# Patient Record
Sex: Male | Born: 1935 | Race: White | Hispanic: No | Smoking: Former smoker
Health system: Southern US, Community
[De-identification: ages and names within clinical notes are randomized; demographics above are authoritative.]

## PROBLEM LIST (undated history)

## (undated) DIAGNOSIS — J42 Unspecified chronic bronchitis: Secondary | ICD-10-CM

## (undated) DIAGNOSIS — Z9989 Dependence on other enabling machines and devices: Secondary | ICD-10-CM

## (undated) DIAGNOSIS — Z9981 Dependence on supplemental oxygen: Secondary | ICD-10-CM

## (undated) DIAGNOSIS — G4733 Obstructive sleep apnea (adult) (pediatric): Secondary | ICD-10-CM

## (undated) DIAGNOSIS — I4819 Other persistent atrial fibrillation: Secondary | ICD-10-CM

## (undated) DIAGNOSIS — I10 Essential (primary) hypertension: Secondary | ICD-10-CM

## (undated) DIAGNOSIS — E162 Hypoglycemia, unspecified: Secondary | ICD-10-CM

## (undated) DIAGNOSIS — Z950 Presence of cardiac pacemaker: Secondary | ICD-10-CM

## (undated) DIAGNOSIS — J189 Pneumonia, unspecified organism: Secondary | ICD-10-CM

## (undated) DIAGNOSIS — I509 Heart failure, unspecified: Secondary | ICD-10-CM

## (undated) DIAGNOSIS — R278 Other lack of coordination: Secondary | ICD-10-CM

## (undated) DIAGNOSIS — C679 Malignant neoplasm of bladder, unspecified: Secondary | ICD-10-CM

## (undated) HISTORY — DX: Hypoglycemia, unspecified: E16.2

## (undated) HISTORY — DX: Essential (primary) hypertension: I10

## (undated) HISTORY — DX: Other lack of coordination: R27.8

## (undated) HISTORY — DX: Heart failure, unspecified: I50.9

## (undated) HISTORY — PX: BACK SURGERY: SHX140

---

## 1985-11-14 HISTORY — PX: LUMBAR DISC SURGERY: SHX700

## 1998-07-19 ENCOUNTER — Inpatient Hospital Stay (HOSPITAL_COMMUNITY): Admission: EM | Admit: 1998-07-19 | Discharge: 1998-07-20 | Payer: Self-pay | Admitting: *Deleted

## 2001-05-28 ENCOUNTER — Ambulatory Visit (HOSPITAL_COMMUNITY): Admission: RE | Admit: 2001-05-28 | Discharge: 2001-05-28 | Payer: Self-pay | Admitting: Internal Medicine

## 2001-05-28 ENCOUNTER — Encounter: Payer: Self-pay | Admitting: Internal Medicine

## 2003-01-30 ENCOUNTER — Emergency Department (HOSPITAL_COMMUNITY): Admission: EM | Admit: 2003-01-30 | Discharge: 2003-01-30 | Payer: Self-pay | Admitting: Emergency Medicine

## 2003-01-30 ENCOUNTER — Encounter: Payer: Self-pay | Admitting: Emergency Medicine

## 2003-03-06 ENCOUNTER — Encounter: Payer: Self-pay | Admitting: Cardiology

## 2003-03-06 ENCOUNTER — Ambulatory Visit (HOSPITAL_COMMUNITY): Admission: RE | Admit: 2003-03-06 | Discharge: 2003-03-06 | Payer: Self-pay | Admitting: Cardiology

## 2003-03-10 ENCOUNTER — Encounter: Payer: Self-pay | Admitting: Family Medicine

## 2003-03-10 ENCOUNTER — Ambulatory Visit (HOSPITAL_COMMUNITY): Admission: RE | Admit: 2003-03-10 | Discharge: 2003-03-10 | Payer: Self-pay | Admitting: Family Medicine

## 2003-06-18 ENCOUNTER — Encounter: Payer: Self-pay | Admitting: Family Medicine

## 2003-06-18 ENCOUNTER — Ambulatory Visit (HOSPITAL_COMMUNITY): Admission: RE | Admit: 2003-06-18 | Discharge: 2003-06-18 | Payer: Self-pay | Admitting: Family Medicine

## 2003-06-30 ENCOUNTER — Inpatient Hospital Stay (HOSPITAL_COMMUNITY): Admission: EM | Admit: 2003-06-30 | Discharge: 2003-07-01 | Payer: Self-pay | Admitting: Emergency Medicine

## 2003-06-30 ENCOUNTER — Encounter: Payer: Self-pay | Admitting: Emergency Medicine

## 2003-07-30 ENCOUNTER — Ambulatory Visit (HOSPITAL_COMMUNITY): Admission: RE | Admit: 2003-07-30 | Discharge: 2003-07-30 | Payer: Self-pay | Admitting: Family Medicine

## 2003-07-30 ENCOUNTER — Encounter: Payer: Self-pay | Admitting: Family Medicine

## 2004-04-06 ENCOUNTER — Ambulatory Visit (HOSPITAL_COMMUNITY): Admission: RE | Admit: 2004-04-06 | Discharge: 2004-04-06 | Payer: Self-pay | Admitting: Internal Medicine

## 2004-05-14 ENCOUNTER — Emergency Department (HOSPITAL_COMMUNITY): Admission: EM | Admit: 2004-05-14 | Discharge: 2004-05-14 | Payer: Self-pay | Admitting: Emergency Medicine

## 2004-06-06 ENCOUNTER — Emergency Department (HOSPITAL_COMMUNITY): Admission: EM | Admit: 2004-06-06 | Discharge: 2004-06-06 | Payer: Self-pay | Admitting: Emergency Medicine

## 2004-09-20 ENCOUNTER — Ambulatory Visit: Payer: Self-pay | Admitting: *Deleted

## 2004-10-20 ENCOUNTER — Ambulatory Visit: Payer: Self-pay | Admitting: *Deleted

## 2004-11-19 ENCOUNTER — Ambulatory Visit: Payer: Self-pay | Admitting: *Deleted

## 2004-12-17 ENCOUNTER — Ambulatory Visit: Payer: Self-pay | Admitting: Internal Medicine

## 2005-01-21 ENCOUNTER — Ambulatory Visit: Payer: Self-pay | Admitting: *Deleted

## 2005-02-21 ENCOUNTER — Ambulatory Visit: Payer: Self-pay | Admitting: *Deleted

## 2005-03-21 ENCOUNTER — Ambulatory Visit: Payer: Self-pay | Admitting: *Deleted

## 2005-04-19 ENCOUNTER — Ambulatory Visit: Payer: Self-pay | Admitting: Cardiology

## 2005-05-13 ENCOUNTER — Ambulatory Visit: Payer: Self-pay | Admitting: Cardiovascular Disease

## 2005-06-22 ENCOUNTER — Ambulatory Visit: Payer: Self-pay | Admitting: *Deleted

## 2005-07-21 ENCOUNTER — Ambulatory Visit: Payer: Self-pay | Admitting: *Deleted

## 2005-07-29 ENCOUNTER — Ambulatory Visit (HOSPITAL_COMMUNITY): Admission: RE | Admit: 2005-07-29 | Discharge: 2005-07-29 | Payer: Self-pay | Admitting: Family Medicine

## 2005-08-18 ENCOUNTER — Ambulatory Visit: Payer: Self-pay | Admitting: *Deleted

## 2005-08-22 ENCOUNTER — Ambulatory Visit (HOSPITAL_COMMUNITY): Admission: RE | Admit: 2005-08-22 | Discharge: 2005-08-22 | Payer: Self-pay | Admitting: Family Medicine

## 2005-09-15 ENCOUNTER — Ambulatory Visit: Payer: Self-pay | Admitting: *Deleted

## 2005-09-19 ENCOUNTER — Ambulatory Visit: Payer: Self-pay | Admitting: Orthopedic Surgery

## 2005-10-17 ENCOUNTER — Ambulatory Visit: Payer: Self-pay | Admitting: Cardiology

## 2005-11-02 ENCOUNTER — Ambulatory Visit (HOSPITAL_COMMUNITY): Admission: RE | Admit: 2005-11-02 | Discharge: 2005-11-02 | Payer: Self-pay | Admitting: Family Medicine

## 2005-12-05 ENCOUNTER — Ambulatory Visit: Payer: Self-pay | Admitting: *Deleted

## 2005-12-13 ENCOUNTER — Emergency Department (HOSPITAL_COMMUNITY): Admission: EM | Admit: 2005-12-13 | Discharge: 2005-12-13 | Payer: Self-pay | Admitting: Emergency Medicine

## 2006-01-09 ENCOUNTER — Ambulatory Visit: Payer: Self-pay | Admitting: *Deleted

## 2006-02-06 ENCOUNTER — Ambulatory Visit: Payer: Self-pay | Admitting: *Deleted

## 2006-03-13 ENCOUNTER — Ambulatory Visit: Payer: Self-pay | Admitting: Cardiology

## 2006-04-14 ENCOUNTER — Ambulatory Visit: Payer: Self-pay | Admitting: *Deleted

## 2006-04-18 ENCOUNTER — Ambulatory Visit (HOSPITAL_COMMUNITY): Admission: RE | Admit: 2006-04-18 | Discharge: 2006-04-18 | Payer: Self-pay | Admitting: Cardiology

## 2006-04-19 ENCOUNTER — Ambulatory Visit: Payer: Self-pay | Admitting: Cardiology

## 2006-05-19 ENCOUNTER — Ambulatory Visit: Payer: Self-pay | Admitting: Cardiology

## 2006-06-21 ENCOUNTER — Ambulatory Visit: Payer: Self-pay | Admitting: *Deleted

## 2006-07-26 ENCOUNTER — Ambulatory Visit: Payer: Self-pay | Admitting: Cardiology

## 2006-08-10 ENCOUNTER — Ambulatory Visit: Payer: Self-pay | Admitting: Cardiology

## 2006-09-01 ENCOUNTER — Ambulatory Visit: Payer: Self-pay | Admitting: Cardiology

## 2006-10-03 ENCOUNTER — Ambulatory Visit: Payer: Self-pay | Admitting: Cardiology

## 2006-10-16 ENCOUNTER — Ambulatory Visit: Payer: Self-pay | Admitting: Cardiology

## 2006-11-03 ENCOUNTER — Ambulatory Visit: Payer: Self-pay | Admitting: Cardiology

## 2006-11-16 ENCOUNTER — Ambulatory Visit: Payer: Self-pay | Admitting: Cardiology

## 2006-12-18 ENCOUNTER — Ambulatory Visit: Payer: Self-pay | Admitting: Cardiology

## 2007-01-15 ENCOUNTER — Ambulatory Visit: Payer: Self-pay | Admitting: Cardiology

## 2007-01-23 ENCOUNTER — Ambulatory Visit: Payer: Self-pay | Admitting: Cardiology

## 2007-01-31 ENCOUNTER — Ambulatory Visit: Payer: Self-pay | Admitting: Cardiology

## 2007-02-07 ENCOUNTER — Ambulatory Visit: Payer: Self-pay | Admitting: Cardiology

## 2007-02-07 LAB — CONVERTED CEMR LAB
ALT: 18 units/L (ref 0–40)
AST: 23 units/L (ref 0–37)
Albumin: 3.6 g/dL (ref 3.5–5.2)
Calcium: 8.9 mg/dL (ref 8.4–10.5)
Chloride: 103 meq/L (ref 96–112)
Free T4: 0.9 ng/dL (ref 0.6–1.6)
GFR calc Af Amer: 76 mL/min
GFR calc non Af Amer: 63 mL/min
TSH: 2.56 microintl units/mL (ref 0.35–5.50)

## 2007-02-14 ENCOUNTER — Ambulatory Visit: Payer: Self-pay | Admitting: *Deleted

## 2007-02-23 ENCOUNTER — Ambulatory Visit: Payer: Self-pay | Admitting: Cardiology

## 2007-02-23 LAB — CONVERTED CEMR LAB
BUN: 16 mg/dL (ref 6–23)
CO2: 31 meq/L (ref 19–32)
Calcium: 8.8 mg/dL (ref 8.4–10.5)
Creatinine, Ser: 1.3 mg/dL (ref 0.4–1.5)
GFR calc Af Amer: 69 mL/min

## 2007-03-22 ENCOUNTER — Ambulatory Visit: Payer: Self-pay | Admitting: Cardiology

## 2007-04-11 ENCOUNTER — Ambulatory Visit: Payer: Self-pay | Admitting: Cardiology

## 2007-04-25 ENCOUNTER — Ambulatory Visit: Payer: Self-pay | Admitting: Cardiology

## 2007-05-30 ENCOUNTER — Ambulatory Visit: Payer: Self-pay | Admitting: Cardiology

## 2007-06-06 ENCOUNTER — Ambulatory Visit: Payer: Self-pay | Admitting: Cardiovascular Disease

## 2007-07-04 ENCOUNTER — Ambulatory Visit: Payer: Self-pay | Admitting: Cardiology

## 2007-07-17 ENCOUNTER — Ambulatory Visit: Payer: Self-pay | Admitting: Cardiovascular Disease

## 2007-08-07 ENCOUNTER — Ambulatory Visit: Payer: Self-pay | Admitting: Cardiology

## 2007-09-05 ENCOUNTER — Ambulatory Visit: Payer: Self-pay | Admitting: Cardiology

## 2007-09-07 ENCOUNTER — Ambulatory Visit: Payer: Self-pay | Admitting: Cardiology

## 2007-09-07 LAB — CONVERTED CEMR LAB
BUN: 16 mg/dL (ref 6–23)
Calcium: 8.9 mg/dL (ref 8.4–10.5)
Chloride: 105 meq/L (ref 96–112)
Free T4: 0.9 ng/dL (ref 0.6–1.6)
GFR calc Af Amer: 76 mL/min
GFR calc non Af Amer: 63 mL/min
Glucose, Bld: 81 mg/dL (ref 70–99)
TSH: 2.9 microintl units/mL (ref 0.35–5.50)

## 2007-09-19 ENCOUNTER — Ambulatory Visit: Payer: Self-pay | Admitting: Cardiology

## 2007-10-05 ENCOUNTER — Ambulatory Visit: Payer: Self-pay | Admitting: Cardiology

## 2007-11-02 ENCOUNTER — Ambulatory Visit: Payer: Self-pay | Admitting: Cardiology

## 2007-11-15 HISTORY — PX: EYE SURGERY: SHX253

## 2007-11-30 ENCOUNTER — Ambulatory Visit: Payer: Self-pay | Admitting: Internal Medicine

## 2007-12-21 ENCOUNTER — Ambulatory Visit: Payer: Self-pay | Admitting: Cardiology

## 2008-01-18 ENCOUNTER — Ambulatory Visit: Payer: Self-pay | Admitting: Cardiology

## 2008-02-19 ENCOUNTER — Ambulatory Visit: Payer: Self-pay | Admitting: Cardiology

## 2008-03-19 ENCOUNTER — Ambulatory Visit: Payer: Self-pay | Admitting: Cardiology

## 2008-04-16 ENCOUNTER — Ambulatory Visit: Payer: Self-pay | Admitting: Cardiology

## 2008-05-14 ENCOUNTER — Ambulatory Visit: Payer: Self-pay | Admitting: Cardiovascular Disease

## 2008-06-16 ENCOUNTER — Ambulatory Visit: Payer: Self-pay | Admitting: Cardiology

## 2008-07-11 ENCOUNTER — Ambulatory Visit: Payer: Self-pay | Admitting: Cardiology

## 2008-07-11 LAB — CONVERTED CEMR LAB: Prothrombin Time: 23 s — ABNORMAL HIGH (ref 10.9–13.3)

## 2008-08-18 ENCOUNTER — Ambulatory Visit: Payer: Self-pay | Admitting: Cardiology

## 2008-09-01 ENCOUNTER — Ambulatory Visit: Payer: Self-pay | Admitting: Cardiology

## 2008-09-01 ENCOUNTER — Ambulatory Visit (HOSPITAL_COMMUNITY): Admission: RE | Admit: 2008-09-01 | Discharge: 2008-09-01 | Payer: Self-pay | Admitting: Family Medicine

## 2008-10-02 ENCOUNTER — Ambulatory Visit: Payer: Self-pay | Admitting: Cardiology

## 2008-10-23 ENCOUNTER — Ambulatory Visit: Payer: Self-pay | Admitting: Cardiology

## 2008-11-14 DIAGNOSIS — C679 Malignant neoplasm of bladder, unspecified: Secondary | ICD-10-CM

## 2008-11-14 HISTORY — DX: Malignant neoplasm of bladder, unspecified: C67.9

## 2008-11-20 ENCOUNTER — Ambulatory Visit: Payer: Self-pay | Admitting: Cardiology

## 2009-01-02 ENCOUNTER — Ambulatory Visit: Payer: Self-pay | Admitting: Cardiology

## 2009-01-29 ENCOUNTER — Ambulatory Visit: Payer: Self-pay | Admitting: Cardiology

## 2009-02-26 ENCOUNTER — Ambulatory Visit: Payer: Self-pay | Admitting: Cardiology

## 2009-03-19 ENCOUNTER — Ambulatory Visit: Payer: Self-pay | Admitting: Cardiology

## 2009-04-15 ENCOUNTER — Ambulatory Visit: Payer: Self-pay | Admitting: Cardiology

## 2009-04-24 ENCOUNTER — Ambulatory Visit (HOSPITAL_COMMUNITY): Admission: RE | Admit: 2009-04-24 | Discharge: 2009-04-24 | Payer: Self-pay | Admitting: Family Medicine

## 2009-05-21 ENCOUNTER — Ambulatory Visit: Payer: Self-pay | Admitting: Cardiology

## 2009-06-24 ENCOUNTER — Ambulatory Visit: Payer: Self-pay | Admitting: Cardiology

## 2009-06-29 ENCOUNTER — Encounter: Payer: Self-pay | Admitting: *Deleted

## 2009-07-01 ENCOUNTER — Encounter: Payer: Self-pay | Admitting: Cardiology

## 2009-07-02 ENCOUNTER — Telehealth: Payer: Self-pay | Admitting: Cardiology

## 2009-07-02 ENCOUNTER — Ambulatory Visit: Payer: Self-pay | Admitting: Cardiology

## 2009-07-02 DIAGNOSIS — I4819 Other persistent atrial fibrillation: Secondary | ICD-10-CM | POA: Insufficient documentation

## 2009-07-02 DIAGNOSIS — E783 Hyperchylomicronemia: Secondary | ICD-10-CM | POA: Insufficient documentation

## 2009-07-02 DIAGNOSIS — E162 Hypoglycemia, unspecified: Secondary | ICD-10-CM | POA: Insufficient documentation

## 2009-07-02 DIAGNOSIS — I10 Essential (primary) hypertension: Secondary | ICD-10-CM | POA: Insufficient documentation

## 2009-07-06 LAB — CONVERTED CEMR LAB
AST: 19 units/L (ref 0–37)
Albumin: 3.8 g/dL (ref 3.5–5.2)
Alkaline Phosphatase: 54 units/L (ref 39–117)
BUN: 16 mg/dL (ref 6–23)
Basophils Absolute: 0 10*3/uL (ref 0.0–0.1)
CO2: 31 meq/L (ref 19–32)
Calcium: 9.1 mg/dL (ref 8.4–10.5)
Creatinine, Ser: 1.3 mg/dL (ref 0.4–1.5)
Eosinophils Absolute: 0.3 10*3/uL (ref 0.0–0.7)
GFR calc non Af Amer: 56.67 mL/min (ref >60)
Glucose, Bld: 84 mg/dL (ref 70–99)
Lymphocytes Relative: 25.7 % (ref 12.0–46.0)
MCHC: 33.5 g/dL (ref 30.0–36.0)
Monocytes Relative: 13.2 % — ABNORMAL HIGH (ref 3.0–12.0)
Neutrophils Relative %: 56.7 % (ref 43.0–77.0)
Platelets: 194 10*3/uL (ref 150.0–400.0)
RDW: 14 % (ref 11.5–14.6)
Sodium: 138 meq/L (ref 135–145)
Total Bilirubin: 1 mg/dL (ref 0.3–1.2)

## 2009-07-10 ENCOUNTER — Ambulatory Visit (HOSPITAL_BASED_OUTPATIENT_CLINIC_OR_DEPARTMENT_OTHER): Admission: RE | Admit: 2009-07-10 | Discharge: 2009-07-10 | Payer: Self-pay | Admitting: Urology

## 2009-07-10 ENCOUNTER — Encounter (INDEPENDENT_AMBULATORY_CARE_PROVIDER_SITE_OTHER): Payer: Self-pay | Admitting: Urology

## 2009-07-10 HISTORY — PX: TRANSURETHRAL RESECTION OF BLADDER TUMOR WITH GYRUS (TURBT-GYRUS): SHX6458

## 2009-07-17 ENCOUNTER — Inpatient Hospital Stay (HOSPITAL_COMMUNITY): Admission: EM | Admit: 2009-07-17 | Discharge: 2009-07-24 | Payer: Self-pay | Admitting: Emergency Medicine

## 2009-07-23 ENCOUNTER — Telehealth: Payer: Self-pay | Admitting: Cardiology

## 2009-07-27 ENCOUNTER — Ambulatory Visit: Payer: Self-pay

## 2009-07-27 LAB — CONVERTED CEMR LAB: POC INR: 2.7

## 2009-07-30 ENCOUNTER — Ambulatory Visit: Payer: Self-pay | Admitting: Cardiology

## 2009-07-30 LAB — CONVERTED CEMR LAB: POC INR: 2.2

## 2009-08-03 ENCOUNTER — Ambulatory Visit: Payer: Self-pay | Admitting: Cardiology

## 2009-08-03 LAB — CONVERTED CEMR LAB: POC INR: 1.6

## 2009-08-10 ENCOUNTER — Ambulatory Visit: Payer: Self-pay | Admitting: Cardiology

## 2009-08-17 ENCOUNTER — Ambulatory Visit: Payer: Self-pay | Admitting: Cardiology

## 2009-08-17 LAB — CONVERTED CEMR LAB: POC INR: 2.3

## 2009-09-07 ENCOUNTER — Ambulatory Visit: Payer: Self-pay | Admitting: Cardiology

## 2009-09-07 LAB — CONVERTED CEMR LAB: POC INR: 2.4

## 2009-09-21 ENCOUNTER — Ambulatory Visit: Payer: Self-pay | Admitting: Cardiology

## 2009-10-05 ENCOUNTER — Ambulatory Visit: Payer: Self-pay | Admitting: Cardiology

## 2009-10-16 ENCOUNTER — Encounter (INDEPENDENT_AMBULATORY_CARE_PROVIDER_SITE_OTHER): Payer: Self-pay | Admitting: *Deleted

## 2009-10-16 ENCOUNTER — Encounter: Payer: Self-pay | Admitting: Cardiology

## 2009-10-21 ENCOUNTER — Ambulatory Visit: Payer: Self-pay | Admitting: Cardiovascular Disease

## 2009-10-21 LAB — CONVERTED CEMR LAB: POC INR: 2.2

## 2009-11-06 ENCOUNTER — Emergency Department (HOSPITAL_COMMUNITY): Admission: EM | Admit: 2009-11-06 | Discharge: 2009-11-06 | Payer: Self-pay | Admitting: Emergency Medicine

## 2009-11-19 ENCOUNTER — Ambulatory Visit: Payer: Self-pay | Admitting: Cardiology

## 2009-11-19 LAB — CONVERTED CEMR LAB: POC INR: 2.3

## 2009-11-27 ENCOUNTER — Ambulatory Visit (HOSPITAL_BASED_OUTPATIENT_CLINIC_OR_DEPARTMENT_OTHER): Admission: RE | Admit: 2009-11-27 | Discharge: 2009-11-27 | Payer: Self-pay | Admitting: Urology

## 2009-12-07 ENCOUNTER — Ambulatory Visit: Payer: Self-pay | Admitting: Cardiology

## 2009-12-28 ENCOUNTER — Ambulatory Visit: Payer: Self-pay | Admitting: Cardiology

## 2010-01-25 ENCOUNTER — Ambulatory Visit: Payer: Self-pay | Admitting: Cardiology

## 2010-01-27 ENCOUNTER — Ambulatory Visit: Payer: Self-pay | Admitting: Cardiology

## 2010-01-28 ENCOUNTER — Encounter: Payer: Self-pay | Admitting: Cardiology

## 2010-02-01 ENCOUNTER — Encounter (INDEPENDENT_AMBULATORY_CARE_PROVIDER_SITE_OTHER): Payer: Self-pay | Admitting: *Deleted

## 2010-02-09 LAB — CONVERTED CEMR LAB
Albumin: 4.2 g/dL (ref 3.5–5.2)
Alkaline Phosphatase: 67 units/L (ref 39–117)
BUN: 17 mg/dL (ref 6–23)
Basophils Absolute: 0 10*3/uL (ref 0.0–0.1)
CO2: 25 meq/L (ref 19–32)
Calcium: 9.2 mg/dL (ref 8.4–10.5)
Eosinophils Relative: 3 % (ref 0–5)
Free T4: 1.11 ng/dL (ref 0.80–1.80)
Glucose, Bld: 105 mg/dL — ABNORMAL HIGH (ref 70–99)
HCT: 46.3 % (ref 39.0–52.0)
Hemoglobin: 15.4 g/dL (ref 13.0–17.0)
Lymphocytes Relative: 24 % (ref 12–46)
MCHC: 33.3 g/dL (ref 30.0–36.0)
Monocytes Absolute: 0.7 10*3/uL (ref 0.1–1.0)
Monocytes Relative: 10 % (ref 3–12)
Potassium: 4.3 meq/L (ref 3.5–5.3)
RBC: 5.35 M/uL (ref 4.22–5.81)
RDW: 16.4 % — ABNORMAL HIGH (ref 11.5–15.5)
Sodium: 137 meq/L (ref 135–145)
TSH: 2.385 microintl units/mL (ref 0.350–4.500)
Total Protein: 6.7 g/dL (ref 6.0–8.3)

## 2010-02-15 ENCOUNTER — Ambulatory Visit: Payer: Self-pay | Admitting: Cardiology

## 2010-02-15 LAB — CONVERTED CEMR LAB: POC INR: 2

## 2010-03-15 ENCOUNTER — Ambulatory Visit: Payer: Self-pay | Admitting: Cardiology

## 2010-04-14 ENCOUNTER — Ambulatory Visit: Payer: Self-pay | Admitting: Cardiology

## 2010-05-13 ENCOUNTER — Encounter (INDEPENDENT_AMBULATORY_CARE_PROVIDER_SITE_OTHER): Payer: Self-pay | Admitting: *Deleted

## 2010-05-20 ENCOUNTER — Ambulatory Visit: Payer: Self-pay | Admitting: Cardiology

## 2010-05-20 LAB — CONVERTED CEMR LAB: POC INR: 2.5

## 2010-06-10 ENCOUNTER — Encounter (INDEPENDENT_AMBULATORY_CARE_PROVIDER_SITE_OTHER): Payer: Self-pay

## 2010-06-10 LAB — CONVERTED CEMR LAB
ALT: 16 units/L
BUN: 16 mg/dL
Calcium: 9.1 mg/dL
Cholesterol: 179 mg/dL
Creatinine, Ser: 1.27 mg/dL
GFR calc non Af Amer: 55 mL/min
Glomerular Filtration Rate, Af Am: >60 mL/min/{1.73_m2}
Glucose, Bld: 119 mg/dL
HCT: 45.7 %
HDL: 46 mg/dL
MCV: 88.2 fL
Platelets: 272 10*3/uL
Potassium: 4.4 meq/L
Triglycerides: 75 mg/dL

## 2010-06-17 ENCOUNTER — Ambulatory Visit: Payer: Self-pay | Admitting: Cardiology

## 2010-06-17 LAB — CONVERTED CEMR LAB: POC INR: 2.6

## 2010-07-08 ENCOUNTER — Ambulatory Visit: Payer: Self-pay | Admitting: Cardiology

## 2010-07-15 ENCOUNTER — Ambulatory Visit: Payer: Self-pay | Admitting: Cardiology

## 2010-07-15 LAB — CONVERTED CEMR LAB: POC INR: 2.8

## 2010-08-12 ENCOUNTER — Ambulatory Visit: Payer: Self-pay | Admitting: Cardiology

## 2010-09-16 ENCOUNTER — Ambulatory Visit: Payer: Self-pay | Admitting: Cardiology

## 2010-09-16 LAB — CONVERTED CEMR LAB: POC INR: 2.6

## 2010-09-29 ENCOUNTER — Ambulatory Visit (HOSPITAL_COMMUNITY): Admission: RE | Admit: 2010-09-29 | Discharge: 2010-09-29 | Payer: Self-pay | Admitting: Internal Medicine

## 2010-10-14 ENCOUNTER — Ambulatory Visit: Payer: Self-pay | Admitting: Cardiology

## 2010-10-14 LAB — CONVERTED CEMR LAB: POC INR: 3.7

## 2010-10-22 ENCOUNTER — Emergency Department (HOSPITAL_COMMUNITY)
Admission: EM | Admit: 2010-10-22 | Discharge: 2010-10-22 | Disposition: A | Discharge status: Other Acute Inpatient Hospital | Payer: Self-pay | Source: Home / Self Care | Admitting: Emergency Medicine

## 2010-11-11 ENCOUNTER — Ambulatory Visit: Payer: Self-pay | Admitting: Cardiology

## 2010-11-11 LAB — CONVERTED CEMR LAB: POC INR: 2.6

## 2010-12-09 ENCOUNTER — Ambulatory Visit: Admission: RE | Admit: 2010-12-09 | Discharge: 2010-12-09 | Payer: Self-pay | Source: Home / Self Care

## 2010-12-09 LAB — CONVERTED CEMR LAB: POC INR: 2.4

## 2010-12-16 NOTE — Medication Information (Signed)
Summary: ccr-lr  Anticoagulant Therapy  Managed by: Vashti Hey, RN PCP: Estanislado Spire Supervising MD: Diona Browner MD, Remi Deter Indication 1: Atrial Fibrillation (ICD-427.31) Lab Used: [facility] [place] Site: [place] INR POC 2.6  Dietary changes: no    Health status changes: no    Bleeding/hemorrhagic complications: no    Recent/future hospitalizations: no    Any changes in medication regimen? no    Recent/future dental: no  Any missed doses?: no       Is patient compliant with meds? yes       Allergies: 1)  ! Lidocaine 2)  ! Penicillin 3)  ! * Tetanus 4)  ! * Decadron 5)  ! * Meprobamate 6)  ! * Decongestants  Anticoagulation Management History:      Positive risk factors for bleeding include an age of 40 years or older.  The bleeding index is 'intermediate risk'.  Positive CHADS2 values include History of HTN and Age > 22 years old.  The start date was 05/14/2004.  His last INR was 2.1 RATIO.  Anticoagulation responsible provider: Diona Browner MD, Remi Deter.  INR POC: 2.6.  Exp: 10/11.    Anticoagulation Management Assessment/Plan:      The patient's current anticoagulation dose is Coumadin 5 mg tabs: as directed by CVRR.  The target INR is 2 - 3.  The next INR is due 07/15/2010.  Anticoagulation instructions were given to patient.  Results were reviewed/authorized by Vashti Hey, RN.  He was notified by Vashti Hey RN.         Prior Anticoagulation Instructions: INR 2.5 Continue coumadin 2.5mg  once daily   Current Anticoagulation Instructions: INR 2.6 Continue coumadin 2.5mg  once daily

## 2010-12-16 NOTE — Letter (Signed)
Summary: [place] [facility], agricultural at [facility]  [address] [place] [address]   Phone: [phone]  Fax: [phone]      February 01, 2010 MRN: [id number]   SAHAS SLUKA [address] [place], [place]  [address]   Dear Mr. Remus,  Your test ordered by Selena Batten has been reviewed by your physician (or physician assistant) and was found to be normal or stable. Your physician (or physician assistant) felt no changes were needed at this time.  ____ Echocardiogram  ____ Cardiac Stress Test  __X__ Lab Work  ____ Peripheral vascular study of arms, legs or neck  ____ CT scan or X-ray  ____ Lung or Breathing test  ____ Other:  No change in medical treatment at this time, per Dr. wall.  Enclosed is a copy of your labwork for your records.   Thank you, Clearence Vitug Allyne Gee RN    Campbell Bing, MD, Lenise Arena.C.Gaylord Shih, MD, F.A.C.C Lewayne Bunting, MD, F.A.C.C Nona Dell, MD, F.A.C.C Charlton Haws, MD, Lenise Arena.C.C

## 2010-12-16 NOTE — Assessment & Plan Note (Signed)
Summary: 6 mth f/u per checkout on 01/27/10/tg      Allergies Added:   Visit Type:  Follow-up Primary Provider:  [name]  CC:  no cardiology complaints.  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 He denies any symptoms of clinically twisted. He continues on low dose amiodarone which he tolerated well.  Recent blood work has been reviewed and was within normal limits. I do not see where her thyroid function was checked though he said he had blood work done last week by Dr. Phillips Odor. He was hospitalized in May of this year with diverticulitis. Chest x-ray at that time demonstrated some chronic bronchitic changes but no infiltrates or effusion.  He denies any dyspnea cough or symptoms of thyroid disease. His weight has been slowly going up. He enjoys eating. His blood pressures and running around 130/80. His hemoglobin A1c has been relatively good at 6.1-6.3%.  He did not take his blood pressure medicine this morning. He checks it and says it runs about 130/60.  Current Medications (verified): 1)  Amiodarone Hcl 200 Mg Tabs (Amiodarone Hcl) .Marland Kitchen.. 1 Tab Once Daily 2)  Diovan 160 Mg Tabs (Valsartan) .Marland Kitchen.. 1 Tab Two Times A Day 3)  Aspirin Ec 325 Mg Tbec (Aspirin) .... Take One Tablet By Mouth Daily 4)  Coumadin 5 Mg Tabs (Warfarin Sodium) .... As Directed By Cvrr 5)  Lexapro 10 Mg Tabs (Escitalopram Oxalate) .Marland Kitchen.. 1 Tab Once Daily 6)  Centrum Silver  Tabs (Multiple Vitamins-Minerals) .Marland Kitchen.. 1 Tab Once Daily 7)  Vitamin D 1000 Unit Tabs (Cholecalciferol) .Marland Kitchen.. 1 Tab Two Times A Day 8)  Calcium-Magnesium 500-250 Mg Tabs (Calcium-Magnesium) .Marland Kitchen.. 1 Tab Once Daily 9)  Vitamin B-12 100 Mcg Tabs (Cyanocobalamin) .Marland Kitchen.. 1 Tab Once Daily 10)  Coq10 100 Mg Caps (Coenzyme Q10) .... Take 1 Tab Daily  Allergies (verified): 1)  ! Lidocaine 2)  ! Penicillin 3)  ! * Tetanus 4)  ! * Decadron 5)  ! * Meprobamate 6)  ! 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  He has one brother and six  sisters, none of whom have coronary artery disease.  Social History: Last updated: 07/28/09   He lives in [place] with his wife, he is retired, he is  married, has been for quite some time.  He used to smoke but quit 40 years  ago, has about a 15 pack-year smoking history, does not drink, does not use  illicit drugs.  Review of Systems       negative other than history of present illness  Vital Signs:  Patient profile:   77 year old male Weight:      256 pounds BMI:     36.86 Pulse rate:   74 / minute BP sitting:   160 / 82  (right arm)  Vitals Entered By: Dreama Saa, CNA (July 08, 2010 9:41 AM)  Physical Exam  General:  obese.  no acute distress Head:  normocephalic and atraumatic Eyes:  PERRLA/EOM intact; conjunctiva and lids normal. Neck:  Neck supple, no JVD. No masses, thyromegaly or abnormal cervical nodes. Chest Midas Daughety:  no deformities or breast masses noted  Lungs:  Clear bilaterally to auscultation and percussion. Heart:  PMI poorly appreciated, regular rate and rhythm, no gallop Pulses:  pulses normal in all 4 extremities Extremities:  trace left pedal edema and trace right pedal edema.   Neurologic:  Alert and oriented x 3. Skin:  Intact without lesions or rashes. Psych:  Normal affect.   Impression & Recommendations:  Problem # 1:  PAROXYSMAL ATRIAL FIBRILLATION (ICD-427.31) Assessment Improved he That is doing well on low dose amiodarone. I will see him back in 6 months. His updated medication  list for this problem includes:    Amiodarone Hcl 200 Mg Tabs (Amiodarone hcl) .Marland Kitchen... 1 tab once daily    Aspirin Ec 325 Mg Tbec (Aspirin) .Marland Kitchen... Take one tablet by mouth daily    Coumadin 5 Mg Tabs (Warfarin sodium) .Marland Kitchen... As directed by cvrr  Problem # 2:  HYPERTENSION (ICD-401.9) Assessment: Unchanged  His updated medication list for this problem includes:    Diovan 160 Mg Tabs (Valsartan) .Marland Kitchen... 1 tab two times a day    Aspirin Ec 325 Mg Tbec (Aspirin) .Marland Kitchen... Take one tablet by mouth daily  Orders: EKG w/ Interpretation (93000)  Problem # 3:  ENCOUNTER FOR LONG-TERM USE OF OTHER MEDICATIONS (ICD-V58.69)  Patient Instructions: 1)  Your physician recommends that you schedule a follow-up appointment in: 6 months 2)  Your physician recommends that you continue on your current medications as directed. Please refer to the Current Medication list given to you today.  Appended Document: [facility]      Allergies: 1)  ! Lidocaine 2)  ! Penicillin 3)  ! * Tetanus 4)  ! * Decadron 5)  ! * Meprobamate 6)  ! * Decongestants   EKG  Procedure date:  07/08/2010  Findings:      normal sinus rhythm, no acute changes, QTC 403 ms.

## 2010-12-16 NOTE — Medication Information (Signed)
Summary: ccr-lr  Anticoagulant Therapy  Managed by: Vashti Hey, RN PCP: [name] Supervising MD: Dietrich Pates MD, Molly Maduro Indication 1: Atrial Fibrillation (ICD-427.31) Lab Used: [facility] [place] Site: [place] INR POC 2.6  Dietary changes: no    Health status changes: no    Bleeding/hemorrhagic complications: no    Recent/future hospitalizations: no    Any changes in medication regimen? no    Recent/future dental: no  Any missed doses?: no       Is patient compliant with meds? yes       Allergies: 1)  ! Lidocaine 2)  ! Penicillin 3)  ! * Tetanus 4)  ! * Decadron 5)  ! * Meprobamate 6)  ! * Decongestants  Anticoagulation Management History:      The patient is taking warfarin and comes in today for a routine follow up visit.  Positive risk factors for bleeding include an age of 75 years or older.  The bleeding index is 'intermediate risk'.  Positive CHADS2 values include History of HTN and Age > 30 years old.  The start date was 05/14/2004.  His last INR was 2.1 RATIO.  Anticoagulation responsible provider: Dietrich Pates MD, Molly Maduro.  INR POC: 2.6.  Cuvette Lot#: [id number].  Exp: 10/11.    Anticoagulation Management Assessment/Plan:      The patient's current anticoagulation dose is Coumadin 5 mg tabs: as directed by CVRR.  The target INR is 2 - 3.  The next INR is due 10/14/2010.  Anticoagulation instructions were given to patient.  Results were reviewed/authorized by Vashti Hey, RN.  He was notified by Vashti Hey RN.         Prior Anticoagulation Instructions: INR 2.1 Continue coumadin 2.5mg  once daily   Current Anticoagulation Instructions: INR 2.6 Continue coumadin 2.5mg  once daily

## 2010-12-16 NOTE — Medication Information (Signed)
Summary: ccr-lr  Anticoagulant Therapy  Managed by: Vashti Hey, RN PCP: Estanislado Spire Supervising MD: Dietrich Pates MD, Molly Maduro Indication 1: Atrial Fibrillation (ICD-427.31) Lab Used: [facility] [place] Site: [place] INR POC 2.2  Dietary changes: no    Health status changes: no    Bleeding/hemorrhagic complications: no    Recent/future hospitalizations: no    Any changes in medication regimen? yes       Details: On antibiotic for sinus infection  Recent/future dental: no  Any missed doses?: no       Is patient compliant with meds? yes       Allergies: 1)  ! Lidocaine 2)  ! Penicillin 3)  ! * Tetanus 4)  ! * Decadron 5)  ! * Meprobamate 6)  ! * Decongestants  Anticoagulation Management History:      The patient is taking warfarin and comes in today for a routine follow up visit.  Positive risk factors for bleeding include an age of 29 years or older.  The bleeding index is 'intermediate risk'.  Positive CHADS2 values include History of HTN and Age > 61 years old.  The start date was 05/14/2004.  His last INR was 2.1 RATIO.  Anticoagulation responsible provider: Dietrich Pates MD, Molly Maduro.  INR POC: 2.2.  Cuvette Lot#: [id number].  Exp: 10/11.    Anticoagulation Management Assessment/Plan:      The patient's current anticoagulation dose is Coumadin 5 mg tabs: as directed by CVRR.  The target INR is 2 - 3.  The next INR is due 02/01/2010.  Anticoagulation instructions were given to patient.  Results were reviewed/authorized by Vashti Hey, RN.  He was notified by Vashti Hey RN.         Prior Anticoagulation Instructions: INR 2.5 Continue coumadn 2.5mg  once daily   Current Anticoagulation Instructions: INR 2.2 Continue coumadin 2.5mg  once daily

## 2010-12-16 NOTE — Medication Information (Signed)
Summary: ccr-lr  Anticoagulant Therapy  Managed by: Chad [name], Chad [name] PCP: [name] Supervising MD: Dietrich Pates MD, Molly Maduro Indication 1: Atrial Fibrillation (ICD-427.31) Lab Used: [facility] [place] Site: [place] INR POC 2.4  Dietary changes: no    Health status changes: no    Bleeding/hemorrhagic complications: no    Recent/future hospitalizations: no    Any changes in medication regimen? no    Recent/future dental: no  Any missed doses?: no       Is patient compliant with meds? yes       Allergies: 1)  ! Lidocaine 2)  ! Penicillin 3)  ! * Tetanus 4)  ! * Decadron 5)  ! * Meprobamate 6)  ! * Decongestants  Anticoagulation Management History:      The patient is taking warfarin and comes in today for a routine follow up visit.  Positive risk factors for bleeding include an age of 15 years or older.  The bleeding index is 'intermediate risk'.  Positive CHADS2 values include History of HTN and Age > 72 years old.  The start date was 05/14/2004.  His last INR was 2.1 RATIO.  Anticoagulation responsible provider: Dietrich Pates MD, Molly Maduro.  INR POC: 2.4.  Cuvette Lot#: [id number].  Exp: 10/11.    Anticoagulation Management Assessment/Plan:      The patient's current anticoagulation dose is Coumadin 5 mg tabs: as directed by CVRR.  The target INR is 2 - 3.  The next INR is due 01/06/2011.  Anticoagulation instructions were given to patient.  Results were reviewed/authorized by Chad [name], Chad [name].  He was notified by Chad [name] Chad [name].         Prior Anticoagulation Instructions: INR 2.6 Continue coumadin 2.5mg  once daily   Current Anticoagulation Instructions: INR 2.4 Continue coumadin 2.5mg  once daily

## 2010-12-16 NOTE — Medication Information (Signed)
Summary: ccr-lr  Anticoagulant Therapy  Managed by: Vashti Hey, RN PCP: Estanislado Spire Supervising MD: Dietrich Pates MD, Molly Maduro Indication 1: Atrial Fibrillation (ICD-427.31) Lab Used: [facility] [place] Site: [place] INR POC 2.5  Dietary changes: no    Health status changes: no    Bleeding/hemorrhagic complications: no    Recent/future hospitalizations: no    Any changes in medication regimen? no    Recent/future dental: no  Any missed doses?: no       Is patient compliant with meds? yes       Allergies: 1)  ! Lidocaine 2)  ! Penicillin 3)  ! * Tetanus 4)  ! * Decadron 5)  ! * Meprobamate 6)  ! * Decongestants  Anticoagulation Management History:      The patient is taking warfarin and comes in today for a routine follow up visit.  Positive risk factors for bleeding include an age of 75 years or older.  The bleeding index is 'intermediate risk'.  Positive CHADS2 values include History of HTN and Age > 24 years old.  The start date was 05/14/2004.  His last INR was 2.1 RATIO.  Anticoagulation responsible provider: Dietrich Pates MD, Molly Maduro.  INR POC: 2.5.  Cuvette Lot#: [id number].  Exp: 10/11.    Anticoagulation Management Assessment/Plan:      The patient's current anticoagulation dose is Coumadin 5 mg tabs: as directed by CVRR.  The target INR is 2 - 3.  The next INR is due 04/14/2010.  Anticoagulation instructions were given to patient.  Results were reviewed/authorized by Vashti Hey, RN.  He was notified by Vashti Hey RN.         Prior Anticoagulation Instructions: INR 2.0 Take coumadin 1 tablet tonight then resume 1/2 tablet once daily   Current Anticoagulation Instructions: INR 2.5 Continue coumadin 2.5mg  once daily

## 2010-12-16 NOTE — Medication Information (Signed)
Summary: ccr-lr  Anticoagulant Therapy  Managed by: Vashti Hey, RN PCP: Estanislado Spire Supervising MD: Dietrich Pates MD, Molly Maduro Indication 1: Atrial Fibrillation (ICD-427.31) Lab Used: [facility]  Site: [place] INR POC 1.6  Dietary changes: no    Health status changes: no    Bleeding/hemorrhagic complications: no    Recent/future hospitalizations: no    Any changes in medication regimen? no    Recent/future dental: no  Any missed doses?: yes     Details: was off coumadin 10 days for bladder Bx  Is patient compliant with meds? yes       Allergies: 1)  ! Lidocaine 2)  ! Penicillin 3)  ! * Tetanus 4)  ! * Decadron 5)  ! * Meprobamate 6)  ! * Decongestants  Anticoagulation Management History:      The patient is taking warfarin and comes in today for a routine follow up visit.  Positive risk factors for bleeding include an age of 75 years or older.  The bleeding index is 'intermediate risk'.  Positive CHADS2 values include History of HTN and Age > 37 years old.  The start date was 05/14/2004.  His last INR was 2.1 RATIO.  Anticoagulation responsible provider: Dietrich Pates MD, Molly Maduro.  INR POC: 1.6.  Cuvette Lot#: [id number].  Exp: 10/11.    Anticoagulation Management Assessment/Plan:      The patient's current anticoagulation dose is Coumadin 5 mg tabs: as directed by CVRR.  The target INR is 2 - 3.  The next INR is due 12/23/2009.  Anticoagulation instructions were given to patient.  Results were reviewed/authorized by Vashti Hey, RN.  He was notified by Vashti Hey RN.         Prior Anticoagulation Instructions: INR 2.3  Continue coumadin 2.5mg  once daily Pt will be stopping coumadin 5 days before bladder Bx on 11/27/09 Resume coumadin after procedure at above dose.  Current Anticoagulation Instructions: INR 1.6 Take coumadin 1 tablet tonight then resume 1/2 tablet once daily

## 2010-12-16 NOTE — Assessment & Plan Note (Signed)
Summary: Chad [name]  Medications Added COQ10 100 MG CAPS (COENZYME Q10) take 1 tab daily      Allergies Added:   Visit Type:  Follow-up  CC:  no cardiology complaints .  Current Medications (verified): 1)  Amiodarone Hcl 200 Mg Tabs (Amiodarone Hcl) .Marland Kitchen.. 1 Tab Once Daily 2)  Diovan 160 Mg Tabs (Valsartan) .Marland Kitchen.. 1 Tab Two Times A Day 3)  Aspirin Ec 325 Mg Tbec (Aspirin) .... Take One Tablet By Mouth Daily 4)  Coumadin 5 Mg Tabs (Warfarin Sodium) .... As Directed By Cvrr 5)  Lexapro 10 Mg Tabs (Escitalopram Oxalate) .Marland Kitchen.. 1 Tab Once Daily 6)  Centrum Silver  Tabs (Multiple Vitamins-Minerals) .Marland Kitchen.. 1 Tab Once Daily 7)  Vitamin D 1000 Unit Tabs (Cholecalciferol) .Marland Kitchen.. 1 Tab Two Times A Day 8)  Calcium-Magnesium 500-250 Mg Tabs (Calcium-Magnesium) .Marland Kitchen.. 1 Tab Once Daily 9)  Vitamin B-12 100 Mcg Tabs (Cyanocobalamin) .Marland Kitchen.. 1 Tab Once Daily 10)  Coq10 100 Mg Caps (Coenzyme Q10) .... Take 1 Tab Daily  Allergies (verified): 1)  ! Lidocaine 2)  ! Penicillin 3)  ! * Tetanus 4)  ! * Decadron 5)  ! * Meprobamate 6)  ! * Decongestants  Vital Signs:  Patient profile:   77 year old male Weight:      258 pounds Pulse rate:   63 / minute BP sitting:   152 / 79  (right arm)  Vitals Entered By: Dreama Saa, CNA (January 27, 2010 1:59 PM)   Other Orders: T-Comprehensive Metabolic Panel [phone]) T-TSH [phone]) T-T4, Free [phone]) T-CBC w/Diff [phone]) T- * Misc. Laboratory test [phone])  Patient Instructions: 1)  Your physician recommends that you schedule a follow-up appointment in: 6 months 2)  Your physician recommends that you return for lab work in: Today 3)  Your physician recommends that you continue on your current medications as directed. Please refer to the Current Medication list given to you today.  Appended Document: [facility]      Primary Provider:  Estanislado Spire   History of Present Illness: Chad [name] returns for E and M of his Paroxysmal Atrial  Fibrillation.  Other than fatigue he has no cardiac complaints. Specifically no palpitations, SOB,DOE out of the ordinary, chest pain, or peripheral edema. He needs bloowwork for his Amiodarone today.   Allergies: 1)  ! Lidocaine 2)  ! Penicillin 3)  ! * Tetanus 4)  ! * Decadron 5)  ! * Meprobamate 6)  ! * Decongestants  Past History:  Past Surgical History: Last updated: 07/02/2009 Discectomy 20 yrs ago  Review of Systems       Negative other than HPI.   Impression & Recommendations:  Problem # 1:  PAROXYSMAL ATRIAL FIBRILLATION (ICD-427.31) Assessment Unchanged  His updated medication list for this problem includes:    Amiodarone Hcl 200 Mg Tabs (Amiodarone hcl) .Marland Kitchen... 1 tab once daily    Aspirin Ec 325 Mg Tbec (Aspirin) .Marland Kitchen... Take one tablet by mouth daily    Coumadin 5 Mg Tabs (Warfarin sodium) .Marland Kitchen... As directed by cvrr  Problem # 2:  ENCOUNTER FOR LONG-TERM USE OF OTHER MEDICATIONS (ICD-V58.69) Assessment: Unchanged Will obtain CBC, CMET, and TSH.  Problem # 3:  HYPERTENSION (ICD-401.9) Assessment: Improved  His updated medication list for this problem includes:    Diovan 160 Mg Tabs (Valsartan) .Marland Kitchen... 1 tab two times a day    Aspirin Ec 325 Mg Tbec (Aspirin) .Marland Kitchen... Take one tablet by mouth daily

## 2010-12-16 NOTE — Letter (Signed)
Summary: Appointment - Missed  [facility] [place]  [address] [place] [address]   Phone: [phone]  Fax: [phone]     May 13, 2010 MRN: [id number]   SHERYL TOWELL [address] [place], [place]  [address]   Dear Mr. LABELL,  Our records indicate you missed your appointment on       05/13/10 [facility]        It is very important that we reach you to reschedule this appointment. We look forward to participating in your health care needs. Please contact us at the number listed above at your earliest convenience to reschedule this appointment.     Sincerely,    Glass blower/designer

## 2010-12-16 NOTE — Medication Information (Signed)
Summary: ccr-lr  Anticoagulant Therapy  Managed by: Vashti Hey, RN PCP: Estanislado Spire Supervising MD: Dietrich Pates MD, Molly Maduro Indication 1: Atrial Fibrillation (ICD-427.31) Lab Used: [facility] [place] Site: [place] INR POC 2.5  Dietary changes: no    Health status changes: no    Bleeding/hemorrhagic complications: no    Recent/future hospitalizations: no    Any changes in medication regimen? no    Recent/future dental: no  Any missed doses?: no       Is patient compliant with meds? yes       Allergies: 1)  ! Lidocaine 2)  ! Penicillin 3)  ! * Tetanus 4)  ! * Decadron 5)  ! * Meprobamate 6)  ! * Decongestants  Anticoagulation Management History:      The patient is taking warfarin and comes in today for a routine follow up visit.  Positive risk factors for bleeding include an age of 75 years or older.  The bleeding index is 'intermediate risk'.  Positive CHADS2 values include History of HTN and Age > 41 years old.  The start date was 05/14/2004.  His last INR was 2.1 RATIO.  Anticoagulation responsible provider: Dietrich Pates MD, Molly Maduro.  INR POC: 2.5.  Cuvette Lot#: [id number].  Exp: 10/11.    Anticoagulation Management Assessment/Plan:      The patient's current anticoagulation dose is Coumadin 5 mg tabs: as directed by CVRR.  The target INR is 2 - 3.  The next INR is due 01/25/2010.  Anticoagulation instructions were given to patient.  Results were reviewed/authorized by Vashti Hey, RN.  He was notified by Vashti Hey RN.         Prior Anticoagulation Instructions: INR 1.6 Take coumadin 1 tablet tonight then resume 1/2 tablet once daily   Current Anticoagulation Instructions: INR 2.5 Continue coumadn 2.5mg  once daily

## 2010-12-16 NOTE — Medication Information (Signed)
Summary: CCR  Anticoagulant Therapy  Managed by: Vashti Hey, RN PCP: Estanislado Spire Supervising MD: Dietrich Pates MD, Molly Maduro Indication 1: Atrial Fibrillation (ICD-427.31) Lab Used: [name] Site: [place] INR POC 2.3  Dietary changes: no    Health status changes: no    Bleeding/hemorrhagic complications: no    Recent/future hospitalizations: yes       Details: scheduled for bladder Bx on 11/27/09  Will be stopping coumadin 5 days before  Any changes in medication regimen? no    Recent/future dental: no  Any missed doses?: no       Is patient compliant with meds? yes       Allergies: 1)  ! Lidocaine 2)  ! Penicillin 3)  ! * Tetanus 4)  ! * Decadron 5)  ! * Meprobamate 6)  ! * Decongestants  Anticoagulation Management History:      The patient is taking warfarin and comes in today for a routine follow up visit.  Positive risk factors for bleeding include an age of 75 years or older.  The bleeding index is 'intermediate risk'.  Positive CHADS2 values include History of HTN and Age > 36 years old.  The start date was 05/14/2004.  His last INR was 2.1 RATIO.  Anticoagulation responsible provider: Dietrich Pates MD, Molly Maduro.  INR POC: 2.3.  Cuvette Lot#: [id number].  Exp: 10/11.    Anticoagulation Management Assessment/Plan:      The patient's current anticoagulation dose is Coumadin 5 mg tabs: as directed by CVRR.  The target INR is 2 - 3.  The next INR is due 12/07/2009.  Anticoagulation instructions were given to patient.  Results were reviewed/authorized by Vashti Hey, RN.  He was notified by Vashti Hey RN.         Prior Anticoagulation Instructions: INR 2.2 Continue coumadin 2.5mg  once daily   Current Anticoagulation Instructions: INR 2.3  Continue coumadin 2.5mg  once daily Pt will be stopping coumadin 5 days before bladder Bx on 11/27/09 Resume coumadin after procedure at above dose.

## 2010-12-16 NOTE — Medication Information (Signed)
Summary: ccr-lr  Anticoagulant Therapy  Managed by: Vashti Hey, RN PCP: [name] Supervising MD: Dietrich Pates MD, Molly Maduro Indication 1: Atrial Fibrillation (ICD-427.31) Lab Used: [facility] [place] Site: [place] INR POC 2.8  Dietary changes: no    Health status changes: no    Bleeding/hemorrhagic complications: no    Recent/future hospitalizations: no    Any changes in medication regimen? no    Recent/future dental: no  Any missed doses?: no       Is patient compliant with meds? yes       Allergies: 1)  ! Lidocaine 2)  ! Penicillin 3)  ! * Tetanus 4)  ! * Decadron 5)  ! * Meprobamate 6)  ! * Decongestants  Anticoagulation Management History:      The patient is taking warfarin and comes in today for a routine follow up visit.  Positive risk factors for bleeding include an age of 75 years or older.  The bleeding index is 'intermediate risk'.  Positive CHADS2 values include History of HTN and Age > 20 years old.  The start date was 05/14/2004.  His last INR was 2.1 RATIO.  Anticoagulation responsible Zoi Devine: Dietrich Pates MD, Molly Maduro.  INR POC: 2.8.  Cuvette Lot#: [id number].  Exp: 10/11.    Anticoagulation Management Assessment/Plan:      The patient's current anticoagulation dose is Coumadin 5 mg tabs: as directed by CVRR.  The target INR is 2 - 3.  The next INR is due 08/12/2010.  Anticoagulation instructions were given to patient.  Results were reviewed/authorized by Vashti Hey, RN.  He was notified by Vashti Hey RN.         Prior Anticoagulation Instructions: INR 2.6 Continue coumadin 2.5mg  once daily   Current Anticoagulation Instructions: INR 2.8 Continue coumadin 2.5mg  once daily

## 2010-12-16 NOTE — Letter (Signed)
Summary: [place] [employer] [facility], agricultural at [facility]  [address] [place] [address]   Phone: [phone]  Fax: [phone]      February 01, 2010 MRN: [id number]   [name] [address] [place], [place]  [address]   Dear Mr. Froman,  Your test ordered by Selena Batten has been reviewed by your physician (or physician assistant) and was found to be normal or stable. Your physician (or physician assistant) felt no changes were needed at this time.  ____ Echocardiogram  ____ Cardiac Stress Test  _x___ Lab Work  ____ Peripheral vascular study of arms, legs or neck  ____ CT scan or X-ray  ____ Lung or Breathing test  ____ Other:  No change in medical treatment at this time, per Dr. Daleen Squibb.  Enclosed is a copy of your labwork for your records.  Thank you, [name] RN    [place] Bing, MD, Lenise Arena.C.Gaylord Shih, MD, F.A.C.C Lewayne Bunting, MD, F.A.C.C Nona Dell, MD, F.A.C.C Charlton Haws, MD, Lenise Arena.C.C

## 2010-12-16 NOTE — Medication Information (Signed)
Summary: protime/tg  Anticoagulant Therapy  Managed by: Vashti Hey, RN PCP: Estanislado Spire Supervising MD: Diona Browner MD, Remi Deter Indication 1: Atrial Fibrillation (ICD-427.31) Lab Used: [facility] [place] Site: [place] INR POC 2.5  Dietary changes: no    Health status changes: no    Bleeding/hemorrhagic complications: no    Recent/future hospitalizations: no    Any changes in medication regimen? no    Recent/future dental: no  Any missed doses?: no       Is patient compliant with meds? yes       Allergies: 1)  ! Lidocaine 2)  ! Penicillin 3)  ! * Tetanus 4)  ! * Decadron 5)  ! * Meprobamate 6)  ! * Decongestants  Anticoagulation Management History:      The patient is taking warfarin and comes in today for a routine follow up visit.  Positive risk factors for bleeding include an age of 47 years or older.  The bleeding index is 'intermediate risk'.  Positive CHADS2 values include History of HTN and Age > 69 years old.  The start date was 05/14/2004.  His last INR was 2.1 RATIO.  Anticoagulation responsible provider: Diona Browner MD, Remi Deter.  INR POC: 2.5.  Cuvette Lot#: [id number].  Exp: 10/11.    Anticoagulation Management Assessment/Plan:      The patient's current anticoagulation dose is Coumadin 5 mg tabs: as directed by CVRR.  The target INR is 2 - 3.  The next INR is due 06/17/2010.  Anticoagulation instructions were given to patient.  Results were reviewed/authorized by Vashti Hey, RN.  He was notified by Vashti Hey RN.         Prior Anticoagulation Instructions: INR 2.2 Continue coumadin 2.5mg  once daily   Current Anticoagulation Instructions: INR 2.5 Continue coumadin 2.5mg  once daily

## 2010-12-16 NOTE — Medication Information (Signed)
Summary: ccr-lr  Anticoagulant Therapy  Managed by: Vashti Hey, RN PCP: [name] Supervising MD: Dietrich Pates MD, Molly Maduro Indication 1: Atrial Fibrillation (ICD-427.31) Lab Used: [facility] [place] Site: [place] INR POC 2.6  Dietary changes: no    Health status changes: no    Bleeding/hemorrhagic complications: no    Recent/future hospitalizations: no    Any changes in medication regimen? no    Recent/future dental: no  Any missed doses?: no       Is patient compliant with meds? yes       Allergies: 1)  ! Lidocaine 2)  ! Penicillin 3)  ! * Tetanus 4)  ! * Decadron 5)  ! * Meprobamate 6)  ! * Decongestants  Anticoagulation Management History:      The patient is taking warfarin and comes in today for a routine follow up visit.  Positive risk factors for bleeding include an age of 75 years or older.  The bleeding index is 'intermediate risk'.  Positive CHADS2 values include History of HTN and Age > 76 years old.  The start date was 05/14/2004.  His last INR was 2.1 RATIO.  Anticoagulation responsible provider: Dietrich Pates MD, Molly Maduro.  INR POC: 2.6.  Cuvette Lot#: [id number].  Exp: 10/11.    Anticoagulation Management Assessment/Plan:      The patient's current anticoagulation dose is Coumadin 5 mg tabs: as directed by CVRR.  The target INR is 2 - 3.  The next INR is due 12/09/2010.  Anticoagulation instructions were given to patient.  Results were reviewed/authorized by Vashti Hey, RN.  He was notified by Vashti Hey RN.         Prior Anticoagulation Instructions: INR 3.7 Hold coumadin tonight then resume 2.5mg  once daily   Current Anticoagulation Instructions: INR 2.6 Continue coumadin 2.5mg  once daily

## 2010-12-16 NOTE — Miscellaneous (Signed)
Summary: CMP, CBC and Lipid's  Clinical Lists Changes  Observations: Added new observation of CALCIUM: 9.1 mg/dL (16/08/9603 54:09) Added new observation of ALBUMIN: 4.0 g/dL (81/19/1478 29:56) Added new observation of PROTEIN, TOT: 6.5 g/dL (21/30/8657 84:69) Added new observation of SGPT (ALT): 16 units/L (06/10/2010 16:17) Added new observation of SGOT (AST): 18 units/L (06/10/2010 16:17) Added new observation of ALK PHOS: 70 units/L (06/10/2010 16:17) Added new observation of BILI DIRECT: BILI Total: 0.5 mg/dL (62/95/2841 32:44) Added new observation of GFR AA: >60 mL/min/1.65m2 (06/10/2010 16:17) Added new observation of GFR: 55 mL/min (06/10/2010 16:17) Added new observation of CREATININE: 1.27 mg/dL (11/16/7251 66:44) Added new observation of BUN: 16 mg/dL (03/47/4259 56:38) Added new observation of BG RANDOM: 119 mg/dL (75/64/3329 51:88) Added new observation of CO2 PLSM/SER: 26 meq/L (06/10/2010 16:17) Added new observation of CL SERUM: 104 meq/L (06/10/2010 16:17) Added new observation of K SERUM: 4.4 meq/L (06/10/2010 16:17) Added new observation of NA: 140 meq/L (06/10/2010 16:17) Added new observation of LDL: 118 mg/dL (41/66/0630 16:01) Added new observation of HDL: 46 mg/dL (09/32/3557 32:20) Added new observation of TRIGLYC TOT: 75 mg/dL (25/42/7062 37:62) Added new observation of CHOLESTEROL: 179 mg/dL (83/15/1761 60:73) Added new observation of PLATELETK/UL: 272 K/uL (06/10/2010 16:17) Added new observation of MCV: 88.2 fL (06/10/2010 16:17) Added new observation of HCT: 45.7 % (06/10/2010 16:17) Added new observation of HGB: 15.0 g/dL (71/04/2693 85:46) Added new observation of WBC COUNT: 7.6 10*3/microliter (06/10/2010 16:17)

## 2010-12-16 NOTE — Medication Information (Signed)
Summary: ccr-lr  Anticoagulant Therapy  Managed by: Vashti Hey, RN PCP: [name] Supervising MD: Dietrich Pates MD, Molly Maduro Indication 1: Atrial Fibrillation (ICD-427.31) Lab Used: [facility] [place] Site: [place] INR POC 2.1  Dietary changes: no    Health status changes: no    Bleeding/hemorrhagic complications: no    Recent/future hospitalizations: no    Any changes in medication regimen? no    Recent/future dental: no  Any missed doses?: no       Is patient compliant with meds? yes       Allergies: 1)  ! Lidocaine 2)  ! Penicillin 3)  ! * Tetanus 4)  ! * Decadron 5)  ! * Meprobamate 6)  ! * Decongestants  Anticoagulation Management History:      The patient is taking warfarin and comes in today for a routine follow up visit.  Positive risk factors for bleeding include an age of 75 years or older.  The bleeding index is 'intermediate risk'.  Positive CHADS2 values include History of HTN and Age > 75 years old.  The start date was 05/14/2004.  His last INR was 2.1 RATIO.  Anticoagulation responsible Garnett Rekowski: Dietrich Pates MD, Molly Maduro.  INR POC: 2.1.  Cuvette Lot#: [id number].  Exp: 10/11.    Anticoagulation Management Assessment/Plan:      The patient's current anticoagulation dose is Coumadin 5 mg tabs: as directed by CVRR.  The target INR is 2 - 3.  The next INR is due 09/09/2010.  Anticoagulation instructions were given to patient.  Results were reviewed/authorized by Vashti Hey, RN.  He was notified by Vashti Hey RN.         Prior Anticoagulation Instructions: INR 2.8 Continue coumadin 2.5mg  once daily   Current Anticoagulation Instructions: INR 2.1 Continue coumadin 2.5mg  once daily

## 2010-12-16 NOTE — Medication Information (Signed)
Summary: ccr-lr  Anticoagulant Therapy  Managed by: Vashti Hey, RN PCP: Estanislado Spire Supervising MD: Dietrich Pates MD, Molly Maduro Indication 1: Atrial Fibrillation (ICD-427.31) Lab Used: [facility] [place] Site: [place] INR POC 2.0  Dietary changes: no    Health status changes: no    Bleeding/hemorrhagic complications: no    Recent/future hospitalizations: no    Any changes in medication regimen? no    Recent/future dental: no  Any missed doses?: no       Is patient compliant with meds? yes       Allergies: 1)  ! Lidocaine 2)  ! Penicillin 3)  ! * Tetanus 4)  ! * Decadron 5)  ! * Meprobamate 6)  ! * Decongestants  Anticoagulation Management History:      The patient is taking warfarin and comes in today for a routine follow up visit.  Positive risk factors for bleeding include an age of 51 years or older.  The bleeding index is 'intermediate risk'.  Positive CHADS2 values include History of HTN and Age > 9 years old.  The start date was 05/14/2004.  His last INR was 2.1 RATIO.  Anticoagulation responsible provider: Dietrich Pates MD, Molly Maduro.  INR POC: 2.0.  Cuvette Lot#: [id number].  Exp: 10/11.    Anticoagulation Management Assessment/Plan:      The patient's current anticoagulation dose is Coumadin 5 mg tabs: as directed by CVRR.  The target INR is 2 - 3.  The next INR is due 03/15/2010.  Anticoagulation instructions were given to patient.  Results were reviewed/authorized by Vashti Hey, RN.  He was notified by Vashti Hey RN.         Prior Anticoagulation Instructions: INR 2.2 Continue coumadin 2.5mg  once daily   Current Anticoagulation Instructions: INR 2.0 Take coumadin 1 tablet tonight then resume 1/2 tablet once daily

## 2010-12-16 NOTE — Medication Information (Signed)
Summary: ccr-lr  Anticoagulant Therapy  Managed by: Vashti Hey, RN PCP: [name] Supervising MD: Daleen Squibb MD, Maisie Fus Indication 1: Atrial Fibrillation (ICD-427.31) Lab Used: [facility] [place] Site: [place] INR POC 3.7  Dietary changes: no    Health status changes: yes       Details: had diverticulitis  Bleeding/hemorrhagic complications: no    Recent/future hospitalizations: no    Any changes in medication regimen? yes       Details: has been of sulfa drug and abx   finished now  Recent/future dental: no  Any missed doses?: no       Is patient compliant with meds? yes       Allergies: 1)  ! Lidocaine 2)  ! Penicillin 3)  ! * Tetanus 4)  ! * Decadron 5)  ! * Meprobamate 6)  ! * Decongestants  Anticoagulation Management History:      The patient is taking warfarin and comes in today for a routine follow up visit.  Positive risk factors for bleeding include an age of 75 years or older.  The bleeding index is 'intermediate risk'.  Positive CHADS2 values include History of HTN and Age > 15 years old.  The start date was 05/14/2004.  His last INR was 2.1 RATIO.  Anticoagulation responsible provider: Daleen Squibb MD, Maisie Fus.  INR POC: 3.7.  Cuvette Lot#: [id number].  Exp: 10/11.    Anticoagulation Management Assessment/Plan:      The patient's current anticoagulation dose is Coumadin 5 mg tabs: as directed by CVRR.  The target INR is 2 - 3.  The next INR is due 11/11/2010.  Anticoagulation instructions were given to patient.  Results were reviewed/authorized by Vashti Hey, RN.  He was notified by Vashti Hey RN.         Prior Anticoagulation Instructions: INR 2.6 Continue coumadin 2.5mg  once daily   Current Anticoagulation Instructions: INR 3.7 Hold coumadin tonight then resume 2.5mg  once daily

## 2010-12-16 NOTE — Medication Information (Signed)
Summary: ccr-lr  Anticoagulant Therapy  Managed by: Vashti Hey, RN PCP: Estanislado Spire Supervising MD: Dietrich Pates MD, Molly Maduro Indication 1: Atrial Fibrillation (ICD-427.31) Lab Used: [facility] [place] Site: [place] INR POC 2.2  Dietary changes: no    Health status changes: no    Bleeding/hemorrhagic complications: no    Recent/future hospitalizations: no    Any changes in medication regimen? no    Recent/future dental: no  Any missed doses?: no       Is patient compliant with meds? yes       Allergies: 1)  ! Lidocaine 2)  ! Penicillin 3)  ! * Tetanus 4)  ! * Decadron 5)  ! * Meprobamate 6)  ! * Decongestants  Anticoagulation Management History:      The patient is taking warfarin and comes in today for a routine follow up visit.  Positive risk factors for bleeding include an age of 75 years or older.  The bleeding index is 'intermediate risk'.  Positive CHADS2 values include History of HTN and Age > 4 years old.  The start date was 05/14/2004.  His last INR was 2.1 RATIO.  Anticoagulation responsible provider: Dietrich Pates MD, Molly Maduro.  INR POC: 2.2.  Cuvette Lot#: [id number].  Exp: 10/11.    Anticoagulation Management Assessment/Plan:      The patient's current anticoagulation dose is Coumadin 5 mg tabs: as directed by CVRR.  The target INR is 2 - 3.  The next INR is due 05/13/2010.  Anticoagulation instructions were given to patient.  Results were reviewed/authorized by Vashti Hey, RN.  He was notified by Vashti Hey RN.         Prior Anticoagulation Instructions: INR 2.5 Continue coumadin 2.5mg  once daily   Current Anticoagulation Instructions: INR 2.2 Continue coumadin 2.5mg  once daily

## 2010-12-23 ENCOUNTER — Telehealth (INDEPENDENT_AMBULATORY_CARE_PROVIDER_SITE_OTHER): Payer: Self-pay

## 2011-01-05 NOTE — Progress Notes (Signed)
**Note De-Identified [name] Obfuscation** Summary: refills  Medications Added DIOVAN 160 MG TABS (VALSARTAN) 1 tab two times a day       Phone Note Outgoing Call   Call placed by: Larita Fife Zaccary Creech LPN,  December 23, 2010 2:15 PM Details for Reason: refills Summary of Call: Pt. is due for f/u with Dr. Daleen Squibb and needs refills.  LMOM. Initial call taken by: Larita Fife Wynston Romey LPN,  December 23, 2010 2:16 PM  Follow-up for Phone Call        appt.scheduled for 01-12-11 with Dr. Daleen Squibb. Follow-up by: Larita Fife Keila Turan LPN,  December 27, 2010 10:45 AM    New/Updated Medications: DIOVAN 160 MG TABS (VALSARTAN) 1 tab two times a day Prescriptions: DIOVAN 160 MG TABS (VALSARTAN) 1 tab two times a day  #60 x 0   Entered by:   Larita Fife Jospeh Mangel LPN   Authorized by:   Gaylord Shih, MD, [facility]   Signed by:   Larita Fife Sarina Robleto LPN on 16/08/9603   Method used:   Electronically to        [facility] [phone]* (retail)       [address]       [place], [place]  [address]       Ph: [phone]       Fax: [phone]   RxID:   [id number]

## 2011-01-06 ENCOUNTER — Encounter (INDEPENDENT_AMBULATORY_CARE_PROVIDER_SITE_OTHER): Payer: BC Managed Care – PPO

## 2011-01-06 ENCOUNTER — Encounter: Payer: Self-pay | Admitting: Cardiology

## 2011-01-06 DIAGNOSIS — I4891 Unspecified atrial fibrillation: Secondary | ICD-10-CM

## 2011-01-06 DIAGNOSIS — Z7901 Long term (current) use of anticoagulants: Secondary | ICD-10-CM

## 2011-01-11 NOTE — Medication Information (Signed)
Summary: ccr-lr  Anticoagulant Therapy  Managed by: Vashti Hey, RN PCP: [name] Supervising MD: Dietrich Pates MD, Molly Maduro Indication 1: Atrial Fibrillation (ICD-427.31) Lab Used: [facility] [place] Site: [place] INR POC 2.3  Dietary changes: no    Health status changes: no    Bleeding/hemorrhagic complications: no    Recent/future hospitalizations: no    Any changes in medication regimen? no    Recent/future dental: no  Any missed doses?: no       Is patient compliant with meds? yes       Allergies: 1)  ! Lidocaine 2)  ! Penicillin 3)  ! * Tetanus 4)  ! * Decadron 5)  ! * Meprobamate 6)  ! * Decongestants  Anticoagulation Management History:      The patient is taking warfarin and comes in today for a routine follow up visit.  Positive risk factors for bleeding include an age of 75 years or older.  The bleeding index is 'intermediate risk'.  Positive CHADS2 values include History of HTN and Age > 67 years old.  The start date was 05/14/2004.  His last INR was 2.1 RATIO.  Anticoagulation responsible provider: Dietrich Pates MD, Molly Maduro.  INR POC: 2.3.  Cuvette Lot#: [id number].  Exp: 10/11.    Anticoagulation Management Assessment/Plan:      The patient's current anticoagulation dose is Coumadin 5 mg tabs: as directed by CVRR.  The target INR is 2 - 3.  The next INR is due 02/03/2011.  Anticoagulation instructions were given to patient.  Results were reviewed/authorized by Vashti Hey, RN.  He was notified by Vashti Hey RN.         Prior Anticoagulation Instructions: INR 2.4 Continue coumadin 2.5mg  once daily   Current Anticoagulation Instructions: INR 2.3 Continue coumadin 2.5mg  once daily

## 2011-01-12 ENCOUNTER — Encounter: Payer: Self-pay | Admitting: Cardiology

## 2011-01-12 ENCOUNTER — Other Ambulatory Visit: Payer: Self-pay | Admitting: Cardiology

## 2011-01-12 ENCOUNTER — Ambulatory Visit (INDEPENDENT_AMBULATORY_CARE_PROVIDER_SITE_OTHER): Payer: Medicare Other | Admitting: Cardiology

## 2011-01-12 DIAGNOSIS — I4891 Unspecified atrial fibrillation: Secondary | ICD-10-CM

## 2011-01-12 DIAGNOSIS — Z79899 Other long term (current) drug therapy: Secondary | ICD-10-CM

## 2011-01-12 LAB — BASIC METABOLIC PANEL
BUN: 15 mg/dL (ref 6–23)
Calcium: 9.2 mg/dL (ref 8.4–10.5)
GFR: 54.5 mL/min — ABNORMAL LOW (ref >60.00)
Glucose, Bld: 74 mg/dL (ref 70–99)
Sodium: 139 mEq/L (ref 135–145)

## 2011-01-12 LAB — TSH: TSH: 2.42 u[IU]/mL (ref 0.35–5.50)

## 2011-01-12 LAB — HEPATIC FUNCTION PANEL
Albumin: 4 g/dL (ref 3.5–5.2)
Alkaline Phosphatase: 56 U/L (ref 39–117)
Total Bilirubin: 0.8 mg/dL (ref 0.3–1.2)

## 2011-01-12 LAB — T4, FREE: Free T4: 0.83 ng/dL (ref 0.60–1.60)

## 2011-01-20 NOTE — Assessment & Plan Note (Signed)
Summary: f14m  Medications Added VITAMIN B-12 100 MCG TABS (CYANOCOBALAMIN) 1 tab twice daily        Visit Type:  follow-up 6 months Primary Provider:  [name]  CC:  sob last week and with congestion. Burning in both ankles to feet.Marland Kitchen  History of Present Illness: Chad [name] comes in for E and M of his PAF. He has minimal nonsustained sxs. Toleratng coumadin well. Walking on a regular basis. Has numbness from ankles down in the early am that resolves when up and around. Due amiodarone labs.  Current Medications (verified): 1)  Amiodarone Hcl 200 Mg Tabs (Amiodarone Hcl) .Marland Kitchen.. 1 Tab Once Daily 2)  Diovan 160 Mg Tabs (Valsartan) .Marland Kitchen.. 1 Tab Two Times A Day 3)  Aspirin Ec 325 Mg Tbec (Aspirin) .... Take One Tablet By Mouth Daily 4)  Coumadin 5 Mg Tabs (Warfarin Sodium) .... As Directed By Cvrr 5)  Lexapro 10 Mg Tabs (Escitalopram Oxalate) .Marland Kitchen.. 1 Tab Once Daily 6)  Centrum Silver  Tabs (Multiple Vitamins-Minerals) .Marland Kitchen.. 1 Tab Once Daily 7)  Vitamin D 1000 Unit Tabs (Cholecalciferol) .Marland Kitchen.. 1 Tab Two Times A Day 8)  Calcium-Magnesium 500-250 Mg Tabs (Calcium-Magnesium) .Marland Kitchen.. 1 Tab Once Daily 9)  Vitamin B-12 100 Mcg Tabs (Cyanocobalamin) .Marland Kitchen.. 1 Tab Twice Daily 10)  Coq10 100 Mg Caps (Coenzyme Q10) .... Take 1 Tab Daily  Allergies: 1)  ! Lidocaine 2)  ! Penicillin 3)  ! * Tetanus 4)  ! * Decadron 5)  ! * Meprobamate 6)  ! * Decongestants 7)  ! * Teline 8)  ! 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  He has one brother and six  sisters, none of whom have coronary artery disease.  Social History: Last updated:  07-08-2009   He lives in [place] with his wife, he is retired, he is  married, has been for quite some time.  He used to smoke but quit 40 years  ago, has about a 15 pack-year smoking history, does not drink, does not use  illicit drugs.  Review of Systems       negative other than HPI  Vital Signs:  Patient profile:   77 year old male Height:      70 inches Weight:      261.50 pounds BMI:     37.66 Pulse rate:   64 / minute Resp:     18 per minute BP sitting:   166 / 88  (left arm) Cuff size:   large  Vitals Entered By: Celestia Khat, CMA (January 12, 2011 11:29 AM)  Physical Exam  General:  obese.   Head:  normocephalic and atraumatic Eyes:  PERRLA/EOM intact; conjunctiva and lids normal. Neck:  Neck supple, no JVD. No masses, thyromegaly or abnormal cervical nodes. Chest Jerald Hennington:  no deformities or breast masses noted Lungs:  Clear bilaterally to auscultation and percussion. Heart:  RRR, NLS1 AND S2, no bruits Msk:  decreased ROM.   Pulses:  pulses normal in all 4 extremities Extremities:  No clubbing or cyanosis. Neurologic:  Alert and oriented x 3. Skin:  Intact without lesions or rashes. Psych:  Normal affect.   Impression & Recommendations:  Problem # 1:  PAROXYSMAL  ATRIAL FIBRILLATION (ICD-427.31)  His updated medication list for this problem includes:    Amiodarone Hcl 200 Mg Tabs (Amiodarone hcl) .Marland Kitchen... 1 tab once daily    Aspirin Ec 325 Mg Tbec (Aspirin) .Marland Kitchen... Take one tablet by mouth daily    Coumadin 5 Mg Tabs (Warfarin sodium) .Marland Kitchen... As directed by cvrr  Orders: TLB-BMP (Basic Metabolic Panel-BMET) (80048-METABOL) TLB-Hepatic/Liver Function Pnl (80076-HEPATIC) TLB-T4 (Thyrox), Free [phone] TLB-TSH (Thyroid Stimulating Hormone) (84443-TSH)  Orders: TLB-BMP (Basic Metabolic Panel-BMET) (80048-METABOL) TLB-Hepatic/Liver Function Pnl (80076-HEPATIC) TLB-T4 (Thyrox), Free [phone]) TLB-TSH (Thyroid Stimulating Hormone) (84443-TSH)  Problem  # 2:  ENCOUNTER FOR LONG-TERM USE OF OTHER MEDICATIONS (ICD-V58.69) Assessment: Unchanged  Orders: TLB-BMP (Basic Metabolic Panel-BMET) (80048-METABOL) TLB-Hepatic/Liver Function Pnl (80076-HEPATIC) TLB-T4 (Thyrox), Free [phone]) TLB-TSH (Thyroid Stimulating Hormone) (84443-TSH)  Problem # 3:  HYPERTENSION (ICD-401.9) Didnt take meds this morning. Does not eat salt. Pt will mo nitor. His updated medication list for this problem includes:    Diovan 160 Mg Tabs (Valsartan) .Marland Kitchen... 1 tab two times a day    Aspirin Ec 325 Mg Tbec (Aspirin) .Marland Kitchen... Take one tablet by mouth daily  Patient Instructions: 1)  Your physician recommends that you schedule a follow-up appointment in: 6 months with Dr. Daleen Squibb 2)  Your physician recommends that you return for lab work in: 3)  Your physician recommends that you continue on your current medications as directed. Please refer to the Current Medication list given to you today.

## 2011-01-24 LAB — BASIC METABOLIC PANEL
Chloride: 103 mEq/L (ref 96–112)
GFR calc non Af Amer: 53 mL/min — ABNORMAL LOW (ref >60)
Glucose, Bld: 80 mg/dL (ref 70–99)
Potassium: 4.1 mEq/L (ref 3.5–5.1)
Sodium: 138 mEq/L (ref 135–145)

## 2011-01-24 LAB — POCT CARDIAC MARKERS
CKMB, poc: <1 ng/mL — ABNORMAL LOW (ref 1.0–8.0)
Myoglobin, poc: 51.3 ng/mL (ref 12–200)
Troponin i, poc: <0.05 ng/mL (ref 0.00–0.09)

## 2011-01-24 LAB — DIFFERENTIAL
Eosinophils Relative: 2 % (ref 0–5)
Lymphocytes Relative: 29 % (ref 12–46)
Monocytes Absolute: 1.1 10*3/uL — ABNORMAL HIGH (ref 0.1–1.0)
Monocytes Relative: 17 % — ABNORMAL HIGH (ref 3–12)
Neutro Abs: 3.2 10*3/uL (ref 1.7–7.7)

## 2011-01-24 LAB — URINALYSIS, ROUTINE W REFLEX MICROSCOPIC
Bilirubin Urine: NEGATIVE
Glucose, UA: NEGATIVE mg/dL
Hgb urine dipstick: NEGATIVE
Specific Gravity, Urine: 1.01 (ref 1.005–1.030)
pH: 7 (ref 5.0–8.0)

## 2011-01-24 LAB — PROTIME-INR: Prothrombin Time: 20.6 seconds — ABNORMAL HIGH (ref 11.6–15.2)

## 2011-01-24 LAB — CBC
HCT: 44.2 % (ref 39.0–52.0)
Hemoglobin: 15.5 g/dL (ref 13.0–17.0)
MCV: 83.4 fL (ref 78.0–100.0)
WBC: 6.3 10*3/uL (ref 4.0–10.5)

## 2011-01-26 ENCOUNTER — Telehealth: Payer: Self-pay | Admitting: Cardiology

## 2011-01-30 LAB — CBC
Hemoglobin: 14.9 g/dL (ref 13.0–17.0)
MCHC: 32.6 g/dL (ref 30.0–36.0)
MCV: 89 fL (ref 78.0–100.0)
RDW: 15.1 % (ref 11.5–15.5)

## 2011-01-30 LAB — PROTIME-INR: INR: 1.15 (ref 0.00–1.49)

## 2011-01-30 LAB — BASIC METABOLIC PANEL
CO2: 28 mEq/L (ref 19–32)
Calcium: 9 mg/dL (ref 8.4–10.5)
Chloride: 103 mEq/L (ref 96–112)
Glucose, Bld: 117 mg/dL — ABNORMAL HIGH (ref 70–99)
Sodium: 138 mEq/L (ref 135–145)

## 2011-01-30 LAB — URINALYSIS, ROUTINE W REFLEX MICROSCOPIC
Bilirubin Urine: NEGATIVE
Hgb urine dipstick: NEGATIVE
Protein, ur: NEGATIVE mg/dL
Urobilinogen, UA: 0.2 mg/dL (ref 0.0–1.0)

## 2011-02-01 ENCOUNTER — Encounter: Payer: Self-pay | Admitting: Cardiology

## 2011-02-01 DIAGNOSIS — Z7901 Long term (current) use of anticoagulants: Secondary | ICD-10-CM | POA: Insufficient documentation

## 2011-02-01 DIAGNOSIS — I4891 Unspecified atrial fibrillation: Secondary | ICD-10-CM

## 2011-02-01 NOTE — Progress Notes (Signed)
Summary: pt rtn call   Phone Note Call from Patient   Caller: Patient [phone] Reason for Call: Talk to Nurse Summary of Call: pt rtn call to debbie re lab results Initial call taken by: Glynda Jaeger,  January 26, 2011 3:28 PM  Follow-up for Phone Call        Pt aware of lab results. refilled diovan Follow-up by: Lisabeth Devoid RN,  January 26, 2011 3:40 PM    Prescriptions: DIOVAN 160 MG TABS (VALSARTAN) 1 tab two times a day  #60 x 6   Entered by:   Lisabeth Devoid RN   Authorized by:   Gaylord Shih, MD, [facility]   Signed by:   Lisabeth Devoid RN on 01/26/2011   Method used:   Electronically to        [facility]. [phone]* (retail)       [address]       [place], [place]  [address]       Ph: [phone]       Fax: [phone]   RxID:   [id number]

## 2011-02-03 ENCOUNTER — Ambulatory Visit (INDEPENDENT_AMBULATORY_CARE_PROVIDER_SITE_OTHER): Payer: Medicare Other | Admitting: *Deleted

## 2011-02-03 DIAGNOSIS — I4891 Unspecified atrial fibrillation: Secondary | ICD-10-CM

## 2011-02-03 DIAGNOSIS — Z7901 Long term (current) use of anticoagulants: Secondary | ICD-10-CM

## 2011-02-08 ENCOUNTER — Other Ambulatory Visit: Payer: Self-pay | Admitting: Physician Assistant

## 2011-02-14 LAB — BASIC METABOLIC PANEL
CO2: 29 mEq/L (ref 19–32)
Calcium: 9.1 mg/dL (ref 8.4–10.5)
Chloride: 101 mEq/L (ref 96–112)
Glucose, Bld: 195 mg/dL — ABNORMAL HIGH (ref 70–99)
Potassium: 4.5 mEq/L (ref 3.5–5.1)
Sodium: 137 mEq/L (ref 135–145)

## 2011-02-14 LAB — DIFFERENTIAL
Basophils Absolute: 0 10*3/uL (ref 0.0–0.1)
Basophils Relative: 0 % (ref 0–1)
Eosinophils Absolute: 0.2 10*3/uL (ref 0.0–0.7)
Eosinophils Relative: 3 % (ref 0–5)
Monocytes Absolute: 0.5 10*3/uL (ref 0.1–1.0)
Monocytes Relative: 9 % (ref 3–12)

## 2011-02-14 LAB — CBC
HCT: 41.4 % (ref 39.0–52.0)
Hemoglobin: 13.9 g/dL (ref 13.0–17.0)
MCHC: 33.5 g/dL (ref 30.0–36.0)
MCV: 88.1 fL (ref 78.0–100.0)
RBC: 4.71 MIL/uL (ref 4.22–5.81)
RDW: 14.6 % (ref 11.5–15.5)

## 2011-02-18 LAB — COMPREHENSIVE METABOLIC PANEL
ALT: 14 U/L (ref 0–53)
AST: 19 U/L (ref 0–37)
Alkaline Phosphatase: 50 U/L (ref 39–117)
CO2: 29 mEq/L (ref 19–32)
Calcium: 8.5 mg/dL (ref 8.4–10.5)
Chloride: 106 mEq/L (ref 96–112)
GFR calc Af Amer: >60 mL/min (ref >60)
GFR calc non Af Amer: 50 mL/min — ABNORMAL LOW (ref >60)
Glucose, Bld: 123 mg/dL — ABNORMAL HIGH (ref 70–99)
Potassium: 4.6 mEq/L (ref 3.5–5.1)
Sodium: 138 mEq/L (ref 135–145)

## 2011-02-18 LAB — DIFFERENTIAL
Basophils Absolute: 0 10*3/uL (ref 0.0–0.1)
Basophils Absolute: 0 10*3/uL (ref 0.0–0.1)
Basophils Absolute: 0 10*3/uL (ref 0.0–0.1)
Basophils Absolute: 0 10*3/uL (ref 0.0–0.1)
Basophils Absolute: 0 10*3/uL (ref 0.0–0.1)
Basophils Relative: 0 % (ref 0–1)
Basophils Relative: 0 % (ref 0–1)
Basophils Relative: 0 % (ref 0–1)
Basophils Relative: 0 % (ref 0–1)
Eosinophils Absolute: 0 10*3/uL (ref 0.0–0.7)
Eosinophils Absolute: 0.1 10*3/uL (ref 0.0–0.7)
Eosinophils Absolute: 0.1 10*3/uL (ref 0.0–0.7)
Eosinophils Relative: 0 % (ref 0–5)
Eosinophils Relative: 0 % (ref 0–5)
Eosinophils Relative: 1 % (ref 0–5)
Eosinophils Relative: 1 % (ref 0–5)
Eosinophils Relative: 2 % (ref 0–5)
Eosinophils Relative: 3 % (ref 0–5)
Lymphocytes Relative: 4 % — ABNORMAL LOW (ref 12–46)
Lymphocytes Relative: 9 % — ABNORMAL LOW (ref 12–46)
Lymphs Abs: 1.2 10*3/uL (ref 0.7–4.0)
Monocytes Absolute: 0 10*3/uL — ABNORMAL LOW (ref 0.1–1.0)
Monocytes Absolute: 0.8 10*3/uL (ref 0.1–1.0)
Monocytes Absolute: 0.8 10*3/uL (ref 0.1–1.0)
Monocytes Relative: 0 % — ABNORMAL LOW (ref 3–12)
Monocytes Relative: 11 % (ref 3–12)
Monocytes Relative: 11 % (ref 3–12)
Neutro Abs: 5 10*3/uL (ref 1.7–7.7)
Neutro Abs: 9.6 10*3/uL — ABNORMAL HIGH (ref 1.7–7.7)

## 2011-02-18 LAB — BASIC METABOLIC PANEL
BUN: 8 mg/dL (ref 6–23)
BUN: 9 mg/dL (ref 6–23)
CO2: 24 mEq/L (ref 19–32)
CO2: 27 mEq/L (ref 19–32)
CO2: 27 mEq/L (ref 19–32)
Calcium: 8.3 mg/dL — ABNORMAL LOW (ref 8.4–10.5)
Calcium: 8.8 mg/dL (ref 8.4–10.5)
Calcium: 8.9 mg/dL (ref 8.4–10.5)
Chloride: 102 mEq/L (ref 96–112)
Chloride: 102 mEq/L (ref 96–112)
Chloride: 105 mEq/L (ref 96–112)
GFR calc Af Amer: 59 mL/min — ABNORMAL LOW (ref >60)
GFR calc Af Amer: >60 mL/min (ref >60)
GFR calc Af Amer: >60 mL/min (ref >60)
GFR calc non Af Amer: 49 mL/min — ABNORMAL LOW (ref >60)
GFR calc non Af Amer: >60 mL/min (ref >60)
Glucose, Bld: 105 mg/dL — ABNORMAL HIGH (ref 70–99)
Glucose, Bld: 114 mg/dL — ABNORMAL HIGH (ref 70–99)
Glucose, Bld: 121 mg/dL — ABNORMAL HIGH (ref 70–99)
Glucose, Bld: 137 mg/dL — ABNORMAL HIGH (ref 70–99)
Glucose, Bld: 155 mg/dL — ABNORMAL HIGH (ref 70–99)
Potassium: 3.2 mEq/L — ABNORMAL LOW (ref 3.5–5.1)
Potassium: 4 mEq/L (ref 3.5–5.1)
Potassium: 4.1 mEq/L (ref 3.5–5.1)
Potassium: 4.1 mEq/L (ref 3.5–5.1)
Potassium: 4.1 mEq/L (ref 3.5–5.1)
Sodium: 136 mEq/L (ref 135–145)
Sodium: 138 mEq/L (ref 135–145)
Sodium: 140 mEq/L (ref 135–145)

## 2011-02-18 LAB — CBC
HCT: 37 % — ABNORMAL LOW (ref 39.0–52.0)
HCT: 37.7 % — ABNORMAL LOW (ref 39.0–52.0)
HCT: 38.2 % — ABNORMAL LOW (ref 39.0–52.0)
HCT: 40.6 % (ref 39.0–52.0)
HCT: 41 % (ref 39.0–52.0)
Hemoglobin: 12.9 g/dL — ABNORMAL LOW (ref 13.0–17.0)
Hemoglobin: 13.3 g/dL (ref 13.0–17.0)
Hemoglobin: 14 g/dL (ref 13.0–17.0)
Hemoglobin: 14.1 g/dL (ref 13.0–17.0)
MCHC: 33.8 g/dL (ref 30.0–36.0)
MCHC: 34 g/dL (ref 30.0–36.0)
MCHC: 34.4 g/dL (ref 30.0–36.0)
MCHC: 34.4 g/dL (ref 30.0–36.0)
MCHC: 34.6 g/dL (ref 30.0–36.0)
MCV: 90.3 fL (ref 78.0–100.0)
MCV: 90.4 fL (ref 78.0–100.0)
Platelets: 150 10*3/uL (ref 150–400)
Platelets: 162 10*3/uL (ref 150–400)
RBC: 4.21 MIL/uL — ABNORMAL LOW (ref 4.22–5.81)
RBC: 4.26 MIL/uL (ref 4.22–5.81)
RBC: 4.5 MIL/uL (ref 4.22–5.81)
RBC: 4.55 MIL/uL (ref 4.22–5.81)
RDW: 14.1 % (ref 11.5–15.5)
RDW: 14.6 % (ref 11.5–15.5)
RDW: 14.6 % (ref 11.5–15.5)
RDW: 14.7 % (ref 11.5–15.5)
RDW: 14.8 % (ref 11.5–15.5)
WBC: 17.6 10*3/uL — ABNORMAL HIGH (ref 4.0–10.5)

## 2011-02-18 LAB — URINALYSIS, ROUTINE W REFLEX MICROSCOPIC
Nitrite: POSITIVE — AB
Urobilinogen, UA: 4 mg/dL — ABNORMAL HIGH (ref 0.0–1.0)

## 2011-02-18 LAB — GLUCOSE, CAPILLARY
Glucose-Capillary: 100 mg/dL — ABNORMAL HIGH (ref 70–99)
Glucose-Capillary: 100 mg/dL — ABNORMAL HIGH (ref 70–99)
Glucose-Capillary: 102 mg/dL — ABNORMAL HIGH (ref 70–99)
Glucose-Capillary: 108 mg/dL — ABNORMAL HIGH (ref 70–99)
Glucose-Capillary: 110 mg/dL — ABNORMAL HIGH (ref 70–99)
Glucose-Capillary: 115 mg/dL — ABNORMAL HIGH (ref 70–99)
Glucose-Capillary: 123 mg/dL — ABNORMAL HIGH (ref 70–99)
Glucose-Capillary: 131 mg/dL — ABNORMAL HIGH (ref 70–99)
Glucose-Capillary: 149 mg/dL — ABNORMAL HIGH (ref 70–99)
Glucose-Capillary: 151 mg/dL — ABNORMAL HIGH (ref 70–99)
Glucose-Capillary: 96 mg/dL (ref 70–99)

## 2011-02-18 LAB — CULTURE, BLOOD (ROUTINE X 2)
Culture: NO GROWTH
Culture: NO GROWTH
Report Status: 9092010

## 2011-02-18 LAB — MAGNESIUM: Magnesium: 2.2 mg/dL (ref 1.5–2.5)

## 2011-02-18 LAB — PROTIME-INR
INR: 2.7 — ABNORMAL HIGH (ref 0.00–1.49)
INR: 3 — ABNORMAL HIGH (ref 0.00–1.49)
Prothrombin Time: 22.8 seconds — ABNORMAL HIGH (ref 11.6–15.2)
Prothrombin Time: 27.8 seconds — ABNORMAL HIGH (ref 11.6–15.2)
Prothrombin Time: 28.6 seconds — ABNORMAL HIGH (ref 11.6–15.2)

## 2011-02-18 LAB — URINE CULTURE: Colony Count: >=100000

## 2011-02-19 LAB — GLUCOSE, CAPILLARY: Glucose-Capillary: 117 mg/dL — ABNORMAL HIGH (ref 70–99)

## 2011-02-19 LAB — URINALYSIS, ROUTINE W REFLEX MICROSCOPIC
Bilirubin Urine: NEGATIVE
Ketones, ur: NEGATIVE mg/dL
Nitrite: NEGATIVE
Specific Gravity, Urine: 1.019 (ref 1.005–1.030)
Urobilinogen, UA: 1 mg/dL (ref 0.0–1.0)

## 2011-02-19 LAB — PROTIME-INR: INR: 1.1 (ref 0.00–1.49)

## 2011-03-03 ENCOUNTER — Ambulatory Visit (INDEPENDENT_AMBULATORY_CARE_PROVIDER_SITE_OTHER): Payer: Medicare Other | Admitting: *Deleted

## 2011-03-03 DIAGNOSIS — Z7901 Long term (current) use of anticoagulants: Secondary | ICD-10-CM

## 2011-03-03 DIAGNOSIS — I4891 Unspecified atrial fibrillation: Secondary | ICD-10-CM

## 2011-03-09 ENCOUNTER — Ambulatory Visit (INDEPENDENT_AMBULATORY_CARE_PROVIDER_SITE_OTHER): Payer: Medicare Other | Admitting: *Deleted

## 2011-03-09 DIAGNOSIS — Z7901 Long term (current) use of anticoagulants: Secondary | ICD-10-CM

## 2011-03-09 DIAGNOSIS — I4891 Unspecified atrial fibrillation: Secondary | ICD-10-CM

## 2011-03-09 LAB — POCT INR: INR: 3.1

## 2011-03-29 NOTE — Op Note (Signed)
NAME:  Chad [name], Chad [name] NO.:  [id number]   MEDICAL RECORD NO.:  [id number]          PATIENT TYPE:  AMB   LOCATION:  [facility]                         FACILITY:  [facility]   PHYSICIAN:  Ronald L. Earlene Plater, M.D.  DATE OF BIRTH:  1929/04/23   DATE OF PROCEDURE:  07/10/2009  DATE OF DISCHARGE:                               OPERATIVE REPORT   PREOPERATIVE DIAGNOSIS:  Bladder tumor.   OPERATIVE PROCEDURE:  Cystourethroscopy, gyrus TURBT (transurethral  resection of bladder tumor).   SURGEON:  Laurin Coder, MD   ANESTHESIA:  LMA.   ESTIMATED BLOOD LOSS:  Negligible.   TUBES:  22 French 10 mL balloon Foley catheter.   COMPLICATIONS:  None.   INDICATIONS FOR PROCEDURE:  Chad [name] is a very nice 77 year old white  male who presented with gross painless hematuria.  CT scan revealed that  upper tracts were okay.  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  After understanding risks, benefits and  alternatives and after stopping his Coumadin and having normalized PT he  has elected to proceed with the above procedure.   PROCEDURE IN DETAIL:  The patient was placed in supine position and  after proper LMA anesthesia was placed in the dorsal lithotomy position  and prepped and draped with Betadine in sterile fashion.  Utilizing a 28  French continuous flow gyrus resectoscope sheath placed over a  Timberlake obturator the bladder was carefully inspected.  He was noted  to have moderate trilobar hypertrophy.  Grade 1 trabeculation was noted  in the bladder.  Both ureteral orifices were normal location and efflux  of clear urine was noted from each of them.  A 1.5 cm pedunculated  bladder tumor was noted to be adherent to the right lateral bladder  wall.  The patient was paralyzed while being under anesthesia for a  short period of time to prevent obturator spasm and the specimens were  resected with the gyrus resectoscope sheath and specimen was submitted  to pathology.  The base was cauterized with the cautery.  No other  lesions were noted to be present.  Good hemostasis being present.  The  bladder had been carefully inspected with the 12 and 70 degree lenses.  Bladder was drained and panendoscope was removed.  A 22 French 10 mL  balloon Foley catheter was passed and the bladder was noted to irrigate  clear.  The patient tolerated the procedure well.  No complications.  He  was taken to the recovery room stable.     Ronald L. Earlene Plater, M.D.  Electronically Signed    RLD/MEDQ  D:  07/10/2009  T:  07/10/2009  Job:  [id number]

## 2011-03-29 NOTE — Assessment & Plan Note (Signed)
[facility]ALTHCARE                            CARDIOLOGY OFFICE NOTE   Chad [name], Chad [name]                      MRN:          [id number]  DATE:09/07/2007                            DOB:          17-Sep-1929    Mr. Chad [name] comes in today.   PROBLEM LIST:  1. Paroxysmal atrial fibrillation.  He is under good control with low-      dose amiodarone.  Again, he has multiple questions, mostly      generated by the Internet, about the side effects of amiodarone.      He has some funny feelings that he cannot really describe in his      feet and lower legs that come and go.  They are not there      constantly.  He has had no weakness, no problems with gait or      balance.  It should be noted he has diabetes as well.  2. Hypertension.  3. Hyperlipidemia.  4. Anticoagulation, on Coumadin.   He continues to keep his weight down at 244.   His medications are unchanged since last visit.  Please refer to that  list.  He is taking chelated calcium and magnesium, 125 mg of magnesium,  250 mg of calcium, which he says helps keep him in normal rhythm.   His blood pressure today is 120/68, his pulse is 53 and he is in sinus  brady.  He has some nonspecific ST segment changes, QTc is stable.  Weight is 244.  HEENT:  Slightly ruddy complexion.  PERRLA.  Extraocular movements  intact.  Sclerae clear, facial symmetry is normal.  Carotid upstrokes were equal bilaterally without bruits, there is no  JVD.  Thyroid is not enlarged.  Trachea is midline.  LUNGS:  Clear.  HEART:  Reveals a regular rate and rhythm without murmurs, rubs, or  gallops.  PMI could not be appreciated.  ABDOMINAL EXAM:  Protuberant, good bowel sounds, organomegaly was  difficult to assess.  EXTREMITIES:  Reveal no edema.  Pulses were present.  He has good  capillary refill.  He had no weakness demonstrated.  There is no  significant swelling, there is no DVT.  NEURO EXAM:  Intact.   Mr. Chad [name] is  doing well.  Will check a sed rate along with other  amiodarone blood work to make sure he is doing well.  I have tried to  reinforce the fact I do not think his amiodarone is causing his  symptoms, and is doing a good job of keeping him in normal rhythm.  He  also had multiple  questions about ablation, and I told him he was not a candidate for that  yet.  Thank goodness!  Will see him back in 6 months.     Thomas C. Daleen Squibb, MD, [facility]  Electronically Signed    TCW/MedQ  DD: 09/07/2007  DT: 09/08/2007  Job #: [id number]   cc:   Corrie Mckusick, M.D.

## 2011-03-29 NOTE — Assessment & Plan Note (Signed)
[facility]ALTHCARE                       [place] CARDIOLOGY OFFICE NOTE   Chad [name]                      MRN:          [id number]  DATE:01/02/2009                            DOB:          [birth year]    Mr. Chad [name] comes in today for further management of paroxysmal atrial  fibrillation.  I saw him last July 11, 2008.  Blood work at that time  was stable, on amiodarone.  He has had recent blood work drawn by Dr.  Phillips Odor at [facility].   He is currently without any complaints of tachy palpitations.  He says  he thinks the secret is his chelated calcium and magnesium that he takes  each day.  He says ever since he took that it has smoothed itself out.   His medications otherwise are unchanged.   PHYSICAL EXAMINATION:  GENERAL:  He is in no acute distress.  His blood  pressure is 150/70.  He has not taken his medicines this morning!  VITAL SIGNS:  Pulse 78 and regular.  Weight is 258 identical to last  time.  HEENT:  Other than ruddy complexion is normal.  Carotids upstrokes were  equal bilaterally without bruits.  No JVD.  Thyroid is not enlarged.  Trachea is midline.  LUNGS:  Clear to auscultation percussion.  HEART:  Reveals a poorly appreciated PMI, soft S1 and S2.  Regular rate  and rhythm.  ABDOMEN:  Slightly protuberant, good bowel sounds.  Organomegaly cannot  be adequately assessed.  EXTREMITIES:  Only trace edema.  Pulses are present.  There is no sign  of DVT.  NEURO:  Intact.   His INR today is 3.0.  This is after having a big salad at [employer]  last night!   ASSESSMENT AND PLAN:  Mr. Chad [name] is doing well.  He just had repeat blood  work, so I will not repeat.  We will see him back in 6 months.     Chad C. Daleen Squibb, MD, [facility]  Electronically Signed    TCW/MedQ  DD: 01/02/2009  DT: 01/03/2009  Job #: [id number]   cc:   Chad [name], M.D.

## 2011-03-29 NOTE — Assessment & Plan Note (Signed)
[facility]                            CARDIOLOGY OFFICE NOTE   Chad [name], Chad [name]                      MRN:          [id number]  DATE:07/11/2008                            DOB:          01/22/1929    Chad [name] returns today for evaluation of his paroxysmal atrial  fibrillation.  Since being seen last fall, he has done remarkably well.  He has had one episode of brief palpitations.  He states it is usually  better when he is up and about doing things.   He has been traveling all over the [place] and joining his  retirement.  He denies any orthopnea, PND, or peripheral edema.   He does have some numbness in his lower part of his legs in the morning  when he wakes up.  It goes away during the day.  He was concerned about  it being amiodarone neuropathy, but I suspect that it is not the case,  since it is only there in the mornings.   He has hypertension, hyperlipidemia, and anticoagulation on Coumadin.   He recently had blood work by Dr. Assunta Found.  His LFTs were normal.  His CBC was normal.  I do not see a TSH.  Triglycerides were normal.   MEDICATIONS:  1. Diovan 160 mg p.o. daily.  2. Amlodipine 5 mg a day.  3. Furosemide 40 mg a day.  4. Coumadin as directed.  5. Amiodarone 200 mg a day.  6. Metformin 250 b.i.d.  7. Lexapro 10 mg a day.  8. Multivitamin.  9. Enteric-coated aspirin 325 a day.  10.Simvastatin, which he was asked to start, he has not started, 20 mg      a day.   PHYSICAL EXAMINATION:  VITALS:  His blood pressure today is 146/78,  pulse 68 and regular, and his weight is 258.  HEENT:  Unchanged.  NECK:  Carotids upstrokes are equal bilaterally without bruits.  No JVD.  Thyroid is not enlarged.  Trachea is midline.  LUNGS:  Clear.  HEART:  Reveals a soft S1 and S2.  Poorly appreciated PMI.  ABDOMEN:  Protuberant.  Good bowel sounds.  No midline bruit.  EXTREMITIES:  No cyanosis, clubbing, or edema.  Pulses are  intact.  NEURO:  Intact.   EKG shows sinus rhythm with an incomplete right bundle, which is stable.   I had a long talk with Chad [name].  We will check a TSH and a protime  today.  If he does start simvastatin, he will need close monitoring of  his LFTs being on amiodarone.  However, since they are both at low dose,  it probably would not be a significant drug-drug interaction.  I will  leave this to Dr. Lamar Blinks expertise.     Thomas C. Daleen Squibb, MD, [facility]  Electronically Signed    TCW/MedQ  DD: 07/11/2008  DT: 07/12/2008  Job #: [id number]   cc:   Corrie Mckusick, M.D.

## 2011-04-01 NOTE — Procedures (Signed)
   NAME:  Chad [name], Chad [name] NO.:  [id number]   MEDICAL RECORD NO.:  [id number]                   PATIENT TYPE:  INP   LOCATION:  A217                                 FACILITY:  [facility]   PHYSICIAN:  Vida Roller, M.D.                DATE OF BIRTH:  Mar 28, 1929   DATE OF PROCEDURE:  07/01/2003  DATE OF DISCHARGE:                                    STRESS TEST   ADENOSINE CARDIOLITE   INDICATION:  Mr. Kosik is a 77 year old male with no known coronary artery  disease who presented with complaints of left shoulder pain and palpitation.  The patient does have a history of paroxysmal atrial fibrillation.  He was  admitted and has had two sets of cardiac enzymes which both were negative  for acute myocardial infarction.   BASELINE DATA:  EKG shows sinus bradycardia at 48 beats per minute with  inverted T waves in leads I, II, aVF, V5, and V6.  Blood pressure is 148/82.   Adenosine 60 mg was infused over four minute protocol with Cardiolite  injected at three minutes.  The patient complained of shortness of breath  which resolved in recovery.  EKG showed few PACs and no ischemic changes.   Final images and results are pending M.D. review.     Amy Mercy Riding, P.A. [facility]                     Vida Roller, M.D.    AB/MEDQ  D:  07/01/2003  T:  07/01/2003  Job:  [id number]

## 2011-04-01 NOTE — Discharge Summary (Signed)
   NAME:  Chad [name], Chad [name]                         ACCOUNT NO.:  [id number]   MEDICAL RECORD NO.:  [id number]                   PATIENT TYPE:  INP   LOCATION:  A217                                 FACILITY:  [facility]   PHYSICIAN:  Kirk Ruths, M.D.            DATE OF BIRTH:  1929/04/28   DATE OF ADMISSION:  06/30/2003  DATE OF DISCHARGE:  07/01/2003                                 DISCHARGE SUMMARY   DISCHARGE DIAGNOSES:  1. Chest pain.  2. Palpitations.  3. History of hypertension.  4. History of atrial fibrillation.  5. Chronic back problems.  6. Abnormal stress Cardiolite.   HOSPITAL COURSE:  This 77 year old white male was eating cold watermelon  when he began having palpitations and severe pain in his left chest  radiating to the left shoulder, which lasted for approximately 20-30  minutes.  The pain resolved by the time he presented to the emergency room.  The patient also had noticed he had had similar problems in the past related  to eating ice cream.  An EKG showed a normal sinus rhythm with some  occasional PACs.  The patient was admitted to the floor. Serial enzymes were  obtained which were negative.  The patient's blood pressure was slightly  elevated on admission.  It improved during his stay.  The patient had no  more arrhythmias or chest pain during his stay.  He was seen in consultation  by Dr. 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  Dr.  Dorethea Clan stated he would be following up this test as an outpatient.  The  patient was stable at the time of discharge.   DISCHARGE MEDICATIONS:  He was continued on his previous medications which  included:  1. Aspirin.  2. Ziac 2.5.  3. Diovan 40.  4. Lasix 20.                                               Kirk Ruths, M.D.    WMM/MEDQ  D:   07/08/2003  T:  07/08/2003  Job:  [id number]

## 2011-04-01 NOTE — H&P (Signed)
   NAME:  Chad [name], Chad [name] NO.:  [id number]   MEDICAL RECORD NO.:  [id number]                   PATIENT TYPE:  EMS   LOCATION:  ED                                   FACILITY:  [facility]   PHYSICIAN:  Kirk Ruths, M.D.            DATE OF BIRTH:  Apr 24, 1929   DATE OF ADMISSION:  06/30/2003  DATE OF DISCHARGE:                                HISTORY & PHYSICAL   CHIEF COMPLAINT:  Chest pain.   PRESENTING ILLNESS:  This 77 year old white male has a history of atrial  fibrillation.  The patient stated that he was eating some cold watermelon  and began having palpitations and severe pain in his left chest radiating to  his left shoulder.  This last for 20-30 minutes and resolved at the time  that he presented to the emergency room.  The patient was admitted for rule  out  MI.   The patient, as mentioned, has a prior history of atrial fibrillation. He  was in regular sinus rhythm with PACs at the time.  He has a history of  hypertension for which he takes Ziac 2.5 as well as Diovan, unknown dosage;  Lasix; and aspirin.  The patient is status post discectomy approximately 20  years ago.  Currently on a Prednisone Dosepak, at this time for his back  pain.   REVIEW OF SYSTEMS:  Denies nausea, vomiting, diaphoresis, or shortness of  breath.   PHYSICAL EXAMINATION:  GENERAL: A well-developed, well-nourished, white male  in no severe distress.  VITAL SIGNS:  He is afebrile.  Initial blood pressure on presentation is  195/96.  Pulse is 84.  Respirations are 20.  HEENT:  TMs are normal.  Pupils are equal and reactive to light and  accommodation.  Oropharynx is benign.  NECK:  Supple without JVD, bruits, or thyromegaly.  LUNGS:  Lungs are clear in all areas.  HEART:  Heart with a regular sinus rhythm without murmur, gallop, or rub.  ABDOMEN:  Soft and nontender.  EXTREMITIES:  Without clubbing, cyanosis, or edema.  NEUROLOGIC: Examination is grossly intact.   ASSESSMENT:  1. Chest pain.  2. History of atrial fibrillation.  3. Hypertension.  4. Chronic back problems.   LABS:  Labs revealed EKG with nonspecific changes, but regular sinus rhythm  with occasional PACs.  Initial cardiac enzymes were negative.                                              Kirk Ruths, M.D.   WMM/MEDQ  D:  06/30/2003  T:  06/30/2003  Job:  [id number]

## 2011-04-01 NOTE — Assessment & Plan Note (Signed)
[facility]                            CARDIOLOGY OFFICE NOTE   Chad [name], Chad [name]                      MRN:          [id number]  DATE:02/07/2007                            DOB:          10-13-1929    Chad [name] returns today for management of the following issues.  1. Paroxysmal atrial fibrillation.  He said he had a bad day      yesterday, but otherwise has done well.  He has been good today.      He is on low-dose amiodarone.  He has multiple questions about side      effects after talking to Dr. Sudie Bailey in the theater.  I tried to      reassure him that he is on low-dose and the side effects are      minimal, and we are following him closely.  2. Hypertension, which is under good control.  3. Hyperlipidemia.  Labs are being followed by Dr. Phillips Odor.  However,      we follow amiodarone labs.  4. Anticoagulation on Coumadin.  He is being followed in the      [facility].   He took our message to heart and lost 19 pounds to 241.   MEDICATIONS:  1. His blood sugars have been running low and he is backing off on his      metformin.  He is only on 250 mg b.i.d.  2. He is on Norvasc 5 mg a day.  3. Diovan 160 b.i.d.  4. Lexapro 10 mg a day.  5. Coumadin as directed.  6. Amiodarone 200 mg a day.  7. Furosemide 20 mg a day.  8. Multivitamin daily.  9. Aspirin 325 a day.   His blood pressure is 121/62, his pulse 68 and regular.  His  electrocardiogram shows sinus rhythm with incomplete right bundle.  QTc  is 462 and nonspecific ST segment changes.  There has been no change  from before.  His weight is down to 241.  HEENT:  Normocephalic, atraumatic.  PERRLA.  Extraocular movements  intact. Sclerae clear.  Facial symmetry is normal.  NECK:  Supple.  Carotids are full.  No bruits.  Thyroid is not enlarged.  Trachea is midline.  LUNGS:  Clear.  HEART:  Nondisplaced PMI.  He has normal S1, S2.  ABDOMEN:  Protuberant with good bowel sounds.   Organomegaly could not be  assessed.  EXTREMITIES:  Reveal trace edema.  Pulses are present.  NEURO:  Intact.   ASSESSMENT AND PLAN:  Chad [name] is maintaining sinus rhythm most of the  time clinically with low-dose amiodarone.  We will check amiodarone labs  today.  I have tried to reassure him  that this has been a very effective treatment for him, and he has had no  significant side effects.  We will see him back again in 6 months.     Thomas C. Daleen Squibb, MD, [facility]  Electronically Signed    TCW/MedQ  DD: 02/07/2007  DT: 02/07/2007  Job #: [id number]   cc:   Corrie Mckusick, M.D.

## 2011-04-01 NOTE — Op Note (Signed)
NAME:  Chad [name]                         ACCOUNT NO.:  [id number]   MEDICAL RECORD NO.:  [id number]                   PATIENT TYPE:  AMB   LOCATION:  DAY                                  FACILITY:  [facility]   PHYSICIAN:  Lionel December, M.D.                 DATE OF BIRTH:  06-Mar-1929   DATE OF PROCEDURE:  04/06/2004  DATE OF DISCHARGE:                                 OPERATIVE REPORT   PROCEDURE:  Total colonoscopy.   INDICATIONS FOR PROCEDURE:  Chad [name] is a 77 year old Caucasian male who is here  for screening colonoscopy.  Family history is positive for colon carcinoma  in his sister who also has had two other malignancies and is doing well.  His last exam was over five years ago.  The procedure and risks were  reviewed with the patient, and informed consent was obtained.   PREOPERATIVE MEDICATIONS:  Demerol 25 mg IV, Versed 5 mg IV in divided dose.   FINDINGS:  The procedure was performed in the endoscopy suite.  The  patient's vital signs and O2 saturations were monitored during the procedure  and remained [name].  The patient was placed in the left lateral recumbent  position and rectal examination performed.  No abnormality noted on external  or digital exam.  The Olympus videoscope was placed into the rectum and  advanced into the region of the sigmoid colon and beyond.  The preparation  was satisfactory.  He had scattered diverticula throughout the colon.  The  scope was advanced to the cecum which was identified by a lipomatous  ileocecal valve and appendiceal orifice.  Pictures were taken for the  record.  This area had to be washed to examine the underlying mucosa which  was normal.  As the scope was withdrawn, the colonic mucosa was carefully  examined and was normal throughout.  There were no polyps and/or tumor  masses.  The rectal mucosa was normal.  The scope was retroflexed to examine  the anorectal junction, and moderate-size hemorrhoids were noted below the  dentate  line.  The endoscope was straightened and withdrawn.  The patient  tolerated the procedure well.   FINAL DIAGNOSES:  1. Pancolonic diverticulosis.  2. Moderate-size external hemorrhoids.   RECOMMENDATIONS:  1. He should continue yearly Hemoccults.  2. He may consider next screening in five years given the positive family     history of CRC.      ___________________________________________                                            Lionel December, M.D.   NR/MEDQ  D:  04/06/2004  T:  04/07/2004  Job:  [id number]   cc:   Corrie Mckusick, M.D.  [address] [place]. [place]  [place]  [place]  [address]  Fax: [phone]

## 2011-04-01 NOTE — Consult Note (Signed)
NAME:  RISHITH, SIDDOWAY NO.:  [id number]   MEDICAL RECORD NO.:  [id number]                   PATIENT TYPE:  INP   LOCATION:  A217                                 FACILITY:  [facility]   PHYSICIAN:  Vida Roller, M.D.                DATE OF BIRTH:  1929-02-13   DATE OF CONSULTATION:  07/01/2003  DATE OF DISCHARGE:                                   CONSULTATION   CARDIOLOGY CONSULTATION:   CONSULTING PHYSICIAN:  Vida Roller, M.D.   PRIMARY CARE [place]ane [place]t:  Dr. Geanie Cooley.   CARDIOLOGIST:  Dr. Juanito Doom.   HISTORY OF PRESENT ILLNESS:  Mr. Wehner presented to the emergency department  yesterday after eating a piece of watermelon which caused him to have  tachycardia which was associated with some left shoulder pain and  palpitations.  He reports that whenever he eats anything cold he gets atrial  fibrillation which has now been a pretty consistent pattern.  It used to  occur with cold drinks and also with ice cream.  He has been avoiding cold  substances but had a piece of cold watermelon yesterday which caused the  symptoms.  He has never had chest discomfort before with the tachycardia and  this is the first time he has ever had arm pain.  He has no known coronary  artery disease.  He is pain free now and has been since the cessation of the  tachycardia which only lasted about 20 minutes.   PAST MEDICAL HISTORY:  His past medical history is significant for  hypertension, atrial fibrillation which is paroxysmal, occasional  hypoglycemia and he is status post a discectomy about 20 years ago in his  back and is currently getting injections in his back for chronic low back  pain.   ALLERGIES:  He is allergic to LIDOCAINE, PENICILLIN, TETANUS, DECADRON,  MEPROBAMATE, TETANUS TOXOID and there is a DECONGESTANT he is not sure of  the name of that he is also allergic to.   MEDICATIONS:  His medications currently are Ziac 2.5 mg a day, Diovan 40 mg  a day,  Lasix 20 mg a day, aspirin 325 mg a day, he takes prednisone for his  back, he takes vitamin E, vitamin C.  Currently in the hospital he is on all  his medications except for the prednisone.   SOCIAL HISTORY:  He lives in [place] with his wife, he is retired, he is  married, has been for quite some time.  He used to smoke but quit 40 years  ago, has about a 15 pack-year smoking history, does not drink, does not use  illicit drugs.   FAMILY HISTORY:  His mother died at age 69 of Alzheimer's disease, father  died at age 30 of congestive heart failure.  He has one brother and six  sisters, none of whom have coronary artery disease.   REVIEW OF SYSTEMS:  Otherwise noncontributory.   PHYSICAL EXAMINATION:  GENERAL:  He is a well-developed mildly obese white  male in no apparent distress who is alert and oriented x4.  VITAL SIGNS:  His heart rate is 46 and sinus, respiratory rate is 18, and  his blood pressure is 144/78.  HEENT:  Exam is unremarkable.  NECK:  His neck is supple.  He has no jugular venous distention or carotid  bruits.  CHEST:  His chest is clear to auscultation.  CARDIAC:  His cardiac exam reveals a regular rate and rhythm with no  murmurs, rubs, or gallops.  ABDOMEN:  Soft, nontender, normoactive bowel sounds.  EXTREMITIES:  Lower extremities without significant clubbing, cyanosis, or  edema.  GU AND RECTAL:  Exams are deferred.  MUSCULOSKELETAL:  His musculoskeletal exam is unremarkable.  NEUROLOGICAL:  His neurologic exam is nonfocal.   CHEST RADIOGRAPH:  Normal.   ELECTROCARDIOGRAM:  Normal sinus rhythm at a rate of 83 with occasional  PACs, normal axes, normal intervals, no Q waves concerning for old  myocardial infarction and he has nonspecific ST-T wave changes.   LABORATORIES:  His white blood cell count is 12.3 with an H&H of 16 and 48,  platelet count of 187,000.  Sodium of 139, potassium of 3.9, chloride of  103, bicarb of 28, BUN and creatinine are 15  and 1.2 with a blood sugar of  156 nonfasting.  Two sets of cardiac enzymes are inconsistent with acute  myocardial infarction.  His PTT is 31, his PT is 13.5, his INR is 1.   ASSESSMENT/PLAN:  This is a gentleman with known atrial fibrillation who for  the first time has had some discomfort in his arm associated with rapid  ventricular rate of his atrial fibrillation.  We are going to assume this is  an ischemic equivalent and pursue it further.  It appears that he has not  had a myocardial infarction.  I have scheduled him today to have an  Adenosine-Cardiolite to assess his left ventricular function as well as his  perfusion through his myocardium.  If that is normal I would recommend no  further evaluation.                                               Vida Roller, M.D.    JH/MEDQ  D:  07/01/2003  T:  07/01/2003  Job:  [id number]

## 2011-04-06 ENCOUNTER — Ambulatory Visit (INDEPENDENT_AMBULATORY_CARE_PROVIDER_SITE_OTHER): Payer: Medicare Other | Admitting: *Deleted

## 2011-04-06 DIAGNOSIS — I4891 Unspecified atrial fibrillation: Secondary | ICD-10-CM

## 2011-04-06 DIAGNOSIS — Z7901 Long term (current) use of anticoagulants: Secondary | ICD-10-CM

## 2011-04-06 LAB — POCT INR: INR: 2.4

## 2011-05-04 ENCOUNTER — Ambulatory Visit (INDEPENDENT_AMBULATORY_CARE_PROVIDER_SITE_OTHER): Payer: Medicare Other | Admitting: *Deleted

## 2011-05-04 DIAGNOSIS — Z7901 Long term (current) use of anticoagulants: Secondary | ICD-10-CM

## 2011-05-04 DIAGNOSIS — I4891 Unspecified atrial fibrillation: Secondary | ICD-10-CM

## 2011-05-04 LAB — POCT INR: INR: 2.5

## 2011-05-24 ENCOUNTER — Other Ambulatory Visit: Payer: Self-pay | Admitting: Cardiology

## 2011-05-25 NOTE — Telephone Encounter (Signed)
Sent to wrong pool. \

## 2011-06-01 ENCOUNTER — Ambulatory Visit (INDEPENDENT_AMBULATORY_CARE_PROVIDER_SITE_OTHER): Payer: Medicare Other | Admitting: *Deleted

## 2011-06-01 DIAGNOSIS — Z7901 Long term (current) use of anticoagulants: Secondary | ICD-10-CM

## 2011-06-01 DIAGNOSIS — I4891 Unspecified atrial fibrillation: Secondary | ICD-10-CM

## 2011-06-29 ENCOUNTER — Ambulatory Visit (INDEPENDENT_AMBULATORY_CARE_PROVIDER_SITE_OTHER): Payer: Medicare Other | Admitting: *Deleted

## 2011-06-29 DIAGNOSIS — Z7901 Long term (current) use of anticoagulants: Secondary | ICD-10-CM

## 2011-06-29 DIAGNOSIS — I4891 Unspecified atrial fibrillation: Secondary | ICD-10-CM

## 2011-07-07 ENCOUNTER — Telehealth: Payer: Self-pay | Admitting: Cardiology

## 2011-07-07 NOTE — Telephone Encounter (Signed)
Per pt wife calling to schedule pt appt w/ Dr. Daleen Squibb as requested per pt reminder letter sent out. The soonest appt available was September 14th however pt has been c/o sob today. Today has been the first time pt has been experiencing sob. Please return pt call to advise/discuss.

## 2011-07-07 NOTE — Telephone Encounter (Signed)
Pt calls today b/c he was feeling short of breath and anxious this morning. He is not short of breath this afternoon. Medications reviewed with pt.  Pt says he has not been able to exercise lately and has "gained a lot of weight" b/c of his leg.  He denies edema and denies chest pain. His blood pressure at home was 150/80.  Appt made with Dr. Daleen Squibb on 07/29/11 at 10:45 am.  He will call [place] office and possibly follow-up with NP in [place] sooner-- as he lives in [place].  Mylo Red RN

## 2011-07-07 NOTE — Telephone Encounter (Signed)
No answer at home. Will call back. Mylo Red RN

## 2011-07-20 ENCOUNTER — Other Ambulatory Visit (HOSPITAL_COMMUNITY): Payer: Self-pay | Admitting: Family Medicine

## 2011-07-20 ENCOUNTER — Ambulatory Visit (HOSPITAL_COMMUNITY)
Admission: RE | Admit: 2011-07-20 | Discharge: 2011-07-20 | Disposition: A | Discharge status: Home / Self Care | Payer: Medicare Other | Source: Ambulatory Visit | Attending: Family Medicine | Admitting: Family Medicine

## 2011-07-20 DIAGNOSIS — M5137 Other intervertebral disc degeneration, lumbosacral region: Secondary | ICD-10-CM | POA: Insufficient documentation

## 2011-07-20 DIAGNOSIS — M25559 Pain in unspecified hip: Secondary | ICD-10-CM | POA: Insufficient documentation

## 2011-07-20 DIAGNOSIS — J069 Acute upper respiratory infection, unspecified: Secondary | ICD-10-CM

## 2011-07-20 DIAGNOSIS — M51379 Other intervertebral disc degeneration, lumbosacral region without mention of lumbar back pain or lower extremity pain: Secondary | ICD-10-CM | POA: Insufficient documentation

## 2011-07-20 DIAGNOSIS — R109 Unspecified abdominal pain: Secondary | ICD-10-CM

## 2011-07-20 DIAGNOSIS — M76899 Other specified enthesopathies of unspecified lower limb, excluding foot: Secondary | ICD-10-CM

## 2011-07-27 ENCOUNTER — Ambulatory Visit (INDEPENDENT_AMBULATORY_CARE_PROVIDER_SITE_OTHER): Payer: Medicare Other | Admitting: *Deleted

## 2011-07-27 DIAGNOSIS — I4891 Unspecified atrial fibrillation: Secondary | ICD-10-CM

## 2011-07-27 DIAGNOSIS — Z7901 Long term (current) use of anticoagulants: Secondary | ICD-10-CM

## 2011-07-29 ENCOUNTER — Encounter: Payer: Self-pay | Admitting: Cardiology

## 2011-07-29 ENCOUNTER — Ambulatory Visit (INDEPENDENT_AMBULATORY_CARE_PROVIDER_SITE_OTHER): Payer: Medicare Other | Admitting: Cardiology

## 2011-07-29 ENCOUNTER — Ambulatory Visit: Payer: Medicare Other | Admitting: Cardiology

## 2011-07-29 VITALS — BP 154/78 | HR 62 | Ht 70.0 in | Wt 260.0 lb

## 2011-07-29 DIAGNOSIS — I4891 Unspecified atrial fibrillation: Secondary | ICD-10-CM

## 2011-07-29 DIAGNOSIS — I10 Essential (primary) hypertension: Secondary | ICD-10-CM

## 2011-07-29 DIAGNOSIS — Z7901 Long term (current) use of anticoagulants: Secondary | ICD-10-CM

## 2011-07-29 LAB — HEPATIC FUNCTION PANEL
ALT: 15 U/L (ref 0–53)
Alkaline Phosphatase: 75 U/L (ref 39–117)
Bilirubin, Direct: 0.1 mg/dL (ref 0.0–0.3)
Total Bilirubin: 0.7 mg/dL (ref 0.3–1.2)
Total Protein: 7.1 g/dL (ref 6.0–8.3)

## 2011-07-29 LAB — BASIC METABOLIC PANEL
BUN: 19 mg/dL (ref 6–23)
Chloride: 100 mEq/L (ref 96–112)
Glucose, Bld: 107 mg/dL — ABNORMAL HIGH (ref 70–99)
Potassium: 4.2 mEq/L (ref 3.5–5.1)
Sodium: 137 mEq/L (ref 135–145)

## 2011-07-29 NOTE — Progress Notes (Signed)
HPI Mr. Chad [name] returns today for evaluation and management of his paroxysmal A. Fib. He continues to be on low dose amiodarone. He has occasional episodes of palpitations but they're short lived.  Present no nausea vomiting abdominal pain. He was recently diagnosed with diverticular disease. He is on full dose aspirin as well as Coumadin. There is no history of coronary disease. Reports no bleeding.  EKG shows normal sinus rhythm with an incomplete right bundle nonspecific ST segment changes laterally, no significant change. Past Medical History  Diagnosis Date  . Arrhythmia     atrial fib  . Hypertension   . Hypoglycemia     Past Surgical History  Procedure Date  . Lumbar disc surgery 1991    Family History  Problem Relation Age of Onset  . Heart failure Father     History   Social History  . Marital Status: Married    Spouse Name: N/A    Number of Children: N/A  . Years of Education: N/A   Occupational History  . Not on file.   Social History Main Topics  . Smoking status: Former Smoker    Types: Cigarettes    Quit date: 12/13/1970  . Smokeless tobacco: Not on file  . Alcohol Use: No  . Drug Use: No  . Sexually Active:    Other Topics Concern  . Not on file   Social History Narrative  . No narrative on file    Allergies  Allergen Reactions  . Flagyl (Metronidazole) Nausea Only  . Lidocaine   . Penicillins   . Sulfonamide Derivatives   . Tetanus Toxoid     Current Outpatient Prescriptions  Medication Sig Dispense Refill  . amiodarone (PACERONE) 200 MG tablet TAKE 1 TABLET BY MOUTH EVERY DAY  30 tablet  3  . aspirin 325 MG tablet Take 325 mg by mouth daily.        . Calcium-Magnesium 500-250 MG TABS Take 1 tablet by mouth daily.        . Cholecalciferol 1000 UNIT capsule Take 1,000 Units by mouth 2 (two) times daily.        . Coenzyme Q10 (EQL COQ10) 100 MG capsule Take 100 mg by mouth daily.        . Cyanocobalamin (VITAMIN B 12 PO) Take 100 mcg by  mouth daily.        Marland Kitchen escitalopram (LEXAPRO) 10 MG tablet Take 10 mg by mouth daily.        . Multiple Vitamins-Minerals (CENTRUM SILVER PO) Take 1 tablet by mouth daily.        . valsartan (DIOVAN) 160 MG tablet Take 160 mg by mouth 2 (two) times daily.        Marland Kitchen warfarin (COUMADIN) 5 MG tablet Take 5 mg by mouth daily. As directed by Anticoagulation clinic.         ROS Negative other than HPI.   PE General Appearance: well developed, well nourished in no acute distress HEENT: symmetrical face, PERRLA, good dentition  Neck: no JVD, thyromegaly, or adenopathy, trachea midline Chest: symmetric without deformity Cardiac: PMI non-displaced, RRR, normal S1, S2, no gallop or murmur Lung: clear to ausculation and percussion Vascular: all pulses full without bruits  Abdominal: nondistended, nontender, good bowel sounds, no HSM, no bruits Extremities: no cyanosis, clubbing or edema, no sign of DVT, no varicosities  Skin: normal color, no rashes Neuro: alert and oriented x 3, non-focal Pysch: normal affect Filed Vitals:   07/29/11 1047  BP: 154/78  Pulse: 62  Height: 5\' 10"  (1.778 m)  Weight: 260 lb (117.935 kg)    EKG  Labs and Studies Reviewed.   Lab Results  Component Value Date   WBC 6.3 10/22/2010   HGB 15.5 10/22/2010   HCT 44.2 10/22/2010   MCV 83.4 10/22/2010   PLT 168 10/22/2010      Chemistry      Component Value Date/Time   NA 139 01/12/2011 1157   K 4.4 01/12/2011 1157   CL 103 01/12/2011 1157   CO2 31 01/12/2011 1157   BUN 15 01/12/2011 1157   CREATININE 1.3 01/12/2011 1157      Component Value Date/Time   CALCIUM 9.2 01/12/2011 1157   ALKPHOS 56 01/12/2011 1157   AST 22 01/12/2011 1157   ALT 17 01/12/2011 1157   BILITOT 0.8 01/12/2011 1157       Lab Results  Component Value Date   CHOL 179 06/10/2010   Lab Results  Component Value Date   HDL 46 06/10/2010   Lab Results  Component Value Date   LDLCALC 118 06/10/2010   Lab Results  Component Value Date    TRIG 75 06/10/2010   No results found for this basename: CHOLHDL   No results found for this basename: HGBA1C   Lab Results  Component Value Date   ALT 17 01/12/2011   AST 22 01/12/2011   ALKPHOS 56 01/12/2011   BILITOT 0.8 01/12/2011   Lab Results  Component Value Date   TSH 2.42 01/12/2011

## 2011-07-29 NOTE — Patient Instructions (Addendum)
Your physician recommends that you return for lab work in: tsh,t4,bmp liver.  We will call you with the results of your test.  Your physician recommends that you schedule a follow-up appointment in: 6 months with Dr. Daleen Squibb  Your physician has recommended you make the following change in your medication:  stop taking aspirin

## 2011-07-29 NOTE — Assessment & Plan Note (Signed)
No current indication for both aspirin and Coumadin.  Increased bleeding risk with diverticular disease. We'll discontinue aspirin.

## 2011-07-29 NOTE — Assessment & Plan Note (Signed)
Stable. Check amiodarone laboratory data today. No change in therapy. Followup with [place] in 6 months.

## 2011-08-24 ENCOUNTER — Other Ambulatory Visit: Payer: Self-pay | Admitting: *Deleted

## 2011-08-24 ENCOUNTER — Ambulatory Visit (INDEPENDENT_AMBULATORY_CARE_PROVIDER_SITE_OTHER): Payer: Medicare Other | Admitting: *Deleted

## 2011-08-24 DIAGNOSIS — I4891 Unspecified atrial fibrillation: Secondary | ICD-10-CM

## 2011-08-24 DIAGNOSIS — Z7901 Long term (current) use of anticoagulants: Secondary | ICD-10-CM

## 2011-08-24 MED ORDER — VALSARTAN 160 MG PO TABS: 160.0000 mg | ORAL_TABLET | Freq: Two times a day (BID) | ORAL | Status: DC

## 2011-09-21 ENCOUNTER — Ambulatory Visit (INDEPENDENT_AMBULATORY_CARE_PROVIDER_SITE_OTHER): Payer: Medicare Other | Admitting: *Deleted

## 2011-09-21 DIAGNOSIS — I4891 Unspecified atrial fibrillation: Secondary | ICD-10-CM

## 2011-09-21 DIAGNOSIS — Z7901 Long term (current) use of anticoagulants: Secondary | ICD-10-CM

## 2011-09-21 LAB — POCT INR: INR: 2.7

## 2011-09-23 ENCOUNTER — Other Ambulatory Visit (HOSPITAL_COMMUNITY): Payer: Self-pay | Admitting: Internal Medicine

## 2011-09-23 DIAGNOSIS — R109 Unspecified abdominal pain: Secondary | ICD-10-CM

## 2011-09-23 DIAGNOSIS — IMO0002 Reserved for concepts with insufficient information to code with codable children: Secondary | ICD-10-CM

## 2011-09-26 ENCOUNTER — Other Ambulatory Visit: Payer: Self-pay | Admitting: Cardiology

## 2011-09-27 ENCOUNTER — Ambulatory Visit (HOSPITAL_COMMUNITY)
Admission: RE | Admit: 2011-09-27 | Discharge: 2011-09-27 | Disposition: A | Discharge status: Home / Self Care | Payer: Medicare Other | Source: Ambulatory Visit | Attending: Internal Medicine | Admitting: Internal Medicine

## 2011-09-27 DIAGNOSIS — R933 Abnormal findings on diagnostic imaging of other parts of digestive tract: Secondary | ICD-10-CM | POA: Insufficient documentation

## 2011-09-27 DIAGNOSIS — K5730 Diverticulosis of large intestine without perforation or abscess without bleeding: Secondary | ICD-10-CM | POA: Insufficient documentation

## 2011-09-27 DIAGNOSIS — I10 Essential (primary) hypertension: Secondary | ICD-10-CM | POA: Insufficient documentation

## 2011-09-27 DIAGNOSIS — IMO0002 Reserved for concepts with insufficient information to code with codable children: Secondary | ICD-10-CM

## 2011-09-27 DIAGNOSIS — R109 Unspecified abdominal pain: Secondary | ICD-10-CM | POA: Insufficient documentation

## 2011-09-27 DIAGNOSIS — N281 Cyst of kidney, acquired: Secondary | ICD-10-CM | POA: Insufficient documentation

## 2011-10-03 ENCOUNTER — Ambulatory Visit (INDEPENDENT_AMBULATORY_CARE_PROVIDER_SITE_OTHER): Payer: Medicare Other | Admitting: *Deleted

## 2011-10-03 DIAGNOSIS — I4891 Unspecified atrial fibrillation: Secondary | ICD-10-CM

## 2011-10-03 DIAGNOSIS — Z7901 Long term (current) use of anticoagulants: Secondary | ICD-10-CM

## 2011-10-03 LAB — POCT INR: INR: 2.8

## 2011-11-14 ENCOUNTER — Ambulatory Visit (INDEPENDENT_AMBULATORY_CARE_PROVIDER_SITE_OTHER): Payer: Medicare Other | Admitting: *Deleted

## 2011-11-14 DIAGNOSIS — I4891 Unspecified atrial fibrillation: Secondary | ICD-10-CM

## 2011-11-14 DIAGNOSIS — Z7901 Long term (current) use of anticoagulants: Secondary | ICD-10-CM

## 2011-12-26 ENCOUNTER — Ambulatory Visit (INDEPENDENT_AMBULATORY_CARE_PROVIDER_SITE_OTHER): Payer: Medicare Other | Admitting: *Deleted

## 2011-12-26 DIAGNOSIS — Z7901 Long term (current) use of anticoagulants: Secondary | ICD-10-CM

## 2011-12-26 DIAGNOSIS — I4891 Unspecified atrial fibrillation: Secondary | ICD-10-CM

## 2011-12-26 LAB — POCT INR: INR: 2.1

## 2012-02-03 ENCOUNTER — Encounter: Payer: Self-pay | Admitting: Cardiology

## 2012-02-03 ENCOUNTER — Ambulatory Visit (INDEPENDENT_AMBULATORY_CARE_PROVIDER_SITE_OTHER): Payer: Medicare Other | Admitting: Adult Health

## 2012-02-03 VITALS — BP 165/82 | HR 68 | Ht 70.0 in | Wt 260.0 lb

## 2012-02-03 DIAGNOSIS — I4891 Unspecified atrial fibrillation: Secondary | ICD-10-CM

## 2012-02-03 DIAGNOSIS — I10 Essential (primary) hypertension: Secondary | ICD-10-CM

## 2012-02-03 NOTE — Assessment & Plan Note (Signed)
Mildly elevated at this time.  Will continue to monitor this.No changes at this time.

## 2012-02-03 NOTE — Progress Notes (Signed)
   HPI: Mr. Chad [name] is a friendly 78 y/o patient of Dr. Daleen Squibb we are seeing for ongoing assessment and treatment atrial fibrillation and hypertension.  He is followed in our coumadin clinic. He has been without cardiac complaint of palpitations, chest pain, shortness of breath or dizziness. He has been having arthritis issues in his back affecting his walking and causing pain in his left hip. He has gained some weight from inactivity, but has become more active.  He is seeing a pain management physician, Dr. Laurian Brim who is considering doing a spinal block for pain control should this be necessary. He does not wish to pursue this now. He is medically compliant and sees Dr. Phillips Odor for labs every 6 months.   Allergies  Allergen Reactions  . Flagyl (Metronidazole) Nausea Only  . Lidocaine   . Penicillins   . Sulfonamide Derivatives   . Tetanus Toxoid     Current Outpatient Prescriptions  Medication Sig Dispense Refill  . ALPRAZolam (XANAX) 0.5 MG tablet       . amiodarone (PACERONE) 200 MG tablet TAKE 1 TABLET BY MOUTH EVERY DAY  30 tablet  5  . Calcium-Magnesium 500-250 MG TABS Take 1 tablet by mouth daily.        . Cholecalciferol 1000 UNIT capsule Take 1,000 Units by mouth 2 (two) times daily.        . Coenzyme Q10 (EQL COQ10) 100 MG capsule Take 100 mg by mouth daily.        . Cyanocobalamin (VITAMIN B 12 PO) Take 100 mcg by mouth daily.        Marland Kitchen escitalopram (LEXAPRO) 10 MG tablet Take 10 mg by mouth daily.        . Multiple Vitamins-Minerals (CENTRUM SILVER PO) Take 1 tablet by mouth daily.        . valsartan (DIOVAN) 160 MG tablet Take 1 tablet (160 mg total) by mouth 2 (two) times daily.  60 tablet  6  . warfarin (COUMADIN) 5 MG tablet Take 5 mg by mouth daily. As directed by Anticoagulation clinic.         Past Medical History  Diagnosis Date  . Arrhythmia     atrial fib  . Hypertension   . Hypoglycemia     Past Surgical History  Procedure Date  . Lumbar disc surgery 1991     ZOX:[id number] of systems complete and found to be negative unless listed above PHYSICAL EXAM BP 165/82  Pulse 68  Ht 5\' 10"  (1.778 m)  Wt 260 lb (117.935 kg)  BMI 37.31 kg/m2  General: Well developed, well nourished, in no acute distress Head: Eyes PERRLA, No xanthomas.   Normal cephalic and atramatic  Lungs: Clear bilaterally to auscultation and percussion. Heart: HRRR S1 S2, without MRG.  Pulses are 2+ & equal.            No carotid bruit. No JVD.  No abdominal bruits. No femoral bruits. Abdomen: Bowel sounds are positive, abdomen soft and non-tender without masses or                  Hernia's noted. Msk:  Back normal, normal gait. Normal strength and tone for age. Extremities: No clubbing, cyanosis or edema.  DP +1 Neuro: Alert and oriented X 3. Psych:  Good affect, responds appropriately EKG: NSR rate of 62 bpm. ICRBB  ASSESSMENT AND PLAN

## 2012-02-03 NOTE — Assessment & Plan Note (Signed)
He remains in NSR and is tolerating amiodarone without shortness of breath. He continues in the coumadin clinic and has no complaints of bleeding or excessive bruising. He will continue current medications as directed. Will need TSH and liver enzymes with next blood draw per Dr. Phillips Odor. He has also been seen by Dr. Daleen Squibb today who agrees with this assessment and plan.

## 2012-02-06 ENCOUNTER — Ambulatory Visit (INDEPENDENT_AMBULATORY_CARE_PROVIDER_SITE_OTHER): Payer: Medicare Other | Admitting: *Deleted

## 2012-02-06 DIAGNOSIS — I4891 Unspecified atrial fibrillation: Secondary | ICD-10-CM

## 2012-02-06 DIAGNOSIS — Z7901 Long term (current) use of anticoagulants: Secondary | ICD-10-CM

## 2012-02-06 LAB — POCT INR: INR: 2.3

## 2012-02-21 ENCOUNTER — Other Ambulatory Visit: Payer: Self-pay | Admitting: Physician Assistant

## 2012-03-19 ENCOUNTER — Ambulatory Visit (INDEPENDENT_AMBULATORY_CARE_PROVIDER_SITE_OTHER): Payer: Medicare Other | Admitting: *Deleted

## 2012-03-19 DIAGNOSIS — I4891 Unspecified atrial fibrillation: Secondary | ICD-10-CM

## 2012-03-19 DIAGNOSIS — Z7901 Long term (current) use of anticoagulants: Secondary | ICD-10-CM

## 2012-03-19 LAB — POCT INR: INR: 2.6

## 2012-03-30 ENCOUNTER — Other Ambulatory Visit: Payer: Self-pay | Admitting: Cardiology

## 2012-04-30 ENCOUNTER — Ambulatory Visit (INDEPENDENT_AMBULATORY_CARE_PROVIDER_SITE_OTHER): Payer: Medicare Other | Admitting: *Deleted

## 2012-04-30 DIAGNOSIS — Z7901 Long term (current) use of anticoagulants: Secondary | ICD-10-CM

## 2012-04-30 DIAGNOSIS — I4891 Unspecified atrial fibrillation: Secondary | ICD-10-CM

## 2012-06-11 ENCOUNTER — Ambulatory Visit (INDEPENDENT_AMBULATORY_CARE_PROVIDER_SITE_OTHER): Payer: Medicare Other | Admitting: *Deleted

## 2012-06-11 DIAGNOSIS — I4891 Unspecified atrial fibrillation: Secondary | ICD-10-CM

## 2012-06-11 DIAGNOSIS — Z7901 Long term (current) use of anticoagulants: Secondary | ICD-10-CM

## 2012-06-26 ENCOUNTER — Encounter: Payer: Self-pay | Admitting: Cardiology

## 2012-06-26 ENCOUNTER — Ambulatory Visit (INDEPENDENT_AMBULATORY_CARE_PROVIDER_SITE_OTHER): Payer: Medicare Other | Admitting: Cardiology

## 2012-06-26 VITALS — BP 158/88 | HR 71 | Wt 262.0 lb

## 2012-06-26 DIAGNOSIS — I4891 Unspecified atrial fibrillation: Secondary | ICD-10-CM

## 2012-06-26 DIAGNOSIS — I10 Essential (primary) hypertension: Secondary | ICD-10-CM

## 2012-06-26 DIAGNOSIS — Z7901 Long term (current) use of anticoagulants: Secondary | ICD-10-CM

## 2012-06-26 DIAGNOSIS — E783 Hyperchylomicronemia: Secondary | ICD-10-CM

## 2012-06-26 NOTE — Progress Notes (Signed)
HPI Chad [name] returns today for evaluation and management of his paroxysmal A. fib well controlled with amiodarone. He's also on anticoagulation which is followed here in the office.  He has an occasional palpitation but overall has been doing well. He denies any orthopnea, PND, edema, syncope or presyncope. He denies any chest pain. He's had no bleeding.  His blood work is checked by Dr. Phillips Odor a primary care. We've advised him to have his LFTs and his thyroid checked every 6 months on amiodarone.  Past Medical History  Diagnosis Date  . Arrhythmia     atrial fib  . Hypertension   . Hypoglycemia     Current Outpatient Prescriptions  Medication Sig Dispense Refill  . ALPRAZolam (XANAX) 0.5 MG tablet       . amiodarone (PACERONE) 200 MG tablet TAKE 1 TABLET BY MOUTH EVERY DAY  30 tablet  5  . Calcium-Magnesium 500-250 MG TABS Take 1 tablet by mouth daily.        . Cholecalciferol 1000 UNIT capsule Take 1,000 Units by mouth 2 (two) times daily.        . Coenzyme Q10 (EQL COQ10) 100 MG capsule Take 100 mg by mouth daily.        . Cyanocobalamin (VITAMIN B 12 PO) Take 100 mcg by mouth daily.        Marland Kitchen DIOVAN 160 MG tablet TAKE 1 TABLET (160 MG TOTAL) BY MOUTH 2 (TWO) TIMES DAILY.  60 tablet  6  . escitalopram (LEXAPRO) 10 MG tablet Take 10 mg by mouth daily.        . Multiple Vitamins-Minerals (CENTRUM SILVER PO) Take 1 tablet by mouth daily.        Marland Kitchen warfarin (COUMADIN) 5 MG tablet Take 5 mg by mouth daily. As directed by Anticoagulation clinic.         Allergies  Allergen Reactions  . Flagyl (Metronidazole) Nausea Only  . Lidocaine   . Penicillins   . Sulfonamide Derivatives   . Tetanus Toxoid     Family History  Problem Relation Age of Onset  . Heart failure Father     History   Social History  . Marital Status: Married    Spouse Name: N/A    Number of Children: N/A  . Years of Education: N/A   Occupational History  . Not on file.   Social History Main Topics  .  Smoking status: Former Smoker    Types: Cigarettes    Quit date: 12/13/1970  . Smokeless tobacco: Not on file  . Alcohol Use: No  . Drug Use: No  . Sexually Active:    Other Topics Concern  . Not on file   Social History Narrative  . No narrative on file    ROS ALL NEGATIVE EXCEPT THOSE NOTED IN HPI  PE  General Appearance: well developed, well nourished in no acute distress, overweight HEENT: symmetrical face, PERRLA, good dentition  Neck: no JVD, thyromegaly, or adenopathy, trachea midline Chest: symmetric without deformity Cardiac: PMI non-displaced, RRR, normal S1, S2, no gallop or murmur Lung: clear to ausculation and percussion Vascular: all pulses full without bruits  Abdominal: nondistended, nontender, good bowel sounds, no HSM, no bruits Extremities: no cyanosis, clubbing or edema, no sign of DVT, no varicosities  Skin: normal color, no rashes Neuro: alert and oriented x 3, non-focal Pysch: normal affect  EKG Normal sinus rhythm, nonspecific ST segment changes, QTC 490. BMET    Component Value Date/Time   NA 137  07/29/2011 1126   K 4.2 07/29/2011 1126   CL 100 07/29/2011 1126   CO2 29 07/29/2011 1126   GLUCOSE 107* 07/29/2011 1126   BUN 19 07/29/2011 1126   CREATININE 1.2 07/29/2011 1126   CALCIUM 9.2 07/29/2011 1126   GFRNONAA 53* 10/22/2010 0057   GFRAA  Value: >60        The eGFR has been calculated using the MDRD equation. This calculation has not been validated in all clinical situations. eGFR's persistently <60 mL/min signify possible Chronic Kidney Disease. 10/22/2010 0057    Lipid Panel     Component Value Date/Time   CHOL 179 06/10/2010 1617   TRIG 75 06/10/2010 1617   HDL 46 06/10/2010 1617   LDLCALC 118 06/10/2010 1617    CBC    Component Value Date/Time   WBC 6.3 10/22/2010 0057   RBC 5.30 10/22/2010 0057   HGB 15.5 10/22/2010 0057   HCT 44.2 10/22/2010 0057   PLT 168 10/22/2010 0057   MCV 83.4 10/22/2010 0057   MCH 29.2 10/22/2010 0057   MCHC 35.1  10/22/2010 0057   RDW 16.2* 10/22/2010 0057   LYMPHSABS 1.8 10/22/2010 0057   MONOABS 1.1* 10/22/2010 0057   EOSABS 0.1 10/22/2010 0057   BASOSABS 0.0 10/22/2010 0057

## 2012-06-26 NOTE — Assessment & Plan Note (Signed)
Maintaining sinus rhythm with very few palpitations. Continue current medications. Patient advised to have his LFTs and thyroid checked every 6 months Dr. Lamar Blinks office. I'll see him back again in 6 months.

## 2012-06-26 NOTE — Patient Instructions (Addendum)
**Note De-Identified  Obfuscation** Your physician recommends that you continue on your current medications as directed. Please refer to the Current Medication list given to you today.  Your physician recommends that you schedule a follow-up appointment in: 6 months  Reminder: Please ask Dr. Sherwood Gambler to check LFT's and TSH every 6 months

## 2012-07-03 ENCOUNTER — Ambulatory Visit: Payer: Medicare Other | Admitting: Cardiology

## 2012-07-23 ENCOUNTER — Ambulatory Visit (INDEPENDENT_AMBULATORY_CARE_PROVIDER_SITE_OTHER): Payer: Medicare Other | Admitting: *Deleted

## 2012-07-23 DIAGNOSIS — Z7901 Long term (current) use of anticoagulants: Secondary | ICD-10-CM

## 2012-07-23 DIAGNOSIS — I4891 Unspecified atrial fibrillation: Secondary | ICD-10-CM

## 2012-07-23 LAB — POCT INR: INR: 3.3

## 2012-07-24 ENCOUNTER — Other Ambulatory Visit: Payer: Self-pay | Admitting: Physician Assistant

## 2012-08-22 ENCOUNTER — Ambulatory Visit (INDEPENDENT_AMBULATORY_CARE_PROVIDER_SITE_OTHER): Payer: Medicare Other | Admitting: *Deleted

## 2012-08-22 DIAGNOSIS — Z7901 Long term (current) use of anticoagulants: Secondary | ICD-10-CM

## 2012-08-22 DIAGNOSIS — I4891 Unspecified atrial fibrillation: Secondary | ICD-10-CM

## 2012-09-19 ENCOUNTER — Ambulatory Visit (INDEPENDENT_AMBULATORY_CARE_PROVIDER_SITE_OTHER): Payer: Medicare Other | Admitting: *Deleted

## 2012-09-19 DIAGNOSIS — I4891 Unspecified atrial fibrillation: Secondary | ICD-10-CM

## 2012-09-19 DIAGNOSIS — Z7901 Long term (current) use of anticoagulants: Secondary | ICD-10-CM

## 2012-10-17 ENCOUNTER — Ambulatory Visit (INDEPENDENT_AMBULATORY_CARE_PROVIDER_SITE_OTHER): Payer: Medicare Other | Admitting: *Deleted

## 2012-10-17 DIAGNOSIS — Z7901 Long term (current) use of anticoagulants: Secondary | ICD-10-CM

## 2012-10-17 DIAGNOSIS — I4891 Unspecified atrial fibrillation: Secondary | ICD-10-CM

## 2012-10-17 LAB — POCT INR: INR: 2.3

## 2012-10-31 ENCOUNTER — Other Ambulatory Visit: Payer: Self-pay | Admitting: *Deleted

## 2012-10-31 ENCOUNTER — Other Ambulatory Visit: Payer: Self-pay | Admitting: Physician Assistant

## 2012-10-31 MED ORDER — VALSARTAN 160 MG PO TABS: 160.0000 mg | ORAL_TABLET | Freq: Two times a day (BID) | ORAL | Status: DC

## 2012-10-31 MED ORDER — AMIODARONE HCL 200 MG PO TABS: 200.0000 mg | ORAL_TABLET | Freq: Every day | ORAL | Status: DC

## 2012-11-15 ENCOUNTER — Ambulatory Visit (INDEPENDENT_AMBULATORY_CARE_PROVIDER_SITE_OTHER): Payer: Medicare Other | Admitting: *Deleted

## 2012-11-15 DIAGNOSIS — I4891 Unspecified atrial fibrillation: Secondary | ICD-10-CM

## 2012-11-15 DIAGNOSIS — Z7901 Long term (current) use of anticoagulants: Secondary | ICD-10-CM

## 2012-11-15 LAB — POCT INR: INR: 2.1

## 2012-12-14 ENCOUNTER — Telehealth: Payer: Self-pay | Admitting: *Deleted

## 2012-12-14 ENCOUNTER — Telehealth: Payer: Self-pay | Admitting: Cardiology

## 2012-12-14 NOTE — Telephone Encounter (Signed)
Walk In Pt Form " Pt has Questions about BP medications"  Sent to Message Nurse 12/14/12/KM

## 2012-12-14 NOTE — Telephone Encounter (Signed)
Pt dropped off paperwork from his insurance stating that DIOVAN will not be covered by his insurance and will cost him 356.99 each month, please review and advise, will forward to Dr/nurse

## 2012-12-18 NOTE — Telephone Encounter (Signed)
No pharmacist this am, will have her address this afternoon.

## 2012-12-18 NOTE — Telephone Encounter (Signed)
Make appropriate generic switch and dosage with pharmacist input.

## 2012-12-18 NOTE — Telephone Encounter (Signed)
Per Pharmacist and Dr. Daleen Squibb, switch patient to Avapro 150mg  BID OR Avapro 300mg  daily.  Attempted to call pt but no answer or voicemail.

## 2012-12-19 MED ORDER — IRBESARTAN 150 MG PO TABS: 150.0000 mg | ORAL_TABLET | Freq: Two times a day (BID) | ORAL | Status: DC

## 2012-12-20 ENCOUNTER — Ambulatory Visit (INDEPENDENT_AMBULATORY_CARE_PROVIDER_SITE_OTHER): Payer: Medicare Other | Admitting: *Deleted

## 2012-12-20 DIAGNOSIS — Z7901 Long term (current) use of anticoagulants: Secondary | ICD-10-CM

## 2012-12-20 DIAGNOSIS — I4891 Unspecified atrial fibrillation: Secondary | ICD-10-CM

## 2013-01-02 ENCOUNTER — Ambulatory Visit (INDEPENDENT_AMBULATORY_CARE_PROVIDER_SITE_OTHER): Payer: Medicare Other | Admitting: *Deleted

## 2013-01-02 ENCOUNTER — Ambulatory Visit (INDEPENDENT_AMBULATORY_CARE_PROVIDER_SITE_OTHER): Payer: Medicare Other | Admitting: Cardiology

## 2013-01-02 ENCOUNTER — Encounter: Payer: Self-pay | Admitting: Cardiology

## 2013-01-02 VITALS — BP 138/72 | HR 68 | Ht 70.5 in | Wt 266.0 lb

## 2013-01-02 DIAGNOSIS — Z7901 Long term (current) use of anticoagulants: Secondary | ICD-10-CM

## 2013-01-02 DIAGNOSIS — I4891 Unspecified atrial fibrillation: Secondary | ICD-10-CM

## 2013-01-02 DIAGNOSIS — I480 Paroxysmal atrial fibrillation: Secondary | ICD-10-CM

## 2013-01-02 DIAGNOSIS — I10 Essential (primary) hypertension: Secondary | ICD-10-CM

## 2013-01-02 DIAGNOSIS — R5381 Other malaise: Secondary | ICD-10-CM

## 2013-01-02 LAB — POCT INR: INR: 1.9

## 2013-01-02 NOTE — Progress Notes (Signed)
HPI Chad [name] returns today for evaluation and management of his paroxysmal atrial fibrillation. He's doing remarkably well and denies any symptoms of atrial fib other than intermittent palpitations which he has had for years. They are short lived. He has done well on amiodarone. Blood work by primary care in August 2013 showed normal LFTs are normal TSH. Lipids were unremarkable with a normal triglyceride level.  He denies any problems with bleeding or melena. He still remains fairly active.  His blood pressures been under good control. He is very compliant with his medical program.  Past Medical History  Diagnosis Date  . Arrhythmia     atrial fib  . Hypertension   . Hypoglycemia     Current Outpatient Prescriptions  Medication Sig Dispense Refill  . ALPRAZolam (XANAX) 0.5 MG tablet Take 0.5 mg by mouth as needed.       Marland Kitchen amiodarone (PACERONE) 200 MG tablet Take 1 tablet (200 mg total) by mouth daily.  30 tablet  5  . Calcium-Magnesium 500-250 MG TABS Take 1 tablet by mouth daily.        . Cholecalciferol 1000 UNIT capsule Take 1,000 Units by mouth 2 (two) times daily.        . Coenzyme Q10 (EQL COQ10) 100 MG capsule Take 100 mg by mouth daily.        . Cyanocobalamin (VITAMIN B 12 PO) Take 100 mcg by mouth daily.        Marland Kitchen escitalopram (LEXAPRO) 10 MG tablet Take 10 mg by mouth daily.        . irbesartan (AVAPRO) 150 MG tablet Take 1 tablet (150 mg total) by mouth 2 (two) times daily.  180 tablet  3  . Multiple Vitamins-Minerals (CENTRUM SILVER PO) Take 1 tablet by mouth daily.        Marland Kitchen warfarin (COUMADIN) 5 MG tablet Take 5 mg by mouth daily. As directed by Anticoagulation clinic.        No current facility-administered medications for this visit.    Allergies  Allergen Reactions  . Flagyl (Metronidazole) Nausea Only  . Lidocaine   . Penicillins   . Sulfonamide Derivatives   . Tetanus Toxoid     Family History  Problem Relation Age of Onset  . Heart failure Father      History   Social History  . Marital Status: Married    Spouse Name: N/A    Number of Children: N/A  . Years of Education: N/A   Occupational History  . Not on file.   Social History Main Topics  . Smoking status: Former Smoker    Types: Cigarettes    Quit date: 12/13/1970  . Smokeless tobacco: Not on file  . Alcohol Use: No  . Drug Use: No  . Sexually Active:    Other Topics Concern  . Not on file   Social History Narrative  . No narrative on file    ROS ALL NEGATIVE EXCEPT THOSE NOTED IN HPI  PE  General Appearance: well developed, well nourished in no acute distress, obese HEENT: symmetrical face, PERRLA, good dentition  Neck: no JVD, thyromegaly, or adenopathy, trachea midline Chest: symmetric without deformity Cardiac: PMI non-displaced, RRR, normal S1, S2, no gallop or murmur Lung: clear to ausculation and percussion Vascular: all pulses full without bruits  Abdominal: nondistended, nontender, good bowel sounds, no HSM, no bruits Extremities: no cyanosis, clubbing or edema, no sign of DVT, no varicosities  Skin: normal color, no rashes Neuro: alert and  oriented x 3, non-focal Pysch: normal affect  EKG  Normal sinus rhythm, RSR prime V1, QTC 444  BMET    Component Value Date/Time   NA 137 07/29/2011 1126   K 4.2 07/29/2011 1126   CL 100 07/29/2011 1126   CO2 29 07/29/2011 1126   GLUCOSE 107* 07/29/2011 1126   BUN 19 07/29/2011 1126   CREATININE 1.2 07/29/2011 1126   CALCIUM 9.2 07/29/2011 1126   GFRNONAA 53* 10/22/2010 0057   GFRAA  Value: >60        The eGFR has been calculated using the MDRD equation. This calculation has not been validated in all clinical situations. eGFR's persistently <60 mL/min signify possible Chronic Kidney Disease. 10/22/2010 0057    Lipid Panel     Component Value Date/Time   CHOL 179 06/10/2010 1617   TRIG 75 06/10/2010 1617   HDL 46 06/10/2010 1617   LDLCALC 118 06/10/2010 1617    CBC    Component Value Date/Time    WBC 6.3 10/22/2010 0057   RBC 5.30 10/22/2010 0057   HGB 15.5 10/22/2010 0057   HCT 44.2 10/22/2010 0057   PLT 168 10/22/2010 0057   MCV 83.4 10/22/2010 0057   MCH 29.2 10/22/2010 0057   MCHC 35.1 10/22/2010 0057   RDW 16.2* 10/22/2010 0057   LYMPHSABS 1.8 10/22/2010 0057   MONOABS 1.1* 10/22/2010 0057   EOSABS 0.1 10/22/2010 0057   BASOSABS 0.0 10/22/2010 0057

## 2013-01-02 NOTE — Patient Instructions (Addendum)
Your physician recommends that you schedule a follow-up appointment in: 6 MONTHS  Your physician recommends that you return for lab work in: TODAY (THIS WEEK) SLIPS GIVEN

## 2013-01-02 NOTE — Assessment & Plan Note (Signed)
Maintain normal sinus rhythm on low-dose amiodarone. Continue same medications including anticoagulation. We'll recheck LFTs and a TSH and a free T4. Return the office in 6 months.

## 2013-01-03 ENCOUNTER — Encounter: Payer: Self-pay | Admitting: *Deleted

## 2013-01-03 LAB — T4, FREE: Free T4: 1.04 ng/dL (ref 0.80–1.80)

## 2013-01-03 LAB — HEPATIC FUNCTION PANEL
AST: 18 U/L (ref 0–37)
Albumin: 4.3 g/dL (ref 3.5–5.2)
Bilirubin, Direct: 0.1 mg/dL (ref 0.0–0.3)
Total Bilirubin: 0.7 mg/dL (ref 0.3–1.2)

## 2013-01-03 LAB — TSH: TSH: 2.158 u[IU]/mL (ref 0.350–4.500)

## 2013-01-23 ENCOUNTER — Ambulatory Visit (INDEPENDENT_AMBULATORY_CARE_PROVIDER_SITE_OTHER): Payer: Medicare Other | Admitting: *Deleted

## 2013-01-23 DIAGNOSIS — Z7901 Long term (current) use of anticoagulants: Secondary | ICD-10-CM

## 2013-01-23 DIAGNOSIS — I4891 Unspecified atrial fibrillation: Secondary | ICD-10-CM

## 2013-01-23 LAB — POCT INR: INR: 2.3

## 2013-02-20 ENCOUNTER — Ambulatory Visit (INDEPENDENT_AMBULATORY_CARE_PROVIDER_SITE_OTHER): Payer: Medicare Other | Admitting: *Deleted

## 2013-02-20 DIAGNOSIS — I4891 Unspecified atrial fibrillation: Secondary | ICD-10-CM

## 2013-02-20 DIAGNOSIS — Z7901 Long term (current) use of anticoagulants: Secondary | ICD-10-CM

## 2013-02-20 LAB — POCT INR: INR: 2.2

## 2013-03-20 ENCOUNTER — Ambulatory Visit (INDEPENDENT_AMBULATORY_CARE_PROVIDER_SITE_OTHER): Payer: Medicare Other | Admitting: *Deleted

## 2013-03-20 DIAGNOSIS — Z7901 Long term (current) use of anticoagulants: Secondary | ICD-10-CM

## 2013-03-20 DIAGNOSIS — I4891 Unspecified atrial fibrillation: Secondary | ICD-10-CM

## 2013-03-25 ENCOUNTER — Other Ambulatory Visit (HOSPITAL_COMMUNITY): Payer: Self-pay | Admitting: Internal Medicine

## 2013-03-25 ENCOUNTER — Ambulatory Visit (HOSPITAL_COMMUNITY)
Admission: RE | Admit: 2013-03-25 | Discharge: 2013-03-25 | Disposition: A | Discharge status: Home / Self Care | Payer: Medicare Other | Source: Ambulatory Visit | Attending: Internal Medicine | Admitting: Internal Medicine

## 2013-03-25 DIAGNOSIS — I10 Essential (primary) hypertension: Secondary | ICD-10-CM | POA: Insufficient documentation

## 2013-03-25 DIAGNOSIS — R05 Cough: Secondary | ICD-10-CM

## 2013-03-25 DIAGNOSIS — Z87891 Personal history of nicotine dependence: Secondary | ICD-10-CM | POA: Insufficient documentation

## 2013-03-25 DIAGNOSIS — R059 Cough, unspecified: Secondary | ICD-10-CM

## 2013-04-16 ENCOUNTER — Other Ambulatory Visit: Payer: Self-pay | Admitting: Cardiology

## 2013-05-01 ENCOUNTER — Ambulatory Visit (INDEPENDENT_AMBULATORY_CARE_PROVIDER_SITE_OTHER): Payer: Medicare Other | Admitting: *Deleted

## 2013-05-01 DIAGNOSIS — Z7901 Long term (current) use of anticoagulants: Secondary | ICD-10-CM

## 2013-05-01 DIAGNOSIS — I4891 Unspecified atrial fibrillation: Secondary | ICD-10-CM

## 2013-06-13 ENCOUNTER — Ambulatory Visit (INDEPENDENT_AMBULATORY_CARE_PROVIDER_SITE_OTHER): Payer: Medicare Other | Admitting: *Deleted

## 2013-06-13 DIAGNOSIS — I4891 Unspecified atrial fibrillation: Secondary | ICD-10-CM

## 2013-06-13 DIAGNOSIS — Z7901 Long term (current) use of anticoagulants: Secondary | ICD-10-CM

## 2013-07-25 ENCOUNTER — Ambulatory Visit (INDEPENDENT_AMBULATORY_CARE_PROVIDER_SITE_OTHER): Payer: Medicare Other | Admitting: *Deleted

## 2013-07-25 DIAGNOSIS — I4891 Unspecified atrial fibrillation: Secondary | ICD-10-CM

## 2013-07-25 DIAGNOSIS — Z7901 Long term (current) use of anticoagulants: Secondary | ICD-10-CM

## 2013-07-30 ENCOUNTER — Telehealth: Payer: Self-pay | Admitting: Cardiology

## 2013-07-30 NOTE — Telephone Encounter (Signed)
New Problem   Pt's wife called and states his coumadin has messed up// elbow protruding really bad// primary care states that he should have surgery// request to see a cardiologist before they agree to surgery for second opinion. Will try a NP or PA for ov.

## 2013-07-30 NOTE — Telephone Encounter (Signed)
Tried to find a NP or PA appt for pt/// no appt's available// please assist

## 2013-07-30 NOTE — Telephone Encounter (Signed)
Left message for pt on voicemail  Dr Antoine Poche doesn't have an appts until 09/2013  Dr Wyline Mood has appts in the [facility] office this week if he wants to be seen.

## 2013-08-01 ENCOUNTER — Ambulatory Visit (INDEPENDENT_AMBULATORY_CARE_PROVIDER_SITE_OTHER): Payer: Medicare Other | Admitting: *Deleted

## 2013-08-01 DIAGNOSIS — Z7901 Long term (current) use of anticoagulants: Secondary | ICD-10-CM

## 2013-08-01 DIAGNOSIS — I4891 Unspecified atrial fibrillation: Secondary | ICD-10-CM

## 2013-08-01 NOTE — Telephone Encounter (Signed)
NA

## 2013-08-01 NOTE — Telephone Encounter (Signed)
Spoke with Chad Hey, RN in the [name] office who just saw the pt.  The reports the hematoma on the patients elbow does appear to be getting smaller.  INR today was 2.2 and has been stable.  She advised pt it may take up to 3 to 6 months for this to completely resolve.  Will attempt to contact pt again this afternoon (once he has a chance to get home) to discuss scheduling with MD.  Chad [name] with pt's wife - who states pt wants to establish care with Dr Chad [name] since Dr Chad [name] retired.  She is aware Dr Chad [name] does not have an appt available until November.

## 2013-08-02 NOTE — Telephone Encounter (Signed)
Spoke with pt who states he feels as though the hematoma on his elbow is getting better.  He is agreeable to wait for his appt as scheduled.  He is aware that he can see an MD in the [facility] office if necessary before seeing Dr Antoine Poche.  He will call back if issues or concerns.

## 2013-08-12 ENCOUNTER — Ambulatory Visit: Payer: Medicare Other | Admitting: Cardiology

## 2013-09-12 ENCOUNTER — Ambulatory Visit (INDEPENDENT_AMBULATORY_CARE_PROVIDER_SITE_OTHER): Payer: Medicare Other | Admitting: *Deleted

## 2013-09-12 DIAGNOSIS — Z7901 Long term (current) use of anticoagulants: Secondary | ICD-10-CM

## 2013-09-12 DIAGNOSIS — I4891 Unspecified atrial fibrillation: Secondary | ICD-10-CM

## 2013-09-12 LAB — POCT INR: INR: 1.8

## 2013-09-24 ENCOUNTER — Ambulatory Visit (INDEPENDENT_AMBULATORY_CARE_PROVIDER_SITE_OTHER): Payer: Medicare Other | Admitting: Cardiology

## 2013-09-24 ENCOUNTER — Encounter: Payer: Self-pay | Admitting: Cardiology

## 2013-09-24 ENCOUNTER — Other Ambulatory Visit: Payer: Self-pay | Admitting: Cardiology

## 2013-09-24 VITALS — BP 178/80 | HR 73 | Ht 70.5 in | Wt 267.0 lb

## 2013-09-24 DIAGNOSIS — R29818 Other symptoms and signs involving the nervous system: Secondary | ICD-10-CM

## 2013-09-24 DIAGNOSIS — I4891 Unspecified atrial fibrillation: Secondary | ICD-10-CM

## 2013-09-24 DIAGNOSIS — R2689 Other abnormalities of gait and mobility: Secondary | ICD-10-CM

## 2013-09-24 DIAGNOSIS — I10 Essential (primary) hypertension: Secondary | ICD-10-CM

## 2013-09-24 NOTE — Patient Instructions (Addendum)
Your physician wants you to follow-up in: 1 YEAR WITH DR. HOCHREIN You will receive a reminder letter in the mail two months in advance. If you don't receive a letter, please call our office to schedule the follow-up appointment.  PHYSICIAN WOULD LIKE FOR YOU TO KEEP AN EYE ON YOUR BLOOD PRESSURE DUE TO IT BEING A LITTLE HIGH TODAY  You have been referred to NEUROLOGIST

## 2013-09-24 NOTE — Progress Notes (Signed)
HPI The patient presents for followup of atrial fibrillation. He does not think he's been in this rhythm in quite some time. He knows that he's had triggers in the past with cold foods or cold water. Caffeine will increase his heart rate. He has not felt any rapid heart rate. He's had no presyncope or syncope. He denies any chest pressure, neck or arm discomfort. He denies any shortness of breath, PND or orthopnea. He does have problems with his balance and progressive neuropathy. He has had no weight gain or edema. Unfortunately he does know he waits too much and has not been able to change this.  Allergies  Allergen Reactions  . Flagyl [Metronidazole] Nausea Only  . Lidocaine   . Penicillins   . Sulfonamide Derivatives   . Tetanus Toxoid     Current Outpatient Prescriptions  Medication Sig Dispense Refill  . amiodarone (PACERONE) 200 MG tablet TAKE 1 TABLET (200 MG TOTAL) BY MOUTH DAILY.  30 tablet  5  . Calcium-Magnesium 500-250 MG TABS Take 1 tablet by mouth daily.        . Cholecalciferol 1000 UNIT capsule Take 1,000 Units by mouth 2 (two) times daily.        . Coenzyme Q10 (EQL COQ10) 100 MG capsule Take 100 mg by mouth daily.        . Cyanocobalamin (VITAMIN B 12 PO) Take 100 mcg by mouth 2 (two) times daily.       Marland Kitchen escitalopram (LEXAPRO) 10 MG tablet Take 10 mg by mouth daily.        . irbesartan (AVAPRO) 150 MG tablet Take 1 tablet (150 mg total) by mouth 2 (two) times daily.  180 tablet  3  . Multiple Vitamins-Minerals (CENTRUM SILVER PO) Take 1 tablet by mouth daily.        Marland Kitchen warfarin (COUMADIN) 5 MG tablet As directed by Anticoagulation clinic.       No current facility-administered medications for this visit.    Past Medical History  Diagnosis Date  . Arrhythmia     atrial fib  . Hypertension   . Hypoglycemia     Past Surgical History  Procedure Laterality Date  . Lumbar disc surgery  1991    ROS:   As stated in the HPI and negative for all other  systems.  PHYSICAL EXAM BP 178/80  Pulse 73  Ht 5' 10.5" (1.791 m)  Wt 267 lb (121.11 kg)  BMI 37.76 kg/m2 GENERAL:  Well appearing HEENT:  Pupils equal round and reactive, fundi not visualized, oral mucosa unremarkable NECK:  No jugular venous distention, waveform within normal limits, carotid upstroke brisk and symmetric, no bruits, no thyromegaly LYMPHATICS:  No cervical, inguinal adenopathy LUNGS:  Clear to auscultation bilaterally BACK:  No CVA tenderness CHEST:  Unremarkable HEART:  PMI not displaced or sustained,S1 and S2 within normal limits, no S3, no S4, no clicks, no rubs, no murmurs ABD:  Flat, positive bowel sounds normal in frequency in pitch, no bruits, no rebound, no guarding, no midline pulsatile mass, no hepatomegaly, no splenomegaly EXT:  2 plus pulses throughout, no edema, no cyanosis no clubbing SKIN:  No rashes no nodules NEURO:  Cranial nerves II through XII grossly intact, motor grossly intact throughout PSYCH:  Cognitively intact, oriented to person place and time   EKG:  Sinus rhythm, rate 73, axis within normal limits, old inferior infarct, nonspecific diffuse T wave flattening.  09/24/2013   ASSESSMENT AND PLAN  ATRIAL FIBRILLATION:  He seems to be maintaining sinus rhythm. It is possible that a meal around could be contributing to his neuropathy. I would like for him to see a neurologist prior to considering stopping this medication. He's been quite symptomatic with fibrillation in the past and has done well on the amiodarone. He has no interest in switching off warfarin and he will stay on this.   Of note he had blood work done recently and I asked him to call his primary care office to make sure this was a thyroid study and liver enzymes.  HTN:  His blood pressure is not controlled today. We will keep a blood pressure diary. As previously okay when he was last here. Further adjustments will be based on his readings.  OVERWEIGHT:  We had a discussion  about weight loss.  NEUROPATHY:  He is having problems with balance and neuropathy as above and I will set him up to see a neurologist.

## 2013-09-25 ENCOUNTER — Ambulatory Visit (INDEPENDENT_AMBULATORY_CARE_PROVIDER_SITE_OTHER): Payer: Medicare Other | Admitting: Neurology

## 2013-09-25 ENCOUNTER — Encounter: Payer: Self-pay | Admitting: Neurology

## 2013-09-25 VITALS — BP 149/82 | HR 59 | Resp 18 | Ht 70.0 in | Wt 268.0 lb

## 2013-09-25 DIAGNOSIS — H9319 Tinnitus, unspecified ear: Secondary | ICD-10-CM

## 2013-09-25 DIAGNOSIS — R279 Unspecified lack of coordination: Secondary | ICD-10-CM

## 2013-09-25 DIAGNOSIS — H9313 Tinnitus, bilateral: Secondary | ICD-10-CM

## 2013-09-25 DIAGNOSIS — R278 Other lack of coordination: Secondary | ICD-10-CM

## 2013-09-25 DIAGNOSIS — H5509 Other forms of nystagmus: Secondary | ICD-10-CM

## 2013-09-25 DIAGNOSIS — G589 Mononeuropathy, unspecified: Secondary | ICD-10-CM

## 2013-09-25 DIAGNOSIS — G629 Polyneuropathy, unspecified: Secondary | ICD-10-CM | POA: Insufficient documentation

## 2013-09-25 DIAGNOSIS — G4710 Hypersomnia, unspecified: Secondary | ICD-10-CM | POA: Insufficient documentation

## 2013-09-25 NOTE — Progress Notes (Signed)
[facility]  Provider:  Melvyn Novas, M D  Referring Provider: Corrie Mckusick, MD Primary Care Physician:  Colette Ribas, MD  Chief Complaint  Patient presents with  . Hypertension    NP  . Atrial Fibrillation  . BALANCE ISSUES    HPI:  Chad [name] is a 79 y.o. male  Is seen here as a referral from Dr. Antoine Poche and  Phillips Odor for Balance problems,   [name] reports that he has developed gait instability and balance problems. He used the term ' I can't walk a straight line anymore" . Reports having fallen 3 times in the last 6 months, he needs to with his gaze to his feet to walk steady. In addition he reports that remotely perhaps 20 years ago he suffered for 4 continuous years from vertical and felt constantly in motion but actually not moving. This episode however concluded that the initiation of niacin. He had  worked  with radioisotopes, and  The exposure was considered another possible cause of his Vertigo. He does not consider his current episodes alike, and  neither does he have syncope or presyncope.  He is ataxic and neuropathic. He takes B 12 supplement.   The patient suffers for long time from intrafibrillation, and ablation was never attempted he reports and according to Dr. Jenene Slicker note ,he has been able to maintain sinus rhythm. Amiodarone causes neuropathies and he has been on this medication now for several years. Actually, decades.  Thyroid hormones and liver enzymes are followed by his primary care physician, Dr. Phillips Odor.      Review of Systems: Out of a complete 14 system review, the patient complains of only the following symptoms, and all other reviewed systems are negative.  Soreness over quadriceps and  Truncal core strength reduced. He has ataxia, and neuropathy.  High  fall risk while on coumadin!     History   Social History  . Marital Status: Married    Spouse Name: N/A    Number of Children: N/A  . Years of  Education: N/A   Occupational History  . Not on file.   Social History Main Topics  . Smoking status: Former Smoker    Types: Cigarettes    Quit date: 12/13/1970  . Smokeless tobacco: Not on file  . Alcohol Use: No  . Drug Use: No  . Sexual Activity:    Other Topics Concern  . Not on file   Social History Narrative  . No narrative on file    Family History  Problem Relation Age of Onset  . Heart failure Father     Past Medical History  Diagnosis Date  . Arrhythmia     atrial fib  . Hypertension   . Hypoglycemia     Past Surgical History  Procedure Laterality Date  . Lumbar disc surgery  1991    Current Outpatient Prescriptions  Medication Sig Dispense Refill  . amiodarone (PACERONE) 200 MG tablet TAKE 1 TABLET (200 MG TOTAL) BY MOUTH DAILY.  30 tablet  5  . Calcium-Magnesium 500-250 MG TABS Take 1 tablet by mouth daily.        . Cholecalciferol 1000 UNIT capsule Take 1,000 Units by mouth 2 (two) times daily.        . Coenzyme Q10 (EQL COQ10) 100 MG capsule Take 100 mg by mouth daily.        . Cyanocobalamin (VITAMIN B 12 PO) Take 100 mcg by mouth 2 (two) times daily.       Marland Kitchen  escitalopram (LEXAPRO) 10 MG tablet Take 10 mg by mouth daily.        . irbesartan (AVAPRO) 150 MG tablet Take 1 tablet (150 mg total) by mouth 2 (two) times daily.  180 tablet  3  . Multiple Vitamins-Minerals (CENTRUM SILVER PO) Take 1 tablet by mouth daily.        Marland Kitchen warfarin (COUMADIN) 5 MG tablet As directed by Anticoagulation clinic. Pt is taking 2.5 QD except on Thurs. Pt taker 5 mg on this day only       No current facility-administered medications for this visit.    Allergies as of 09/25/2013 - Review Complete 09/25/2013  Allergen Reaction Noted  . Flagyl [metronidazole] Nausea Only 07/29/2011  . Lidocaine    . Penicillins  07/02/2009  . Sulfonamide derivatives  01/12/2011  . Tetanus toxoid      Vitals: BP 149/82  Pulse 59  Resp 18  Ht 5\' 10"  (1.778 m)  Wt 268 lb (121.564  kg)  BMI 38.45 kg/m2 Last Weight:  Wt Readings from Last 1 Encounters:  09/25/13 268 lb (121.564 kg)   Last Height:   Ht Readings from Last 1 Encounters:  09/25/13 5\' 10"  (1.778 m)    Physical exam:  General: The patient is awake, alert and appears not in acute distress. The patient is well groomed. Head: Normocephalic, atraumatic. Neck is supple. Mallampati1, neck circumference:14.5 Cardiovascular:  Regular rate and rhythm , without  murmurs or carotid bruit, and without distended neck veins. Respiratory: Lungs are clear to auscultation. Skin:  Without evidence of edema, or rash Trunk: BMI is elevated .  Neurologic exam : The patient is awake and alert, oriented to place and time.  Memory subjective  described as intact. There is a normal attention span & concentration ability.  Speech is fluent without  dysarthria, dysphonia or aphasia. Mood and affect are appropriate.  Cranial nerves: Pupils are equal and briskly reactive to light.  Funduscopic exam , darkened areas , the  blood vessels appear dark- his iris has a cross like darker pigmentation, no arcus -neither  pallor or edema. Extraocular movements  in vertical planes intact,  and a strong horizontal nystagmus.   Visual fields by finger perimetry are intact. Hearing to finger rub, bone and air conduction by tuning fork symmetric and  intact.   Facial sensation intact to fine touch. Facial motor strength is symmetric and tongue and uvula move midline.  Motor exam:   Normal tone and normal muscle bulk and symmetric normal strength in all extremities.  Sensory:  Fine touch, pinprick and vibration were tested in all extremities. He has lost vibration and filament touch completely over both feet, and above the ankle line. Mid knee high.   Proprioception is tested in the upper extremities only. This was  normal.  Coordination: Rapid alternating movements in the fingers/hands is tested and normal.  Finger-to-nose maneuver tested  and normal without evidence of ataxia, dysmetria or tremor. Tandem gait is severely impaired and he has trouble standing  If not wide based. Romberg positive,   Gait and station: Patient walks without assistive device and was able to rise form a seated position without bracing himself.  Deep tendon reflexes:  Absent achilles tendon reflex ,  Babinski maneuver response is absent .patella is 1 plus.    Assessment:  After physical and neurologic examination, review of laboratory studies, imaging, neurophysiology testing and pre-existing records, assessment is  Peripheral sensory neuropathy ,  Gait ataxia and nystagmus.  He  has tinnitus, bilaterally, neck pain with radiation to the  occiput .    Plan:  Treatment plan and additional workup : 1)amiodarone induce neuropathy. The best approach would be to replace amiodarone, if possible form a cardiological standpoint. PT for gait stabilization . Fall prevention.  I would like for him to take a  Vitamin B complex and to undergo a sleep study for his reported sleep snoring and witnessed apnea.

## 2013-09-25 NOTE — Patient Instructions (Signed)
Ataxia You have an unsteady walk called ataxia. Your condition may require further tests. Ataxia can be caused by:  Neurological conditions.  Infections.  Physical exhaustion.  Internal bleeding.  Alcohol intoxication, or medication side effects.  Problems with circulation, blood pressure, and heart disease can also make you unsteady. Laboratory and X-ray tests may be needed.  Treatment for now:  Get plenty of rest and eat a nutritious diet over the next weeks.  Avoid alcohol.  If you become very unsteady, dizzy, nauseated, or feel like you are going to faint, lie down flat right away.  Wait until all your symptoms pass before you get up again. SEEK IMMEDIATE MEDICAL CARE IF:  You develop severe unsteadiness, headache, chest pain, or abdominal pain.  You have weakness or numbness on one side of your body.  You have problems with your vision.  You develop confusion or difficulty speaking.  You have a fever, chills, or an irregular heartbeat or a very fast pulse. MAKE SURE YOU:   Understand these instructions.  Will watch your condition.  Will get help right away if you are not doing well or get worse. Document Released: 10/31/2005 Document Revised: 01/23/2012 Document Reviewed: 04/19/2007 [facility] Patient Information 2014 [place], [place]. Atrial Fibrillation Atrial fibrillation is a type of irregular heart rhythm (arrhythmia). During atrial fibrillation, the upper chambers of the heart (atria) quiver continuously in a chaotic pattern. This causes an irregular and often rapid heart rate.  Atrial fibrillation is the result of the heart becoming overloaded with disorganized signals that tell it to beat. These signals are normally released one at a time by a part of the right atrium called the sinoatrial node. They then travel from the atria to the lower chambers of the heart (ventricles), causing the atria and ventricles to contract and pump blood as they pass. In atrial  fibrillation, parts of the atria outside of the sinoatrial node also release these signals. This results in two problems. First, the atria receive so many signals that they do not have time to fully contract. Second, the ventricles, which can only receive one signal at a time, beat irregularly and out of rhythm with the atria.  There are three types of atrial fibrillation:   Paroxysmal Paroxysmal atrial fibrillation starts suddenly and stops on its own within a week.   Persistent Persistent atrial fibrillation lasts for more than a week. It may stop on its own or with treatment.   Permanent Permanent atrial fibrillation does not go away. Episodes of atrial fibrillation may lead to permanent atrial fibrillation.  Atrial fibrillation can prevent your heart from pumping blood normally. It increases your risk of stroke and can lead to heart failure.  CAUSES   Heart conditions, including a heart attack, heart failure, coronary artery disease, and heart valve conditions.   Inflammation of the sac that surrounds the heart (pericarditis).   Blockage of an artery in the lungs (pulmonary embolism).   Pneumonia or other infections.   Chronic lung disease.   Thyroid problems, especially if the thyroid is overactive (hyperthyroidism).   Caffeine, excessive alcohol use, and use of some illegal drugs.   Use of some medications, including certain decongestants and diet pills.   Heart surgery.   Birth defects.  Sometimes, no cause can be found. When this happens, the atrial fibrillation is called lone atrial fibrillation. The risk of complications from atrial fibrillation increases if you have lone atrial fibrillation and you are age 48 years or older. RISK FACTORS  Heart failure.  Coronary artery disease  Diabetes mellitus.   High blood pressure (hypertension).   Obesity.   Other arrhythmias.   Increased age. SYMPTOMS   A feeling that your heart is beating rapidly or  irregularly.   A feeling of discomfort or pain in your chest.   Shortness of breath.   Sudden lightheadedness or weakness.   Getting tired easily when exercising.   Urinating more often than normal (mainly when atrial fibrillation first begins).  In paroxysmal atrial fibrillation, symptoms may start and suddenly stop. DIAGNOSIS  Your caregiver may be able to detect atrial fibrillation when taking your pulse. Usually, testing is needed to diagnosis atrial fibrillation. Tests may include:   Electrocardiography. During this test, the electrical impulses of your heart are recorded while you are lying down.   Echocardiography. During echocardiography, sound waves are used to evaluate how blood flows through your heart.   Stress test. There is more than one type of stress test. If a stress test is needed, ask your caregiver about which type is best for you.   Chest X-ray exam.   Blood tests.   Computed tomography (CT).  TREATMENT   Treating any underlying conditions. For example, if you have an overactive thyroid, treating the condition may correct atrial fibrillation.   Medication. Medications may be given to control a rapid heart rate or to prevent blood clots, heart failure, or a stroke.   Procedure to correct the rhythm of the heart:  Electrical cardioversion. During electrical cardioversion, a controlled, low-energy shock is delivered to the heart through your skin. If you have chest pain, very low pressure blood pressure, or sudden heart failure, this procedure may need to be done as an emergency.  Catheter ablation. During this procedure, heart tissues that send the signals that cause atrial fibrillation are destroyed.  Maze or minimaze procedure. During this surgery, thin lines of heart tissue that carry the abnormal signals are destroyed. The maze procedure is an open-heart surgery. The minimaze procedure is a minimally invasive surgery. This means that small  cuts are made to access the heart instead of a large opening.  Pulmonary venous isolation. During this surgery, tissue around the veins that carry blood from the lungs (pulmonary veins) is destroyed. This tissue is thought to carry the abnormal signals. HOME CARE INSTRUCTIONS   Take medications as directed by your caregiver.  Only take medications that your caregiver approves. Some medications can make atrial fibrillation worse or recur.  If blood thinners were prescribed by your caregiver, take them exactly as directed. Too much can cause bleeding. Too little and you will not have the needed protection against stroke and other problems.  Perform blood tests at home if directed by your caregiver.  Perform blood tests exactly as directed.   Quit smoking if you smoke.   Do not drink alcohol.   Do not drink caffeinated beverages such as coffee, soda, and some teas. You may drink decaffeinated coffee, soda, or tea.   Maintain a healthy weight. Do not use diet pills unless your caregiver approves. They may make heart problems worse.   Follow diet instructions as directed by your caregiver.   Exercise regularly as directed by your caregiver.   Keep all follow-up appointments. PREVENTION  The following substances can cause atrial fibrillation to recur:   Caffeinated beverages.   Alcohol.   Certain medications, especially those used for breathing problems.   Certain herbs and herbal medications, such as those containing ephedra or ginseng.  Illegal drugs such as cocaine and amphetamines. Sometimes medications are given to prevent atrial fibrillation from recurring. Proper treatment of any underlying condition is also important in helping prevent recurrence.  SEEK MEDICAL CARE IF:  You notice a change in the rate, rhythm, or strength of your heartbeat.   You suddenly begin urinating more frequently.   You tire more easily when exerting yourself or exercising.   SEEK IMMEDIATE MEDICAL CARE IF:   You develop chest pain, abdominal pain, sweating, or weakness.  You feel sick to your stomach (nauseous).  You develop shortness of breath.  You suddenly develop swollen feet and ankles.  You feel dizzy.  You face or limbs feel numb or weak.  There is a change in your vision or speech. MAKE SURE YOU:   Understand these instructions.  Will watch your condition.  Will get help right away if you are not doing well or get worse. Document Released: 10/31/2005 Document Revised: 02/25/2013 Document Reviewed: 12/11/2012 [facility] Patient Information 2014 [place], [place]. Amiodarone tablets What is this medicine? AMIODARONE (a MEE oh da rone) is an antiarrhythmic drug. It helps make your heart beat regularly. Because of the side effects caused by this medicine, it is only used when other medicines have not worked. It is usually used for heartbeat problems that may be life threatening. This medicine may be used for other purposes; ask your health care provider or pharmacist if you have questions. COMMON BRAND NAME(S): Cordarone, Pacerone What should I tell my health care provider before I take this medicine? They need to know if you have any of these conditions: -liver disease -lung disease -other heart problems -thyroid disease -an unusual or allergic reaction to amiodarone, iodine, other medicines, foods, dyes, or preservatives -pregnant or trying to get pregnant -breast-feeding How should I use this medicine? Take this medicine by mouth with a glass of water. Follow the directions on the prescription label. You can take this medicine with or without food. However, you should always take it the same way each time. Take your doses at regular intervals. Do not take your medicine more often than directed. Do not stop taking except on the advice of your doctor or health care professional. A special MedGuide will be given to you by the pharmacist with  each prescription and refill. Be sure to read this information carefully each time. Talk to your pediatrician regarding the use of this medicine in children. Special care may be needed. Overdosage: If you think you have taken too much of this medicine contact a poison control center or emergency room at once. NOTE: This medicine is only for you. Do not share this medicine with others. What if I miss a dose? If you miss a dose, take it as soon as you can. If it is almost time for your next dose, take only that dose. Do not take double or extra doses. What may interact with this medicine? Do not take this medicine with any of the following medications: -abarelix -amoxapine -apomorphine -arsenic trioxide -certain macrolide antibiotics -certain quinolone antibiotics -cisapride -droperidol -haloperidol -hawthorn -levomethadyl -maprotiline -medicines for malaria like chloroquine and halofantrine -medicines for mental depression such as tricyclic antidepressants -medicines to control heart rhythm like disopyramide, dofetilide, ibutilide, propafenone, and sotalol -methadone -mibefradil -pentamidine -phenothiazines like chlorpromazine, mesoridazine, and thioridazine -pimozide -probucol -ranolazine -sertindole -vardenafil -red yeast rice -ziprasidone This medicine may also interact with the following medications: -beta blockers -calcium channel blockers -cholestyramine -cimetidine -clopidogrel -cyclosporine -dextromethorphan -digoxin -diuretics -fentanyl -flecainide -fluindione -general  anesthetics -grapefruit juice -lidocaine -loratadine -medicines for fungal infections like ketoconazole, fluconazole, and itraconazole -medicines for HIV, AIDS -medicines for seizures such as phenytoin -medicines for thyroid problems -medicines to lower cholesterol such as atorvastatin, cerivastatin, lovastatin, or simvastatin -methotrexate -procainamide -quinidine -rifampin,  rifabutin, or rifapentine -St. John's Wort -trazodone -warfarin This list may not describe all possible interactions. Give your health care provider a list of all the medicines, herbs, non-prescription drugs, or dietary supplements you use. Also tell them if you smoke, drink alcohol, or use illegal drugs. Some items may interact with your medicine. What should I watch for while using this medicine? Your condition will be monitored closely when you first begin therapy. Often, this drug is first started in a hospital or other monitored health care setting. Once you are on maintenance therapy, visit your doctor or health care professional for regular checks on your progress. Because your condition and use of this medicine carry some risk, it is a good idea to carry an identification card, necklace or bracelet with details of your condition, medications, and doctor or health care professional. Bonita Quin may get drowsy or dizzy. Do not drive, use machinery, or do anything that needs mental alertness until you know how this medicine affects you. Do not stand or sit up quickly, especially if you are an older patient. This reduces the risk of dizzy or fainting spells. This medicine can make you more sensitive to the sun. Keep out of the sun. If you cannot avoid being in the sun, wear protective clothing and use sunscreen. Do not use sun lamps or tanning beds/booths. You should have regular eye exams before and during treatment. Call your doctor if you have blurred vision, see halos, or your eyes become sensitive to light. Your eyes may get dry. It may be helpful to use a lubricating eye solution or artificial tears solution. If you are going to have surgery or a procedure that requires contrast dyes, tell your doctor or health care professional that you are taking this medicine. What side effects may I notice from receiving this medicine? Side effects that you should report to your doctor or health care professional  as soon as possible: -allergic reactions like skin rash, itching or hives, swelling of the face, lips, or tongue -blue-gray coloring of the skin -blurred vision, seeing blue green halos, increased sensitivity of the eyes to light -breathing problems -chest pain -dark urine -fast, irregular heartbeat -feeling faint or light-headed -intolerance to heat or cold -nausea or vomiting -pain and swelling of the scrotum -pain, tingling, numbness in feet, hands -redness, blistering, peeling or loosening of the skin, including inside the mouth -spitting up blood -stomach pain -sweating -unusual or uncontrolled movements of body -unusually weak or tired -weight gain or loss -yellowing of the eyes or skin Side effects that usually do not require medical attention (report to your doctor or health care professional if they continue or are bothersome): -change in sex drive or performance -constipation -dizziness -headache -loss of appetite -trouble sleeping This list may not describe all possible side effects. Call your doctor for medical advice about side effects. You may report side effects to FDA at 1-800-FDA-1088. Where should I keep my medicine? Keep out of the reach of children. Store at room temperature between 20 and 25 degrees C (68 and 77 degrees F). Protect from light. Keep container tightly closed. Throw away any unused medicine after the expiration date. NOTE: This sheet is a summary. It may not cover all possible information.  If you have questions about this medicine, talk to your doctor, pharmacist, or health care provider.  2014, [employer]. (2009-05-29 14:08:28)

## 2013-09-27 ENCOUNTER — Ambulatory Visit: Payer: Medicare Other | Attending: Neurology | Admitting: Physical Therapy

## 2013-09-27 DIAGNOSIS — R262 Difficulty in walking, not elsewhere classified: Secondary | ICD-10-CM | POA: Diagnosis not present

## 2013-09-27 DIAGNOSIS — R269 Unspecified abnormalities of gait and mobility: Secondary | ICD-10-CM | POA: Diagnosis not present

## 2013-09-27 DIAGNOSIS — R279 Unspecified lack of coordination: Secondary | ICD-10-CM | POA: Diagnosis not present

## 2013-09-27 DIAGNOSIS — IMO0001 Reserved for inherently not codable concepts without codable children: Secondary | ICD-10-CM | POA: Insufficient documentation

## 2013-10-01 ENCOUNTER — Telehealth: Payer: Self-pay | Admitting: *Deleted

## 2013-10-01 ENCOUNTER — Ambulatory Visit: Payer: Medicare Other | Admitting: Physical Therapy

## 2013-10-01 DIAGNOSIS — IMO0001 Reserved for inherently not codable concepts without codable children: Secondary | ICD-10-CM | POA: Diagnosis not present

## 2013-10-01 NOTE — Telephone Encounter (Signed)
Pt is aware to stop amiodarone.  He is scheduled for f/u with Dr Antoine Poche 12/18 at 9 am.   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 I would like to see him back in about one month. Per Dr Antoine Poche   ----- Message ----- From: Melvyn Novas, MD Sent: 09/25/2013 10:09 AM To: Rollene Rotunda, MD

## 2013-10-03 ENCOUNTER — Ambulatory Visit (INDEPENDENT_AMBULATORY_CARE_PROVIDER_SITE_OTHER): Payer: Medicare Other | Admitting: *Deleted

## 2013-10-03 DIAGNOSIS — I4891 Unspecified atrial fibrillation: Secondary | ICD-10-CM

## 2013-10-03 DIAGNOSIS — Z7901 Long term (current) use of anticoagulants: Secondary | ICD-10-CM

## 2013-10-04 ENCOUNTER — Ambulatory Visit: Payer: Medicare Other | Admitting: Physical Therapy

## 2013-10-04 DIAGNOSIS — IMO0001 Reserved for inherently not codable concepts without codable children: Secondary | ICD-10-CM | POA: Diagnosis not present

## 2013-10-07 ENCOUNTER — Ambulatory Visit: Payer: Medicare Other | Admitting: Physical Therapy

## 2013-10-07 DIAGNOSIS — IMO0001 Reserved for inherently not codable concepts without codable children: Secondary | ICD-10-CM | POA: Diagnosis not present

## 2013-10-08 ENCOUNTER — Ambulatory Visit: Payer: Medicare Other | Admitting: Physical Therapy

## 2013-10-08 DIAGNOSIS — IMO0001 Reserved for inherently not codable concepts without codable children: Secondary | ICD-10-CM | POA: Diagnosis not present

## 2013-10-16 ENCOUNTER — Ambulatory Visit: Payer: Medicare Other | Attending: Neurology | Admitting: Physical Therapy

## 2013-10-16 DIAGNOSIS — R279 Unspecified lack of coordination: Secondary | ICD-10-CM | POA: Insufficient documentation

## 2013-10-16 DIAGNOSIS — R269 Unspecified abnormalities of gait and mobility: Secondary | ICD-10-CM | POA: Insufficient documentation

## 2013-10-16 DIAGNOSIS — R262 Difficulty in walking, not elsewhere classified: Secondary | ICD-10-CM | POA: Insufficient documentation

## 2013-10-16 DIAGNOSIS — IMO0001 Reserved for inherently not codable concepts without codable children: Secondary | ICD-10-CM | POA: Insufficient documentation

## 2013-10-18 ENCOUNTER — Ambulatory Visit: Payer: Medicare Other | Admitting: Physical Therapy

## 2013-10-21 ENCOUNTER — Other Ambulatory Visit: Payer: Self-pay | Admitting: Cardiology

## 2013-10-22 ENCOUNTER — Ambulatory Visit (INDEPENDENT_AMBULATORY_CARE_PROVIDER_SITE_OTHER): Payer: Medicare Other

## 2013-10-22 ENCOUNTER — Other Ambulatory Visit: Payer: Self-pay | Admitting: Cardiology

## 2013-10-22 DIAGNOSIS — H9313 Tinnitus, bilateral: Secondary | ICD-10-CM

## 2013-10-22 DIAGNOSIS — G629 Polyneuropathy, unspecified: Secondary | ICD-10-CM

## 2013-10-22 DIAGNOSIS — G4733 Obstructive sleep apnea (adult) (pediatric): Secondary | ICD-10-CM

## 2013-10-22 DIAGNOSIS — G4710 Hypersomnia, unspecified: Secondary | ICD-10-CM

## 2013-10-22 DIAGNOSIS — R278 Other lack of coordination: Secondary | ICD-10-CM

## 2013-10-23 ENCOUNTER — Ambulatory Visit: Payer: Medicare Other | Admitting: Physical Therapy

## 2013-10-25 ENCOUNTER — Ambulatory Visit: Payer: Medicare Other | Admitting: Physical Therapy

## 2013-10-31 ENCOUNTER — Ambulatory Visit: Payer: Medicare Other | Admitting: Cardiology

## 2013-11-05 ENCOUNTER — Telehealth: Payer: Self-pay | Admitting: Neurology

## 2013-11-05 ENCOUNTER — Encounter: Payer: Self-pay | Admitting: *Deleted

## 2013-11-05 DIAGNOSIS — G4733 Obstructive sleep apnea (adult) (pediatric): Secondary | ICD-10-CM

## 2013-11-05 NOTE — Telephone Encounter (Signed)
I called and left a message for the patient that his recent sleep study revealed severe obstructice sleep apnea with moderate oxygen desaturation. I also informed the patient that Dr. Vickey Huger recommends CPAP therapy at home and that I will send his order to a [employer] company and they'll contact him once they contact [employer] to get the benefits for the CPAP machine. I will also fax a copy to Dr. Lamar Blinks office and mail a copy with a follow up letter to his address.

## 2013-11-13 ENCOUNTER — Ambulatory Visit (INDEPENDENT_AMBULATORY_CARE_PROVIDER_SITE_OTHER): Payer: Medicare Other | Admitting: *Deleted

## 2013-11-13 DIAGNOSIS — I4891 Unspecified atrial fibrillation: Secondary | ICD-10-CM

## 2013-11-13 DIAGNOSIS — Z7901 Long term (current) use of anticoagulants: Secondary | ICD-10-CM

## 2013-11-13 LAB — POCT INR: INR: 4.1

## 2013-11-15 ENCOUNTER — Encounter: Payer: Self-pay | Admitting: Cardiology

## 2013-11-15 ENCOUNTER — Ambulatory Visit (INDEPENDENT_AMBULATORY_CARE_PROVIDER_SITE_OTHER): Payer: Medicare Other | Admitting: Cardiology

## 2013-11-15 VITALS — BP 140/68 | HR 72 | Ht 70.5 in | Wt 265.8 lb

## 2013-11-15 DIAGNOSIS — I4891 Unspecified atrial fibrillation: Secondary | ICD-10-CM

## 2013-11-15 DIAGNOSIS — I10 Essential (primary) hypertension: Secondary | ICD-10-CM

## 2013-11-15 MED ORDER — DILTIAZEM HCL ER 120 MG PO CP12: 120.0000 mg | ORAL_CAPSULE | Freq: Two times a day (BID) | ORAL | Status: DC

## 2013-11-15 NOTE — Progress Notes (Signed)
HPI The patient presents for followup of atrial fibrillation. 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 This was in November and this was felt to be the cause. We called this patient and instructed him to stop the amiodarone. However, although this was documented the patient says he did not get his message. He has continued the amiodarone. He says he feels some occasional paroxysms of his heart beating out of rhythm. However, he is not having any chest pressure, neck or arm discomfort. He's not having any presyncope or syncope. He has no new shortness of breath, PND or orthopnea. He has no weight gain or edema. He has injured his hip a little bit and is walking with a cane today.  Allergies  Allergen Reactions  . Carbapenems   . Cephalosporins   . Ciprofloxacin   . Decongestant [Oxymetazoline]   . Flagyl [Metronidazole] Nausea Only  . Hctz [Hydrochlorothiazide]   . Levaquin [Levofloxacin In D5w]   . Lidocaine   . Penicillins   . Sulfonamide Derivatives   . Teline [Tetracycline]   . Tetanus Toxoid   . Z-Pak [Azithromycin]     Current Outpatient Prescriptions  Medication Sig Dispense Refill  . amiodarone (PACERONE) 200 MG tablet TAKE 1 TABLET (200 MG TOTAL) BY MOUTH DAILY.  30 tablet  5  . Calcium-Magnesium 500-250 MG TABS Take 1 tablet by mouth daily.        . Cholecalciferol 1000 UNIT capsule Take 1,000 Units by mouth 2 (two) times daily.        . Coenzyme Q10 (EQL COQ10) 100 MG capsule Take 100 mg by mouth daily.        . Cyanocobalamin (VITAMIN B 12 PO) Take 100 mcg by mouth 2 (two) times daily.       Marland Kitchen escitalopram (LEXAPRO) 10 MG tablet Take 10 mg by mouth daily.        . irbesartan (AVAPRO) 150 MG tablet Take 1 tablet (150 mg total) by mouth 2 (two) times daily.  180 tablet  3  . Multiple Vitamins-Minerals (CENTRUM SILVER PO) Take 1 tablet by mouth daily.        Marland Kitchen warfarin  (COUMADIN) 5 MG tablet As directed by Anticoagulation clinic. Pt is taking 2.5 QD except on Thurs. Pt taker 5 mg on this day only       No current facility-administered medications for this visit.    Past Medical History  Diagnosis Date  . Arrhythmia     atrial fib  . Hypertension   . Hypoglycemia   . 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 Tinnitus of both ears 09/25/2013  . Nystagmus, end-position 09/25/2013    Past Surgical History  Procedure Laterality Date  . Lumbar disc surgery  1991    ROS:   As stated in the HPI and negative for all other systems.  PHYSICAL EXAM BP 140/68  Pulse 72  Ht 5' 10.5" (1.791 m)  Wt 265 lb 12.8 oz (120.566 kg)  BMI 37.59 kg/m2 GENERAL:  Well appearing HEENT:  Pupils equal round and reactive, fundi not visualized, oral mucosa unremarkable NECK:  No jugular venous distention, waveform within normal limits, carotid upstroke brisk and symmetric, no bruits, no thyromegaly LYMPHATICS:  No cervical, inguinal adenopathy LUNGS:  Clear to auscultation bilaterally BACK:  No CVA tenderness CHEST:  Unremarkable HEART:  PMI not displaced or sustained,S1 and S2 within normal limits, no  S3, no S4, no clicks, no rubs, no murmurs ABD:  Flat, positive bowel sounds normal in frequency in pitch, no bruits, no rebound, no guarding, no midline pulsatile mass, no hepatomegaly, no splenomegaly EXT:  2 plus pulses throughout, no edema, no cyanosis no clubbing SKIN:  No rashes no nodules NEURO:  Cranial nerves II through XII grossly intact, motor grossly intact throughout PSYCH:  Cognitively intact, oriented to person place and time   EKG:  Sinus rhythm, rate 61, axis within normal limits, old inferior infarct, nonspecific diffuse T wave flattening.  11/15/2013   ASSESSMENT AND PLAN  ATRIAL FIBRILLATION:  He has again been instructed to stop the amiodarone. When he goes back into fibrillation he understands he needs to present to the hospital for management as we would be  unlikely to be able to manage this as an outpatient. I would most likely admit him and treat him with Tikosyn at that point.  I will order an echo.  I will start low dose Cardizem for rate control as he comes off of the amiodarone.    HTN:  The blood pressure is at target. No change in medications is indicated. We will continue with therapeutic lifestyle changes (TLC).  OVERWEIGHT:  The patient understands the need to lose weight with diet and exercise. We have discussed specific strategies for this.

## 2013-11-15 NOTE — Patient Instructions (Signed)
Your physician recommends that you schedule a follow-up appointment in: [place] has recommended you make the following change in your medication: IN  1 MONTH  START    CARDIZEM  120 MG  EVERY DAY STOP AMIODARONE  Your physician has requested that you have an echocardiogram. Echocardiography is a painless test that uses sound waves to create images of your heart. It provides your doctor with information about the size and shape of your heart and how well your heart's chambers and valves are working. This procedure takes approximately one hour. There are no restrictions for this procedure.

## 2013-11-20 ENCOUNTER — Other Ambulatory Visit: Payer: Self-pay | Admitting: Dermatology

## 2013-11-29 ENCOUNTER — Encounter: Payer: Self-pay | Admitting: Cardiovascular Disease

## 2013-11-29 ENCOUNTER — Ambulatory Visit (HOSPITAL_COMMUNITY): Payer: Medicare Other | Attending: Cardiology | Admitting: Cardiology

## 2013-11-29 DIAGNOSIS — I059 Rheumatic mitral valve disease, unspecified: Secondary | ICD-10-CM | POA: Insufficient documentation

## 2013-11-29 DIAGNOSIS — I4891 Unspecified atrial fibrillation: Secondary | ICD-10-CM

## 2013-11-29 DIAGNOSIS — I10 Essential (primary) hypertension: Secondary | ICD-10-CM | POA: Insufficient documentation

## 2013-11-29 NOTE — Progress Notes (Signed)
Echo performed. 

## 2013-12-04 ENCOUNTER — Ambulatory Visit (INDEPENDENT_AMBULATORY_CARE_PROVIDER_SITE_OTHER): Payer: Medicare Other | Admitting: *Deleted

## 2013-12-04 DIAGNOSIS — Z7901 Long term (current) use of anticoagulants: Secondary | ICD-10-CM

## 2013-12-04 DIAGNOSIS — I4891 Unspecified atrial fibrillation: Secondary | ICD-10-CM

## 2013-12-04 LAB — POCT INR: INR: 3.7

## 2013-12-10 ENCOUNTER — Telehealth: Payer: Self-pay | Admitting: Cardiology

## 2013-12-10 MED ORDER — IRBESARTAN 300 MG PO TABS: 300.0000 mg | ORAL_TABLET | Freq: Every day | ORAL | Status: DC

## 2013-12-10 NOTE — Telephone Encounter (Signed)
New message  Pt returned call//SR

## 2013-12-10 NOTE — Telephone Encounter (Signed)
OK to change to Irbesartan 300 mg po daily.

## 2013-12-10 NOTE — Telephone Encounter (Signed)
Pt aware RX will be changed to 300 mg once a day.  Rx sent to [facility] at pt's request.

## 2013-12-10 NOTE — Telephone Encounter (Signed)
Per pt - insurance will not cover Irbesartan 150 mg BID (#60)- will only cover #30.  Can he take 300 mg QD.

## 2013-12-17 ENCOUNTER — Other Ambulatory Visit: Payer: Self-pay | Admitting: Cardiology

## 2013-12-25 ENCOUNTER — Ambulatory Visit (INDEPENDENT_AMBULATORY_CARE_PROVIDER_SITE_OTHER): Payer: Medicare Other | Admitting: *Deleted

## 2013-12-25 DIAGNOSIS — Z5181 Encounter for therapeutic drug level monitoring: Secondary | ICD-10-CM

## 2013-12-25 DIAGNOSIS — Z7901 Long term (current) use of anticoagulants: Secondary | ICD-10-CM

## 2013-12-25 DIAGNOSIS — I4891 Unspecified atrial fibrillation: Secondary | ICD-10-CM

## 2013-12-25 LAB — POCT INR: INR: 2.5

## 2014-01-02 ENCOUNTER — Other Ambulatory Visit: Payer: Self-pay | Admitting: Dermatology

## 2014-01-13 ENCOUNTER — Encounter: Payer: Self-pay | Admitting: Cardiology

## 2014-01-13 ENCOUNTER — Ambulatory Visit (INDEPENDENT_AMBULATORY_CARE_PROVIDER_SITE_OTHER): Payer: Medicare Other | Admitting: Cardiology

## 2014-01-13 VITALS — BP 134/68 | HR 66 | Ht 70.5 in | Wt 265.0 lb

## 2014-01-13 DIAGNOSIS — I4891 Unspecified atrial fibrillation: Secondary | ICD-10-CM

## 2014-01-13 NOTE — Progress Notes (Signed)
HPI The patient presents for followup of atrial fibrillation. He has been taken off of his amiodarone recently because of a neuropathy possibly related.  However, his neuropathy is not better.  He feels the tingling in his feet more frequently.  He has had some palpitations.  However, these are short lived and not particularly symptomatic.  He had an echo which showed LAE but was otherwise unremarkable.  He has been diagnosed with sleep apnea and is being treated.    Allergies  Allergen Reactions  . Carbapenems   . Cephalosporins   . Ciprofloxacin   . Decongestant [Oxymetazoline]   . Flagyl [Metronidazole] Nausea Only  . Hctz [Hydrochlorothiazide]   . Levaquin [Levofloxacin In D5w]   . Lidocaine   . Penicillins   . Sulfonamide Derivatives   . Teline [Tetracycline]   . Tetanus Toxoid   . Z-Pak [Azithromycin]     Current Outpatient Prescriptions  Medication Sig Dispense Refill  . Calcium-Magnesium 500-250 MG TABS Take 1 tablet by mouth daily.        . Cholecalciferol 1000 UNIT capsule Take 1,000 Units by mouth 2 (two) times daily.        . Coenzyme Q10 (EQL COQ10) 100 MG capsule Take 100 mg by mouth daily.        . Cyanocobalamin (VITAMIN B 12 PO) Take 100 mcg by mouth 2 (two) times daily.       Marland Kitchen diltiazem (CARDIZEM SR) 120 MG 12 hr capsule Take 1 capsule (120 mg total) by mouth 2 (two) times daily.  30 capsule  6  . escitalopram (LEXAPRO) 10 MG tablet Take 10 mg by mouth daily.        . irbesartan (AVAPRO) 300 MG tablet Take 1 tablet (300 mg total) by mouth daily.  90 tablet  3  . Multiple Vitamins-Minerals (CENTRUM SILVER PO) Take 1 tablet by mouth daily.        Marland Kitchen warfarin (COUMADIN) 5 MG tablet As directed by Anticoagulation clinic. Pt is taking 2.5 QD except on Thurs. Pt taker 5 mg on this day only       No current facility-administered medications for this visit.    Past Medical History  Diagnosis Date  . Arrhythmia     atrial fib  . Hypertension   . Hypoglycemia     . 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 Tinnitus of both ears 09/25/2013  . Nystagmus, end-position 09/25/2013    Past Surgical History  Procedure Laterality Date  . Lumbar disc surgery  1991    ROS:   As stated in the HPI and negative for all other systems.  PHYSICAL EXAM BP 134/68  Pulse 66  Ht 5' 10.5" (1.791 m)  Wt 265 lb (120.203 kg)  BMI 37.47 kg/m2 GENERAL:  Well appearing NECK:  No jugular venous distention, waveform within normal limits, carotid upstroke brisk and symmetric, no bruits, no thyromegaly LUNGS:  Clear to auscultation bilaterally HEART:  PMI not displaced or sustained,S1 and S2 within normal limits, no S3, no S4, no clicks, no rubs, no murmurs ABD:  Flat, positive bowel sounds normal in frequency in pitch, no bruits, no rebound, no guarding, no midline pulsatile mass, no hepatomegaly, no splenomegaly EXT:  2 plus pulses throughout, no edema, no cyanosis no clubbing  ASSESSMENT AND PLAN  ATRIAL FIBRILLATION:  He will remain off of amiodarone.  If he has more symptoms we will consider further therapies.  He will remain on warfarin.   HTN:  The blood  pressure is at target. No change in medications is indicated. We will continue with therapeutic lifestyle changes (TLC).  OVERWEIGHT:  The patient understands the need to lose weight with diet and exercise. We have again discussed specific strategies for this.

## 2014-01-13 NOTE — Patient Instructions (Signed)
The current medical regimen is effective;  continue present plan and medications.  Follow up in 6 months with Dr Hochrein.  You will receive a letter in the mail 2 months before you are due.  Please call us when you receive this letter to schedule your follow up appointment.  

## 2014-01-16 ENCOUNTER — Encounter: Payer: Self-pay | Admitting: Neurology

## 2014-01-23 ENCOUNTER — Ambulatory Visit (INDEPENDENT_AMBULATORY_CARE_PROVIDER_SITE_OTHER): Payer: Medicare Other | Admitting: *Deleted

## 2014-01-23 DIAGNOSIS — I4891 Unspecified atrial fibrillation: Secondary | ICD-10-CM

## 2014-01-23 DIAGNOSIS — Z7901 Long term (current) use of anticoagulants: Secondary | ICD-10-CM

## 2014-01-23 DIAGNOSIS — Z5181 Encounter for therapeutic drug level monitoring: Secondary | ICD-10-CM

## 2014-01-23 LAB — POCT INR: INR: 2.1

## 2014-01-24 ENCOUNTER — Encounter: Payer: Self-pay | Admitting: Neurology

## 2014-01-27 ENCOUNTER — Encounter: Payer: Self-pay | Admitting: Neurology

## 2014-01-28 ENCOUNTER — Other Ambulatory Visit: Payer: Self-pay

## 2014-01-28 MED ORDER — DILTIAZEM HCL ER 120 MG PO CP12: 120.0000 mg | ORAL_CAPSULE | Freq: Two times a day (BID) | ORAL | Status: DC

## 2014-02-06 ENCOUNTER — Encounter: Payer: Self-pay | Admitting: Neurology

## 2014-02-14 ENCOUNTER — Encounter (INDEPENDENT_AMBULATORY_CARE_PROVIDER_SITE_OTHER): Payer: Self-pay

## 2014-02-14 ENCOUNTER — Ambulatory Visit (INDEPENDENT_AMBULATORY_CARE_PROVIDER_SITE_OTHER): Payer: Medicare Other | Admitting: Neurology

## 2014-02-14 ENCOUNTER — Encounter: Payer: Self-pay | Admitting: Neurology

## 2014-02-14 VITALS — BP 126/67 | HR 57 | Resp 18 | Ht 69.0 in | Wt 267.0 lb

## 2014-02-14 DIAGNOSIS — G620 Drug-induced polyneuropathy: Secondary | ICD-10-CM

## 2014-02-14 DIAGNOSIS — Z9989 Dependence on other enabling machines and devices: Principal | ICD-10-CM

## 2014-02-14 DIAGNOSIS — G4733 Obstructive sleep apnea (adult) (pediatric): Secondary | ICD-10-CM

## 2014-02-14 DIAGNOSIS — I4891 Unspecified atrial fibrillation: Secondary | ICD-10-CM

## 2014-02-14 NOTE — Progress Notes (Signed)
[facility]  Provider:  Larey [name], M D  Referring Provider: Halford Chessman, MD Primary Care Physician:  Chad Kilts, MD  Chief Complaint  Patient presents with  . Follow-up    Room 10  . Sleep Apnea    HPI:  Chad [name] is a 80 y.o. male  Is seen here as a revisit  from Chad [name] and  Chad [name] , originally for Balance problems, now followed for OSA and CPAP compliance.    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 He also had a history of vitamin B12 deficiency which has been supplemented meanwhile. The patient underwent a sleep study on 10-22-2013 with a BMI of 38.5 and a neck circumference of 18 inches concerned of daytime excessive fatigue. The patient was diagnosed with an AHI of 63.8 and an RDI of 75. His oxygen at nadir was 80% on 19.4 minutes. The patient was titrated to 7 cm water in the AHI was now 0.0 concerning was that he had for about 86 periodic limb movements and his arousal index was 6.2 based on the and in arousals. The oxygen nadir  86% but he still had prolonged desaturations. He was prescribed CPAP at 10 cm water with a Airfit P10 mask.  His cardiologist and primary care physician hava meanwhile weaned him amiodarone  since February - but he states that he feels more fatigued than before- yet on CPAP he sleeps better. His atrial fib remains controlled, but he was warned by Dr. Gerarda [name], that he may be back to the hospital if rate control fails.    His download from [place] documented a dowanload from his AIRSENSE PAP machine. the residual AHI of 2.4 the patient had an 88% compliance rate.  He uses CPAP 5 hours and 41 minutes at night. CPAP is at a pressure of 10 cm water the download is dated 01-15-14 by Chad [name].  Mask fitting issues were addressed with Chad [name].  We discuss today that we will check on overnight oxygen needs  while on CPAP, and that he may nee to return to Amiodarone in the near future. His neuropathy will not improve much off Amiodarone.       Last visit.:  Chad [name] reports that he has developed gait instability and balance problems. He used the term ' I can't walk a straight line anymore" . Reports having fallen 3 times in the last 6 months, he needs to with his gaze to his feet to walk steady. In addition he reports that remotely perhaps 20 years ago he suffered for 4 continuous years from vertical and felt constantly in motion but actually not moving. This episode however concluded that the initiation of niacin. He had  worked  with radioisotopes, and  The exposure was considered another possible cause of his Vertigo. He does not consider his current episodes alike, and  neither does he have syncope or presyncope.  He is ataxic and neuropathic. He takes B 12 supplement.   The patient suffers for long time from intrafibrillation, and ablation was never attempted he reports and according to Dr. Rosezella Florida note ,he has been able to maintain sinus rhythm. Amiodarone causes neuropathies and he has been on this medication now for several years. Actually, decades. Thyroid hormones and liver enzymes are followed by his primary care physician, Dr. Hilma [name].    Review of Systems: Out of a complete 14 system review, the  patient complains of only the following symptoms, and all other reviewed systems are negative.  Soreness over quadriceps and truncal core strength reduced. He has ataxia, and neuropathy.  High  fall risk while on coumadin! Off amiodarone now. Still fatigued.   GDS 2, FSS 43, Epworth 4 points.      History   Social History  . Marital Status: Married    Spouse Name: Chad [name]    Number of Children: 2  . Years of Education: 16   Occupational History  .     Social History Main Topics  . Smoking status: Former Smoker    Types: Cigarettes    Quit date: 12/13/1970  . Smokeless  tobacco: Never Used  . Alcohol Use: No  . Drug Use: No  . Sexual Activity: Not on file   Other Topics Concern  . 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   Patient has two children.   Patient is retired.   Patient has a college education.   Patient is right-handed.   Patient does not drink any caffeine.    Family History  Problem Relation Age of Onset  . Heart failure Father     Past Medical History  Diagnosis Date  . Arrhythmia     atrial fib  . Hypertension   . Hypoglycemia   . 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 Tinnitus of both ears 09/25/2013  . Nystagmus, end-position 09/25/2013  . Sleep apnea     CPAP    Past Surgical History  Procedure Laterality Date  . Lumbar disc surgery  1991    Current Outpatient Prescriptions  Medication Sig Dispense Refill  . Calcium-Magnesium 500-250 MG TABS Take 1 tablet by mouth daily.        . Cholecalciferol 1000 UNIT capsule Take 1,000 Units by mouth 2 (two) times daily.        . Coenzyme Q10 (EQL COQ10) 100 MG capsule Take 100 mg by mouth daily.        . Cyanocobalamin (VITAMIN B 12 PO) Take 100 mcg by mouth 2 (two) times daily.       Marland Kitchen diltiazem (CARDIZEM SR) 120 MG 12 hr capsule Take 1 capsule (120 mg total) by mouth 2 (two) times daily.  60 capsule  6  . escitalopram (LEXAPRO) 10 MG tablet Take 10 mg by mouth daily.        . irbesartan (AVAPRO) 300 MG tablet Take 1 tablet (300 mg total) by mouth daily.  90 tablet  3  . Multiple Vitamins-Minerals (CENTRUM SILVER PO) Take 1 tablet by mouth daily.        Marland Kitchen warfarin (COUMADIN) 5 MG tablet As directed by Anticoagulation clinic. Pt is taking 2.5 QD except on Thurs. Pt taker 5 mg on this day only       No current facility-administered medications for this visit.    Allergies as of 02/14/2014 - Review Complete 02/14/2014  Allergen Reaction Noted  . Carbapenems  11/15/2013  . Cephalosporins  11/15/2013  . Ciprofloxacin   11/15/2013  . Decongestant [oxymetazoline]  11/15/2013  . Flagyl [metronidazole] Nausea Only 07/29/2011  . Hctz [hydrochlorothiazide]  11/15/2013  . Levaquin [levofloxacin in d5w]  11/15/2013  . Lidocaine    . Penicillins  07/02/2009  . Sulfonamide derivatives  01/12/2011  . Teline [tetracycline]  11/15/2013  . Tetanus toxoid    . Z-pak [azithromycin]  11/15/2013    Vitals: BP  126/67  Pulse 57  Resp 18  Ht 5\' 9"  (1.753 m)  Wt 267 lb (121.11 kg)  BMI 39.41 kg/m2 Last Weight:  Wt Readings from Last 1 Encounters:  02/14/14 267 lb (121.11 kg)   Last Height:   Ht Readings from Last 1 Encounters:  02/14/14 5\' 9"  (1.753 m)    Physical exam:  General: The patient is awake, alert and appears not in acute distress. The patient is well groomed. Head: Normocephalic, atraumatic. Neck is supple. Mallampati1, neck circumference:15.5.  Cardiovascular:  Regular rate and rhythm , without  murmurs or carotid bruit, and without distended neck veins. Respiratory: Lungs are clear to auscultation. Skin:  Without evidence of edema, or rash Trunk: BMI is elevated .  Neurologic exam : The patient is awake and alert, oriented to place and time.   Memory subjective  described as intact. There is a normal attention span & concentration ability.  Speech is fluent without  dysarthria, dysphonia or aphasia. Mood and affect are appropriate.  Cranial nerves: Pupils are equal and briskly reactive to light.  Funduscopic exam , darkened areas , the  blood vessels appear dark- his iris has a cross like darker pigmentation, no arcus -neither  pallor or edema. Extraocular movements  in vertical planes intact,  and a strong horizontal nystagmus. Slightly more ptosis over the left eye.  Visual fields by finger perimetry are intact.  Hearing to finger rub, bone and air conduction by tuning fork symmetric and  intact.   Facial sensation intact to fine touch. Facial motor strength is symmetric and tongue and  uvula move midline.  Motor exam:   Normal tone and normal muscle bulk and symmetric normal strength in all extremities.  Sensory:  Fine touch, pinprick and vibration were tested in all extremities.  He has lost vibration and filament touch completely over both feet, and above the ankle line. Mid knee high.   Proprioception is tested in the upper extremities only. This was  normal.  Coordination: Rapid alternating movements in the fingers/hands is tested and normal.  Finger-to-nose maneuver tested and normal without evidence of ataxia, dysmetria or tremor. Tandem gait is severely impaired and he has trouble standing narrow  based. Romberg positive,   Gait and station: Patient walks without assistive device and was able to rise form a seated position without bracing himself.  Deep tendon reflexes:  Absent achilles tendon reflex ,  Babinski maneuver response is absent .patella is 1 plus.    Assessment:  After physical and neurologic examination, review of laboratory studies, imaging, neurophysiology testing and pre-existing records, assessment is  Peripheral sensory neuropathy ,  Gait ataxia and nystagmus.  He has tinnitus, bilaterally, neck pain with radiation to the  occiput .    Plan:  Treatment plan and additional workup :  0) OSA severe, on CPAP- AHI is reduced satisfactorily but the patient remains fatigued. ONO needed.  1) Amiodarone induce neuropathy, ataxia;  unchanged after weaning off.  2) PT for gait stabilization .Fall prevention.  I would like for him to take a  Vitamin B complex and to undergo a sleep study for his reported sleep snoring and witnessed apnea. Ordered ONO

## 2014-02-14 NOTE — Patient Instructions (Signed)
Atrial Fibrillation  Atrial fibrillation is a type of irregular heart rhythm (arrhythmia). During atrial fibrillation, the upper chambers of the heart (atria) quiver continuously in a chaotic pattern. This causes an irregular and often rapid heart rate.   Atrial fibrillation is the result of the heart becoming overloaded with disorganized signals that tell it to beat. These signals are normally released one at a time by a part of the right atrium called the sinoatrial node. They then travel from the atria to the lower chambers of the heart (ventricles), causing the atria and ventricles to contract and pump blood as they pass. In atrial fibrillation, parts of the atria outside of the sinoatrial node also release these signals. This results in two problems. First, the atria receive so many signals that they do not have time to fully contract. Second, the ventricles, which can only receive one signal at a time, beat irregularly and out of rhythm with the atria.   There are three types of atrial fibrillation:    Paroxysmal Paroxysmal atrial fibrillation starts suddenly and stops on its own within a week.    Persistent Persistent atrial fibrillation lasts for more than a week. It may stop on its own or with treatment.    Permanent Permanent atrial fibrillation does not go away. Episodes of atrial fibrillation may lead to permanent atrial fibrillation.   Atrial fibrillation can prevent your heart from pumping blood normally. It increases your risk of stroke and can lead to heart failure.   CAUSES    Heart conditions, including a heart attack, heart failure, coronary artery disease, and heart valve conditions.    Inflammation of the sac that surrounds the heart (pericarditis).    Blockage of an artery in the lungs (pulmonary embolism).    Pneumonia or other infections.    Chronic lung disease.    Thyroid problems, especially if the thyroid is overactive (hyperthyroidism).    Caffeine, excessive alcohol  use, and use of some illegal drugs.    Use of some medications, including certain decongestants and diet pills.    Heart surgery.    Birth defects.   Sometimes, no cause can be found. When this happens, the atrial fibrillation is called lone atrial fibrillation. The risk of complications from atrial fibrillation increases if you have lone atrial fibrillation and you are age 60 years or older.  RISK FACTORS   Heart failure.   Coronary artery disease   Diabetes mellitus.    High blood pressure (hypertension).    Obesity.    Other arrhythmias.    Increased age.  SYMPTOMS    A feeling that your heart is beating rapidly or irregularly.    A feeling of discomfort or pain in your chest.    Shortness of breath.    Sudden lightheadedness or weakness.    Getting tired easily when exercising.    Urinating more often than normal (mainly when atrial fibrillation first begins).   In paroxysmal atrial fibrillation, symptoms may start and suddenly stop.  DIAGNOSIS   Your caregiver may be able to detect atrial fibrillation when taking your pulse. Usually, testing is needed to diagnosis atrial fibrillation. Tests may include:    Electrocardiography. During this test, the electrical impulses of your heart are recorded while you are lying down.    Echocardiography. During echocardiography, sound waves are used to evaluate how blood flows through your heart.    Stress test. There is more than one type of stress test. If a stress test is   needed, ask your caregiver about which type is best for you.    Chest X-ray exam.    Blood tests.    Computed tomography (CT).   TREATMENT    Treating any underlying conditions. For example, if you have an overactive thyroid, treating the condition may correct atrial fibrillation.    Medication. Medications may be given to control a rapid heart rate or to prevent blood clots, heart failure, or a stroke.    Procedure to correct the rhythm of the  heart:   Electrical cardioversion. During electrical cardioversion, a controlled, low-energy shock is delivered to the heart through your skin. If you have chest pain, very low pressure blood pressure, or sudden heart failure, this procedure may need to be done as an emergency.   Catheter ablation. During this procedure, heart tissues that send the signals that cause atrial fibrillation are destroyed.   Maze or minimaze procedure. During this surgery, thin lines of heart tissue that carry the abnormal signals are destroyed. The maze procedure is an open-heart surgery. The minimaze procedure is a minimally invasive surgery. This means that small cuts are made to access the heart instead of a large opening.   Pulmonary venous isolation. During this surgery, tissue around the veins that carry blood from the lungs (pulmonary veins) is destroyed. This tissue is thought to carry the abnormal signals.  HOME CARE INSTRUCTIONS    Take medications as directed by your caregiver.   Only take medications that your caregiver approves. Some medications can make atrial fibrillation worse or recur.   If blood thinners were prescribed by your caregiver, take them exactly as directed. Too much can cause bleeding. Too little and you will not have the needed protection against stroke and other problems.   Perform blood tests at home if directed by your caregiver.   Perform blood tests exactly as directed.    Quit smoking if you smoke.    Do not drink alcohol.    Do not drink caffeinated beverages such as coffee, soda, and some teas. You may drink decaffeinated coffee, soda, or tea.    Maintain a healthy weight. Do not use diet pills unless your caregiver approves. They may make heart problems worse.    Follow diet instructions as directed by your caregiver.    Exercise regularly as directed by your caregiver.    Keep all follow-up appointments.  PREVENTION   The following substances can cause atrial fibrillation  to recur:    Caffeinated beverages.    Alcohol.    Certain medications, especially those used for breathing problems.    Certain herbs and herbal medications, such as those containing ephedra or ginseng.   Illegal drugs such as cocaine and amphetamines.  Sometimes medications are given to prevent atrial fibrillation from recurring. Proper treatment of any underlying condition is also important in helping prevent recurrence.   SEEK MEDICAL CARE IF:   You notice a change in the rate, rhythm, or strength of your heartbeat.    You suddenly begin urinating more frequently.    You tire more easily when exerting yourself or exercising.   SEEK IMMEDIATE MEDICAL CARE IF:    You develop chest pain, abdominal pain, sweating, or weakness.   You feel sick to your stomach (nauseous).   You develop shortness of breath.   You suddenly develop swollen feet and ankles.   You feel dizzy.   You face or limbs feel numb or weak.   There is a change in your   vision or speech.  MAKE SURE YOU:    Understand these instructions.   Will watch your condition.   Will get help right away if you are not doing well or get worse.  Document Released: 10/31/2005 Document Revised: 02/25/2013 Document Reviewed: 12/11/2012  [employer] Patient Information 2014 [employer]

## 2014-02-20 ENCOUNTER — Ambulatory Visit (INDEPENDENT_AMBULATORY_CARE_PROVIDER_SITE_OTHER): Payer: Medicare Other | Admitting: *Deleted

## 2014-02-20 DIAGNOSIS — Z5181 Encounter for therapeutic drug level monitoring: Secondary | ICD-10-CM

## 2014-02-20 DIAGNOSIS — I4891 Unspecified atrial fibrillation: Secondary | ICD-10-CM

## 2014-02-20 DIAGNOSIS — Z7901 Long term (current) use of anticoagulants: Secondary | ICD-10-CM

## 2014-02-20 LAB — POCT INR: INR: 1.9

## 2014-02-24 ENCOUNTER — Encounter: Payer: Self-pay | Admitting: Neurology

## 2014-02-25 ENCOUNTER — Telehealth: Payer: Self-pay | Admitting: Neurology

## 2014-02-25 ENCOUNTER — Encounter: Payer: Self-pay | Admitting: Neurology

## 2014-02-25 DIAGNOSIS — Z9989 Dependence on other enabling machines and devices: Secondary | ICD-10-CM

## 2014-02-25 DIAGNOSIS — R0902 Hypoxemia: Secondary | ICD-10-CM

## 2014-02-25 DIAGNOSIS — G4733 Obstructive sleep apnea (adult) (pediatric): Secondary | ICD-10-CM

## 2014-02-25 NOTE — Telephone Encounter (Signed)
This patient overnight pulse oximetry date at-10-15 documented 8 hours of recorded time and 23 minutes and 12 seconds at the level 89% oxygenation.  The nadir was 81% saturation,  the study was performed overnight and while on CPAP.   I will order  2 L of oxygen to be bled  into the CPAP machine at night only.  Ailea Rhatigan M.D.

## 2014-02-28 ENCOUNTER — Telehealth: Payer: Self-pay | Admitting: Cardiology

## 2014-02-28 NOTE — Telephone Encounter (Signed)
New message     Patient calling C/O heart rate in the 100 for 3 days now.  No sob.

## 2014-02-28 NOTE — Telephone Encounter (Signed)
Patient reports HR low 90s at rest up to 110s when walking stairs for last 3 days. He is monitoring with a pulse oximetry finger probe. Pedal/ankle edema present always, little increased today. Felt radial pulse and states that it is currently regular. Denies SOB, lightheadedness, pounding in chest, fatigue. Doing normal daily activities.  Discussed his hx of a fib. Taking warfarin.  Amiodarone dc'd in Jan due to neuropathy.  Still on cardizem bid Instructed him to go to ED if any of the above symptoms occur.  Otherwise, appointment made with PA for in 2 weeks. Instructed patient to put the oximeter away unless he feels symptomatic.

## 2014-03-04 NOTE — Telephone Encounter (Signed)
0xygen 0rder. Processed? CD

## 2014-03-13 ENCOUNTER — Ambulatory Visit (INDEPENDENT_AMBULATORY_CARE_PROVIDER_SITE_OTHER): Payer: Medicare Other | Admitting: *Deleted

## 2014-03-13 DIAGNOSIS — I4891 Unspecified atrial fibrillation: Secondary | ICD-10-CM

## 2014-03-13 DIAGNOSIS — Z5181 Encounter for therapeutic drug level monitoring: Secondary | ICD-10-CM

## 2014-03-13 DIAGNOSIS — Z7901 Long term (current) use of anticoagulants: Secondary | ICD-10-CM

## 2014-03-13 LAB — POCT INR: INR: 1.9

## 2014-03-14 ENCOUNTER — Encounter: Payer: Self-pay | Admitting: Nurse Practitioner

## 2014-03-14 ENCOUNTER — Ambulatory Visit (INDEPENDENT_AMBULATORY_CARE_PROVIDER_SITE_OTHER): Payer: Medicare Other | Admitting: Nurse Practitioner

## 2014-03-14 VITALS — BP 128/65 | HR 67 | Ht 70.0 in | Wt 266.0 lb

## 2014-03-14 DIAGNOSIS — G4733 Obstructive sleep apnea (adult) (pediatric): Secondary | ICD-10-CM

## 2014-03-14 DIAGNOSIS — G4710 Hypersomnia, unspecified: Secondary | ICD-10-CM

## 2014-03-14 DIAGNOSIS — G4730 Sleep apnea, unspecified: Secondary | ICD-10-CM

## 2014-03-14 DIAGNOSIS — Z9989 Dependence on other enabling machines and devices: Principal | ICD-10-CM

## 2014-03-14 NOTE — Progress Notes (Signed)
I agree with the assessment and plan as directed by NP .The patient is known to me .   Mane Consolo, MD  

## 2014-03-14 NOTE — Progress Notes (Signed)
PATIENT: Chad [name] DOB: September 18, 1929  REASON FOR VISIT: follow up HISTORY FROM: patient  HISTORY OF PRESENT ILLNESS: Chad [name] is a 80 y.o. male Is seen here as a revisit from Dr. Percival Spanish and Chad [name] , originally for Balance problems, now followed for OSA and CPAP compliance.   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 He also had a history of vitamin B12 deficiency which has been supplemented meanwhile. The patient underwent a sleep study on 10-22-2013 with a BMI of 38.5 and a neck circumference of 18 inches concerned of daytime excessive fatigue. The patient was diagnosed with an AHI of 63.8 and an RDI of 75.  His oxygen at nadir was 80% on 19.4 minutes. The patient was titrated to 7 cm water in the AHI was now 0.0 concerning was that he had for about 86 periodic limb movements and his arousal index was 6.2 based on the and in arousals. The oxygen nadir 86% but he still had prolonged desaturations. He was prescribed CPAP at 10 cm water with a Airfit P10 mask.  His cardiologist and primary care physician hava meanwhile weaned him amiodarone since February - but he states that he feels more fatigued than before- yet on CPAP he sleeps better.  His atrial fib remains controlled, but he was warned by Dr. Gerarda Fraction, that he may be back to the hospital if rate control fails.   His download from [place] documented a download from his AIRSENSE PAP machine. the residual AHI of 2.4 the patient had an 88% compliance rate. He uses CPAP 5 hours and 41 minutes at night. CPAP is at a pressure of 10 cm water the download is dated 01-15-14 by Chad [name]. Mask fitting issues were addressed with Mr. Chad [name]. We discuss today that we will check on overnight oxygen needs while on CPAP, and that he may nee to return to Amiodarone in the near future. His neuropathy will not improve much off Amiodarone.    Last visit.:  Chad [name] reports that he has developed gait instability and balance problems. He used the term ' I can't walk a straight line anymore" .  Reports having fallen 3 times in the last 6 months, he needs to with his gaze to his feet to walk steady. In addition he reports that remotely perhaps 20 years ago he suffered for 4 continuous years from vertical and felt constantly in motion but actually not moving. This episode however concluded that the initiation of niacin.  He had worked with radioisotopes, and The exposure was considered another possible cause of his Vertigo.  He does not consider his current episodes alike, and neither does he have syncope or presyncope.  He is ataxic and neuropathic. He takes B 12 supplement.  The patient suffers for long time from intrafibrillation, and ablation was never attempted he reports and according to Dr. Rosezella [name] note ,he has been able to maintain sinus rhythm.  Amiodarone causes neuropathies and he has been on this medication now for several years. Actually, decades.  Thyroid hormones and liver enzymes are followed by his primary care physician, Dr. Hilma [name].   Update 03/14/14 (LL): patient returns for 4 week revisit to go over ONO and CPCP review, he did not bring cpap or chip.   This patient overnight pulse oximetry date at-10-15 documented 8 hours of recorded time and 23 minutes and 12 seconds at the level 89% oxygenation.  The nadir  was 81% saturation, the study was performed overnight and while on CPAP.  He has been ordered 2L O2 at night and is tolerating it well. Cpap download date range was 47 days, CPAP pressure of 10 cm of water, and average our to the usage per night is 5 hours 35 minutes. Download compliance percentage is 78%. AHI noted on sleep study was 63.8, AHI noted on download is 2.6. Apnea index 2.6. Hypopnea and index 0.  He still endorses significant daytime fatigue.  Review of Systems:  Out of a complete 14 system review, the  patient complains of only the following symptoms, and all other reviewed systems are negative.  Soreness over quadriceps and truncal core strength reduced. He has ataxia, and neuropathy.  High fall risk while on coumadin! Off amiodarone now. Still fatigued.  FSS 39, down from 43 last visit, Epworth 4 points, same as last visit  ALLERGIES: Allergies  Allergen Reactions  . Carbapenems   . Cephalosporins   . Ciprofloxacin   . Decongestant [Oxymetazoline]   . Flagyl [Metronidazole] Nausea Only  . Hctz [Hydrochlorothiazide]   . Levaquin [Levofloxacin In D5w]   . Lidocaine   . Penicillins   . Sulfonamide Derivatives   . Teline [Tetracycline]   . Tetanus Toxoid   . Z-Pak [Azithromycin]     HOME MEDICATIONS: Outpatient Prescriptions Prior to Visit  Medication Sig Dispense Refill  . Calcium-Magnesium 500-250 MG TABS Take 1 tablet by mouth daily.        . Cholecalciferol 1000 UNIT capsule Take 1,000 Units by mouth 2 (two) times daily.        . Coenzyme Q10 (EQL COQ10) 100 MG capsule Take 200 mg by mouth daily.       . Cyanocobalamin (VITAMIN B 12 PO) Take 100 mcg by mouth 2 (two) times daily.       Marland Kitchen diltiazem (CARDIZEM SR) 120 MG 12 hr capsule Take 1 capsule (120 mg total) by mouth 2 (two) times daily.  60 capsule  6  . escitalopram (LEXAPRO) 10 MG tablet Take 10 mg by mouth daily.        . irbesartan (AVAPRO) 300 MG tablet Take 1 tablet (300 mg total) by mouth daily.  90 tablet  3  . Multiple Vitamins-Minerals (CENTRUM SILVER PO) Take 1 tablet by mouth daily.        Marland Kitchen warfarin (COUMADIN) 5 MG tablet As directed by Anticoagulation clinic. Pt is taking 2.5 QD except on Thurs. Pt taker 5 mg on this day only       No facility-administered medications prior to visit.     PHYSICAL EXAM  Filed Vitals:   03/14/14 1100  BP: 128/65  Pulse: 67  Height: 5\' 10"  (1.778 m)  Weight: 266 lb (120.657 kg)   Body mass index is 38.17 kg/(m^2).  Generalized: Well developed, in no acute distress    Head: normocephalic and atraumatic. Oropharynx benign  Neck: Supple, no carotid bruits  Cardiac: Regular rate rhythm, no murmur  Musculoskeletal: No deformity   Neurological examination  Mentation: Alert oriented to time, place, history taking. Follows all commands speech and language fluent Cranial nerve II-XII: Pupils are equal and briskly reactive to light. Extraocular movements in vertical planes intact, and a strong horizontal nystagmus. Slightly more ptosis over the left eye. Visual fields by finger perimetry are intact.  Hearing to finger rub, bone and air conduction by tuning fork symmetric and intact. Facial sensation intact to fine touch. Facial motor strength is symmetric and  tongue and uvula move midline.  Motor: The motor testing reveals 5 over 5 strength of all 4 extremities. Good symmetric motor tone is noted throughout.  Sensory: He has lost vibration and filament touch completely over both feet, and above the ankle line. Mid knee high.  Coordination: Cerebellar testing reveals good finger-nose-finger and heel-to-shin bilaterally.  Gait and station: Tandem gait is severely impaired and he has trouble standing narrow based. Romberg positive Reflexes: Absent achilles tendon reflex , Babinski maneuver response is absent. Patella is 1 plus.   ASSESSMENT AND PLAN 80 y.o. 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  After ONO study, 2 L oxygen was added to his CPAP at night and he has been tolerating well.    PLAN: Continue CPAP on current settings with 2 L oxygen Followup visit in 6 months, be sure to bring CPAP machine to every visit.  Philmore Pali, MSN, NP-C 03/14/2014, 11:19 AM [facility] [address], [place], [place]  [address] [phone]  Note: This document was prepared with digital dictation and possible smart phrase technology. Any transcriptional errors that result from this process are unintentional.

## 2014-03-14 NOTE — Patient Instructions (Signed)
PLEASE BRING CPAP MACHINE TO EVERY OFFICE VISIT.  Follow up in 6 months with Dr. Brett Fairy.  Call sooner as needed.   Sleep Apnea  Sleep apnea is a sleep disorder characterized by abnormal pauses in breathing while you sleep. When your breathing pauses, the level of oxygen in your blood decreases. This causes you to move out of deep sleep and into light sleep. As a result, your quality of sleep is poor, and the system that carries your blood throughout your body (cardiovascular system) experiences stress. If sleep apnea remains untreated, the following conditions can develop:  High blood pressure (hypertension).  Coronary artery disease.  Inability to achieve or maintain an erection (impotence).  Impairment of your thought process (cognitive dysfunction). There are three types of sleep apnea: 1. Obstructive sleep apnea Pauses in breathing during sleep because of a blocked airway. 2. Central sleep apnea Pauses in breathing during sleep because the area of the brain that controls your breathing does not send the correct signals to the muscles that control breathing. 3. Mixed sleep apnea A combination of both obstructive and central sleep apnea. RISK FACTORS The following risk factors can increase your risk of developing sleep apnea:  Being overweight.  Smoking.  Having narrow passages in your nose and throat.  Being of older age.  Being male.  Alcohol use.  Sedative and tranquilizer use.  Ethnicity. Among individuals younger than 35 years, African Americans are at increased risk of sleep apnea. SYMPTOMS   Difficulty staying asleep.  Daytime sleepiness and fatigue.  Loss of energy.  Irritability.  Loud, heavy snoring.  Morning headaches.  Trouble concentrating.  Forgetfulness.  Decreased interest in sex. DIAGNOSIS  In order to diagnose sleep apnea, your caregiver will perform a physical examination. Your caregiver may suggest that you take a home sleep test. Your  caregiver may also recommend that you spend the night in a sleep lab. In the sleep lab, several monitors record information about your heart, lungs, and brain while you sleep. Your leg and arm movements and blood oxygen level are also recorded. TREATMENT The following actions may help to resolve mild sleep apnea:  Sleeping on your side.   Using a decongestant if you have nasal congestion.   Avoiding the use of depressants, including alcohol, sedatives, and narcotics.   Losing weight and modifying your diet if you are overweight. There also are devices and treatments to help open your airway:  Oral appliances. These are custom-made mouthpieces that shift your lower jaw forward and slightly open your bite. This opens your airway.  Devices that create positive airway pressure. This positive pressure "splints" your airway open to help you breathe better during sleep. The following devices create positive airway pressure:  Continuous positive airway pressure (CPAP) device. The CPAP device creates a continuous level of air pressure with an air pump. The air is delivered to your airway through a mask while you sleep. This continuous pressure keeps your airway open.  Nasal expiratory positive airway pressure (EPAP) device. The EPAP device creates positive air pressure as you exhale. The device consists of single-use valves, which are inserted into each nostril and held in place by adhesive. The valves create very little resistance when you inhale but create much more resistance when you exhale. That increased resistance creates the positive airway pressure. This positive pressure while you exhale keeps your airway open, making it easier to breath when you inhale again.  Bilevel positive airway pressure (BPAP) device. The BPAP device is used  mainly in patients with central sleep apnea. This device is similar to the CPAP device because it also uses an air pump to deliver continuous air pressure through  a mask. However, with the BPAP machine, the pressure is set at two different levels. The pressure when you exhale is lower than the pressure when you inhale.  Surgery. Typically, surgery is only done if you cannot comply with less invasive treatments or if the less invasive treatments do not improve your condition. Surgery involves removing excess tissue in your airway to create a wider passage way. Document Released: 10/21/2002 Document Revised: 02/25/2013 Document Reviewed: 03/08/2012 [facility] Patient Information 2014 [place].

## 2014-03-17 ENCOUNTER — Ambulatory Visit: Payer: Medicare Other | Admitting: Physician Assistant

## 2014-03-17 NOTE — Progress Notes (Signed)
I agree with the assessment and plan as directed by NP .The patient is known to me .   Oswald Pott, MD  

## 2014-03-27 ENCOUNTER — Ambulatory Visit (INDEPENDENT_AMBULATORY_CARE_PROVIDER_SITE_OTHER): Payer: Medicare Other | Admitting: *Deleted

## 2014-03-27 DIAGNOSIS — I4891 Unspecified atrial fibrillation: Secondary | ICD-10-CM

## 2014-03-27 DIAGNOSIS — Z7901 Long term (current) use of anticoagulants: Secondary | ICD-10-CM

## 2014-03-27 DIAGNOSIS — Z5181 Encounter for therapeutic drug level monitoring: Secondary | ICD-10-CM

## 2014-03-27 LAB — POCT INR: INR: 2.4

## 2014-04-24 ENCOUNTER — Ambulatory Visit (INDEPENDENT_AMBULATORY_CARE_PROVIDER_SITE_OTHER): Payer: Medicare Other | Admitting: *Deleted

## 2014-04-24 ENCOUNTER — Encounter: Payer: Self-pay | Admitting: *Deleted

## 2014-04-24 DIAGNOSIS — Z7901 Long term (current) use of anticoagulants: Secondary | ICD-10-CM

## 2014-04-24 DIAGNOSIS — Z5181 Encounter for therapeutic drug level monitoring: Secondary | ICD-10-CM

## 2014-04-24 DIAGNOSIS — I4891 Unspecified atrial fibrillation: Secondary | ICD-10-CM

## 2014-04-24 LAB — POCT INR: INR: 2.9

## 2014-05-22 ENCOUNTER — Ambulatory Visit (HOSPITAL_COMMUNITY)
Admission: RE | Admit: 2014-05-22 | Discharge: 2014-05-22 | Disposition: A | Discharge status: Home / Self Care | Payer: Medicare Other | Source: Ambulatory Visit | Attending: Family Medicine | Admitting: Family Medicine

## 2014-05-22 ENCOUNTER — Other Ambulatory Visit (HOSPITAL_COMMUNITY): Payer: Self-pay | Admitting: Family Medicine

## 2014-05-22 ENCOUNTER — Ambulatory Visit (INDEPENDENT_AMBULATORY_CARE_PROVIDER_SITE_OTHER): Payer: Medicare Other | Admitting: *Deleted

## 2014-05-22 DIAGNOSIS — I482 Chronic atrial fibrillation, unspecified: Secondary | ICD-10-CM

## 2014-05-22 DIAGNOSIS — Z5181 Encounter for therapeutic drug level monitoring: Secondary | ICD-10-CM

## 2014-05-22 DIAGNOSIS — M7989 Other specified soft tissue disorders: Secondary | ICD-10-CM

## 2014-05-22 DIAGNOSIS — I8390 Asymptomatic varicose veins of unspecified lower extremity: Secondary | ICD-10-CM | POA: Insufficient documentation

## 2014-05-22 DIAGNOSIS — Z7901 Long term (current) use of anticoagulants: Secondary | ICD-10-CM

## 2014-05-22 DIAGNOSIS — I4891 Unspecified atrial fibrillation: Secondary | ICD-10-CM

## 2014-05-22 DIAGNOSIS — R609 Edema, unspecified: Secondary | ICD-10-CM | POA: Insufficient documentation

## 2014-05-22 LAB — POCT INR: INR: 2.8

## 2014-07-03 ENCOUNTER — Ambulatory Visit (INDEPENDENT_AMBULATORY_CARE_PROVIDER_SITE_OTHER): Payer: Medicare Other | Admitting: *Deleted

## 2014-07-03 DIAGNOSIS — Z7901 Long term (current) use of anticoagulants: Secondary | ICD-10-CM

## 2014-07-03 DIAGNOSIS — I4891 Unspecified atrial fibrillation: Secondary | ICD-10-CM

## 2014-07-03 DIAGNOSIS — I482 Chronic atrial fibrillation, unspecified: Secondary | ICD-10-CM

## 2014-07-03 DIAGNOSIS — Z5181 Encounter for therapeutic drug level monitoring: Secondary | ICD-10-CM

## 2014-07-03 LAB — POCT INR: INR: 2.2

## 2014-07-23 ENCOUNTER — Encounter: Payer: Self-pay | Admitting: Cardiology

## 2014-07-23 ENCOUNTER — Ambulatory Visit (INDEPENDENT_AMBULATORY_CARE_PROVIDER_SITE_OTHER): Payer: Medicare Other | Admitting: Cardiology

## 2014-07-23 VITALS — BP 132/70 | HR 57 | Ht 70.0 in | Wt 267.0 lb

## 2014-07-23 DIAGNOSIS — I482 Chronic atrial fibrillation, unspecified: Secondary | ICD-10-CM

## 2014-07-23 DIAGNOSIS — I4891 Unspecified atrial fibrillation: Secondary | ICD-10-CM

## 2014-07-23 MED ORDER — METOPROLOL SUCCINATE ER 50 MG PO TB24: 50.0000 mg | ORAL_TABLET | Freq: Every day | ORAL | Status: DC

## 2014-07-23 NOTE — Progress Notes (Signed)
HPI The patient presents for followup of atrial fibrillation. He has been taken off of his amiodarone because of neuropathy although is not clear that this was related. He has had some palpitations but these are relatively infrequent. However, he has had increasing lower extremities swelling. This was both legs and then he was told to reduce his Cardizem to once daily. He now has less swelling in the right leg but still continues with some in the left. He's had no change in his weight. He's had no new shortness of breath, PND or orthopnea.  He denies any chest pressure, neck or arm discomfort.  Allergies  Allergen Reactions  . Carbapenems   . Cephalosporins   . Ciprofloxacin   . Decongestant [Oxymetazoline]   . Flagyl [Metronidazole] Nausea Only  . Hctz [Hydrochlorothiazide]   . Levaquin [Levofloxacin In D5w]   . Lidocaine   . Penicillins   . Sulfonamide Derivatives   . Teline [Tetracycline]   . Tetanus Toxoid   . Z-Pak [Azithromycin]     Current Outpatient Prescriptions  Medication Sig Dispense Refill  . Calcium-Magnesium 500-250 MG TABS Take 1 tablet by mouth daily.        . Cholecalciferol 1000 UNIT capsule Take 1,000 Units by mouth 2 (two) times daily.        . Coenzyme Q10 (EQL COQ10) 100 MG capsule Take 200 mg by mouth daily.       . Cyanocobalamin (VITAMIN B 12 PO) Take 100 mcg by mouth 2 (two) times daily.       Marland Kitchen diltiazem (CARDIZEM SR) 120 MG 12 hr capsule Take 1 capsule (120 mg total) by mouth 2 (two) times daily.  60 capsule  6  . escitalopram (LEXAPRO) 10 MG tablet Take 10 mg by mouth daily.        . irbesartan (AVAPRO) 300 MG tablet Take 1 tablet (300 mg total) by mouth daily.  90 tablet  3  . Multiple Vitamins-Minerals (CENTRUM SILVER PO) Take 1 tablet by mouth daily.        Marland Kitchen warfarin (COUMADIN) 5 MG tablet As directed by Anticoagulation clinic. Pt is taking 2.5 QD except on Thurs. Pt taker 5 mg on this day only       No current facility-administered medications  for this visit.    Past Medical History  Diagnosis Date  . Arrhythmia     atrial fib  . Hypertension   . Hypoglycemia   . 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 Tinnitus of both ears 09/25/2013  . Nystagmus, end-position 09/25/2013  . Sleep apnea     CPAP    Past Surgical History  Procedure Laterality Date  . Lumbar disc surgery  1991    ROS:   As stated in the HPI and negative for all other systems.  PHYSICAL EXAM BP 132/70  Pulse 57  Ht 5\' 10"  (1.778 m)  Wt 267 lb (121.11 kg)  BMI 38.31 kg/m2 GENERAL:  Well appearing HEENT:  PERRL NECK:  No jugular venous distention, waveform within normal limits, carotid upstroke brisk and symmetric, no bruits, no thyromegaly LUNGS:  Clear to auscultation bilaterally HEART:  PMI not displaced or sustained,S1 and S2 within normal limits, no S3, no S4, no clicks, no rubs, no murmurs ABD:  Flat, positive bowel sounds normal in frequency in pitch, no bruits, no rebound, no guarding, no midline pulsatile mass, no hepatomegaly, no splenomegaly EXT:  2 plus pulses throughout, left greater than right lower extremity edema edema, no cyanosis  no clubbing NEURO:  Nonfocal SKIN:  No rashes or nodules.   EKG:  Sinus rhythm, rate 57, sinus arrhythmia, lateral T-wave inversions, , no significant change from previous. 07/23/2014   ASSESSMENT AND PLAN  ATRIAL FIBRILLATION:  He will remain off of amiodarone.  If he has more symptoms we will consider further therapies.  He will remain on warfarin.  However, given his lower extremities swelling I will stop his Cardizem and begin metoprolol. He will let me know if he has any increase in his palpitations.  HTN:  The blood pressure is at target. No change in medications is indicated. We will continue with therapeutic lifestyle changes (TLC).  OVERWEIGHT:  The patient understands the need to lose weight with diet and exercise. We have again discussed specific strategies for this.

## 2014-07-23 NOTE — Patient Instructions (Signed)
Your physician recommends that you schedule a follow-up appointment in: one year with Dr. Percival Spanish  stop taking your cardizem  Start taking Toprol 50 mg daily

## 2014-08-14 ENCOUNTER — Ambulatory Visit (INDEPENDENT_AMBULATORY_CARE_PROVIDER_SITE_OTHER): Payer: Medicare Other | Admitting: *Deleted

## 2014-08-14 DIAGNOSIS — Z5181 Encounter for therapeutic drug level monitoring: Secondary | ICD-10-CM

## 2014-08-14 DIAGNOSIS — I4891 Unspecified atrial fibrillation: Secondary | ICD-10-CM

## 2014-08-14 DIAGNOSIS — Z7901 Long term (current) use of anticoagulants: Secondary | ICD-10-CM

## 2014-08-14 LAB — POCT INR: INR: 2.1

## 2014-09-16 ENCOUNTER — Other Ambulatory Visit (HOSPITAL_COMMUNITY): Payer: Self-pay | Admitting: Internal Medicine

## 2014-09-16 DIAGNOSIS — D493 Neoplasm of unspecified behavior of breast: Secondary | ICD-10-CM

## 2014-09-23 ENCOUNTER — Ambulatory Visit (HOSPITAL_COMMUNITY)
Admission: RE | Admit: 2014-09-23 | Discharge: 2014-09-23 | Disposition: A | Discharge status: Home / Self Care | Payer: Medicare Other | Source: Ambulatory Visit | Attending: Family Medicine | Admitting: Family Medicine

## 2014-09-23 ENCOUNTER — Other Ambulatory Visit (HOSPITAL_COMMUNITY): Payer: Self-pay | Admitting: Family Medicine

## 2014-09-23 ENCOUNTER — Ambulatory Visit (HOSPITAL_COMMUNITY)
Admission: RE | Admit: 2014-09-23 | Discharge: 2014-09-23 | Disposition: A | Discharge status: Home / Self Care | Payer: Medicare Other | Source: Ambulatory Visit | Attending: Internal Medicine | Admitting: Internal Medicine

## 2014-09-23 ENCOUNTER — Other Ambulatory Visit: Payer: Self-pay | Admitting: Dermatology

## 2014-09-23 DIAGNOSIS — D493 Neoplasm of unspecified behavior of breast: Secondary | ICD-10-CM

## 2014-09-23 DIAGNOSIS — IMO0002 Reserved for concepts with insufficient information to code with codable children: Secondary | ICD-10-CM

## 2014-09-23 DIAGNOSIS — R229 Localized swelling, mass and lump, unspecified: Principal | ICD-10-CM

## 2014-09-24 ENCOUNTER — Ambulatory Visit (INDEPENDENT_AMBULATORY_CARE_PROVIDER_SITE_OTHER): Payer: Medicare Other | Admitting: *Deleted

## 2014-09-24 DIAGNOSIS — Z7901 Long term (current) use of anticoagulants: Secondary | ICD-10-CM

## 2014-09-24 DIAGNOSIS — Z5181 Encounter for therapeutic drug level monitoring: Secondary | ICD-10-CM

## 2014-09-24 DIAGNOSIS — I4891 Unspecified atrial fibrillation: Secondary | ICD-10-CM

## 2014-09-24 LAB — POCT INR: INR: 2.4

## 2014-10-24 ENCOUNTER — Ambulatory Visit (HOSPITAL_COMMUNITY)
Admission: RE | Admit: 2014-10-24 | Discharge: 2014-10-24 | Disposition: A | Discharge status: Home / Self Care | Payer: Medicare Other | Source: Ambulatory Visit | Attending: Internal Medicine | Admitting: Internal Medicine

## 2014-10-24 ENCOUNTER — Other Ambulatory Visit (HOSPITAL_COMMUNITY): Payer: Self-pay | Admitting: Internal Medicine

## 2014-10-24 DIAGNOSIS — R079 Chest pain, unspecified: Secondary | ICD-10-CM | POA: Diagnosis not present

## 2014-11-05 ENCOUNTER — Ambulatory Visit (INDEPENDENT_AMBULATORY_CARE_PROVIDER_SITE_OTHER): Payer: Medicare Other | Admitting: *Deleted

## 2014-11-05 DIAGNOSIS — Z7901 Long term (current) use of anticoagulants: Secondary | ICD-10-CM

## 2014-11-05 DIAGNOSIS — I4891 Unspecified atrial fibrillation: Secondary | ICD-10-CM

## 2014-11-05 DIAGNOSIS — Z5181 Encounter for therapeutic drug level monitoring: Secondary | ICD-10-CM

## 2014-11-05 LAB — POCT INR: INR: 3.1

## 2014-11-26 ENCOUNTER — Ambulatory Visit (INDEPENDENT_AMBULATORY_CARE_PROVIDER_SITE_OTHER): Payer: Medicare Other | Admitting: *Deleted

## 2014-11-26 DIAGNOSIS — I4891 Unspecified atrial fibrillation: Secondary | ICD-10-CM

## 2014-11-26 DIAGNOSIS — Z5181 Encounter for therapeutic drug level monitoring: Secondary | ICD-10-CM

## 2014-11-26 DIAGNOSIS — Z7901 Long term (current) use of anticoagulants: Secondary | ICD-10-CM

## 2014-11-26 DIAGNOSIS — I480 Paroxysmal atrial fibrillation: Secondary | ICD-10-CM

## 2014-11-26 LAB — POCT INR: INR: 2.5

## 2014-12-04 ENCOUNTER — Other Ambulatory Visit: Payer: Self-pay

## 2014-12-04 MED ORDER — IRBESARTAN 300 MG PO TABS: 300.0000 mg | ORAL_TABLET | Freq: Every day | ORAL | Status: DC

## 2014-12-24 ENCOUNTER — Ambulatory Visit (INDEPENDENT_AMBULATORY_CARE_PROVIDER_SITE_OTHER): Payer: Medicare Other | Admitting: *Deleted

## 2014-12-24 DIAGNOSIS — Z5181 Encounter for therapeutic drug level monitoring: Secondary | ICD-10-CM

## 2014-12-24 DIAGNOSIS — I480 Paroxysmal atrial fibrillation: Secondary | ICD-10-CM

## 2014-12-24 DIAGNOSIS — I4891 Unspecified atrial fibrillation: Secondary | ICD-10-CM

## 2014-12-24 DIAGNOSIS — Z7901 Long term (current) use of anticoagulants: Secondary | ICD-10-CM

## 2014-12-24 LAB — POCT INR: INR: 2.3

## 2015-01-28 ENCOUNTER — Ambulatory Visit (INDEPENDENT_AMBULATORY_CARE_PROVIDER_SITE_OTHER): Payer: Medicare Other | Admitting: *Deleted

## 2015-01-28 DIAGNOSIS — Z5181 Encounter for therapeutic drug level monitoring: Secondary | ICD-10-CM

## 2015-01-28 DIAGNOSIS — Z7901 Long term (current) use of anticoagulants: Secondary | ICD-10-CM

## 2015-01-28 DIAGNOSIS — I4891 Unspecified atrial fibrillation: Secondary | ICD-10-CM

## 2015-01-28 DIAGNOSIS — I480 Paroxysmal atrial fibrillation: Secondary | ICD-10-CM

## 2015-01-28 LAB — POCT INR: INR: 1.9

## 2015-01-28 MED ORDER — COUMADIN 5 MG PO TABS: ORAL_TABLET | ORAL | Status: DC

## 2015-02-05 ENCOUNTER — Telehealth: Payer: Self-pay | Admitting: Cardiology

## 2015-02-05 NOTE — Telephone Encounter (Signed)
Please asap call,heart is racing at 125.

## 2015-02-05 NOTE — Telephone Encounter (Signed)
Left message for pt to call.

## 2015-02-05 NOTE — Telephone Encounter (Signed)
Tell him if he is uncomfortable he will need to go the ED.  He can increase his metoprolol to 75 mg daily.

## 2015-02-05 NOTE — Telephone Encounter (Signed)
Spoke with pt, he feels he has been out of rhythm about one week now. His heart rate is ranging about 125. He gets SOB with exertion ut otherwise no other symptoms. His bp 113/90. Advised patient to take an extra metoprolol now. Will forward for dr hochrein's review.

## 2015-02-06 NOTE — Telephone Encounter (Signed)
Informed pt of Dr. Rosezella [place] recommendation. He voiced understanding. Will try the increased dose of metoprolol.  BP this AM was 136/91, HR 108  Advised to keep daily checks, note any symptoms, worsening concerns, to contact on-call provider or report to ED. Pt voiced agreement w/ this plan.

## 2015-02-06 NOTE — Telephone Encounter (Signed)
Pt is returning the nurse's call from yesterday and he is unsure of what the call was in regards to. Please f/u  Thanks

## 2015-02-18 ENCOUNTER — Ambulatory Visit (INDEPENDENT_AMBULATORY_CARE_PROVIDER_SITE_OTHER): Payer: Medicare Other | Admitting: *Deleted

## 2015-02-18 DIAGNOSIS — I4891 Unspecified atrial fibrillation: Secondary | ICD-10-CM

## 2015-02-18 DIAGNOSIS — Z7901 Long term (current) use of anticoagulants: Secondary | ICD-10-CM | POA: Diagnosis not present

## 2015-02-18 DIAGNOSIS — Z5181 Encounter for therapeutic drug level monitoring: Secondary | ICD-10-CM | POA: Diagnosis not present

## 2015-02-18 DIAGNOSIS — I480 Paroxysmal atrial fibrillation: Secondary | ICD-10-CM

## 2015-02-18 LAB — POCT INR: INR: 2.6

## 2015-02-25 ENCOUNTER — Telehealth: Payer: Self-pay | Admitting: Cardiology

## 2015-02-25 NOTE — Telephone Encounter (Signed)
Please call,pt still having problem with his heart racing,it is 118 now.If not there call-[phone].

## 2015-02-25 NOTE — Telephone Encounter (Signed)
Re: heart racing  Spoke to patient regarding his symptoms. He describes HR up between 116-125 "for about a month".  States it drops lower than this sometimes, to his baseline HR, around 55-60, but he feels he is in a faster rate most of the time.  Pt had been advised by [facility] to increase his metoprolol from 50mg  daily to 75mg  daily to see if this would improve symptoms.  He does not know if it helped noticeably, but he reports having a couple swings of low BPs (70s/50s) w/ this during the first day or two. He reports BP has been consistent WNR since.  I advised would be appropriate to defer to A Fib clinic for pt to be eval'd by Roderic Palau - pt voiced agreement w/ this plan.

## 2015-02-26 NOTE — Telephone Encounter (Signed)
appt made for 4/15 @ 1030. Patient was agreeable to this

## 2015-02-27 ENCOUNTER — Other Ambulatory Visit (HOSPITAL_COMMUNITY): Payer: Self-pay | Admitting: *Deleted

## 2015-02-27 ENCOUNTER — Encounter (HOSPITAL_COMMUNITY): Payer: Self-pay | Admitting: Nurse Practitioner

## 2015-02-27 ENCOUNTER — Other Ambulatory Visit: Payer: Self-pay

## 2015-02-27 ENCOUNTER — Ambulatory Visit (HOSPITAL_COMMUNITY)
Admission: RE | Admit: 2015-02-27 | Discharge: 2015-02-27 | Disposition: A | Discharge status: Home / Self Care | Payer: Medicare Other | Source: Ambulatory Visit | Attending: Nurse Practitioner | Admitting: Nurse Practitioner

## 2015-02-27 VITALS — BP 130/92 | HR 118 | Ht 70.5 in | Wt 265.8 lb

## 2015-02-27 DIAGNOSIS — Z7901 Long term (current) use of anticoagulants: Secondary | ICD-10-CM | POA: Insufficient documentation

## 2015-02-27 DIAGNOSIS — I4892 Unspecified atrial flutter: Secondary | ICD-10-CM | POA: Diagnosis not present

## 2015-02-27 DIAGNOSIS — I10 Essential (primary) hypertension: Secondary | ICD-10-CM | POA: Insufficient documentation

## 2015-02-27 DIAGNOSIS — I483 Typical atrial flutter: Secondary | ICD-10-CM | POA: Diagnosis not present

## 2015-02-27 DIAGNOSIS — E669 Obesity, unspecified: Secondary | ICD-10-CM | POA: Diagnosis not present

## 2015-02-27 MED ORDER — FLECAINIDE ACETATE 50 MG PO TABS: 50.0000 mg | ORAL_TABLET | Freq: Two times a day (BID) | ORAL | Status: DC

## 2015-02-27 MED ORDER — FLECAINIDE ACETATE 50 MG PO TABS
50.0000 mg | ORAL_TABLET | Freq: Two times a day (BID) | ORAL | Status: DC
Start: 2015-02-27 — End: 2015-02-27

## 2015-02-27 NOTE — Patient Instructions (Signed)
Your physician has recommended you make the following change in your medication:  1)Start Flecainide 50mg  twice a day  Follow up on Tuesday for EKG

## 2015-02-27 NOTE — Progress Notes (Signed)
Patient ID: Chad [name], male   DOB: [birth year], 81 y.o.   MRN: [id number]     HPI Pt being seen in the afib clinic this am for rapid irregular heart beat x 4-6 weeks. Usually with rapid v rates around 120-130. BB recently increased without much improvement. Symptomatic with fatigue and exertional dyspnea but does not appear to be in any distress this am. Had been on amiodarone for many years after having DCCV on vacation one year. Amiodarone was stopped last year in March for neuropathy. Denies alcohol/excessive caffeine/snoring history. EKG reveals a.flutter. Continues on warfarin, last checked 4/6 and therapeutic at 2.6. Has had leg swelling with cardizem in the past.  Allergies  Allergen Reactions  . Carbapenems   . Cephalosporins   . Ciprofloxacin   . Decongestant [Oxymetazoline]   . Flagyl [Metronidazole] Nausea Only  . Hctz [Hydrochlorothiazide]   . Levaquin [Levofloxacin In D5w]   . Lidocaine   . Penicillins   . Sulfonamide Derivatives   . Teline [Tetracycline]   . Tetanus Toxoid   . Z-Pak [Azithromycin]     Current Outpatient Prescriptions  Medication Sig Dispense Refill  . Calcium-Magnesium 500-250 MG TABS Take 1 tablet by mouth daily.      . Cholecalciferol 1000 UNIT capsule Take 1,000 Units by mouth 2 (two) times daily.      . Coenzyme Q10 (EQL COQ10) 100 MG capsule Take 200 mg by mouth daily.     Marland Kitchen COUMADIN 5 MG tablet Take 1/2 tablet daily except 1 tablet on Mondays and Thursdays 30 tablet 3  . Cyanocobalamin (VITAMIN B 12 PO) Take 100 mcg by mouth 2 (two) times daily.     Marland Kitchen escitalopram (LEXAPRO) 10 MG tablet Take 10 mg by mouth daily.      . irbesartan (AVAPRO) 300 MG tablet Take 1 tablet (300 mg total) by mouth daily. 90 tablet 3  . metoprolol succinate (TOPROL-XL) 50 MG 24 hr tablet Take 1 tablet (50 mg total) by mouth daily. Take with or immediately following a meal. (Patient taking differently: Take 75 mg by mouth daily. Take with or immediately following  a meal.) 90 tablet 3  . Multiple Vitamins-Minerals (CENTRUM SILVER PO) Take 1 tablet by mouth daily.      . flecainide (TAMBOCOR) 50 MG tablet Take 1 tablet (50 mg total) by mouth 2 (two) times daily. 60 tablet 1   No current facility-administered medications for this encounter.    Past Medical History  Diagnosis Date  . Arrhythmia     atrial fib  . Hypertension   . Hypoglycemia   . 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 Tinnitus of both ears 09/25/2013  . Nystagmus, end-position 09/25/2013  . Sleep apnea     CPAP    Past Surgical History  Procedure Laterality Date  . Lumbar disc surgery  1991    ROS:   As stated in the HPI and negative for all other systems.  PHYSICAL EXAM BP 130/92 mmHg  Pulse 118  Ht 5' 10.5" (1.791 m)  Wt 265 lb 12.8 oz (120.566 kg)  BMI 37.59 kg/m2 GENERAL:  Well appearing HEENT:  PERRL NECK:  No jugular venous distention, waveform within normal limits, carotid upstroke brisk and symmetric, no bruits, no thyromegaly LUNGS:  Clear to auscultation bilaterally HEART: Rapid, irregular rhythm, no murmurs. urABD:  Flat, positive bowel sounds normal in frequency in pitch, no bruits, no rebound, no guarding, no midline pulsatile mass, no hepatomegaly, no splenomegaly EXT:  2  plus pulses throughout, left greater than right lower extremity edema edema, no cyanosis no clubbing NEURO:  Nonfocal SKIN:  No rashes or nodules.   EKG:  Aflutter(typical ) at 118 bpm. QTC at 538 ms , but when in normal rhythm,  QTc in normal range.QRS 110 bpm  ASSESSMENT AND PLAN  1.Atrial Flutter Reviewed EKG and history with Dr. Rayann Heman. He suggest starting Flecainide 50 bid and bring back within  the week and if still in aflutter to cardiovert. Continue metoprolol. Pt denies h/o coronary disease, nml LV function other than mild LVH. Will need stress test if stays on flecainide. ( Of note, a drug interaction was displayed with addition of flecainide with listed allergy to lidocaine. Pt  can't remember lidocaine allergic response. Reviewed with pharmacist who did not think sufficient interaction not to prescribe)  2.HTN:   Stable  3.Obestiy Weight loss encouraged.  4. Continue warfarin without missed doses.  F/u on Tuesday with EKG.

## 2015-02-28 NOTE — Addendum Note (Signed)
Encounter addended by: Melissa Montane, NT on: 02/28/2015 11:46 AM<BR>     Documentation filed: Charges VN

## 2015-03-03 ENCOUNTER — Encounter (HOSPITAL_COMMUNITY): Payer: Self-pay | Admitting: Nurse Practitioner

## 2015-03-03 ENCOUNTER — Ambulatory Visit (HOSPITAL_COMMUNITY)
Admission: RE | Admit: 2015-03-03 | Discharge: 2015-03-03 | Disposition: A | Discharge status: Home / Self Care | Payer: Medicare Other | Source: Ambulatory Visit | Attending: Nurse Practitioner | Admitting: Nurse Practitioner

## 2015-03-03 VITALS — BP 130/86 | HR 72 | Ht 70.5 in | Wt 267.8 lb

## 2015-03-03 DIAGNOSIS — Z79899 Other long term (current) drug therapy: Secondary | ICD-10-CM | POA: Diagnosis not present

## 2015-03-03 DIAGNOSIS — I481 Persistent atrial fibrillation: Secondary | ICD-10-CM | POA: Diagnosis not present

## 2015-03-03 DIAGNOSIS — Z6837 Body mass index (BMI) 37.0-37.9, adult: Secondary | ICD-10-CM | POA: Insufficient documentation

## 2015-03-03 DIAGNOSIS — I4892 Unspecified atrial flutter: Secondary | ICD-10-CM | POA: Diagnosis not present

## 2015-03-03 DIAGNOSIS — Z880 Allergy status to penicillin: Secondary | ICD-10-CM | POA: Diagnosis not present

## 2015-03-03 DIAGNOSIS — E669 Obesity, unspecified: Secondary | ICD-10-CM | POA: Diagnosis not present

## 2015-03-03 DIAGNOSIS — R9431 Abnormal electrocardiogram [ECG] [EKG]: Secondary | ICD-10-CM | POA: Diagnosis not present

## 2015-03-03 DIAGNOSIS — I10 Essential (primary) hypertension: Secondary | ICD-10-CM | POA: Insufficient documentation

## 2015-03-03 DIAGNOSIS — G4730 Sleep apnea, unspecified: Secondary | ICD-10-CM | POA: Insufficient documentation

## 2015-03-03 DIAGNOSIS — Z7901 Long term (current) use of anticoagulants: Secondary | ICD-10-CM | POA: Insufficient documentation

## 2015-03-03 DIAGNOSIS — Z882 Allergy status to sulfonamides status: Secondary | ICD-10-CM | POA: Insufficient documentation

## 2015-03-03 DIAGNOSIS — I4819 Other persistent atrial fibrillation: Secondary | ICD-10-CM

## 2015-03-03 LAB — BASIC METABOLIC PANEL
Anion gap: 6 (ref 5–15)
BUN: 17 mg/dL (ref 6–23)
CO2: 29 mmol/L (ref 19–32)
Calcium: 9.2 mg/dL (ref 8.4–10.5)
Chloride: 103 mmol/L (ref 96–112)
Creatinine, Ser: 1.17 mg/dL (ref 0.50–1.35)
GFR calc Af Amer: 64 mL/min — ABNORMAL LOW (ref >90)
GFR, EST NON AFRICAN AMERICAN: 55 mL/min — AB (ref >90)
Glucose, Bld: 92 mg/dL (ref 70–99)
POTASSIUM: 4.5 mmol/L (ref 3.5–5.1)
Sodium: 138 mmol/L (ref 135–145)

## 2015-03-03 LAB — PROTIME-INR
INR: 1.73 — AB (ref 0.00–1.49)
Prothrombin Time: 20.4 seconds — ABNORMAL HIGH (ref 11.6–15.2)

## 2015-03-03 LAB — MAGNESIUM: Magnesium: 2.2 mg/dL (ref 1.5–2.5)

## 2015-03-03 NOTE — Progress Notes (Addendum)
Patient ID: Chad [name], male   DOB: 28-Aug-1929, 81 y.o.   MRN: [id number]  Cardiololgist: Dr. Percival Spanish   HPI Pt being seen in the afib clinic f/u for rapid  heart beat x 4-6 weeks. Usually with rapid v rates around 120-130. BB recently increased without much improvement. Symptomatic with fatigue and exertional dyspnea but does not appear to be in any distress. Had been on amiodarone for many years after having DCCV on vacation one year. Amiodarone was stopped last year in March for neuropathy. Denies alcohol/excessive caffeine/snoring history.   EKG on last visit revealed a.flutter with RVR per Dr. Rayann Heman and per his recommendation he was started on flecainide. Continues on warfarin, last checked 4/6 and therapeutic at 2.6. Has had leg swelling with cardizem in the past.  Brought back today, starting flecainide over the week-end, with EKG showing a.flutter, rate controlled 72 bpm.. He feels better but still with fatigue. Discussed pursuing DCCV, but pt is reluctant to pursue at this time. He would like to give the drug more time to see if he spontaneously converts, and if not will pursue cardioversion.  Allergies  Allergen Reactions  . Carbapenems   . Cephalosporins   . Ciprofloxacin   . Decongestant [Oxymetazoline]   . Flagyl [Metronidazole] Nausea Only  . Hctz [Hydrochlorothiazide]   . Levaquin [Levofloxacin In D5w]   . Lidocaine   . Penicillins   . Sulfonamide Derivatives   . Teline [Tetracycline]   . Tetanus Toxoid   . Z-Pak [Azithromycin]     Current Outpatient Prescriptions  Medication Sig Dispense Refill  . Calcium-Magnesium 500-250 MG TABS Take 1 tablet by mouth daily.      . Cholecalciferol 1000 UNIT capsule Take 1,000 Units by mouth 2 (two) times daily.      . Coenzyme Q10 (EQL COQ10) 100 MG capsule Take 200 mg by mouth daily.     Marland Kitchen COUMADIN 5 MG tablet Take 1/2 tablet daily except 1 tablet on Mondays and Thursdays 30 tablet 3  . Cyanocobalamin (VITAMIN B 12 PO)  Take 100 mcg by mouth 2 (two) times daily.     Marland Kitchen escitalopram (LEXAPRO) 10 MG tablet Take 10 mg by mouth daily.      . flecainide (TAMBOCOR) 50 MG tablet Take 1 tablet (50 mg total) by mouth 2 (two) times daily. 60 tablet 1  . irbesartan (AVAPRO) 300 MG tablet Take 1 tablet (300 mg total) by mouth daily. 90 tablet 3  . metoprolol succinate (TOPROL-XL) 50 MG 24 hr tablet Take 1 tablet (50 mg total) by mouth daily. Take with or immediately following a meal. (Patient taking differently: Take 75 mg by mouth daily. Take with or immediately following a meal.) 90 tablet 3  . Multiple Vitamins-Minerals (CENTRUM SILVER PO) Take 1 tablet by mouth daily.       No current facility-administered medications for this encounter.    Past Medical History  Diagnosis Date  . Arrhythmia     atrial fib  . Hypertension   . Hypoglycemia   . 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 Tinnitus of both ears 09/25/2013  . Nystagmus, end-position 09/25/2013  . Sleep apnea     CPAP    Past Surgical History  Procedure Laterality Date  . Lumbar disc surgery  1991    ROS:   As stated in the HPI and negative for all other systems.  PHYSICAL EXAM BP 130/86 mmHg  Pulse 72  Ht 5' 10.5" (1.791 m)  Wt 267 lb  12.8 oz (121.473 kg)  BMI 37.87 kg/m2 GENERAL:  Well appearing HEENT:  PERRL NECK:  No jugular venous distention, waveform within normal limits, carotid upstroke brisk and symmetric, no bruits, no thyromegaly LUNGS:  Clear to auscultation bilaterally HEART: Rapid, irregular rhythm, no murmurs. urABD:  Flat, positive bowel sounds normal in frequency in pitch, no bruits, no rebound, no guarding, no midline pulsatile mass, no hepatomegaly, no splenomegaly EXT:  2 plus pulses throughout, left greater than right lower extremity edema edema, no cyanosis no clubbing NEURO:  Nonfocal SKIN:  No rashes or nodules.   EKG:  Aflutter rate controlled at 72 bpm, QRS 96 ms, QTc 413 bpm.   ASSESSMENT AND PLAN  1.Atrial Flutter/ h/o  a.fibrillation Continue flecainide 50 mg bid for now. He is slower but has not converted. Pt wants to give drug  more time to see if he chemically converts. He is a little hesitate to pursue DCCV at this time. Continue metoprolol. Pt denies h/o coronary disease, nml LV function other than mild LVH. Will need stress test if stays on flecainide.  2.HTN:   Stable  3.Obestiy Weight loss strongly encouraged.  4. Continue warfarin  5. Sleep Apnea Continue cpap.  Bmet, CBC, mag, INR today  Appointment made  with Dr. Percival Spanish next Friday for further evaluation.

## 2015-03-03 NOTE — Patient Instructions (Signed)
Appointment with Dr. Percival Spanish April 29th @ 12pm

## 2015-03-04 NOTE — Addendum Note (Signed)
Encounter addended by: Yehuda Mao on: 03/04/2015  9:05 AM<BR>     Documentation filed: Charges VN

## 2015-03-04 NOTE — Addendum Note (Signed)
Encounter addended by: Sherran Needs, NP on: 03/04/2015  9:12 AM<BR>     Documentation filed: Notes Section

## 2015-03-13 ENCOUNTER — Encounter: Payer: Self-pay | Admitting: Cardiology

## 2015-03-13 ENCOUNTER — Ambulatory Visit (INDEPENDENT_AMBULATORY_CARE_PROVIDER_SITE_OTHER): Payer: Medicare Other | Admitting: Pharmacist Clinician (PhC)/ Clinical Pharmacy Specialist

## 2015-03-13 ENCOUNTER — Ambulatory Visit (INDEPENDENT_AMBULATORY_CARE_PROVIDER_SITE_OTHER): Payer: Medicare Other | Admitting: Cardiology

## 2015-03-13 VITALS — BP 156/100 | HR 69 | Ht 70.0 in | Wt 265.0 lb

## 2015-03-13 DIAGNOSIS — I483 Typical atrial flutter: Secondary | ICD-10-CM

## 2015-03-13 DIAGNOSIS — I4891 Unspecified atrial fibrillation: Secondary | ICD-10-CM

## 2015-03-13 DIAGNOSIS — I481 Persistent atrial fibrillation: Secondary | ICD-10-CM

## 2015-03-13 DIAGNOSIS — Z79899 Other long term (current) drug therapy: Secondary | ICD-10-CM

## 2015-03-13 DIAGNOSIS — Z7901 Long term (current) use of anticoagulants: Secondary | ICD-10-CM | POA: Diagnosis not present

## 2015-03-13 DIAGNOSIS — I4892 Unspecified atrial flutter: Secondary | ICD-10-CM | POA: Insufficient documentation

## 2015-03-13 DIAGNOSIS — Z5181 Encounter for therapeutic drug level monitoring: Secondary | ICD-10-CM

## 2015-03-13 DIAGNOSIS — Z01818 Encounter for other preprocedural examination: Secondary | ICD-10-CM | POA: Diagnosis not present

## 2015-03-13 DIAGNOSIS — I4819 Other persistent atrial fibrillation: Secondary | ICD-10-CM

## 2015-03-13 LAB — POCT INR: INR: 1.9

## 2015-03-13 NOTE — Patient Instructions (Addendum)
TAKE AN EXTRA 1/2 Tablet of Warfarin today then resume your previous dose plan  We will get you set up for a Cardioversion next week with Dr.Hochrein  Your physician recommends that you return for lab work the day before your procedure

## 2015-03-13 NOTE — Progress Notes (Signed)
HPI The patient presents for followup of atrial fibrillation. He has been taken off of his amiodarone because of neuropathy although is not clear that this was related. He has had some palpitations but these are relatively infrequent. However, he has had increasing lower extremities swelling. This was both legs and then he was told to reduce his Cardizem to once daily. He now has less swelling in the right leg but still continues with some in the left. He's had no change in his weight. He's had no new shortness of breath, PND or orthopnea.  He denies any chest pressure, neck or arm discomfort.  Allergies  Allergen Reactions  . Carbapenems   . Cephalosporins   . Ciprofloxacin   . Decongestant [Oxymetazoline]   . Flagyl [Metronidazole] Nausea Only  . Hctz [Hydrochlorothiazide]   . Levaquin [Levofloxacin In D5w]   . Lidocaine   . Penicillins   . Sulfonamide Derivatives   . Teline [Tetracycline]   . Tetanus Toxoid   . Z-Pak [Azithromycin]     Current Outpatient Prescriptions  Medication Sig Dispense Refill  . Calcium-Magnesium 500-250 MG TABS Take 1 tablet by mouth daily.      . Cholecalciferol 1000 UNIT capsule Take 1,000 Units by mouth 2 (two) times daily.      . Coenzyme Q10 (EQL COQ10) 100 MG capsule Take 200 mg by mouth daily.     Marland Kitchen COUMADIN 5 MG tablet Take 1/2 tablet daily except 1 tablet on Mondays and Thursdays 30 tablet 3  . Cyanocobalamin (VITAMIN B 12 PO) Take 100 mcg by mouth 2 (two) times daily.     Marland Kitchen escitalopram (LEXAPRO) 10 MG tablet Take 10 mg by mouth daily.      . flecainide (TAMBOCOR) 50 MG tablet Take 50 mg by mouth 2 (two) times daily. 25mg  each morning and 50mg  each evening    . irbesartan (AVAPRO) 300 MG tablet Take 1 tablet (300 mg total) by mouth daily. 90 tablet 3  . metoprolol succinate (TOPROL-XL) 50 MG 24 hr tablet Take 1 tablet (50 mg total) by mouth daily. Take with or immediately following a meal. (Patient taking differently: Take 75 mg by mouth  daily. Take with or immediately following a meal.) 90 tablet 3  . Multiple Vitamins-Minerals (CENTRUM SILVER PO) Take 1 tablet by mouth daily.       No current facility-administered medications for this visit.    Past Medical History  Diagnosis Date  . Arrhythmia     atrial fib  . Hypertension   . Hypoglycemia   . 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 Tinnitus of both ears 09/25/2013  . Nystagmus, end-position 09/25/2013  . Sleep apnea     CPAP    Past Surgical History  Procedure Laterality Date  . Lumbar disc surgery  1991    ROS:   As stated in the HPI and negative for all other systems.  PHYSICAL EXAM BP 156/100 mmHg  Pulse 69  Ht 5\' 10"  (1.778 m)  Wt 265 lb (120.203 kg)  BMI 38.02 kg/m2 GENERAL:  Well appearing HEENT:  PERRL NECK:  No jugular venous distention, waveform within normal limits, carotid upstroke brisk and symmetric, no bruits, no thyromegaly LUNGS:  Clear to auscultation bilaterally HEART:  PMI not displaced or sustained,S1 and S2 within normal limits, no S3, no S4, no clicks, no rubs, no murmurs ABD:  Flat, positive bowel sounds normal in frequency in pitch, no bruits, no rebound, no guarding, no midline pulsatile mass, no hepatomegaly,  no splenomegaly EXT:  2 plus pulses throughout, left greater than right lower extremity edema edema, no cyanosis no clubbing NEURO:  Nonfocal SKIN:  No rashes or nodules.   EKG:  Atrial flutter, variable conduction, axis within normal limits, intervals within normal limits, no acute ST-T wave changes. His first. 03/13/2015   ASSESSMENT AND PLAN  ATRIAL FIBRILLATION:  He has been seen in the Atrial Fib clinic.  He is in flutter on flecainide.  He has fatigue and increased dyspnea that could be related to the flutter.  I am going to do a DC cardioversion. His INRs have been therapeutic except for a a reading of 1.9 today. He'll take an extra dose of warfarin.  I plan on bringing him next week for cardioversion. For now he will  remain on the meds as listed.  HTN:  The blood pressure is elevated but this is quite unusual. He will keep an eye on this.. No change in medications is indicated. We will continue with therapeutic lifestyle changes (TLC).  OVERWEIGHT:  The patient understands the need to lose weight with diet and exercise. We have again discussed specific strategies for this in the past.

## 2015-03-16 ENCOUNTER — Telehealth: Payer: Self-pay | Admitting: Cardiology

## 2015-03-16 ENCOUNTER — Encounter: Payer: Self-pay | Admitting: Cardiology

## 2015-03-16 DIAGNOSIS — I4819 Other persistent atrial fibrillation: Secondary | ICD-10-CM

## 2015-03-16 NOTE — Telephone Encounter (Signed)
Returned call to patient's wife she stated husband needed lab orders faxed to [facility].Stated he will be having a cardioversion.Lab order for cbc,bmet,pt faxed to [facility] at fax # [phone].

## 2015-03-16 NOTE — Telephone Encounter (Signed)
Spoke with patient regarding cardioversion ordered by Dr. Percival Spanish.  Scheduled for 03/18/15 at 11:00 am  Arrive at 10:00 am at Short Stay center at [facility]---NPO after midnight---morning meds are ok with a sip of water.  Patient to get lab work Tuesday 03/17/15.  Patient voiced his understanding.

## 2015-03-16 NOTE — Telephone Encounter (Signed)
Pt's wife called in stating that the pt's lab orders need to be faxed to [facility] for tomorrow. The fax number is [phone]  Thanks

## 2015-03-17 ENCOUNTER — Other Ambulatory Visit (HOSPITAL_COMMUNITY)
Admission: RE | Admit: 2015-03-17 | Discharge: 2015-03-17 | Disposition: A | Discharge status: Home / Self Care | Payer: Medicare Other | Source: Ambulatory Visit | Attending: Cardiology | Admitting: Cardiology

## 2015-03-17 ENCOUNTER — Other Ambulatory Visit: Payer: Self-pay | Admitting: Cardiology

## 2015-03-17 DIAGNOSIS — Z7901 Long term (current) use of anticoagulants: Secondary | ICD-10-CM | POA: Insufficient documentation

## 2015-03-17 DIAGNOSIS — I4819 Other persistent atrial fibrillation: Secondary | ICD-10-CM

## 2015-03-17 LAB — BASIC METABOLIC PANEL
Anion gap: 7 (ref 5–15)
BUN: 14 mg/dL (ref 6–20)
CALCIUM: 9.3 mg/dL (ref 8.9–10.3)
CO2: 31 mmol/L (ref 22–32)
Chloride: 101 mmol/L (ref 101–111)
Creatinine, Ser: 1.17 mg/dL (ref 0.61–1.24)
GFR calc Af Amer: >60 mL/min (ref >60)
GFR calc non Af Amer: 55 mL/min — ABNORMAL LOW (ref >60)
GLUCOSE: 117 mg/dL — AB (ref 70–99)
Potassium: 4.2 mmol/L (ref 3.5–5.1)
SODIUM: 139 mmol/L (ref 135–145)

## 2015-03-17 LAB — CBC WITH DIFFERENTIAL/PLATELET
Basophils Absolute: 0 10*3/uL (ref 0.0–0.1)
Basophils Relative: 0 % (ref 0–1)
Eosinophils Absolute: 0.2 10*3/uL (ref 0.0–0.7)
Eosinophils Relative: 3 % (ref 0–5)
HEMATOCRIT: 49.1 % (ref 39.0–52.0)
Hemoglobin: 16.3 g/dL (ref 13.0–17.0)
LYMPHS PCT: 23 % (ref 12–46)
Lymphs Abs: 1.8 10*3/uL (ref 0.7–4.0)
MCH: 30.8 pg (ref 26.0–34.0)
MCHC: 33.2 g/dL (ref 30.0–36.0)
MCV: 92.6 fL (ref 78.0–100.0)
Monocytes Absolute: 0.7 10*3/uL (ref 0.1–1.0)
Monocytes Relative: 10 % (ref 3–12)
NEUTROS ABS: 5 10*3/uL (ref 1.7–7.7)
NEUTROS PCT: 64 % (ref 43–77)
Platelets: 211 10*3/uL (ref 150–400)
RBC: 5.3 MIL/uL (ref 4.22–5.81)
RDW: 14.3 % (ref 11.5–15.5)
WBC: 7.7 10*3/uL (ref 4.0–10.5)

## 2015-03-17 LAB — PROTIME-INR
INR: 2.06 — AB (ref 0.00–1.49)
PROTHROMBIN TIME: 23.4 s — AB (ref 11.6–15.2)

## 2015-03-18 ENCOUNTER — Encounter (HOSPITAL_COMMUNITY): Admission: RE | Payer: Self-pay | Source: Ambulatory Visit

## 2015-03-18 ENCOUNTER — Ambulatory Visit (HOSPITAL_COMMUNITY): Admission: RE | Admit: 2015-03-18 | Payer: Medicare Other | Source: Ambulatory Visit | Admitting: Cardiology

## 2015-03-18 SURGERY — CARDIOVERSION
Anesthesia: Monitor Anesthesia Care

## 2015-03-19 ENCOUNTER — Telehealth: Payer: Self-pay | Admitting: *Deleted

## 2015-03-19 NOTE — Telephone Encounter (Signed)
Called pt to inform him Dr Percival Spanish needs 3 wks of therapeutic INR's to reschedule DCCV.  Pt in agreement.  INR appt made for 03/23/15  Coumadin dose increased.  He will take 5mg  tonight then 2.5mg  daily except 5mg  on Mondays, Wednesdays and Fridays.  He verbalized understanding.

## 2015-03-23 ENCOUNTER — Ambulatory Visit (INDEPENDENT_AMBULATORY_CARE_PROVIDER_SITE_OTHER): Payer: Medicare Other | Admitting: *Deleted

## 2015-03-23 DIAGNOSIS — I4891 Unspecified atrial fibrillation: Secondary | ICD-10-CM

## 2015-03-23 DIAGNOSIS — Z7901 Long term (current) use of anticoagulants: Secondary | ICD-10-CM | POA: Diagnosis not present

## 2015-03-23 DIAGNOSIS — Z5181 Encounter for therapeutic drug level monitoring: Secondary | ICD-10-CM

## 2015-03-23 LAB — POCT INR: INR: 2.3

## 2015-03-30 ENCOUNTER — Ambulatory Visit (INDEPENDENT_AMBULATORY_CARE_PROVIDER_SITE_OTHER): Payer: Medicare Other | Admitting: *Deleted

## 2015-03-30 DIAGNOSIS — I4891 Unspecified atrial fibrillation: Secondary | ICD-10-CM | POA: Diagnosis not present

## 2015-03-30 DIAGNOSIS — Z5181 Encounter for therapeutic drug level monitoring: Secondary | ICD-10-CM | POA: Diagnosis not present

## 2015-03-30 DIAGNOSIS — I480 Paroxysmal atrial fibrillation: Secondary | ICD-10-CM

## 2015-03-30 DIAGNOSIS — Z7901 Long term (current) use of anticoagulants: Secondary | ICD-10-CM | POA: Diagnosis not present

## 2015-03-30 LAB — POCT INR: INR: 2.3

## 2015-04-06 ENCOUNTER — Ambulatory Visit (INDEPENDENT_AMBULATORY_CARE_PROVIDER_SITE_OTHER): Payer: Medicare Other | Admitting: *Deleted

## 2015-04-06 DIAGNOSIS — I4891 Unspecified atrial fibrillation: Secondary | ICD-10-CM

## 2015-04-06 DIAGNOSIS — Z7901 Long term (current) use of anticoagulants: Secondary | ICD-10-CM

## 2015-04-06 DIAGNOSIS — Z5181 Encounter for therapeutic drug level monitoring: Secondary | ICD-10-CM

## 2015-04-06 LAB — POCT INR: INR: 2

## 2015-04-07 ENCOUNTER — Encounter: Payer: Self-pay | Admitting: Internal Medicine

## 2015-04-07 ENCOUNTER — Other Ambulatory Visit: Payer: Self-pay | Admitting: Dermatology

## 2015-04-07 ENCOUNTER — Encounter: Payer: Self-pay | Admitting: Cardiology

## 2015-04-09 ENCOUNTER — Other Ambulatory Visit: Payer: Self-pay | Admitting: *Deleted

## 2015-04-09 DIAGNOSIS — D689 Coagulation defect, unspecified: Secondary | ICD-10-CM

## 2015-04-09 DIAGNOSIS — R531 Weakness: Secondary | ICD-10-CM

## 2015-04-09 DIAGNOSIS — Z01812 Encounter for preprocedural laboratory examination: Secondary | ICD-10-CM

## 2015-04-15 ENCOUNTER — Ambulatory Visit (INDEPENDENT_AMBULATORY_CARE_PROVIDER_SITE_OTHER): Payer: Medicare Other | Admitting: *Deleted

## 2015-04-15 DIAGNOSIS — I4891 Unspecified atrial fibrillation: Secondary | ICD-10-CM | POA: Diagnosis not present

## 2015-04-15 DIAGNOSIS — Z7901 Long term (current) use of anticoagulants: Secondary | ICD-10-CM

## 2015-04-15 DIAGNOSIS — Z5181 Encounter for therapeutic drug level monitoring: Secondary | ICD-10-CM | POA: Diagnosis not present

## 2015-04-15 LAB — POCT INR: INR: 3.1

## 2015-04-17 LAB — PROTIME-INR
INR: 2.4 — ABNORMAL HIGH (ref <1.50)
Prothrombin Time: 26.2 seconds — ABNORMAL HIGH (ref 11.6–15.2)

## 2015-04-18 LAB — BASIC METABOLIC PANEL
BUN: 20 mg/dL (ref 6–23)
CO2: 27 meq/L (ref 19–32)
CREATININE: 1.02 mg/dL (ref 0.50–1.35)
Calcium: 8.5 mg/dL (ref 8.4–10.5)
Chloride: 104 mEq/L (ref 96–112)
GLUCOSE: 101 mg/dL — AB (ref 70–99)
POTASSIUM: 4.7 meq/L (ref 3.5–5.3)
Sodium: 140 mEq/L (ref 135–145)

## 2015-04-18 LAB — TSH: TSH: 2.062 u[IU]/mL (ref 0.350–4.500)

## 2015-04-22 ENCOUNTER — Ambulatory Visit (INDEPENDENT_AMBULATORY_CARE_PROVIDER_SITE_OTHER): Payer: Medicare Other | Admitting: *Deleted

## 2015-04-22 DIAGNOSIS — Z7901 Long term (current) use of anticoagulants: Secondary | ICD-10-CM | POA: Diagnosis not present

## 2015-04-22 DIAGNOSIS — Z5181 Encounter for therapeutic drug level monitoring: Secondary | ICD-10-CM | POA: Diagnosis not present

## 2015-04-22 DIAGNOSIS — I4891 Unspecified atrial fibrillation: Secondary | ICD-10-CM | POA: Diagnosis not present

## 2015-04-22 LAB — POCT INR: INR: 2.8

## 2015-04-23 ENCOUNTER — Ambulatory Visit (HOSPITAL_COMMUNITY)
Admission: RE | Admit: 2015-04-23 | Discharge: 2015-04-23 | Disposition: A | Discharge status: Home / Self Care | Payer: Medicare Other | Source: Ambulatory Visit | Attending: Internal Medicine | Admitting: Internal Medicine

## 2015-04-23 ENCOUNTER — Encounter (HOSPITAL_COMMUNITY): Payer: Self-pay | Admitting: *Deleted

## 2015-04-23 ENCOUNTER — Encounter (HOSPITAL_COMMUNITY)
Admission: RE | Disposition: A | Discharge status: Home / Self Care | Payer: Self-pay | Source: Ambulatory Visit | Attending: Internal Medicine

## 2015-04-23 ENCOUNTER — Ambulatory Visit (HOSPITAL_COMMUNITY): Payer: Medicare Other | Admitting: Anesthesiology

## 2015-04-23 DIAGNOSIS — I4891 Unspecified atrial fibrillation: Secondary | ICD-10-CM | POA: Insufficient documentation

## 2015-04-23 DIAGNOSIS — G4730 Sleep apnea, unspecified: Secondary | ICD-10-CM | POA: Diagnosis not present

## 2015-04-23 DIAGNOSIS — Z87891 Personal history of nicotine dependence: Secondary | ICD-10-CM | POA: Diagnosis not present

## 2015-04-23 DIAGNOSIS — I10 Essential (primary) hypertension: Secondary | ICD-10-CM | POA: Diagnosis not present

## 2015-04-23 DIAGNOSIS — I4892 Unspecified atrial flutter: Secondary | ICD-10-CM

## 2015-04-23 DIAGNOSIS — Z7901 Long term (current) use of anticoagulants: Secondary | ICD-10-CM | POA: Insufficient documentation

## 2015-04-23 HISTORY — PX: CARDIOVERSION: SHX1299

## 2015-04-23 SURGERY — CARDIOVERSION
Anesthesia: General

## 2015-04-23 MED ORDER — PROPOFOL 10 MG/ML IV BOLUS
INTRAVENOUS | Status: DC | PRN
  Administered 2015-04-23: 70 mg via INTRAVENOUS

## 2015-04-23 MED ORDER — SODIUM CHLORIDE 0.9 % IV SOLN
INTRAVENOUS | Status: DC
  Administered 2015-04-23: 12:00:00 via INTRAVENOUS
  Administered 2015-04-23: 500 mL via INTRAVENOUS

## 2015-04-23 NOTE — Discharge Instructions (Signed)

## 2015-04-23 NOTE — Anesthesia Preprocedure Evaluation (Addendum)
Anesthesia Evaluation  Patient identified by MRN, date of birth, ID band Patient awake    Reviewed: Allergy & Precautions, NPO status , Patient's Chart, lab work & pertinent test results, reviewed documented beta blocker date and time   Airway Mallampati: III  TM Distance: >3 FB Neck ROM: Full    Dental   Pulmonary sleep apnea and Continuous Positive Airway Pressure Ventilation , former smoker,  breath sounds clear to auscultation        Cardiovascular hypertension, Pt. on medications and Pt. on home beta blockers + dysrhythmias Atrial Fibrillation Rhythm:Regular Rate:Normal     Neuro/Psych negative neurological ROS     GI/Hepatic negative GI ROS, Neg liver ROS,   Endo/Other  Morbid obesity  Renal/GU negative Renal ROS     Musculoskeletal   Abdominal   Peds  Hematology negative hematology ROS (+)   Anesthesia Other Findings   Reproductive/Obstetrics                            Anesthesia Physical Anesthesia Plan  ASA: III  Anesthesia Plan: General   Post-op Pain Management:    Induction: Intravenous  Airway Management Planned: Mask and Natural Airway  Additional Equipment:   Intra-op Plan:   Post-operative Plan:   Informed Consent: I have reviewed the patients History and Physical, chart, labs and discussed the procedure including the risks, benefits and alternatives for the proposed anesthesia with the patient or authorized representative who has indicated his/her understanding and acceptance.   Dental advisory given  Plan Discussed with: CRNA  Anesthesia Plan Comments:        Anesthesia Quick Evaluation

## 2015-04-23 NOTE — Transfer of Care (Signed)
Immediate Anesthesia Transfer of Care Note  Patient: Chad [name]  Procedure(s) Performed: Procedure(s): CARDIOVERSION (N/A)  Patient Location: PACU and Endoscopy Unit  Anesthesia Type:General  Level of Consciousness: patient cooperative and responds to stimulation  Airway & Oxygen Therapy: Patient Spontanous Breathing and Patient connected to nasal cannula oxygen  Post-op Assessment: Report given to RN and Post -op Vital signs reviewed and stable  Post vital signs: Reviewed and stable  Last Vitals:  Filed Vitals:   04/23/15 1034  BP: 172/103  Temp: 36.6 C  Resp: 13    Complications: No apparent anesthesia complications

## 2015-04-23 NOTE — H&P (Signed)
Primary Physician: Primary Cardiologist:  Hochrein   HPI: Chad [name] is an 81 yo with a history of atrial fib / atrial flutter   Seen by J Hochrein.   Presents for cardioversion.      Past Medical History  Diagnosis Date  . Arrhythmia     atrial fib  . Hypertension   . Hypoglycemia   . 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 Tinnitus of both ears 09/25/2013  . Nystagmus, end-position 09/25/2013  . Sleep apnea     CPAP    Medications Prior to Admission  Medication Sig Dispense Refill  . Calcium-Magnesium 500-250 MG TABS Take 1 tablet by mouth daily.      . Cholecalciferol 1000 UNIT capsule Take 1,000 Units by mouth 2 (two) times daily.      . Coenzyme Q10 (EQL COQ10) 100 MG capsule Take 200 mg by mouth daily.     Marland Kitchen COUMADIN 5 MG tablet Take 1/2 tablet daily except 1 tablet on Mondays and Thursdays (Patient taking differently: Take 2.5-5 mg by mouth See admin instructions. Take 2.5 tablet daily except 5mg  on Sun, Mon, Wed and Fri) 30 tablet 3  . Cyanocobalamin (VITAMIN B 12 PO) Take 100 mcg by mouth 2 (two) times daily.     Marland Kitchen escitalopram (LEXAPRO) 10 MG tablet Take 10 mg by mouth daily.      . flecainide (TAMBOCOR) 50 MG tablet Take 50 mg by mouth 2 (two) times daily.     . irbesartan (AVAPRO) 300 MG tablet Take 1 tablet (300 mg total) by mouth daily. 90 tablet 3  . metoprolol succinate (TOPROL-XL) 50 MG 24 hr tablet Take 1 tablet (50 mg total) by mouth daily. Take with or immediately following a meal. (Patient taking differently: Take 75 mg by mouth daily. 1/2 tablet in the morning and 1 tablet in the evening) 90 tablet 3  . Multiple Vitamins-Minerals (CENTRUM SILVER PO) Take 1 tablet by mouth daily.      Marland Kitchen nystatin-triamcinolone (MYCOLOG II) cream Apply 1 application topically 2 (two) times daily as needed.  1  . OXYGEN Inhale 2 L into the lungs at bedtime. Uses with CPAP machine    . triamcinolone cream (KENALOG) 0.1 % Apply 1 application topically 2 (two) times daily as needed.  2         Infusions: . sodium chloride 500 mL (04/23/15 1059)    Allergies  Allergen Reactions  . Carbapenems   . Cephalosporins   . Ciprofloxacin     Pt can not take with amiodarone   . Decongestant [Oxymetazoline]     All decongestant - makes prostate swell  . Diltiazem Swelling  . Flagyl [Metronidazole] Nausea Only  . Hctz [Hydrochlorothiazide]   . Levaquin [Levofloxacin In D5w]   . Lidocaine     Pt unsure of  . Penicillins Swelling  . Teline [Tetracycline] Swelling  . Tetanus Toxoid Swelling  . Z-Pak [Azithromycin]     Pt can not take with Amiodarone   . Sulfonamide Derivatives Rash    History   Social History  . Marital Status: Married    Spouse Name: [name]  . Number of Children: 2  . Years of Education: 16   Occupational History  .     Social History Main Topics  . Smoking status: Former Smoker    Types: Cigarettes    Quit date: 12/13/1970  . Smokeless tobacco: Never Used  . Alcohol Use: No  . Drug Use: No  . Sexual Activity: Not  on file   Other Topics Concern  . 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   Patient has two children.   Patient is retired.   Patient has a college education.   Patient is right-handed.   Patient does not drink any caffeine.    Family History  Problem Relation Age of Onset  . Heart failure Father     REVIEW OF SYSTEMS:  All systems reviewed  Negative to the above problem except as noted above.    PHYSICAL EXAM: Filed Vitals:   04/23/15 1034  BP: 172/103  Temp: 97.9 F (36.6 C)  Resp: 13    No intake or output data in the 24 hours ending 04/23/15 1211  General:  Well appearing. No respiratory difficulty HEENT: normal Neck: supple. no JVD. Carotids 2+ bilat; no bruits. No lymphadenopathy or thryomegaly appreciated. Cor: PMI nondisplaced. Regular rate & rhythm. No rubs, gallops or murmurs. Lungs: clear Abdomen: soft, nontender, nondistended. No  hepatosplenomegaly. No bruits or masses. Good bowel sounds. Extremities: no cyanosis, clubbing, rash, edema Neuro: alert & oriented x 3, cranial nerves grossly intact. moves all 4 extremities w/o difficulty. Affect pleasant.    Results for orders placed or performed in visit on 04/15/15 (from the past 24 hour(s))  POCT INR     Status: Normal   Collection Time: 04/22/15  3:37 PM  Result Value Ref Range   INR 2.8    No results found.   ASSESSMENT: Patient with atrial fib/flutter  Plan for cardioversion.   Continue meds after and follow

## 2015-04-23 NOTE — Anesthesia Postprocedure Evaluation (Signed)
  Anesthesia Post-op Note  Patient: Chad [name]  Procedure(s) Performed: Procedure(s): CARDIOVERSION (N/A)  Patient Location: PACU  Anesthesia Type:General  Level of Consciousness: awake and alert   Airway and Oxygen Therapy: Patient Spontanous Breathing  Post-op Pain: none  Post-op Assessment: Post-op Vital signs reviewed              Post-op Vital Signs: Reviewed  Last Vitals:  Filed Vitals:   04/23/15 1305  BP: 126/66  Pulse: 51  Temp:   Resp: 17    Complications: No apparent anesthesia complications

## 2015-04-23 NOTE — Op Note (Signed)
Patient anesthetized with propofol by anesthesia With pads in AP position, pt cardioverted to Sinus bradycardia with 150J synchronized biphasic energy. 12 lead EKG pending .

## 2015-04-24 ENCOUNTER — Telehealth: Payer: Self-pay | Admitting: Cardiology

## 2015-04-24 ENCOUNTER — Encounter (HOSPITAL_COMMUNITY): Payer: Self-pay | Admitting: Internal Medicine

## 2015-04-24 NOTE — Telephone Encounter (Signed)
Please call,pt had a Cardioversion yesterday. He have some questions about whether he needs to continue taking some of the medicine.

## 2015-04-24 NOTE — Telephone Encounter (Signed)
Tell him to reduce the metoprolol to 12.5 mg twice daily.  Needs to be seen in follow up in flex clinic next week and then see me in about 4 weeks.  Let us know if the heart rate continues to run low.

## 2015-04-24 NOTE — Telephone Encounter (Signed)
Spoke to patient  patient aware of medication change with Metoprolol  12.5 mg ( 1/4 tablet of 50 mg) Continue flecainide Contact if heart rate continues to run low.  Patient aware scheduler will call on Monday with appointment times.

## 2015-04-24 NOTE — Telephone Encounter (Signed)
Patient has a question concerning medications Cardioversion was done yesterday by Dr Harrington Challenger.  Patient states his heart today is rate 40-45 at rest and 60 with walking  He would like to know if he should continue  Metoprolol tart 25 mg in morning and 50 mg in the evening Flecainide 50 mg twice a day.  When should he return for follow up?  RN will contact patient,patient aware.

## 2015-04-27 ENCOUNTER — Other Ambulatory Visit (HOSPITAL_COMMUNITY): Payer: Self-pay | Admitting: Nurse Practitioner

## 2015-04-29 ENCOUNTER — Ambulatory Visit (INDEPENDENT_AMBULATORY_CARE_PROVIDER_SITE_OTHER): Payer: Medicare Other | Admitting: *Deleted

## 2015-04-29 DIAGNOSIS — Z5181 Encounter for therapeutic drug level monitoring: Secondary | ICD-10-CM | POA: Diagnosis not present

## 2015-04-29 DIAGNOSIS — I4891 Unspecified atrial fibrillation: Secondary | ICD-10-CM

## 2015-04-29 DIAGNOSIS — Z7901 Long term (current) use of anticoagulants: Secondary | ICD-10-CM | POA: Diagnosis not present

## 2015-04-29 LAB — POCT INR: INR: 3.1

## 2015-05-01 ENCOUNTER — Telehealth: Payer: Self-pay | Admitting: Cardiology

## 2015-05-01 NOTE — Telephone Encounter (Signed)
Pt had a cardioversion last Thursday,his rateit is now at 90 times a minutes. Please call to advise.

## 2015-05-01 NOTE — Telephone Encounter (Signed)
Pt states after cardioversion, HR between 80-100. Mostly around 90. He states no symptoms w/ this - advised if symptoms, HR faster than 100 bpm, OK to take extra 1/2 dose of metoprolol. If he feels poorly over the weekend, use on call provider line.  Pt verbalized understanding.  Pt also stated he thinks he has developed a rash since going on larger dose of metoprolol. Advised to continue to monitor site, use topical cream. If unimproved, call Monday for advice - can defer to cardiologist for possible alternatives.  Pt verbalized agreement w/ plan.

## 2015-05-04 ENCOUNTER — Telehealth: Payer: Self-pay | Admitting: Cardiology

## 2015-05-04 NOTE — Telephone Encounter (Signed)
Spoke with patient - he has called in a few times on triage after his cardioversion r/t his heart rate and medications have been adjusted. He was supposed to have flex clinic appointment week of June 13th and message was sent to [facility] Admin pool on 6/10 (along with appointment with Dr. Percival Spanish 4 weeks post cardioversion) and neither were scheduled. Patient reports his HR after cardioversion was in the 40s (his usual) but is faster now in the 80s and he feels like he is out of rhythm. He "feels pretty good" but is tired, denies SOB, complains of feet and ankle edema.   Patient is taking metoprolol 25mg  QAM and 50mg  QHS. He has not taken beta-blocker as needed for HR >100bpm as denoted from 6/17 triage call.   Patient is going out of town next week and needs eval of this issue, per Dr. Rosezella [place] note on 6/10   Jari Sportsman kindly scheduled patient for appointment 6/21 @ 945 with Gerrianne Scale, PA and patient is aware of appointment date/time/location.

## 2015-05-04 NOTE — Telephone Encounter (Signed)
Pt called in stating that he had a cardioversion done a couple of weeks ago and since then his heart is beating faster than normal( about 80bpm). He also mentioned that his BP is running high and that may be due to his medication changes last Friday . Please call  Thanks

## 2015-05-05 ENCOUNTER — Ambulatory Visit (INDEPENDENT_AMBULATORY_CARE_PROVIDER_SITE_OTHER): Payer: Medicare Other | Admitting: Physician Assistant

## 2015-05-05 ENCOUNTER — Encounter: Payer: Self-pay | Admitting: Physician Assistant

## 2015-05-05 VITALS — BP 140/80 | HR 76 | Ht 70.5 in | Wt 269.8 lb

## 2015-05-05 DIAGNOSIS — I5033 Acute on chronic diastolic (congestive) heart failure: Secondary | ICD-10-CM | POA: Diagnosis not present

## 2015-05-05 DIAGNOSIS — I10 Essential (primary) hypertension: Secondary | ICD-10-CM

## 2015-05-05 DIAGNOSIS — I4892 Unspecified atrial flutter: Secondary | ICD-10-CM

## 2015-05-05 MED ORDER — FUROSEMIDE 20 MG PO TABS: 20.0000 mg | ORAL_TABLET | Freq: Every day | ORAL | Status: DC

## 2015-05-05 NOTE — Assessment & Plan Note (Signed)
Blood pressure stable ? ?

## 2015-05-05 NOTE — Progress Notes (Signed)
Cardiology Office Note   Date:  05/05/2015   ID:  Chad [name], DOB November 01, 1929, MRN [id number]  PCP:  Purvis Kilts, MD  Cardiologist:  Dr. Percival Spanish  Chief Complaint:    History of Present Illness: Chad [name] is a 81 y.o. male who presents for post hospital follow-up after undergoing cardioversion. The patient was on flecainide and underwent successful cardioversion on 04/23/15. He said he stayed in normal rhythm for about 4 days and then went back into atrial flutter. He became bradycardic when in normal rhythm and his metoprolol was decreased to 12-1/2 mg twice a day. His heart rate increased when he went back and 8 atrial flutter and he is now taking Toprol 50 mg the morning 20 5 in the evening. He is developed a rash on his arms and shoulders since starting this can 9 and metoprolol. He went to his primary care and a dermatologist who could not diagnose it. He is also complaining of dyspnea on exertion with little activity and leg edema. He maintained normal sinus rhythm on amiodarone for 10 years but this was stopped due to neuropathy.    Past Medical History  Diagnosis Date  . Arrhythmia     atrial fib  . Hypertension   . Hypoglycemia   . 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 Tinnitus of both ears 09/25/2013  . Nystagmus, end-position 09/25/2013  . Sleep apnea     CPAP    Past Surgical History  Procedure Laterality Date  . Lumbar disc surgery  1991  . Cardioversion N/A 04/23/2015    Procedure: CARDIOVERSION;  Surgeon: [name], MD;  Location: [facility] ENDOSCOPY;  Service: Cardiovascular;  Laterality: N/A;     Current Outpatient Prescriptions  Medication Sig Dispense Refill  . Calcium-Magnesium 500-250 MG TABS Take 1 tablet by mouth daily.      . Cholecalciferol 1000 UNIT capsule Take 1,000 Units by mouth 2 (two) times daily.      . Coenzyme Q10 (EQL COQ10) 100 MG capsule Take 200 mg by mouth daily.     Marland Kitchen COUMADIN 5 MG tablet Take 1/2 tablet daily except 1 tablet on  Mondays and Thursdays (Patient taking differently: Take 2.5-5 mg by mouth See admin instructions. Take 2.5 tablet daily except 5mg  on Sun, Mon, Wed and Fri) 30 tablet 3  . Cyanocobalamin (VITAMIN B 12 PO) Take 100 mcg by mouth 2 (two) times daily.     Marland Kitchen escitalopram (LEXAPRO) 10 MG tablet Take 10 mg by mouth daily.      . flecainide (TAMBOCOR) 50 MG tablet Take 50 mg by mouth 2 (two) times daily.     . irbesartan (AVAPRO) 300 MG tablet Take 1 tablet (300 mg total) by mouth daily. 90 tablet 3  . metoprolol succinate (TOPROL-XL) 50 MG 24 hr tablet Take 1 tablet (50 mg total) by mouth daily. Take with or immediately following a meal. (Patient taking differently: Take 75 mg by mouth daily. 1/2 tablet in the morning and 1 tablet in the evening) 90 tablet 3  . Metoprolol Tartrate 75 MG TABS Take 75 mg by mouth 2 (two) times daily. Pt. Takes 50 mg in the morning and 25 mg at bedtime by mouth daily    . Multiple Vitamins-Minerals (CENTRUM SILVER PO) Take 1 tablet by mouth daily.      Marland Kitchen nystatin-triamcinolone (MYCOLOG II) cream Apply 1 application topically 2 (two) times daily as needed (rash).   1  . OXYGEN Inhale 2 L into the lungs  at bedtime. Uses with CPAP machine     No current facility-administered medications for this visit.    Allergies:   Carbapenems; Cephalosporins; Ciprofloxacin; Decongestant; Diltiazem; Flagyl; Hctz; Levaquin; Lidocaine; Penicillins; Teline; Tetanus toxoid; Z-pak; and Sulfonamide derivatives    Social History:  The patient  reports that he quit smoking about 44 years ago. His smoking use included Cigarettes. He has never used smokeless tobacco. He reports that he does not drink alcohol or use illicit drugs.   Family History:  The patient's   family history includes Dementia in his mother; Heart failure in his father; Hypertension in his father.    ROS:  Please see the history of present illness.   Otherwise, review of systems are positive for none.   All other systems are  reviewed and negative.    PHYSICAL EXAM: VS:  BP 140/80 mmHg  Pulse 76  Ht 5' 10.5" (1.791 m)  Wt 269 lb 12.8 oz (122.38 kg)  BMI 38.15 kg/m2 , BMI Body mass index is 38.15 kg/(m^2). GEN: Well nourished, well developed, in no acute distress Neck: no JVD, HJR, carotid bruits, or masses Cardiac: Irregular irregular at 76 bpm 2/6 systolic murmur at the left sternal border, no gallop, rubs, thrill or heave,  Respiratory:  clear to auscultation bilaterally, normal work of breathing GI: soft, nontender, nondistended, + BS MS: no deformity or atrophy Extremities: +1 edema bilaterally without cyanosis, clubbing,  good distal pulses bilaterally.  Skin: warm and dry, no rash Neuro:  Strength and sensation are intact    EKG:  EKG is ordered today. The ekg ordered today demonstrates atrial flutter at 76 bpm with incomplete right bundle branch block and nonspecific ST-T wave changes   Recent Labs: 03/03/2015: Magnesium 2.2 03/17/2015: Hemoglobin 16.3; Platelets 211 04/17/2015: BUN 20; Creat 1.02; Potassium 4.7; Sodium 140; TSH 2.062    Lipid Panel    Component Value Date/Time   CHOL 179 06/10/2010 1617   TRIG 75 06/10/2010 1617   HDL 46 06/10/2010 1617   LDLCALC 118 06/10/2010 1617      Wt Readings from Last 3 Encounters:  05/05/15 269 lb 12.8 oz (122.38 kg)  04/23/15 262 lb (118.842 kg)  03/13/15 265 lb (120.203 kg)      Other studies Reviewed: Additional studies/ records that were reviewed today include and review of the records demonstrates:   2-D echo January 2015Study Conclusions  - Left ventricle: The cavity size was normal. There was mild   concentric hypertrophy. Systolic function was normal. The   estimated ejection fraction was in the range of 60% to   65%. Although no diagnostic regional wall motion   abnormality was identified, this possibility cannot be   completely excluded on the basis of this study. Features   are consistent with a pseudonormal left  ventricular   filling pattern, with concomitant abnormal relaxation and   increased filling pressure (grade 2 diastolic   dysfunction). - Mitral valve: Calcified annulus. Mildly thickened leaflets   . - Left atrium: The atrium was moderately dilated. - Right atrium: The atrium was mildly dilated.  ASSESSMENT AND PLAN:  Atrial flutter Patient is back in atrial flutter after recent cardioversion on flecainide. He is complaining of dyspnea on exertion and has leg edema. He is symptomatic with atrial fibrillation and flutter. I discussed this patient with Dr. Percival Spanish who recommends stopping the flecainide. He will see him in 2 weeks to arrange for admission for Tikosyn. Patient will be traveling to [place] next week.  Essential hypertension Blood pressure stable  Acute on chronic diastolic heart failure Patient has diastolic heart failure when he is in atrial flutter. We'll add low-dose Lasix 20 mg daily to assist with this.    Sumner Boast, PA-C  05/05/2015 10:15 AM    [facility] [address], [place], [place]  [address] Phone: [phone]; Fax: [phone]

## 2015-05-05 NOTE — Assessment & Plan Note (Signed)
Patient is back in atrial flutter after recent cardioversion on flecainide. He is complaining of dyspnea on exertion and has leg edema. He is symptomatic with atrial fibrillation and flutter. I discussed this patient with Dr. Percival Spanish who recommends stopping the flecainide. He will see him in 2 weeks to arrange for admission for Tikosyn. Patient will be traveling to [place] next week.

## 2015-05-05 NOTE — Patient Instructions (Signed)
Medication Instructions:   STOP TAKING TAMBACOR  START TAKING LASIX 20 MG ONCE A DAY   Labwork:   Testing/Procedures:   Follow-Up:  WITH [facility] THE FIRST WEEK OF July PER [place] (PHONECALL: GET IN TOUCH WITH HIS NURSE TO FIT ON HIS SCHEDULE)    Low-Sodium Eating Plan Sodium raises blood pressure and causes water to be held in the body. Getting less sodium from food will help lower your blood pressure, reduce any swelling, and protect your heart, liver, and kidneys. We get sodium by adding salt (sodium chloride) to food. Most of our sodium comes from canned, boxed, and frozen foods. Restaurant foods, fast foods, and pizza are also very high in sodium. Even if you take medicine to lower your blood pressure or to reduce fluid in your body, getting less sodium from your food is important. WHAT IS MY PLAN? Most people should limit their sodium intake to 2,300 mg a day. Your health care provider recommends that you limit your sodium intake to __________ a day.  WHAT DO I NEED TO KNOW ABOUT THIS EATING PLAN? For the low-sodium eating plan, you will follow these general guidelines:  Choose foods with a % Daily Value for sodium of less than 5% (as listed on the food label).   Use salt-free seasonings or herbs instead of table salt or sea salt.   Check with your health care provider or pharmacist before using salt substitutes.   Eat fresh foods.  Eat more vegetables and fruits.  Limit canned vegetables. If you do use them, rinse them well to decrease the sodium.   Limit cheese to 1 oz (28 g) per day.   Eat lower-sodium products, often labeled as "lower sodium" or "no salt added."  Avoid foods that contain monosodium glutamate (MSG). MSG is sometimes added to [place] food and some canned foods.  Check food labels (Nutrition Facts labels) on foods to learn how much sodium is in one serving.  Eat more home-cooked food and less restaurant, buffet, and fast food.  When  eating at a restaurant, ask that your food be prepared with less salt or none, if possible.  HOW DO I READ FOOD LABELS FOR SODIUM INFORMATION? The Nutrition Facts label lists the amount of sodium in one serving of the food. If you eat more than one serving, you must multiply the listed amount of sodium by the number of servings. Food labels may also identify foods as:  Sodium free--Less than 5 mg in a serving.  Very low sodium--35 mg or less in a serving.  Low sodium--140 mg or less in a serving.  Light in sodium--50% less sodium in a serving. For example, if a food that usually has 300 mg of sodium is changed to become light in sodium, it will have 150 mg of sodium.  Reduced sodium--25% less sodium in a serving. For example, if a food that usually has 400 mg of sodium is changed to reduced sodium, it will have 300 mg of sodium. WHAT FOODS CAN I EAT? Grains Low-sodium cereals, including oats, puffed wheat and rice, and shredded wheat cereals. Low-sodium crackers. Unsalted rice and pasta. Lower-sodium bread.  Vegetables Frozen or fresh vegetables. Low-sodium or reduced-sodium canned vegetables. Low-sodium or reduced-sodium tomato sauce and paste. Low-sodium or reduced-sodium tomato and vegetable juices.  Fruits Fresh, frozen, and canned fruit. Fruit juice.  Meat and Other Protein Products Low-sodium canned tuna and salmon. Fresh or frozen meat, poultry, seafood, and fish. Lamb. Unsalted nuts. Dried beans, peas, and  lentils without added salt. Unsalted canned beans. Homemade soups without salt. Eggs.  Dairy Milk. Soy milk. Ricotta cheese. Low-sodium or reduced-sodium cheeses. Yogurt.  Condiments Fresh and dried herbs and spices. Salt-free seasonings. Onion and garlic powders. Low-sodium varieties of mustard and ketchup. Lemon juice.  Fats and Oils Reduced-sodium salad dressings. Unsalted butter.  Other Unsalted popcorn and pretzels.  The items listed above may not be a  complete list of recommended foods or beverages. Contact your dietitian for more options. WHAT FOODS ARE NOT RECOMMENDED? Grains Instant hot cereals. Bread stuffing, pancake, and biscuit mixes. Croutons. Seasoned rice or pasta mixes. Noodle soup cups. Boxed or frozen macaroni and cheese. Self-rising flour. Regular salted crackers. Vegetables Regular canned vegetables. Regular canned tomato sauce and paste. Regular tomato and vegetable juices. Frozen vegetables in sauces. Salted french fries. Olives. Angie Fava. Relishes. Sauerkraut. Salsa. Meat and Other Protein Products Salted, canned, smoked, spiced, or pickled meats, seafood, or fish. Bacon, ham, sausage, hot dogs, corned beef, chipped beef, and packaged luncheon meats. Salt pork. Jerky. Pickled herring. Anchovies, regular canned tuna, and sardines. Salted nuts. Dairy Processed cheese and cheese spreads. Cheese curds. Blue cheese and cottage cheese. Buttermilk.  Condiments Onion and garlic salt, seasoned salt, table salt, and sea salt. Canned and packaged gravies. Worcestershire sauce. Tartar sauce. Barbecue sauce. Teriyaki sauce. Soy sauce, including reduced sodium. Steak sauce. Fish sauce. Oyster sauce. Cocktail sauce. Horseradish. Regular ketchup and mustard. Meat flavorings and tenderizers. Bouillon cubes. Hot sauce. Tabasco sauce. Marinades. Taco seasonings. Relishes. Fats and Oils Regular salad dressings. Salted butter. Margarine. Ghee. Bacon fat.  Other Potato and tortilla chips. Corn chips and puffs. Salted popcorn and pretzels. Canned or dried soups. Pizza. Frozen entrees and pot pies.  The items listed above may not be a complete list of foods and beverages to avoid. Contact your dietitian for more information. Document Released: 04/22/2002 Document Revised: 11/05/2013 Document Reviewed: 09/04/2013 [facility] Patient Information 2015 [place], [place]. This information is not intended to replace advice given to you by your health  care provider. Make sure you discuss any questions you have with your health care provider.  Any Other Special Instructions Will Be Listed Below (If Applicable).

## 2015-05-05 NOTE — Assessment & Plan Note (Signed)
Patient has diastolic heart failure when he is in atrial flutter. We'll add low-dose Lasix 20 mg daily to assist with this.

## 2015-05-06 ENCOUNTER — Ambulatory Visit (INDEPENDENT_AMBULATORY_CARE_PROVIDER_SITE_OTHER): Payer: Medicare Other | Admitting: *Deleted

## 2015-05-06 DIAGNOSIS — Z5181 Encounter for therapeutic drug level monitoring: Secondary | ICD-10-CM

## 2015-05-06 DIAGNOSIS — I4891 Unspecified atrial fibrillation: Secondary | ICD-10-CM | POA: Diagnosis not present

## 2015-05-06 DIAGNOSIS — Z7901 Long term (current) use of anticoagulants: Secondary | ICD-10-CM

## 2015-05-06 LAB — POCT INR: INR: 3.2

## 2015-05-17 ENCOUNTER — Other Ambulatory Visit: Payer: Self-pay | Admitting: Cardiology

## 2015-05-20 ENCOUNTER — Ambulatory Visit (INDEPENDENT_AMBULATORY_CARE_PROVIDER_SITE_OTHER): Payer: Medicare Other | Admitting: *Deleted

## 2015-05-20 DIAGNOSIS — I4891 Unspecified atrial fibrillation: Secondary | ICD-10-CM

## 2015-05-20 DIAGNOSIS — Z7901 Long term (current) use of anticoagulants: Secondary | ICD-10-CM | POA: Diagnosis not present

## 2015-05-20 DIAGNOSIS — Z5181 Encounter for therapeutic drug level monitoring: Secondary | ICD-10-CM

## 2015-05-20 LAB — POCT INR: INR: 2

## 2015-05-25 ENCOUNTER — Encounter: Payer: Self-pay | Admitting: Cardiology

## 2015-05-25 ENCOUNTER — Ambulatory Visit (INDEPENDENT_AMBULATORY_CARE_PROVIDER_SITE_OTHER): Payer: Medicare Other | Admitting: Cardiology

## 2015-05-25 ENCOUNTER — Ambulatory Visit (INDEPENDENT_AMBULATORY_CARE_PROVIDER_SITE_OTHER): Payer: Medicare Other | Admitting: Pharmacist Clinician (PhC)/ Clinical Pharmacy Specialist

## 2015-05-25 VITALS — BP 116/64 | HR 89 | Ht 70.0 in | Wt 265.0 lb

## 2015-05-25 DIAGNOSIS — Z79899 Other long term (current) drug therapy: Secondary | ICD-10-CM | POA: Diagnosis not present

## 2015-05-25 DIAGNOSIS — I4819 Other persistent atrial fibrillation: Secondary | ICD-10-CM

## 2015-05-25 DIAGNOSIS — I481 Persistent atrial fibrillation: Secondary | ICD-10-CM

## 2015-05-25 DIAGNOSIS — Z5181 Encounter for therapeutic drug level monitoring: Secondary | ICD-10-CM | POA: Diagnosis not present

## 2015-05-25 LAB — BASIC METABOLIC PANEL
BUN: 15 mg/dL (ref 6–23)
CALCIUM: 9.3 mg/dL (ref 8.4–10.5)
CO2: 30 meq/L (ref 19–32)
Chloride: 103 mEq/L (ref 96–112)
Creat: 0.96 mg/dL (ref 0.50–1.35)
Glucose, Bld: 87 mg/dL (ref 70–99)
Potassium: 4.9 mEq/L (ref 3.5–5.3)
SODIUM: 141 meq/L (ref 135–145)

## 2015-05-25 LAB — POCT INR: INR: 2

## 2015-05-25 LAB — MAGNESIUM: MAGNESIUM: 2.2 mg/dL (ref 1.5–2.5)

## 2015-05-25 NOTE — Patient Instructions (Addendum)
Your physician recommends that you return for lab work TODAY  There is a lab on the 1st floor of this building - 1st long hallway on right off the elevator. Down that hall then take left. [address] on left.   Please call when you want to be admitted for Tikosyn loading at [place], [place]  INR July 11 = 2.0

## 2015-05-25 NOTE — Progress Notes (Signed)
HPI The patient presents for followup of atrial fibrillation. He has been taken off of his amiodarone because of neuropathy although is not clear that this was related.  He was seen in the atrial fibrillation clinic. He was started on flecainide and was cardioverted.  However, he did not maintain sinus rhythm. He notices when he's in fibrillation. His heart rate goes faster and he has to uptitrate his beta blocker. He doesn't feel as well he doesn't think when he's in fibrillation. He has less energy and decreased exercise tolerance and increased dyspnea. He also had a rash. He wondered whether related to a higher dose of metoprolol which he doesn't think he tolerates. He's not had any presyncope or syncope. He's not had any chest pressure, neck or arm discomfort. He has some mild chronic edema.  Of note he had a recent nosebleed while on vacation in [place]. However, his warfarin has been continued.  Allergies  Allergen Reactions  . Carbapenems   . Cephalosporins   . Ciprofloxacin     Pt can not take with amiodarone   . Decongestant [Oxymetazoline]     All decongestant - makes prostate swell  . Diltiazem Swelling  . Flagyl [Metronidazole] Nausea Only  . Hctz [Hydrochlorothiazide]   . Levaquin [Levofloxacin In D5w]   . Lidocaine     Pt unsure of  . Penicillins Swelling  . Teline [Tetracycline] Swelling  . Tetanus Toxoid Swelling  . Z-Pak [Azithromycin]     Pt can not take with Amiodarone   . Sulfonamide Derivatives Rash    Current Outpatient Prescriptions  Medication Sig Dispense Refill  . Calcium-Magnesium 500-250 MG TABS Take 1 tablet by mouth daily.      . Cholecalciferol 1000 UNIT capsule Take 1,000 Units by mouth 2 (two) times daily.      . Coenzyme Q10 (EQL COQ10) 100 MG capsule Take 200 mg by mouth daily.     Marland Kitchen COUMADIN 5 MG tablet Take 1 tablet by mouth daily or as directed by coumadin clinic (Patient taking differently: Take 5 mg by mouth. Take 5 mg Monday,  Wednesday, and Friday and 2.5 all other days.) 30 tablet 3  . Cyanocobalamin (VITAMIN B 12 PO) Take 500 mcg by mouth 2 (two) times daily.     Marland Kitchen escitalopram (LEXAPRO) 10 MG tablet Take 10 mg by mouth daily.      . furosemide (LASIX) 20 MG tablet Take 1 tablet (20 mg total) by mouth daily. 30 tablet 6  . irbesartan (AVAPRO) 300 MG tablet Take 1 tablet (300 mg total) by mouth daily. 90 tablet 3  . metoprolol succinate (TOPROL-XL) 50 MG 24 hr tablet Take 1 tablet (50 mg total) by mouth daily. Take with or immediately following a meal. (Patient taking differently: Take 75 mg by mouth daily. 1/2 tablet in the morning and 1 tablet in the evening) 90 tablet 3  . Multiple Vitamins-Minerals (CENTRUM SILVER PO) Take 1 tablet by mouth daily.      . OXYGEN Inhale 2 L into the lungs at bedtime. Uses with CPAP machine     No current facility-administered medications for this visit.    Past Medical History  Diagnosis Date  . Arrhythmia     atrial fib  . Hypertension   . Hypoglycemia   . 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 Tinnitus of both ears 09/25/2013  . Nystagmus, end-position 09/25/2013  . Sleep apnea     CPAP    Past Surgical History  Procedure  Laterality Date  . Lumbar disc surgery  1991  . Cardioversion N/A 04/23/2015    Procedure: CARDIOVERSION;  Surgeon: [name], MD;  Location: [facility] ENDOSCOPY;  Service: Cardiovascular;  Laterality: N/A;    ROS:   As stated in the HPI and negative for all other systems.  PHYSICAL EXAM BP 116/64 mmHg  Pulse 89  Ht 5\' 10"  (1.778 m)  Wt 265 lb (120.203 kg)  BMI 38.02 kg/m2 GENERAL:  Well appearing HEENT:  PERRL NECK:  No jugular venous distention, waveform within normal limits, carotid upstroke brisk and symmetric, no bruits, no thyromegaly LUNGS:  Clear to auscultation bilaterally HEART:  PMI not displaced or sustained,S1 and S2 within normal limits, no S3, no clicks, no rubs, no murmurs, irregular ABD:  Flat, positive bowel sounds normal in frequency  in pitch, no bruits, no rebound, no guarding, no midline pulsatile mass, no hepatomegaly, no splenomegaly EXT:  2 plus pulses throughout, left greater than right lower extremity edema edema, no cyanosis no clubbing NEURO:  Nonfocal SKIN:  No rashes or nodules.    ASSESSMENT AND PLAN  ATRIAL FIBRILLATION:  He will need to be hospitalized for Tikosyn.  He will let me know when his wife gets back from upcoming vacation. I will check a potassium and magnesium. He has been on therapeutic anticoagulation and remains on this.   HTN:   The blood pressure is at target. No change in medications is indicated. We will continue with therapeutic lifestyle changes (TLC).  OVERWEIGHT:  The patient understands the need to lose weight with diet and exercise.  I will send him to a dietician.

## 2015-06-01 ENCOUNTER — Ambulatory Visit (HOSPITAL_COMMUNITY)
Admission: RE | Admit: 2015-06-01 | Discharge: 2015-06-01 | Disposition: A | Discharge status: Home / Self Care | Payer: Medicare Other | Source: Ambulatory Visit | Attending: Family Medicine | Admitting: Family Medicine

## 2015-06-01 ENCOUNTER — Other Ambulatory Visit (HOSPITAL_COMMUNITY): Payer: Self-pay | Admitting: Family Medicine

## 2015-06-01 DIAGNOSIS — J209 Acute bronchitis, unspecified: Secondary | ICD-10-CM

## 2015-06-01 DIAGNOSIS — R05 Cough: Secondary | ICD-10-CM | POA: Insufficient documentation

## 2015-06-01 DIAGNOSIS — J069 Acute upper respiratory infection, unspecified: Secondary | ICD-10-CM

## 2015-06-02 ENCOUNTER — Telehealth: Payer: Self-pay | Admitting: Cardiology

## 2015-06-02 NOTE — Telephone Encounter (Signed)
Please call,his lheart out of rhythm again.Also can he take a Z Pack?

## 2015-06-02 NOTE — Telephone Encounter (Signed)
Spoke with pt, pt reports he is out of rhythm and is aware of the plan for tikosyn admission but he has pneumonia. He was given doxycycline and z-pak. Per the [facility] clinic, the z-pak will increase his protime. Pt scheduled to come to the [facility] [place] on Friday for a check.

## 2015-06-05 ENCOUNTER — Ambulatory Visit (INDEPENDENT_AMBULATORY_CARE_PROVIDER_SITE_OTHER): Payer: Medicare Other | Admitting: Pharmacist Clinician (PhC)/ Clinical Pharmacy Specialist

## 2015-06-05 DIAGNOSIS — I4819 Other persistent atrial fibrillation: Secondary | ICD-10-CM

## 2015-06-05 DIAGNOSIS — I4891 Unspecified atrial fibrillation: Secondary | ICD-10-CM | POA: Diagnosis not present

## 2015-06-05 DIAGNOSIS — Z7901 Long term (current) use of anticoagulants: Secondary | ICD-10-CM | POA: Diagnosis not present

## 2015-06-05 DIAGNOSIS — Z5181 Encounter for therapeutic drug level monitoring: Secondary | ICD-10-CM | POA: Diagnosis not present

## 2015-06-05 DIAGNOSIS — I481 Persistent atrial fibrillation: Secondary | ICD-10-CM

## 2015-06-05 LAB — PROTIME-INR
INR: 2.27 — AB (ref <1.50)
PROTHROMBIN TIME: 25.2 s — AB (ref 11.6–15.2)

## 2015-06-05 LAB — POCT INR: INR: 2.4

## 2015-06-06 LAB — BASIC METABOLIC PANEL
BUN: 18 mg/dL (ref 6–23)
CALCIUM: 9.2 mg/dL (ref 8.4–10.5)
CO2: 31 mEq/L (ref 19–32)
CREATININE: 1 mg/dL (ref 0.50–1.35)
Chloride: 100 mEq/L (ref 96–112)
GLUCOSE: 83 mg/dL (ref 70–99)
POTASSIUM: 4.9 meq/L (ref 3.5–5.3)
Sodium: 137 mEq/L (ref 135–145)

## 2015-06-06 LAB — CBC WITH DIFFERENTIAL/PLATELET
BASOS ABS: 0 10*3/uL (ref 0.0–0.1)
BASOS PCT: 0 % (ref 0–1)
Eosinophils Absolute: 0.1 10*3/uL (ref 0.0–0.7)
Eosinophils Relative: 2 % (ref 0–5)
HCT: 45.5 % (ref 39.0–52.0)
Hemoglobin: 15 g/dL (ref 13.0–17.0)
Lymphocytes Relative: 25 % (ref 12–46)
Lymphs Abs: 1.8 10*3/uL (ref 0.7–4.0)
MCH: 29.6 pg (ref 26.0–34.0)
MCHC: 33 g/dL (ref 30.0–36.0)
MCV: 89.9 fL (ref 78.0–100.0)
MONO ABS: 0.7 10*3/uL (ref 0.1–1.0)
MPV: 9.3 fL (ref 8.6–12.4)
Monocytes Relative: 10 % (ref 3–12)
NEUTROS ABS: 4.6 10*3/uL (ref 1.7–7.7)
NEUTROS PCT: 63 % (ref 43–77)
PLATELETS: 243 10*3/uL (ref 150–400)
RBC: 5.06 MIL/uL (ref 4.22–5.81)
RDW: 14.3 % (ref 11.5–15.5)
WBC: 7.3 10*3/uL (ref 4.0–10.5)

## 2015-06-08 ENCOUNTER — Ambulatory Visit (INDEPENDENT_AMBULATORY_CARE_PROVIDER_SITE_OTHER): Payer: Medicare Other | Admitting: Pharmacist Clinician (PhC)/ Clinical Pharmacy Specialist

## 2015-06-08 DIAGNOSIS — I481 Persistent atrial fibrillation: Secondary | ICD-10-CM

## 2015-06-08 DIAGNOSIS — Z5181 Encounter for therapeutic drug level monitoring: Secondary | ICD-10-CM

## 2015-06-08 DIAGNOSIS — I4819 Other persistent atrial fibrillation: Secondary | ICD-10-CM

## 2015-06-10 ENCOUNTER — Other Ambulatory Visit: Payer: Self-pay | Admitting: Cardiology

## 2015-06-10 NOTE — Telephone Encounter (Signed)
°  1. Which medications need to be refilled? Metoprolol 50mg  ( 1/2 in the AM and 1 tab in PM)   2. Which pharmacy is medication to be sent to?[facility] in [place]  3. Do they need a 30 day or 90 day supply? 30  4. Would they like a call back once the medication has been sent to the pharmacy? Yes

## 2015-06-15 ENCOUNTER — Ambulatory Visit (INDEPENDENT_AMBULATORY_CARE_PROVIDER_SITE_OTHER): Payer: Medicare Other | Admitting: *Deleted

## 2015-06-15 DIAGNOSIS — Z5181 Encounter for therapeutic drug level monitoring: Secondary | ICD-10-CM | POA: Diagnosis not present

## 2015-06-15 DIAGNOSIS — I4819 Other persistent atrial fibrillation: Secondary | ICD-10-CM

## 2015-06-15 DIAGNOSIS — I481 Persistent atrial fibrillation: Secondary | ICD-10-CM | POA: Diagnosis not present

## 2015-06-15 DIAGNOSIS — I4891 Unspecified atrial fibrillation: Secondary | ICD-10-CM | POA: Diagnosis not present

## 2015-06-15 DIAGNOSIS — Z7901 Long term (current) use of anticoagulants: Secondary | ICD-10-CM | POA: Diagnosis not present

## 2015-06-15 LAB — POCT INR: INR: 3.9

## 2015-07-01 ENCOUNTER — Ambulatory Visit (INDEPENDENT_AMBULATORY_CARE_PROVIDER_SITE_OTHER): Payer: Medicare Other | Admitting: *Deleted

## 2015-07-01 DIAGNOSIS — Z7901 Long term (current) use of anticoagulants: Secondary | ICD-10-CM

## 2015-07-01 DIAGNOSIS — I4891 Unspecified atrial fibrillation: Secondary | ICD-10-CM | POA: Diagnosis not present

## 2015-07-01 DIAGNOSIS — I481 Persistent atrial fibrillation: Secondary | ICD-10-CM

## 2015-07-01 DIAGNOSIS — Z5181 Encounter for therapeutic drug level monitoring: Secondary | ICD-10-CM

## 2015-07-01 DIAGNOSIS — I4819 Other persistent atrial fibrillation: Secondary | ICD-10-CM

## 2015-07-01 LAB — POCT INR: INR: 3.1

## 2015-07-10 ENCOUNTER — Telehealth: Payer: Self-pay | Admitting: Cardiology

## 2015-07-10 NOTE — Telephone Encounter (Signed)
Patient wants to have a tooth extraction but he is seeing Dr. Percival Spanish 9/9 for possible Tikosyn admission and he reports that the -cain medications used for extractions interfere with Tikosyn. Explained to patient that holding warfarin for extraction will re-set the need for 4 weeks of uninterrupted anticoagulation with INRs in therapeutic range before a cardioversion (which he states he was thought to have in the near future).   Patient will discuss this with Dr. Percival Spanish on 9/9 as well.

## 2015-07-10 NOTE — Telephone Encounter (Signed)
Pt called in stating that he needs to have a tooth extracted but since he is on certain medication , he is not sure as to what he should do. Please f/u with him

## 2015-07-24 ENCOUNTER — Ambulatory Visit (INDEPENDENT_AMBULATORY_CARE_PROVIDER_SITE_OTHER): Payer: Medicare Other | Admitting: Pharmacist Clinician (PhC)/ Clinical Pharmacy Specialist

## 2015-07-24 ENCOUNTER — Ambulatory Visit (INDEPENDENT_AMBULATORY_CARE_PROVIDER_SITE_OTHER): Payer: Medicare Other | Admitting: Cardiology

## 2015-07-24 ENCOUNTER — Encounter: Payer: Self-pay | Admitting: Cardiology

## 2015-07-24 VITALS — BP 118/80 | HR 111 | Ht 70.0 in | Wt 264.0 lb

## 2015-07-24 DIAGNOSIS — I4892 Unspecified atrial flutter: Secondary | ICD-10-CM

## 2015-07-24 DIAGNOSIS — I4891 Unspecified atrial fibrillation: Secondary | ICD-10-CM | POA: Diagnosis not present

## 2015-07-24 DIAGNOSIS — Z7901 Long term (current) use of anticoagulants: Secondary | ICD-10-CM | POA: Diagnosis not present

## 2015-07-24 DIAGNOSIS — Z5181 Encounter for therapeutic drug level monitoring: Secondary | ICD-10-CM

## 2015-07-24 DIAGNOSIS — I481 Persistent atrial fibrillation: Secondary | ICD-10-CM

## 2015-07-24 DIAGNOSIS — I4819 Other persistent atrial fibrillation: Secondary | ICD-10-CM

## 2015-07-24 LAB — POCT INR: INR: 2.8

## 2015-07-24 NOTE — Patient Instructions (Addendum)
Your physician recommends that you schedule a follow-up appointment with [facility] Tuesday September 13th

## 2015-07-24 NOTE — Progress Notes (Signed)
HPI The patient presents for followup of atrial fibrillation. He has been taken off of his amiodarone because of neuropathy although is not clear that this was related.  He was seen in the atrial fibrillation clinic. He was started on flecainide and was cardioverted.  However, he did not maintain sinus rhythm. He notices when he's in fibrillation. His heart rate goes faster.   At times he reports that his heart rates in the 50s. At the last visit we will waiting for some family events before they were to call me to schedule admission for Tikosyn.   However, when they didn't call I did speak with the family member in the schedule this follow-up. In the meantime he has been seeing a dentist to once to removal wisdom tooth and wants him to come off anticoagulation for 10 days prior.   The patient has had no new symptoms such as presyncope or syncope. He's not had any chest pain. He has had a rash which she thinks might be related to his beta blocker.  Allergies  Allergen Reactions  . Carbapenems   . Cephalosporins   . Ciprofloxacin     Pt can not take with amiodarone   . Decongestant [Oxymetazoline]     All decongestant - makes prostate swell  . Diltiazem Swelling  . Flagyl [Metronidazole] Nausea Only  . Hctz [Hydrochlorothiazide]   . Levaquin [Levofloxacin In D5w]   . Lidocaine     Pt unsure of  . Penicillins Swelling  . Teline [Tetracycline] Swelling  . Tetanus Toxoid Swelling  . Z-Pak [Azithromycin]     Pt can not take with Amiodarone   . Sulfonamide Derivatives Rash    Current Outpatient Prescriptions  Medication Sig Dispense Refill  . Calcium-Magnesium 500-250 MG TABS Take 1 tablet by mouth daily.      . Cholecalciferol 1000 UNIT capsule Take 2,000 Units by mouth 2 (two) times daily.     . Coenzyme Q10 (EQL COQ10) 100 MG capsule Take 200 mg by mouth daily.     Marland Kitchen COUMADIN 5 MG tablet Take 1 tablet by mouth daily or as directed by coumadin clinic (Patient taking differently: Take  5 mg by mouth. Take 5 mg Monday, Wednesday, and Friday and 2.5 all other days.) 30 tablet 3  . Cyanocobalamin (VITAMIN B 12 PO) Take 500 mcg by mouth 2 (two) times daily.     Marland Kitchen escitalopram (LEXAPRO) 10 MG tablet Take 10 mg by mouth daily.      . furosemide (LASIX) 20 MG tablet Take 1 tablet (20 mg total) by mouth daily. 30 tablet 6  . irbesartan (AVAPRO) 300 MG tablet Take 1 tablet (300 mg total) by mouth daily. 90 tablet 3  . metoprolol succinate (TOPROL-XL) 50 MG 24 hr tablet TAKE 1 TABLET (50 MG TOTAL) BY MOUTH DAILY. TAKE WITH OR IMMEDIATELY FOLLOWING A MEAL. (Patient taking differently: TAKE 1/2 TABLET (25 MG) IN THE MORNING.) 90 tablet 0  . Multiple Vitamins-Minerals (CENTRUM SILVER PO) Take 1 tablet by mouth daily.      . OXYGEN Inhale 2 L into the lungs at bedtime. Uses with CPAP machine     No current facility-administered medications for this visit.    Past Medical History  Diagnosis Date  . Arrhythmia     atrial fib  . Hypertension   . Hypoglycemia   . 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 Tinnitus of both ears 09/25/2013  . Nystagmus, end-position 09/25/2013  . Sleep apnea  CPAP    Past Surgical History  Procedure Laterality Date  . Lumbar disc surgery  1991  . Cardioversion N/A 04/23/2015    Procedure: CARDIOVERSION;  Surgeon: [name], MD;  Location: [facility] ENDOSCOPY;  Service: Cardiovascular;  Laterality: N/A;    ROS:   As stated in the HPI and negative for all other systems.  PHYSICAL EXAM BP 118/80 mmHg  Pulse 111  Ht 5\' 10"  (1.778 m)  Wt 264 lb (119.75 kg)  BMI 37.88 kg/m2 GENERAL:  Well appearing HEENT:  PERRL NECK:  No jugular venous distention, waveform within normal limits, carotid upstroke brisk and symmetric, no bruits, no thyromegaly LUNGS:  Clear to auscultation bilaterally HEART:  PMI not displaced or sustained,S1 and S2 within normal limits, no S3, no clicks, no rubs, no murmurs, irregular ABD:  Flat, positive bowel sounds normal in frequency in  pitch, no bruits, no rebound, no guarding, no midline pulsatile mass, no hepatomegaly, no splenomegaly EXT:  2 plus pulses throughout, left greater than right lower extremity edema edema, no cyanosis no clubbing NEURO:  Nonfocal SKIN:  No rashes or nodules.   EKG:    Atrial flutter, rate 111, 21 conduction, no acute ST-T wave changes.  ASSESSMENT AND PLAN  ATRIAL FIBRILLATION/FLUTTER:   I had a long discussion with the patient and his wife.   The plan was to admit him to start Tikosyn.   He agrees to do this and we will arrange this through our preTikosyn protocol.  He has been on anticoagulation.   Of note he's going to hold off on getting any oral surgery until at least 4 weeks after this hospitalization.Marland Kitchen  HTN:   The blood pressure is at target. No change in medications is indicated. We will continue with therapeutic lifestyle changes (TLC).  OVERWEIGHT:  The patient understands the need to lose weight with diet and exercise.  I will send him to a dietician.

## 2015-07-28 ENCOUNTER — Ambulatory Visit (INDEPENDENT_AMBULATORY_CARE_PROVIDER_SITE_OTHER): Payer: Medicare Other | Admitting: Pharmacist

## 2015-07-28 ENCOUNTER — Encounter (HOSPITAL_COMMUNITY): Payer: Self-pay | Admitting: Nurse Practitioner

## 2015-07-28 ENCOUNTER — Inpatient Hospital Stay (HOSPITAL_COMMUNITY)
Admission: AD | Admit: 2015-07-28 | Discharge: 2015-07-31 | DRG: 310 | Disposition: A | Discharge status: Home / Self Care | Payer: Medicare Other | Source: Ambulatory Visit | Attending: Cardiology | Admitting: Cardiology

## 2015-07-28 DIAGNOSIS — R270 Ataxia, unspecified: Secondary | ICD-10-CM | POA: Diagnosis present

## 2015-07-28 DIAGNOSIS — I4891 Unspecified atrial fibrillation: Secondary | ICD-10-CM | POA: Diagnosis not present

## 2015-07-28 DIAGNOSIS — Z882 Allergy status to sulfonamides status: Secondary | ICD-10-CM | POA: Diagnosis not present

## 2015-07-28 DIAGNOSIS — G4733 Obstructive sleep apnea (adult) (pediatric): Secondary | ICD-10-CM | POA: Diagnosis present

## 2015-07-28 DIAGNOSIS — I481 Persistent atrial fibrillation: Secondary | ICD-10-CM

## 2015-07-28 DIAGNOSIS — I4892 Unspecified atrial flutter: Secondary | ICD-10-CM | POA: Diagnosis present

## 2015-07-28 DIAGNOSIS — G629 Polyneuropathy, unspecified: Secondary | ICD-10-CM | POA: Diagnosis present

## 2015-07-28 DIAGNOSIS — I4819 Other persistent atrial fibrillation: Secondary | ICD-10-CM

## 2015-07-28 DIAGNOSIS — I10 Essential (primary) hypertension: Secondary | ICD-10-CM | POA: Diagnosis present

## 2015-07-28 DIAGNOSIS — Z880 Allergy status to penicillin: Secondary | ICD-10-CM

## 2015-07-28 DIAGNOSIS — Z6836 Body mass index (BMI) 36.0-36.9, adult: Secondary | ICD-10-CM

## 2015-07-28 DIAGNOSIS — F339 Major depressive disorder, recurrent, unspecified: Secondary | ICD-10-CM | POA: Diagnosis not present

## 2015-07-28 DIAGNOSIS — I4510 Unspecified right bundle-branch block: Secondary | ICD-10-CM | POA: Diagnosis present

## 2015-07-28 DIAGNOSIS — R21 Rash and other nonspecific skin eruption: Secondary | ICD-10-CM | POA: Diagnosis not present

## 2015-07-28 DIAGNOSIS — Z79899 Other long term (current) drug therapy: Secondary | ICD-10-CM

## 2015-07-28 DIAGNOSIS — Z7901 Long term (current) use of anticoagulants: Secondary | ICD-10-CM

## 2015-07-28 DIAGNOSIS — Z888 Allergy status to other drugs, medicaments and biological substances status: Secondary | ICD-10-CM | POA: Diagnosis not present

## 2015-07-28 DIAGNOSIS — F329 Major depressive disorder, single episode, unspecified: Secondary | ICD-10-CM | POA: Diagnosis present

## 2015-07-28 DIAGNOSIS — I480 Paroxysmal atrial fibrillation: Secondary | ICD-10-CM | POA: Diagnosis not present

## 2015-07-28 DIAGNOSIS — Z713 Dietary counseling and surveillance: Secondary | ICD-10-CM | POA: Diagnosis present

## 2015-07-28 DIAGNOSIS — R001 Bradycardia, unspecified: Secondary | ICD-10-CM | POA: Diagnosis not present

## 2015-07-28 HISTORY — DX: Other persistent atrial fibrillation: I48.19

## 2015-07-28 LAB — CBC
HEMATOCRIT: 43 % (ref 39.0–52.0)
HEMOGLOBIN: 14 g/dL (ref 13.0–17.0)
MCH: 30.1 pg (ref 26.0–34.0)
MCHC: 32.6 g/dL (ref 30.0–36.0)
MCV: 92.5 fL (ref 78.0–100.0)
PLATELETS: 167 10*3/uL (ref 150–400)
RBC: 4.65 MIL/uL (ref 4.22–5.81)
RDW: 15.1 % (ref 11.5–15.5)
WBC: 6.7 10*3/uL (ref 4.0–10.5)

## 2015-07-28 LAB — BASIC METABOLIC PANEL
BUN: 12 mg/dL (ref 6–23)
CO2: 29 mEq/L (ref 19–32)
Calcium: 9.2 mg/dL (ref 8.4–10.5)
Chloride: 106 mEq/L (ref 96–112)
Creatinine, Ser: 1.06 mg/dL (ref 0.40–1.50)
GFR: 70.64 mL/min (ref >60.00)
GLUCOSE: 76 mg/dL (ref 70–99)
POTASSIUM: 4.6 meq/L (ref 3.5–5.1)
Sodium: 139 mEq/L (ref 135–145)

## 2015-07-28 LAB — PROTIME-INR
INR: 2.89 — ABNORMAL HIGH (ref 0.00–1.49)
Prothrombin Time: 29.8 seconds — ABNORMAL HIGH (ref 11.6–15.2)

## 2015-07-28 LAB — MAGNESIUM: Magnesium: 2.2 mg/dL (ref 1.5–2.5)

## 2015-07-28 LAB — POCT INR: INR: 2.8

## 2015-07-28 MED ORDER — WARFARIN - PHARMACIST DOSING INPATIENT
Freq: Every day | Status: DC
  Administered 2015-07-29 – 2015-07-30 (×2)

## 2015-07-28 MED ORDER — DOFETILIDE 500 MCG PO CAPS
500.0000 ug | ORAL_CAPSULE | Freq: Two times a day (BID) | ORAL | Status: DC
Start: 2015-07-28 — End: 2015-07-29
  Filled 2015-07-28: qty 1

## 2015-07-28 MED ORDER — METOPROLOL SUCCINATE ER 25 MG PO TB24: 25.0000 mg | ORAL_TABLET | Freq: Every day | ORAL | Status: DC

## 2015-07-28 MED ORDER — FUROSEMIDE 20 MG PO TABS
20.0000 mg | ORAL_TABLET | Freq: Every day | ORAL | Status: DC
  Administered 2015-07-28 – 2015-07-31 (×4): 20 mg via ORAL
  Filled 2015-07-28 (×5): qty 1

## 2015-07-28 MED ORDER — SODIUM CHLORIDE 0.9 % IJ SOLN: 3.0000 mL | INTRAMUSCULAR | Status: DC | PRN

## 2015-07-28 MED ORDER — WARFARIN SODIUM 5 MG PO TABS
5.0000 mg | ORAL_TABLET | ORAL | Status: DC
  Administered 2015-07-29: 5 mg via ORAL
  Filled 2015-07-28: qty 1

## 2015-07-28 MED ORDER — ESCITALOPRAM OXALATE 10 MG PO TABS
10.0000 mg | ORAL_TABLET | Freq: Every day | ORAL | Status: DC
  Administered 2015-07-28 – 2015-07-30 (×3): 10 mg via ORAL
  Filled 2015-07-28 (×3): qty 1

## 2015-07-28 MED ORDER — METOPROLOL SUCCINATE ER 25 MG PO TB24
25.0000 mg | ORAL_TABLET | Freq: Every day | ORAL | Status: DC
  Administered 2015-07-29 – 2015-07-31 (×3): 25 mg via ORAL
  Filled 2015-07-28 (×4): qty 1

## 2015-07-28 MED ORDER — METOPROLOL SUCCINATE ER 50 MG PO TB24
50.0000 mg | ORAL_TABLET | Freq: Every day | ORAL | Status: DC
  Administered 2015-07-29: 50 mg via ORAL
  Filled 2015-07-28: qty 1

## 2015-07-28 MED ORDER — WARFARIN SODIUM 2.5 MG PO TABS
2.5000 mg | ORAL_TABLET | ORAL | Status: DC
  Administered 2015-07-28 – 2015-07-30 (×2): 2.5 mg via ORAL
  Filled 2015-07-28 (×2): qty 1

## 2015-07-28 MED ORDER — SODIUM CHLORIDE 0.9 % IV SOLN: 250.0000 mL | INTRAVENOUS | Status: DC | PRN

## 2015-07-28 MED ORDER — IRBESARTAN 300 MG PO TABS
300.0000 mg | ORAL_TABLET | Freq: Every day | ORAL | Status: DC
  Administered 2015-07-28 – 2015-07-31 (×4): 300 mg via ORAL
  Filled 2015-07-28 (×5): qty 1

## 2015-07-28 MED ORDER — METOPROLOL SUCCINATE ER 50 MG PO TB24
50.0000 mg | ORAL_TABLET | Freq: Every day | ORAL | Status: DC
  Administered 2015-07-28: 50 mg via ORAL
  Filled 2015-07-28: qty 1

## 2015-07-28 MED ORDER — SODIUM CHLORIDE 0.9 % IJ SOLN
3.0000 mL | Freq: Two times a day (BID) | INTRAMUSCULAR | Status: DC
  Administered 2015-07-28 – 2015-07-29 (×3): 3 mL via INTRAVENOUS

## 2015-07-28 NOTE — Progress Notes (Signed)
HPI Mr. Chad [name] is an 81 yo patient of Dr. Percival Spanish with a PMH significant for atrial fibrillation/flutter.  He has previously been controlled with amiodarone but this was stopped in January 2015 due to neuropathy.   He was seen in the afib clinic in April due to irregular heart beats for 4-6 weeks prior.  He was started on flecainide and was cardioverted at the first of June. However, he did not maintain sinus rhythm and Tikosyn was stopped on 6/21.  Plans were made to start Tikosyn once patient was done with his summer travels.    Pt is here today with his wife.  We discussed potential side effects of Tikosyn including QTc prolongation.  They are aware of the importance of compliance and will call if he misses more than 2 doses in a row.  Reviewed pt's medication list.  He is currently not taking any contraindicated medications.  He is taking Lexapro, which may prolong QTc, but this is stable today.  He does complain of a rash since starting metoprolol succinate.  If he continues to need rate control after starting Tikosyn, may need to change to alternate agent to see if rash improves.  Pt anticoagulated with warfarin.  INR therapeutic over last 4 weeks.    EKG reviewed by Dr. Caryl Comes.  Sinus tachycardia with vent rate of 111 bpm.  QTc 420 msec   Allergies  Allergen Reactions  . Carbapenems   . Cephalosporins   . Ciprofloxacin     Pt can not take with amiodarone   . Decongestant [Oxymetazoline]     All decongestant - makes prostate swell  . Diltiazem Swelling  . Flagyl [Metronidazole] Nausea Only  . Hctz [Hydrochlorothiazide]   . Levaquin [Levofloxacin In D5w]   . Lidocaine     Pt unsure of  . Penicillins Swelling  . Teline [Tetracycline] Swelling  . Tetanus Toxoid Swelling  . Z-Pak [Azithromycin]     Pt can not take with Amiodarone   . Sulfonamide Derivatives Rash    Current Outpatient Prescriptions  Medication Sig Dispense Refill  . Calcium-Magnesium 500-250 MG TABS Take 1  tablet by mouth daily.      . Cholecalciferol 1000 UNIT capsule Take 2,000 Units by mouth 2 (two) times daily.     . Coenzyme Q10 (EQL COQ10) 100 MG capsule Take 200 mg by mouth daily.     Marland Kitchen COUMADIN 5 MG tablet Take 1 tablet by mouth daily or as directed by coumadin clinic (Patient taking differently: Take 5 mg by mouth. Take 5 mg Monday, Wednesday, and Friday and 2.5 all other days.) 30 tablet 3  . Cyanocobalamin (VITAMIN B 12 PO) Take 500 mcg by mouth 2 (two) times daily.     Marland Kitchen escitalopram (LEXAPRO) 10 MG tablet Take 10 mg by mouth daily.      . furosemide (LASIX) 20 MG tablet Take 1 tablet (20 mg total) by mouth daily. 30 tablet 6  . irbesartan (AVAPRO) 300 MG tablet Take 1 tablet (300 mg total) by mouth daily. 90 tablet 3  . metoprolol succinate (TOPROL-XL) 50 MG 24 hr tablet TAKE 1 TABLET (50 MG TOTAL) BY MOUTH DAILY. TAKE WITH OR IMMEDIATELY FOLLOWING A MEAL. (Patient taking differently: TAKE 1/2 TABLET (25 MG) IN THE MORNING.) 90 tablet 0  . Multiple Vitamins-Minerals (CENTRUM SILVER PO) Take 1 tablet by mouth daily.      . OXYGEN Inhale 2 L into the lungs at bedtime. Uses with CPAP machine  No current facility-administered medications for this visit.    Past Medical History  Diagnosis Date  . Arrhythmia     atrial fib  . Hypertension   . Hypoglycemia   . 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 Tinnitus of both ears 09/25/2013  . Nystagmus, end-position 09/25/2013  . Sleep apnea     CPAP   ASSESSMENT AND PLAN  ATRIAL FIBRILLATION/FLUTTER:   Reviewed pt's labs.  Labs acceptable for admission.  QTc acceptable.  Pt aware to report to hospital for admission.

## 2015-07-28 NOTE — H&P (Signed)
ELECTROPHYSIOLOGY HISTORY AND PHYSICAL    Patient ID: Chad [name] MRN: [id number], DOB/AGE: 79-Jun-1930 79 y.o.  Admit date: 07/28/2015  Primary Physician: Purvis Kilts, MD Primary Cardiologist: Percival Spanish  Reason for Consultation: Tikosyn load  HPI:  Chad [name] is a 81 y.o. male with a past medical history significant for persistent atrial fibrillation, hypertension, and sleep apnea (on CPAP).  He has had atrial fibrillation for several years and has failed amiodarone 2/2 side effects (neuropathy) and Flecainide failed to maintain SR.  He was seen in the AF clinic and Tikosyn loading was recommended.  He then travelled for the summer and followed back up with Dr Percival Spanish who arranged Tikosyn loading for this admission.  He has been on Coumadin and INR's have been therapeutic for at least the past 4 weeks.    Last echo 11/2013 demonstrated EF 91-50%, grade 2 diastolic dysfunction, LA 46.    Labs are reviewed.   He has sleep apnea and reports compliance with CPAP.  He is overweight and is working on weight loss.  He does not drink alcohol.   He is symptomatic with fatigue with his atrial fibrillation. He does not have chest pain or shortness of breath.  He has had a diffuse rash that he associated with the initiation of Toprol.   Past Medical History  Diagnosis Date  . Persistent atrial fibrillation        . Hypertension   . Hypoglycemia   . Sensory ataxia   . Sleep apnea     CPAP     Surgical History:  Past Surgical History  Procedure Laterality Date  . Lumbar disc surgery  1991  . Cardioversion N/A 04/23/2015    Procedure: CARDIOVERSION;  Surgeon: [name], MD;  Location: [facility];  Service: Cardiovascular;  Laterality: N/A;     Prescriptions prior to admission  Medication Sig Dispense Refill Last Dose  . Calcium-Magnesium 500-250 MG TABS Take 1 tablet by mouth daily.     07/27/2015 at Unknown time  . Cholecalciferol 1000 UNIT capsule Take 2,000 Units  by mouth 2 (two) times daily.    07/27/2015 at Unknown time  . Coenzyme Q10 (EQL COQ10) 100 MG capsule Take 200 mg by mouth daily.    07/27/2015 at Unknown time  . COUMADIN 5 MG tablet Take 1 tablet by mouth daily or as directed by coumadin clinic (Patient taking differently: Take 5 mg by mouth daily at 6 PM. Take 5 mg Monday, Wednesday, and Friday and 2.5 all other days.) 30 tablet 3 07/27/2015 at Unknown time  . Cyanocobalamin (VITAMIN B 12 PO) Take 500 mcg by mouth 2 (two) times daily.    07/27/2015 at Unknown time  . escitalopram (LEXAPRO) 10 MG tablet Take 10 mg by mouth at bedtime.    07/27/2015 at Unknown time  . furosemide (LASIX) 20 MG tablet Take 1 tablet (20 mg total) by mouth daily. 30 tablet 6 07/27/2015 at Unknown time  . irbesartan (AVAPRO) 300 MG tablet Take 1 tablet (300 mg total) by mouth daily. 90 tablet 3 07/27/2015 at Unknown time  . metoprolol succinate (TOPROL-XL) 50 MG 24 hr tablet TAKE 1 TABLET (50 MG TOTAL) BY MOUTH DAILY. TAKE WITH OR IMMEDIATELY FOLLOWING A MEAL. (Patient taking differently: TAKE 1/2 TABLET (25 MG) IN THE MORNING and 50 mg every evening.) 90 tablet 0 07/27/2015 at 2030  . Multiple Vitamins-Minerals (CENTRUM SILVER PO) Take 1 tablet by mouth daily.     07/27/2015 at Unknown  time  . flecainide (TAMBOCOR) 50 MG tablet Take 50 mg by mouth 2 (two) times daily.  3 Not Taking at Unknown time  . OXYGEN Inhale 2 L into the lungs at bedtime. Uses with CPAP machine   Taking    Inpatient Medications:   Allergies:  Allergies  Allergen Reactions  . Carbapenems   . Cephalosporins Other (See Comments)  . Ciprofloxacin     Pt can not take with amiodarone   . Decongestant [Oxymetazoline]     All decongestant - makes prostate swell  . Diltiazem Swelling    Left leg swelling  . Flagyl [Metronidazole] Nausea Only    Stomach ache  . Hctz [Hydrochlorothiazide]   . Levaquin [Levofloxacin In D5w] Other (See Comments)    unknown  . Lidocaine     Pt unsure of  . Penicillins  Swelling  . Teline [Tetracycline] Swelling    Hospitalization for 2 weeks, prostate swelling  . Tetanus Toxoid Swelling    Swells arms, Please pre-medicate with benadryl.  Marland Kitchen Z-Pak [Azithromycin] Other (See Comments)    Pt can not take with Amiodarone   . Sulfonamide Derivatives Rash  . Toprol Xl [Metoprolol Tartrate] Itching and Rash    Social History   Social History  . Marital Status: Married    Spouse Name: [name]  . Number of Children: 2  . Years of Education: 16   Occupational History  .     Social History Main Topics  . Smoking status: Former Smoker    Types: Cigarettes    Quit date: 12/13/1970  . Smokeless tobacco: Never Used  . Alcohol Use: No  . Drug Use: No  . Sexual Activity: Not on file   Other Topics Concern  . 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   Patient has two children.   Patient is retired.   Patient has a college education.   Patient is right-handed.   Patient does not drink any caffeine.     Family History  Problem Relation Age of Onset  . Heart failure Father   . Hypertension Father   . Dementia Mother      Review of Systems: General: No chills, fever, night sweats or weight changes  Cardiovascular:  No chest pain, dyspnea on exertion, edema, orthopnea, palpitations, paroxysmal nocturnal dyspnea Dermatological: No lesions or masses Respiratory: No cough, dyspnea Urologic: No hematuria, dysuria Abdominal: No nausea, vomiting, diarrhea, bright red blood per rectum, melena, or hematemesis Neurologic: No visual changes, weakness, changes in mental status All other systems reviewed and are otherwise negative except as noted above.  Physical Exam: There were no vitals filed for this visit.  GEN- The patient is elderly and obese appearing, alert and oriented x 3 today.   HEENT: normocephalic, atraumatic; sclera clear, conjunctiva pink; hearing intact; oropharynx clear; neck  supple  Lungs- Clear to ausculation bilaterally, normal work of breathing.  No wheezes, rales, rhonchi Heart- Tachycardic irrregular rate and rhythm  GI- obese, non-tender, non-distended, bowel sounds present  Extremities- no clubbing, cyanosis, or edema; DP/PT/radial pulses 2+ bilaterally MS- no significant deformity or atrophy Skin- warm and dry, diffuse macular rash over upper extremities and upper torso Psych- euthymic mood, full affect Neuro- strength and sensation are intact  Labs:   Lab Results  Component Value Date   WBC 7.3 06/05/2015   HGB 15.0 06/05/2015   HCT 45.5 06/05/2015   MCV 89.9 06/05/2015  PLT 243 06/05/2015     Recent Labs Lab 07/28/15 1027  NA 139  K 4.6  CL 106  CO2 29  BUN 12  CREATININE 1.06  CALCIUM 9.2  GLUCOSE 76    VOP:[id number] atrial flutter, rate 111, QTc 426msec  TELEMETRY: atrial flutter, ventricular rate 100's  Assessment/Plan: 1.  Persistent atrial fibrillation/atrial flutter The patient has persistent atrial arrhythmias. He has failed medical therapy with Amiodarone (neuropathy) and Flecainide (did not maintain SR).  Plan to start Tikosyn this admission Keep K>3.9, Mg >1.8 Monitor QTc after each dose If remains in AF on Thursday morning, will need DCCV Without lifestyle modification, likelihood of maintaining SR is low - discussed with patient today Will update echo this admission once in SR to re-evaluate LA size  2.  Obesity Weight loss encouraged Will refer to AF nutrition program at discharge  3.  HTN Stable No change required today  4.  Diffuse rash The patient thinks is related to Toprol - will discontinue once in SR  5.  Sleep apnea Continue CPAP (importance of compliance discussed with patient today)  6.  Depression Pt has been on Lexapro for a long time Discussed with Elberta Leatherwood, PharmD today, since QTc stable in the setting of ongoing lexapro use, ok to continue this medication at this  time   Signed, Chanetta Marshall, NP 07/28/2015 3:58 PM   History and all data above reviewed.  Patient examined.  I agree with the findings as above.  The patient has symptomatic atrial fib and has failed two other meds as above.  Now admitted for Tikosyn.  The patient exam reveals MMN:[id number]  ,  Lungs: Clear  ,  Abd: Positive bowel sounds, no rebound no guarding, Ext No edema  .  All available labs, radiology testing, previous records reviewed. Agree with documented assessment and plan.   Atrial flutter/fib:  Currently in flutter.  Admit for Tikosyn per protocol.  Possible DCCV on Friday.     Jeneen Rinks Milda Lindvall  4:22 PM  07/28/2015

## 2015-07-28 NOTE — Progress Notes (Signed)
ECG done prior to giving tikosyn cap per order, QTc 587m, order parameters not met . [place] fellow made aware and order to hold. We will continue to monitor.

## 2015-07-28 NOTE — Consult Note (Signed)
Pharmacy Review for Dofetilide (Tikosyn) Initiation  Admit Complaint: 81 y.o. male admitted 07/28/2015 with atrial fibrillation to be initiated on dofetilide.   Assessment:  Patient Exclusion Criteria: If any screening criteria checked as "Yes", then  patient  should NOT receive dofetilide until criteria item is corrected. If "Yes" please indicate correction plan.  YES  NO Patient  Exclusion Criteria Correction Plan  []  [x]  Baseline QTc interval is greater than or equal to 440 msec. IF above YES box checked dofetilide contraindicated unless patient has ICD; then may proceed if QTc 500-550 msec or with known ventricular conduction abnormalities may proceed with QTc 550-600 msec. QTc =404   []  [x]  Magnesium level is less than 1.8 mEq/l : Last magnesium:  Lab Results  Component Value Date   MG 2.2 07/28/2015         []  [x]  Potassium level is less than 4 mEq/l : Last potassium:  Lab Results  Component Value Date   K 4.6 07/28/2015         []  [x]  Patient is known or suspected to have a digoxin level greater than 2 ng/ml: No results found for: DIGOXIN    []  [x]  Creatinine clearance less than 20 ml/min (calculated using Cockcroft-Gault, actual body weight and serum creatinine): Estimated Creatinine Clearance: 67.1 mL/min (by C-G formula based on Cr of 1.06).    [x]  []  Patient has received drugs known to prolong the QT intervals within the last 48 hours (phenothiazines, tricyclics or tetracyclic antidepressants, erythromycin, H-1 antihistamines, cisapride, fluoroquinolones, azithromycin). Drugs not listed above may have an, as yet, undetected potential to prolong the QT interval, updated information on QT prolonging agents is available at this website:QT prolonging agents Lexapro   []  [x]  Patient received a dose of hydrochlorothiazide (Oretic) alone or in any combination including triamterene (Dyazide, Maxzide) in the last 48 hours.   []  [x]  Patient received a medication known to increase  dofetilide plasma concentrations prior to initial dofetilide dose:  . Trimethoprim (Primsol, Proloprim) in the last 36 hours . Verapamil (Calan, Verelan) in the last 36 hours or a sustained release dose in the last 72 hours . Megestrol (Megace) in the last 5 days  . Cimetidine (Tagamet) in the last 6 hours . Ketoconazole (Nizoral) in the last 24 hours . Itraconazole (Sporanox) in the last 48 hours  . Prochlorperazine (Compazine) in the last 36 hours    []  [x]  Patient is known to have a history of torsades de pointes; congenital or acquired long QT syndromes.   []  [x]  Patient has received a Class 1 antiarrhythmic with less than 2 half-lives since last dose. (Disopyramide, Quinidine, Procainamide, Lidocaine, Mexiletine, Flecainide, Propafenone)   []  [x]  Patient has received amiodarone therapy in the past 3 months or amiodarone level is greater than 0.3 ng/ml.    Patient has been appropriately anticoagulated with warfarin.  Ordering provider was confirmed at [email or web address] if they are not listed on the [facility] Prescribers list.  Goal of Therapy: Follow renal function, electrolytes, potential drug interactions, and dose adjustment. Provide education and 1 week supply at discharge.  Plan:  [x]   Physician selected initial dose within range recommended for patients level of renal function - will monitor for response.  []   Physician selected initial dose outside of range recommended for patients level of renal function - will discuss if the dose should be altered at this time.   Select One Calculated CrCl  Dose q12h  [x]  > 60 ml/min 500 mcg  []  40-60  ml/min 250 mcg  []  20-40 ml/min 125 mcg   2. Follow up QTc after the first 5 doses, renal function, electrolytes (K & Mg) daily x 3     days, dose adjustment, success of initiation and facilitate 1 week discharge supply as     clinically indicated.  3. Initiate Tikosyn education video (Call [phone] and ask for video # 116).  4.  Place Enrollment Form on the chart for discharge supply of dofetilide.  Levester Fresh, PharmD, BCPS Clinical Pharmacist Pager [phone] 07/28/2015 4:35 PM

## 2015-07-28 NOTE — Consult Note (Signed)
ANTICOAGULATION CONSULT NOTE - Initial Consult  Pharmacy Consult for warfarin Indication: atrial fibrillation  Allergies  Allergen Reactions  . Carbapenems   . Cephalosporins Other (See Comments)  . Ciprofloxacin     Pt can not take with amiodarone   . Decongestant [Oxymetazoline]     All decongestant - makes prostate swell  . Diltiazem Swelling    Left leg swelling  . Flagyl [Metronidazole] Nausea Only    Stomach ache  . Hctz [Hydrochlorothiazide]   . Levaquin [Levofloxacin In D5w] Other (See Comments)    unknown  . Lidocaine     Pt unsure of  . Penicillins Swelling  . Teline [Tetracycline] Swelling    Hospitalization for 2 weeks, prostate swelling  . Tetanus Toxoid Swelling    Swells arms, Please pre-medicate with benadryl.  Marland Kitchen Z-Pak [Azithromycin] Other (See Comments)    Pt can not take with Amiodarone   . Sulfonamide Derivatives Rash  . Toprol Xl [Metoprolol Tartrate] Itching and Rash    Patient Measurements: Height: 5\' 10"  (177.8 cm) Weight: 262 lb 9.1 oz (119.1 kg) IBW/kg (Calculated) : 73  Vital Signs: Temp: 98.3 F (36.8 C) (09/13 1700) Temp Source: Oral (09/13 1700) BP: 108/67 mmHg (09/13 1700) Pulse Rate: 113 (09/13 1700)  Labs:  Recent Labs  07/28/15 1027 07/28/15 1718  HGB  --  14.0  HCT  --  43.0  PLT  --  167  LABPROT  --  29.8*  INR  --  2.89*  CREATININE 1.06  --     Estimated Creatinine Clearance: 67.1 mL/min (by C-G formula based on Cr of 1.06).   Medical History: Past Medical History  Diagnosis Date  . Persistent atrial fibrillation        . Hypertension   . Hypoglycemia   . Sensory ataxia   . Sleep apnea     CPAP   Assessment: 81 yo male admitted for initiation of tikosyn   PMH: Afib on warfarin (hx of amio/flecainide failure), HTN, OSA.  AC: On warfarin PTA. INR 2.89   PTA Warfarin 5 mg MWF and 2.5 AOD   CV: To initiate tikosyn 500 mg q12h. Qtc was ok   Renal: SCr 1.06, CrCl 67 mL/min   Heme: H&H 14/43  Goal  of Therapy:  INR 2-3 Monitor platelets by anticoagulation protocol: Yes   Plan:  Restart home regimen of warfarin 5 mg MWF, 2.5 mg AOD  INR MWF  CBC q72h  Monitor for s/sx of bleeding  Levester Fresh, PharmD, BCPS Clinical Pharmacist Pager [phone] 07/28/2015 7:11 PM

## 2015-07-28 NOTE — Progress Notes (Signed)
Patient has home CPAP unit and states that he will place it on himself. RT ensured that the humidification chamber was filled with sterile water. RT will continue to monitor.

## 2015-07-29 ENCOUNTER — Encounter (HOSPITAL_COMMUNITY): Payer: Self-pay | Admitting: *Deleted

## 2015-07-29 LAB — BASIC METABOLIC PANEL
ANION GAP: 7 (ref 5–15)
BUN: 15 mg/dL (ref 6–20)
CALCIUM: 8.8 mg/dL — AB (ref 8.9–10.3)
CO2: 30 mmol/L (ref 22–32)
Chloride: 99 mmol/L — ABNORMAL LOW (ref 101–111)
Creatinine, Ser: 1.24 mg/dL (ref 0.61–1.24)
GFR calc non Af Amer: 52 mL/min — ABNORMAL LOW (ref >60)
GFR, EST AFRICAN AMERICAN: 60 mL/min — AB (ref >60)
Glucose, Bld: 112 mg/dL — ABNORMAL HIGH (ref 65–99)
POTASSIUM: 4.2 mmol/L (ref 3.5–5.1)
Sodium: 136 mmol/L (ref 135–145)

## 2015-07-29 LAB — MAGNESIUM: Magnesium: 2 mg/dL (ref 1.7–2.4)

## 2015-07-29 LAB — PROTIME-INR
INR: 2.93 — ABNORMAL HIGH (ref 0.00–1.49)
PROTHROMBIN TIME: 30.1 s — AB (ref 11.6–15.2)

## 2015-07-29 MED ORDER — DOFETILIDE 250 MCG PO CAPS
250.0000 ug | ORAL_CAPSULE | Freq: Two times a day (BID) | ORAL | Status: DC
  Administered 2015-07-29 – 2015-07-31 (×5): 250 ug via ORAL
  Filled 2015-07-29 (×9): qty 1

## 2015-07-29 MED ORDER — SODIUM CHLORIDE 0.9 % IJ SOLN: 3.0000 mL | INTRAMUSCULAR | Status: DC | PRN

## 2015-07-29 MED ORDER — SODIUM CHLORIDE 0.9 % IV SOLN: INTRAVENOUS | Status: DC

## 2015-07-29 MED ORDER — SODIUM CHLORIDE 0.9 % IV SOLN: 250.0000 mL | INTRAVENOUS | Status: DC

## 2015-07-29 MED ORDER — SODIUM CHLORIDE 0.9 % IJ SOLN: 3.0000 mL | Freq: Two times a day (BID) | INTRAMUSCULAR | Status: DC

## 2015-07-29 NOTE — Progress Notes (Signed)
Paged Chanetta Marshall NP regarding pt EKG done 3 hrs post Tikosyn administration. Will continue to monitor pt.

## 2015-07-29 NOTE — Progress Notes (Signed)
ANTICOAGULATION CONSULT NOTE - Follow Up Consult  Pharmacy Consult for Coumadin Indication: atrial fibrillation  Allergies  Allergen Reactions  . Carbapenems   . Cephalosporins Other (See Comments)  . Ciprofloxacin     Pt can not take with amiodarone   . Decongestant [Oxymetazoline]     All decongestant - makes prostate swell  . Diltiazem Swelling    Left leg swelling  . Flagyl [Metronidazole] Nausea Only    Stomach ache  . Hctz [Hydrochlorothiazide]   . Levaquin [Levofloxacin In D5w] Other (See Comments)    unknown  . Lidocaine     Pt unsure of  . Penicillins Swelling  . Teline [Tetracycline] Swelling    Hospitalization for 2 weeks, prostate swelling  . Tetanus Toxoid Swelling    Swells arms, Please pre-medicate with benadryl.  Marland Kitchen Z-Pak [Azithromycin] Other (See Comments)    Pt can not take with Amiodarone   . Sulfonamide Derivatives Rash  . Toprol Xl [Metoprolol Tartrate] Itching and Rash    Patient Measurements: Height: 5\' 10"  (177.8 cm) Weight: 260 lb 14.4 oz (118.343 kg) (scale a) IBW/kg (Calculated) : 73 Heparin Dosing Weight:   Vital Signs: Temp: 97.3 F (36.3 C) (09/14 0530) Temp Source: Oral (09/14 0530) BP: 122/78 mmHg (09/14 0530) Pulse Rate: 111 (09/14 0530)  Labs:  Recent Labs  07/28/15 1027 07/28/15 1718 07/29/15 0542  HGB  --  14.0  --   HCT  --  43.0  --   PLT  --  167  --   LABPROT  --  29.8* 30.1*  INR  --  2.89* 2.93*  CREATININE 1.06  --  1.24    Estimated Creatinine Clearance: 57.1 mL/min (by C-G formula based on Cr of 1.24).   Medications:  Scheduled:  . dofetilide  250 mcg Oral BID  . escitalopram  10 mg Oral QHS  . furosemide  20 mg Oral Daily  . irbesartan  300 mg Oral Daily  . metoprolol succinate  25 mg Oral Daily  . metoprolol succinate  50 mg Oral QHS  . sodium chloride  3 mL Intravenous Q12H  . warfarin  2.5 mg Oral Once per day on Sun Tue Thu Sat  . warfarin  5 mg Oral Q M,W,F-1800  . Warfarin - Pharmacist Dosing  Inpatient   Does not apply q1800    Assessment: 81yo male with AFib, admitted for initiation of Tikosyn.  INR 2.93 this AM, therapeutic on home dose.  No bleeding noted.    Goal of Therapy:  INR 2-3 Monitor platelets by anticoagulation protocol: Yes   Plan:  Continue home dose of Coumadin- 2.5mg  daily except 5mg  MWF Daily INR, change to MWF if stable Monitor for s/s of bleeding Watch electrolytes & QTc with introduction of [place], [facility]

## 2015-07-29 NOTE — Progress Notes (Signed)
SUBJECTIVE: The patient is doing well today.  At this time, he denies chest pain, shortness of breath, or any new concerns.  Admitted 07/28/15 for Tikosyn load, Tikosyn held last night 2/2 prolonged QT reading on EKG  CURRENT MEDICATIONS: . dofetilide  500 mcg Oral BID  . escitalopram  10 mg Oral QHS  . furosemide  20 mg Oral Daily  . irbesartan  300 mg Oral Daily  . metoprolol succinate  25 mg Oral Daily  . metoprolol succinate  50 mg Oral QHS  . sodium chloride  3 mL Intravenous Q12H  . warfarin  2.5 mg Oral Once per day on Sun Tue Thu Sat  . warfarin  5 mg Oral Q M,W,F-1800  . Warfarin - Pharmacist Dosing Inpatient   Does not apply q1800      OBJECTIVE: Physical Exam: Filed Vitals:   07/28/15 2300 07/29/15 0021 07/29/15 0059 07/29/15 0530  BP:   118/70 122/78  Pulse: 110 87 111 111  Temp:   97.8 F (36.6 C) 97.3 F (36.3 C)  TempSrc:   Oral Oral  Resp: 12  18 18   Height:      Weight:    260 lb 14.4 oz (118.343 kg)  SpO2: 95%  94% 95%    Intake/Output Summary (Last 24 hours) at 07/29/15 9892 Last data filed at 07/29/15 0600  Gross per 24 hour  Intake    960 ml  Output   1275 ml  Net   -315 ml    Telemetry reveals atrial flutter  GEN- The patient is well appearing, alert and oriented x 3 today.   Head- normocephalic, atraumatic Eyes-  Sclera clear, conjunctiva pink Ears- hearing intact Oropharynx- clear Neck- supple  Lungs- Clear to ausculation bilaterally, normal work of breathing Heart- Tachycardic regular rate and rhythm  GI- soft, NT, ND, + BS Extremities- no clubbing, cyanosis, or edema Skin- diffuse macular rash over upper extremities and upper torso Psych- euthymic mood, full affect Neuro- strength and sensation are intact  LABS: Basic Metabolic Panel:  Recent Labs  07/28/15 1027 07/29/15 0542  NA 139 136  K 4.6 4.2  CL 106 99*  CO2 29 30  GLUCOSE 76 112*  BUN 12 15  CREATININE 1.06 1.24  CALCIUM 9.2 8.8*  MG 2.2 2.0    CBC:  Recent Labs  07/28/15 1718  WBC 6.7  HGB 14.0  HCT 43.0  MCV 92.5  PLT 167    ASSESSMENT AND PLAN:  Active Problems:   Persistent atrial fibrillation  1. Persistent atrial fibrillation/atrial flutter The patient has persistent atrial arrhythmias. He has failed medical therapy with Amiodarone (neuropathy) and Flecainide (did not maintain SR). Admitted for Tikosyn load Keep K>3.9, Mg >1.8 Monitor QTc after each dose If remains in AF on Thursday morning, will need DCCV Without lifestyle modification, likelihood of maintaining SR is low - discussed with patient today Will update echo this admission once in SR to re-evaluate LA size Start Tikosyn this admission - dose held last night 2/2 prolonged QT reading on EKG, QT is extremely difficult to assess while in flutter, but I think that it is consistent with EKG from office and stable to start Tikosyn. Will discuss with Dr Caryl Comes this morning.   2. Obesity Weight loss encouraged Will refer to AF nutrition program at discharge  3. HTN Stable No change required today  4. Diffuse rash The patient thinks is related to Toprol - will discontinue once in SR  5. Sleep apnea  Continue CPAP (importance of compliance discussed with patient today)  6. Depression Pt has been on Lexapro for a long time Discussed with Elberta Leatherwood, PharmD today, since QTc stable in the setting of ongoing lexapro use, ok to continue this medication at this time  Chanetta Marshall, NP 07/29/2015 6:44 AM

## 2015-07-29 NOTE — Progress Notes (Signed)
Pt can place on home CPAP by himself when ready.

## 2015-07-29 NOTE — Progress Notes (Signed)
BENEFIT CHECK FOR TIKOSYN  RE: Benefit check      Nia A Shealy CMA            Pt co pay for Tikosyn 250 mg twice a day  will be $80- prior auth not required

## 2015-07-29 NOTE — Progress Notes (Signed)
Paged Chanetta Marshall NP regarding pt having 2 orders for NS continuous fluids. Amber said to d/c orders and she will round on pt in the AM and order fluids if needed. Will continue to monitor pt.       Maurene Capes RN

## 2015-07-29 NOTE — Progress Notes (Addendum)
Spoke with Dr. Caryl Comes during rounds at [facility] regarding giving Dofetilide 250 mcg this AM. He personally reviewed EKG with Qtc <440 and wants Dofetilide to be given now. Will continue to monitor pt.  Maurene Capes RN

## 2015-07-30 ENCOUNTER — Encounter (HOSPITAL_COMMUNITY): Payer: Self-pay

## 2015-07-30 ENCOUNTER — Encounter (HOSPITAL_COMMUNITY)
Admission: AD | Disposition: A | Discharge status: Home / Self Care | Payer: Self-pay | Source: Ambulatory Visit | Attending: Cardiology

## 2015-07-30 ENCOUNTER — Inpatient Hospital Stay (HOSPITAL_COMMUNITY): Payer: Medicare Other | Admitting: Anesthesiology

## 2015-07-30 DIAGNOSIS — R21 Rash and other nonspecific skin eruption: Secondary | ICD-10-CM

## 2015-07-30 DIAGNOSIS — I4892 Unspecified atrial flutter: Secondary | ICD-10-CM

## 2015-07-30 DIAGNOSIS — F339 Major depressive disorder, recurrent, unspecified: Secondary | ICD-10-CM

## 2015-07-30 HISTORY — PX: CARDIOVERSION: SHX1299

## 2015-07-30 LAB — BASIC METABOLIC PANEL
ANION GAP: 5 (ref 5–15)
BUN: 13 mg/dL (ref 6–20)
CO2: 30 mmol/L (ref 22–32)
Calcium: 9.1 mg/dL (ref 8.9–10.3)
Chloride: 102 mmol/L (ref 101–111)
Creatinine, Ser: 1.09 mg/dL (ref 0.61–1.24)
GFR calc non Af Amer: >60 mL/min (ref >60)
Glucose, Bld: 110 mg/dL — ABNORMAL HIGH (ref 65–99)
POTASSIUM: 4.6 mmol/L (ref 3.5–5.1)
SODIUM: 137 mmol/L (ref 135–145)

## 2015-07-30 LAB — MAGNESIUM: MAGNESIUM: 2 mg/dL (ref 1.7–2.4)

## 2015-07-30 LAB — PROTIME-INR
INR: 2.79 — ABNORMAL HIGH (ref 0.00–1.49)
Prothrombin Time: 29 seconds — ABNORMAL HIGH (ref 11.6–15.2)

## 2015-07-30 SURGERY — CARDIOVERSION
Anesthesia: General

## 2015-07-30 MED ORDER — SODIUM CHLORIDE 0.9 % IJ SOLN: 3.0000 mL | INTRAMUSCULAR | Status: DC | PRN

## 2015-07-30 MED ORDER — SODIUM CHLORIDE 0.9 % IJ SOLN
3.0000 mL | Freq: Two times a day (BID) | INTRAMUSCULAR | Status: DC
  Administered 2015-07-30: 3 mL via INTRAVENOUS

## 2015-07-30 MED ORDER — SODIUM CHLORIDE 0.9 % IV SOLN: 250.0000 mL | INTRAVENOUS | Status: DC

## 2015-07-30 MED ORDER — SODIUM CHLORIDE 0.9 % IV SOLN
INTRAVENOUS | Status: DC
  Administered 2015-07-30: 10:00:00 via INTRAVENOUS

## 2015-07-30 MED ORDER — SODIUM CHLORIDE 0.9 % IV SOLN
INTRAVENOUS | Status: DC
  Administered 2015-07-30: 500 mL via INTRAVENOUS

## 2015-07-30 MED ORDER — SODIUM CHLORIDE 0.9 % IV SOLN
INTRAVENOUS | Status: DC | PRN
  Administered 2015-07-30: 14:00:00 via INTRAVENOUS

## 2015-07-30 MED ORDER — PROPOFOL 10 MG/ML IV BOLUS
INTRAVENOUS | Status: DC | PRN
  Administered 2015-07-30 (×3): 20 mg via INTRAVENOUS
  Administered 2015-07-30: 70 mg via INTRAVENOUS

## 2015-07-30 NOTE — CV Procedure (Signed)
    Electrical Cardioversion Procedure Note Chad [name] [id number] 01/24/1929  Procedure: Electrical Cardioversion Indications:  Atrial Flutter  Time Out: Verified patient identification, verified procedure,medications/allergies/relevent history reviewed, required imaging and test results available.  Performed  Procedure Details  The patient was NPO after midnight. Anesthesia was administered at the beside  by anesthesia with propofol.  Cardioversion was performed with synchronized biphasic defibrillation via AP pads with 120 joules.  1 attempt(s) were performed.  The patient converted to normal sinus rhythm. The patient tolerated the procedure well   IMPRESSION:  Successful cardioversion of atrial flutter. Sinus bradycardia (upper 40's to 50's) immediately post conversion. Occasional PAC's.  Will stop metoprolol.  Continue Tikosyn.    Chad [name] 07/30/2015, 2:32 PM

## 2015-07-30 NOTE — Anesthesia Preprocedure Evaluation (Addendum)
Anesthesia Evaluation  Patient identified by MRN, date of birth, ID band Patient awake    Reviewed: Allergy & Precautions, NPO status , Patient's Chart, lab work & pertinent test results, reviewed documented beta blocker date and time   Airway Mallampati: III  TM Distance: >3 FB Neck ROM: Full    Dental   Pulmonary sleep apnea and Continuous Positive Airway Pressure Ventilation , former smoker,  breath sounds clear to auscultation        Cardiovascular hypertension, Pt. on medications and Pt. on home beta blockers + dysrhythmias Atrial Fibrillation Rhythm:Regular Rate:Normal     Neuro/Psych negative neurological ROS     GI/Hepatic negative GI ROS, Neg liver ROS,   Endo/Other  Morbid obesity  Renal/GU negative Renal ROS     Musculoskeletal   Abdominal   Peds  Hematology negative hematology ROS (+)   Anesthesia Other Findings   Reproductive/Obstetrics                            Anesthesia Physical Anesthesia Plan  ASA: III  Anesthesia Plan: General   Post-op Pain Management:    Induction: Intravenous  Airway Management Planned: Mask and Natural Airway  Additional Equipment:   Intra-op Plan:   Post-operative Plan:   Informed Consent: I have reviewed the patients History and Physical, chart, labs and discussed the procedure including the risks, benefits and alternatives for the proposed anesthesia with the patient or authorized representative who has indicated his/her understanding and acceptance.   Dental advisory given  Plan Discussed with: CRNA  Anesthesia Plan Comments:        Anesthesia Quick Evaluation  

## 2015-07-30 NOTE — Progress Notes (Signed)
Patient stated he can put the CPAP on himself when he goes to bed.

## 2015-07-30 NOTE — Interval H&P Note (Signed)
History and Physical Interval Note:  07/30/2015 2:08 PM  Chad [name]  has presented today for surgery, with the diagnosis of afib  The various methods of treatment have been discussed with the patient and family. After consideration of risks, benefits and other options for treatment, the patient has consented to  Procedure(s): CARDIOVERSION (N/A) as a surgical intervention .  The patient's history has been reviewed, patient examined, no change in status, stable for surgery.  I have reviewed the patient's chart and labs.  Questions were answered to the patient's satisfaction.     [name], MARK

## 2015-07-30 NOTE — Transfer of Care (Signed)
Immediate Anesthesia Transfer of Care Note  Patient: [name]  Procedure(s) Performed: Procedure(s): CARDIOVERSION (N/A)  Patient Location: Endoscopy Unit  Anesthesia Type:General  Level of Consciousness: awake, alert  and oriented  Airway & Oxygen Therapy: Patient Spontanous Breathing and Patient connected to nasal cannula oxygen  Post-op Assessment: Report given to RN, Post -op Vital signs reviewed and stable and Patient moving all extremities X 4  Post vital signs: Reviewed and stable  Last Vitals:  Filed Vitals:   07/30/15 1339  BP: 144/103  Pulse: 104  Temp: 37 C  Resp: 17    Complications: No apparent anesthesia complications

## 2015-07-30 NOTE — Anesthesia Procedure Notes (Signed)
Procedure Name: MAC Date/Time: 07/30/2015 2:05 PM Performed by: Garrison Columbus T Pre-anesthesia Checklist: Patient identified, Emergency Drugs available, Suction available and Patient being monitored Patient Re-evaluated:Patient Re-evaluated prior to inductionOxygen Delivery Method: Ambu bag Intubation Type: IV induction Placement Confirmation: positive ETCO2 and breath sounds checked- equal and bilateral Dental Injury: Teeth and Oropharynx as per pre-operative assessment

## 2015-07-30 NOTE — Progress Notes (Signed)
SUBJECTIVE: The patient is doing well today.  At this time, he denies chest pain, shortness of breath,  hecomplains of fullness in Right ear assoc withecho sensation  Admitted 07/28/15 for Tikosyn load, Tikosyn held last night 2/2 prolonged QT reading on EKG  CURRENT MEDICATIONS: . dofetilide  250 mcg Oral BID  . escitalopram  10 mg Oral QHS  . furosemide  20 mg Oral Daily  . irbesartan  300 mg Oral Daily  . metoprolol succinate  25 mg Oral Daily  . metoprolol succinate  50 mg Oral QHS  . sodium chloride  3 mL Intravenous Q12H  . warfarin  2.5 mg Oral Once per day on Sun Tue Thu Sat  . warfarin  5 mg Oral Q M,W,F-1800  . Warfarin - Pharmacist Dosing Inpatient   Does not apply q1800   . sodium chloride      OBJECTIVE: Physical Exam: Filed Vitals:   07/29/15 1214 07/29/15 2054 07/29/15 2353 07/30/15 0516  BP: 132/86 117/74 130/88 121/78  Pulse: 102 112 103 105  Temp: 97.7 F (36.5 C) 97.9 F (36.6 C) 98 F (36.7 C) 97.5 F (36.4 C)  TempSrc: Oral Oral Oral Oral  Resp: 20 18 18 18   Height:      Weight:    256 lb 12.8 oz (116.484 kg)  SpO2: 96% 97% 98% 96%    Intake/Output Summary (Last 24 hours) at 07/30/15 0800 Last data filed at 07/30/15 0700  Gross per 24 hour  Intake   1142 ml  Output   3625 ml  Net  -2483 ml    Telemetry reveals atrial flutter  GEN- The patient is well appearing, alert and oriented x 3 today.   Head- normocephalic, atraumatic Eyes-  Sclera clear, conjunctiva pink Ears- hearing intact Oropharynx- clear Neck- supple  Lungs- Clear to ausculation bilaterally, normal work of breathing Heart- Tachycardic regular rate and rhythm  GI- soft, NT, ND, + BS Extremities- no clubbing, cyanosis, or edema Skin- diffuse macular rash over upper extremities and upper torso Psych- euthymic mood, full affect Neuro- strength and sensation are intact  LABS: Basic Metabolic Panel:  Recent Labs  07/28/15 1027 07/29/15 0542  NA 139 136  K 4.6 4.2    CL 106 99*  CO2 29 30  GLUCOSE 76 112*  BUN 12 15  CREATININE 1.06 1.24  CALCIUM 9.2 8.8*  MG 2.2 2.0   CBC:  Recent Labs  07/28/15 1718  WBC 6.7  HGB 14.0  HCT 43.0  MCV 92.5  PLT 167    ASSESSMENT AND PLAN:  Active Problems:   Persistent atrial fibrillation  1. Persistent atrial fibrillation/atrial flutter The patient has persistent atrial arrhythmias. He has failed medical therapy with Amiodarone (neuropathy) and Flecainide (did not maintain SR). Admitted for Tikosyn load Keep K>3.9, Mg >1.8 Monitor QTc after each dose If remains in AF on Thursday morning, will need DCCV Without lifestyle modification, likelihood of maintaining SR is low - discussed with patient today Will update echo this admission once in SR to re-evaluate LA size Start Tikosyn this admission - dose held last night 2/2 prolonged QT reading on EKG, QT is extremely difficult to assess while in flutter, but I think that it is consistent with EKG from office and stable to start Tikosyn. Will discuss with Dr Caryl Comes this morning.   2. Obesity Weight loss encouraged Will refer to AF nutrition program at discharge  3. HTN Stable No change required today  4. Diffuse rash The  patient thinks is related to Toprol - will discontinue once in SR  5. Sleep apnea Continue CPAP (importance of compliance discussed with patient today)  6. Depression Pt has been on Lexapro for a long time Discussed with Elberta Leatherwood, PharmD today, since QTc stable in the setting of ongoing lexapro use, ok to continue this medication at this time     07/30/2015 8:00 AM  Will proceed withcardioversion this am   i  Am not sure of the cause of the ear thing, if persists will need to look into it

## 2015-07-30 NOTE — Progress Notes (Signed)
History of MDRO is charted, however no culture or PCR results to support this. Patient's family is requesting infectious disease consultation for review. Noted for MD follow-up and will communicate to oncoming shift in AM.  Will continue contact precautions at this time.

## 2015-07-30 NOTE — H&P (View-Only) (Signed)
SUBJECTIVE: The patient is doing well today.  At this time, he denies chest pain, shortness of breath,  hecomplains of fullness in Right ear assoc withecho sensation  Admitted 07/28/15 for Tikosyn load, Tikosyn held last night 2/2 prolonged QT reading on EKG  CURRENT MEDICATIONS: . dofetilide  250 mcg Oral BID  . escitalopram  10 mg Oral QHS  . furosemide  20 mg Oral Daily  . irbesartan  300 mg Oral Daily  . metoprolol succinate  25 mg Oral Daily  . metoprolol succinate  50 mg Oral QHS  . sodium chloride  3 mL Intravenous Q12H  . warfarin  2.5 mg Oral Once per day on Sun Tue Thu Sat  . warfarin  5 mg Oral Q M,W,F-1800  . Warfarin - Pharmacist Dosing Inpatient   Does not apply q1800   . sodium chloride      OBJECTIVE: Physical Exam: Filed Vitals:   07/29/15 1214 07/29/15 2054 07/29/15 2353 07/30/15 0516  BP: 132/86 117/74 130/88 121/78  Pulse: 102 112 103 105  Temp: 97.7 F (36.5 C) 97.9 F (36.6 C) 98 F (36.7 C) 97.5 F (36.4 C)  TempSrc: Oral Oral Oral Oral  Resp: 20 18 18 18   Height:      Weight:    256 lb 12.8 oz (116.484 kg)  SpO2: 96% 97% 98% 96%    Intake/Output Summary (Last 24 hours) at 07/30/15 0800 Last data filed at 07/30/15 0700  Gross per 24 hour  Intake   1142 ml  Output   3625 ml  Net  -2483 ml    Telemetry reveals atrial flutter  GEN- The patient is well appearing, alert and oriented x 3 today.   Head- normocephalic, atraumatic Eyes-  Sclera clear, conjunctiva pink Ears- hearing intact Oropharynx- clear Neck- supple  Lungs- Clear to ausculation bilaterally, normal work of breathing Heart- Tachycardic regular rate and rhythm  GI- soft, NT, ND, + BS Extremities- no clubbing, cyanosis, or edema Skin- diffuse macular rash over upper extremities and upper torso Psych- euthymic mood, full affect Neuro- strength and sensation are intact  LABS: Basic Metabolic Panel:  Recent Labs  07/28/15 1027 07/29/15 0542  NA 139 136  K 4.6 4.2    CL 106 99*  CO2 29 30  GLUCOSE 76 112*  BUN 12 15  CREATININE 1.06 1.24  CALCIUM 9.2 8.8*  MG 2.2 2.0   CBC:  Recent Labs  07/28/15 1718  WBC 6.7  HGB 14.0  HCT 43.0  MCV 92.5  PLT 167    ASSESSMENT AND PLAN:  Active Problems:   Persistent atrial fibrillation  1. Persistent atrial fibrillation/atrial flutter The patient has persistent atrial arrhythmias. He has failed medical therapy with Amiodarone (neuropathy) and Flecainide (did not maintain SR). Admitted for Tikosyn load Keep K>3.9, Mg >1.8 Monitor QTc after each dose If remains in AF on Thursday morning, will need DCCV Without lifestyle modification, likelihood of maintaining SR is low - discussed with patient today Will update echo this admission once in SR to re-evaluate LA size Start Tikosyn this admission - dose held last night 2/2 prolonged QT reading on EKG, QT is extremely difficult to assess while in flutter, but I think that it is consistent with EKG from office and stable to start Tikosyn. Will discuss with Dr Caryl Comes this morning.   2. Obesity Weight loss encouraged Will refer to AF nutrition program at discharge  3. HTN Stable No change required today  4. Diffuse rash The  patient thinks is related to Toprol - will discontinue once in SR  5. Sleep apnea Continue CPAP (importance of compliance discussed with patient today)  6. Depression Pt has been on Lexapro for a long time Discussed with Elberta Leatherwood, PharmD today, since QTc stable in the setting of ongoing lexapro use, ok to continue this medication at this time     07/30/2015 8:00 AM  Will proceed withcardioversion this am   i  Am not sure of the cause of the ear thing, if persists will need to look into it

## 2015-07-30 NOTE — Progress Notes (Signed)
ANTICOAGULATION CONSULT NOTE - Follow Up Consult  Pharmacy Consult for Coumadin Indication: atrial fibrillation  Allergies  Allergen Reactions  . Carbapenems   . Cephalosporins Other (See Comments)  . Ciprofloxacin     Pt can not take with amiodarone   . Decongestant [Oxymetazoline]     All decongestant - makes prostate swell  . Diltiazem Swelling    Left leg swelling  . Flagyl [Metronidazole] Nausea Only    Stomach ache  . Hctz [Hydrochlorothiazide]   . Levaquin [Levofloxacin In D5w] Other (See Comments)    unknown  . Lidocaine     Pt unsure of  . Penicillins Swelling  . Teline [Tetracycline] Swelling    Hospitalization for 2 weeks, prostate swelling  . Tetanus Toxoid Swelling    Swells arms, Please pre-medicate with benadryl.  Marland Kitchen Z-Pak [Azithromycin] Other (See Comments)    Pt can not take with Amiodarone   . Sulfonamide Derivatives Rash  . Toprol Xl [Metoprolol Tartrate] Itching and Rash    Patient Measurements: Height: 5\' 10"  (177.8 cm) Weight: 256 lb 12.8 oz (116.484 kg) (scale a) IBW/kg (Calculated) : 73 Heparin Dosing Weight:   Vital Signs: Temp: 97.5 F (36.4 C) (09/15 0516) Temp Source: Oral (09/15 0516) BP: 121/78 mmHg (09/15 0516) Pulse Rate: 105 (09/15 0516)  Labs:  Recent Labs  07/28/15 1027 07/28/15 1718 07/29/15 0542 07/30/15 0423 07/30/15 0715  HGB  --  14.0  --   --   --   HCT  --  43.0  --   --   --   PLT  --  167  --   --   --   LABPROT  --  29.8* 30.1* 29.0*  --   INR  --  2.89* 2.93* 2.79*  --   CREATININE 1.06  --  1.24  --  1.09    Estimated Creatinine Clearance: 64.5 mL/min (by C-G formula based on Cr of 1.09).   Medications:  Scheduled:  . dofetilide  250 mcg Oral BID  . escitalopram  10 mg Oral QHS  . furosemide  20 mg Oral Daily  . irbesartan  300 mg Oral Daily  . metoprolol succinate  25 mg Oral Daily  . metoprolol succinate  50 mg Oral QHS  . sodium chloride  3 mL Intravenous Q12H  . sodium chloride  3 mL  Intravenous Q12H  . warfarin  2.5 mg Oral Once per day on Sun Tue Thu Sat  . warfarin  5 mg Oral Q M,W,F-1800  . Warfarin - Pharmacist Dosing Inpatient   Does not apply q1800    Assessment: 81yo male with AFib, admitted for initiation of Tikosyn. On warfarin PTA. INR remains therapeutic on home dose at  2.79 this AM.  CBC stable. No s/s of bleed  Goal of Therapy:  INR 2-3 Monitor platelets by anticoagulation protocol: Yes   Plan:  Continue home regimen of warfarin 2.5mg  daily, exc 5mg  MWF Monitor daily INR, CBC, s/s of bleed  Elenor Quinones, PharmD Clinical Pharmacist Pager [phone] 07/30/2015 8:13 AM

## 2015-07-30 NOTE — Anesthesia Postprocedure Evaluation (Signed)
  Anesthesia Post-op Note  Patient: Chad [name]  Procedure(s) Performed: Procedure(s): CARDIOVERSION (N/A)  Patient Location: Endoscopy Unit  Anesthesia Type:General  Level of Consciousness: awake, alert  and oriented  Airway and Oxygen Therapy: Patient Spontanous Breathing and Patient connected to nasal cannula oxygen  Post-op Pain: none  Post-op Assessment: Post-op Vital signs reviewed, Patient's Cardiovascular Status Stable, Respiratory Function Stable, Patent Airway and No signs of Nausea or vomiting              Post-op Vital Signs: Reviewed and stable  Last Vitals:  Filed Vitals:   07/30/15 1339  BP: 144/103  Pulse: 104  Temp: 37 C  Resp: 17    Complications: No apparent anesthesia complications

## 2015-07-31 ENCOUNTER — Encounter (HOSPITAL_COMMUNITY): Payer: Self-pay | Admitting: Cardiology

## 2015-07-31 DIAGNOSIS — R001 Bradycardia, unspecified: Secondary | ICD-10-CM

## 2015-07-31 DIAGNOSIS — I480 Paroxysmal atrial fibrillation: Secondary | ICD-10-CM

## 2015-07-31 LAB — CBC
HCT: 44.8 % (ref 39.0–52.0)
Hemoglobin: 14.6 g/dL (ref 13.0–17.0)
MCH: 30.1 pg (ref 26.0–34.0)
MCHC: 32.6 g/dL (ref 30.0–36.0)
MCV: 92.4 fL (ref 78.0–100.0)
Platelets: 164 10*3/uL (ref 150–400)
RBC: 4.85 MIL/uL (ref 4.22–5.81)
RDW: 15.4 % (ref 11.5–15.5)
WBC: 7.3 10*3/uL (ref 4.0–10.5)

## 2015-07-31 LAB — BASIC METABOLIC PANEL
Anion gap: 6 (ref 5–15)
BUN: 17 mg/dL (ref 6–20)
CO2: 30 mmol/L (ref 22–32)
Calcium: 9 mg/dL (ref 8.9–10.3)
Chloride: 101 mmol/L (ref 101–111)
Creatinine, Ser: 1.28 mg/dL — ABNORMAL HIGH (ref 0.61–1.24)
GFR calc Af Amer: 58 mL/min — ABNORMAL LOW (ref >60)
GFR calc non Af Amer: 50 mL/min — ABNORMAL LOW (ref >60)
Glucose, Bld: 104 mg/dL — ABNORMAL HIGH (ref 65–99)
Potassium: 4.4 mmol/L (ref 3.5–5.1)
Sodium: 137 mmol/L (ref 135–145)

## 2015-07-31 LAB — MAGNESIUM: Magnesium: 2 mg/dL (ref 1.7–2.4)

## 2015-07-31 LAB — PROTIME-INR
INR: 3.04 — ABNORMAL HIGH (ref 0.00–1.49)
Prothrombin Time: 30.9 seconds — ABNORMAL HIGH (ref 11.6–15.2)

## 2015-07-31 MED ORDER — DOFETILIDE 250 MCG PO CAPS: 250.0000 ug | ORAL_CAPSULE | Freq: Two times a day (BID) | ORAL | Status: DC

## 2015-07-31 MED ORDER — METOPROLOL SUCCINATE ER 25 MG PO TB24: 25.0000 mg | ORAL_TABLET | Freq: Every day | ORAL | Status: DC

## 2015-07-31 MED ORDER — WARFARIN SODIUM 2.5 MG PO TABS: 2.5000 mg | ORAL_TABLET | Freq: Once | ORAL | Status: DC

## 2015-07-31 NOTE — Progress Notes (Signed)
Hard script sent to [facility] for a week supply of Tikosyn using the [facility] [place] for discharge home today. Mindi Slicker [facility] [phone]

## 2015-07-31 NOTE — Progress Notes (Signed)
    SUBJECTIVE: The patient is doing well today.  At this time, he denies chest pain, shortness of breath,  hecomplains of fullness in Right ear assoc withecho sensation R ear fullness fully resolved   Admitted 07/28/15 for Tikosyn load,  DCCV yesterday Sinus brady overnight No symptoms  \ CURRENT MEDICATIONS: . dofetilide  250 mcg Oral BID  . escitalopram  10 mg Oral QHS  . furosemide  20 mg Oral Daily  . irbesartan  300 mg Oral Daily  . metoprolol succinate  25 mg Oral Daily  . sodium chloride  3 mL Intravenous Q12H  . warfarin  2.5 mg Oral Once per day on Sun Tue Thu Sat  . warfarin  5 mg Oral Q M,W,F-1800  . Warfarin - Pharmacist Dosing Inpatient   Does not apply q1800      OBJECTIVE: Physical Exam: Filed Vitals:   07/30/15 1500 07/30/15 1516 07/30/15 2025 07/31/15 0457  BP:  105/67 90/55 105/70  Pulse: 50  107 56  Temp:  98.2 F (36.8 C) 98 F (36.7 C) 97.8 F (36.6 C)  TempSrc:   Oral Oral  Resp: 16   18  Height:      Weight:    257 lb 12.8 oz (116.937 kg)  SpO2: 96% 97% 95% 95%    Intake/Output Summary (Last 24 hours) at 07/31/15 0913 Last data filed at 07/31/15 0850  Gross per 24 hour  Intake    925 ml  Output    975 ml  Net    -50 ml    Telemetry reveals atrial flutter  GEN- The patient is well appearing, alert and oriented x 3 today.   Head- normocephalic, atraumatic Eyes-  Sclera clear, conjunctiva pink Ears- hearing intact Oropharynx- clear Neck- supple  Lungs- Clear to ausculation bilaterally, normal work of breathing Heart- slow and regular rate and rhythm  GI- soft, NT, ND, + BS Extremities- no clubbing, cyanosis, or edema Skin- diffuse macular rash over upper extremities and upper torso Psych- euthymic mood, full affect Neuro- strength and sensation are intact  LABS: Basic Metabolic Panel:  Recent Labs  07/30/15 0715 07/31/15 0415  NA 137 137  K 4.6 4.4  CL 102 101  CO2 30 30  GLUCOSE 110* 104*  BUN 13 17  CREATININE 1.09  1.28*  CALCIUM 9.1 9.0  MG 2.0 2.0   CBC:  Recent Labs  07/28/15 1718 07/31/15 0415  WBC 6.7 7.3  HGB 14.0 14.6  HCT 43.0 44.8  MCV 92.5 92.4  PLT 167 164    ASSESSMENT AND PLAN:  Active Problems:   Persistent atrial fibrillation  1. Persistent atrial fibrillation/atrial flutter The patient has persistent atrial arrhythmias. He has failed medical therapy with Amiodarone (neuropathy) and Flecainide (did not maintain SR). Admitted for Tikosyn load Keep K>3.9, Mg >1.8 Monitor QTc after each dose  2. Sinus Brady  Holding sinus post cardioverson Some bradycardia Will stop metoprolol Ambulate  Discharge  followup per protocol

## 2015-07-31 NOTE — Discharge Summary (Signed)
ELECTROPHYSIOLOGY PROCEDURE DISCHARGE SUMMARY    Patient ID: Chad [name],  MRN: [id number], DOB/AGE: 79-Mar-1930 79 y.o.  Admit date: 07/28/2015 Discharge date: 07/31/2015  Primary Care Physician: Purvis Kilts, MD Primary Cardiologist: H[place]  Primary Discharge Diagnosis:  1.  Persistent atrial fibrillation status post Tikosyn loading this admission  Secondary Discharge Diagnosis:  1.  Hypertension 2.  Sleep apnea - on CPAP 3.  Obesity  Allergies  Allergen Reactions  . Carbapenems   . Cephalosporins Other (See Comments)  . Ciprofloxacin     Pt can not take with amiodarone   . Decongestant [Oxymetazoline]     All decongestant - makes prostate swell  . Diltiazem Swelling    Left leg swelling  . Flagyl [Metronidazole] Nausea Only    Stomach ache  . Hctz [Hydrochlorothiazide]   . Levaquin [Levofloxacin In D5w] Other (See Comments)    unknown  . Lidocaine     Pt unsure of  . Penicillins Swelling  . Teline [Tetracycline] Swelling    Hospitalization for 2 weeks, prostate swelling  . Tetanus Toxoid Swelling    Swells arms, Please pre-medicate with benadryl.  Marland Kitchen Z-Pak [Azithromycin] Other (See Comments)    Pt can not take with Amiodarone   . Sulfonamide Derivatives Rash  . Toprol Xl [Metoprolol Tartrate] Itching and Rash     Procedures This Admission:  1.  Tikosyn loading 2.  Direct current cardioversion on 07/30/15 by Dr Marlou Porch which successfully restored SR.  There were no early apparent complications.   Brief HPI: Chad [name] is a 81 y.o. male with a past medical history as noted above. He has had persistent atrial fibrillation and tikosyn was offered as a treatment option. Risks, benefits, and alternatives to Tikosyn were reviewed with the patient who wished to proceed.    Hospital Course:  The patient was admitted and Tikosyn was initiated.  Renal function and electrolytes were followed during the hospitalization.  Their QTc remained stable.  On  07/30/15 they underwent direct current cardioversion which restored sinus rhythm.  They were monitored until discharge on telemetry which demonstrated sinus rhythm.  On the day of discharge, they were examined by Dr Caryl Comes who considered them stable for discharge to home.  Follow-up has been arranged with [facility] pharmacists in 1 week and with Dr Percival Spanish in 12 weeks.   This patients CHA2DS2-VASc Score and unadjusted Ischemic Stroke Rate (% per year) is equal to 3.2 % stroke rate/year from a score of 3 Above score calculated as 1 point each if present [CHF, HTN, DM, Vascular=MI/PAD/Aortic Plaque, Age if 65-74, or Male] Above score calculated as 2 points each if present [Age > 75, or Stroke/TIA/TE]   Physical Exam: Filed Vitals:   07/30/15 1500 07/30/15 1516 07/30/15 2025 07/31/15 0457  BP:  105/67 90/55 105/70  Pulse: 50  107 56  Temp:  98.2 F (36.8 C) 98 F (36.7 C) 97.8 F (36.6 C)  TempSrc:   Oral Oral  Resp: 16   18  Height:      Weight:    257 lb 12.8 oz (116.937 kg)  SpO2: 96% 97% 95% 95%    Labs:   Lab Results  Component Value Date   WBC 7.3 07/31/2015   HGB 14.6 07/31/2015   HCT 44.8 07/31/2015   MCV 92.4 07/31/2015   PLT 164 07/31/2015     Recent Labs Lab 07/31/15 0415  NA 137  K 4.4  CL 101  CO2 30  BUN  17  CREATININE 1.28*  CALCIUM 9.0  GLUCOSE 104*     Discharge Medications:    Medication List    STOP taking these medications        flecainide 50 MG tablet  Commonly known as:  TAMBOCOR     metoprolol succinate 50 MG 24 hr tablet  Commonly known as:  TOPROL-XL      TAKE these medications        Calcium-Magnesium 500-250 MG Tabs  Take 1 tablet by mouth daily.     CENTRUM SILVER PO  Take 1 tablet by mouth daily.     Cholecalciferol 1000 UNITS capsule  Take 2,000 Units by mouth 2 (two) times daily.     COUMADIN 5 MG tablet  Generic drug:  warfarin  Take 1 tablet by mouth daily or as directed by coumadin clinic     dofetilide 250 MCG  capsule  Commonly known as:  TIKOSYN  Take 1 capsule (250 mcg total) by mouth 2 (two) times daily.     EQL COQ10 100 MG capsule  Generic drug:  Coenzyme Q10  Take 200 mg by mouth daily.     escitalopram 10 MG tablet  Commonly known as:  LEXAPRO  Take 10 mg by mouth at bedtime.     furosemide 20 MG tablet  Commonly known as:  LASIX  Take 1 tablet (20 mg total) by mouth daily.     irbesartan 300 MG tablet  Commonly known as:  AVAPRO  Take 1 tablet (300 mg total) by mouth daily.     OXYGEN  Inhale 2 L into the lungs at bedtime. Uses with CPAP machine     VITAMIN B 12 PO  Take 500 mcg by mouth 2 (two) times daily.        Disposition:  Discharge Instructions    Diet - low sodium heart healthy    Complete by:  As directed      Increase activity slowly    Complete by:  As directed           Follow-up Information    Follow up with [place] On 08/07/2015.   Why:  at 9:30AM   Contact information:   [place] [phone]      Follow up with [place] On 08/28/2015.   Why:  at 9:30AM   Contact information:   [place] [phone]      Follow up with Minus Breeding, MD On 10/30/2015.   Specialty:  Cardiology   Why:  at [facility] information:   [address] [place] [address] [phone]       Follow up with [facility] In 1 week.   Why:  coumadin check - please call the office to schedule. I will also send them a message asking them to arrange   Contact information:   [address] [place] [address] [phone]      Duration of Discharge Encounter: Greater than 30 minutes including physician time.  Signed, Chanetta Marshall, NP 07/31/2015 9:21 AM

## 2015-07-31 NOTE — Progress Notes (Signed)
ANTICOAGULATION CONSULT NOTE - Follow Up Consult  Pharmacy Consult for Coumadin Indication: atrial fibrillation  Allergies  Allergen Reactions  . Carbapenems   . Cephalosporins Other (See Comments)  . Ciprofloxacin     Pt can not take with amiodarone   . Decongestant [Oxymetazoline]     All decongestant - makes prostate swell  . Diltiazem Swelling    Left leg swelling  . Flagyl [Metronidazole] Nausea Only    Stomach ache  . Hctz [Hydrochlorothiazide]   . Levaquin [Levofloxacin In D5w] Other (See Comments)    unknown  . Lidocaine     Pt unsure of  . Penicillins Swelling  . Teline [Tetracycline] Swelling    Hospitalization for 2 weeks, prostate swelling  . Tetanus Toxoid Swelling    Swells arms, Please pre-medicate with benadryl.  Marland Kitchen Z-Pak [Azithromycin] Other (See Comments)    Pt can not take with Amiodarone   . Sulfonamide Derivatives Rash  . Toprol Xl [Metoprolol Tartrate] Itching and Rash    Patient Measurements: Height: 5\' 10"  (177.8 cm) Weight: 257 lb 12.8 oz (116.937 kg) (scale a) IBW/kg (Calculated) : 73 Heparin Dosing Weight:   Vital Signs: Temp: 97.8 F (36.6 C) (09/16 0457) Temp Source: Oral (09/16 0457) BP: 105/70 mmHg (09/16 0457) Pulse Rate: 56 (09/16 0457)  Labs:  Recent Labs  07/28/15 1718 07/29/15 0542 07/30/15 0423 07/30/15 0715 07/31/15 0415  HGB 14.0  --   --   --  14.6  HCT 43.0  --   --   --  44.8  PLT 167  --   --   --  164  LABPROT 29.8* 30.1* 29.0*  --  30.9*  INR 2.89* 2.93* 2.79*  --  3.04*  CREATININE  --  1.24  --  1.09 1.28*    Estimated Creatinine Clearance: 55.1 mL/min (by C-G formula based on Cr of 1.28).   Medications:  Scheduled:  . dofetilide  250 mcg Oral BID  . escitalopram  10 mg Oral QHS  . furosemide  20 mg Oral Daily  . irbesartan  300 mg Oral Daily  . metoprolol succinate  25 mg Oral Daily  . sodium chloride  3 mL Intravenous Q12H  . warfarin  2.5 mg Oral Once per day on Sun Tue Thu Sat  . warfarin  5  mg Oral Q M,W,F-1800  . Warfarin - Pharmacist Dosing Inpatient   Does not apply q1800    Assessment: 81yo male with AFib, admitted for initiation of Tikosyn. On warfarin PTA. INR bumped up to slightly supratherapeutic at 3.04 today. CBC stable. No s/s of bleed.  PTA Warfarin 5 mg MWF and 2.5 AOD  Goal of Therapy:  INR 2-3 Monitor platelets by anticoagulation protocol: Yes   Plan:  Give coumadin 2.5mg  PO x 1 tonight Monitor daily INR, CBC, s/s of bleed  Elenor Quinones, PharmD Clinical Pharmacist Pager [phone] 07/31/2015 10:18 AM

## 2015-08-03 ENCOUNTER — Telehealth: Payer: Self-pay | Admitting: Cardiology

## 2015-08-03 ENCOUNTER — Ambulatory Visit: Payer: Medicare Other | Admitting: Pharmacist Clinician (PhC)/ Clinical Pharmacy Specialist

## 2015-08-03 NOTE — Telephone Encounter (Signed)
Patient understands follow up appointment at Afib clinic with Roderic Palau.  Understands medications  Confirmed with patient that at DC he was to stop metoprolol.  Patient was not sure why his family had to gown up to go in room.  I see there was a documented rash but could not find any other reason

## 2015-08-03 NOTE — Telephone Encounter (Signed)
D/c phone call . Appt on 08/07/15 at 9:30am w/ Roderic Palau at the [facility].  Thanks

## 2015-08-03 NOTE — Telephone Encounter (Signed)
Patient contacted regarding discharge from [facility] on 07/31/15.  Patient understands to follow up with provider Roderic Palau, NP on 08/07/15 at 0930 at [facility]. Patient understands discharge instructions? YES  Patient understands medications and regiment? YES  Patient understands to bring all medications to this visit? YES  Patient reports his HR was 120bpm yesterday but is now around 40-50bpm. He has no complaints.

## 2015-08-05 ENCOUNTER — Ambulatory Visit (INDEPENDENT_AMBULATORY_CARE_PROVIDER_SITE_OTHER): Payer: Medicare Other | Admitting: *Deleted

## 2015-08-05 DIAGNOSIS — Z5181 Encounter for therapeutic drug level monitoring: Secondary | ICD-10-CM | POA: Diagnosis not present

## 2015-08-05 DIAGNOSIS — I481 Persistent atrial fibrillation: Secondary | ICD-10-CM | POA: Diagnosis not present

## 2015-08-05 DIAGNOSIS — Z7901 Long term (current) use of anticoagulants: Secondary | ICD-10-CM | POA: Diagnosis not present

## 2015-08-05 DIAGNOSIS — I4891 Unspecified atrial fibrillation: Secondary | ICD-10-CM | POA: Diagnosis not present

## 2015-08-05 DIAGNOSIS — I4819 Other persistent atrial fibrillation: Secondary | ICD-10-CM

## 2015-08-05 LAB — POCT INR: INR: 2

## 2015-08-07 ENCOUNTER — Ambulatory Visit (HOSPITAL_COMMUNITY)
Admit: 2015-08-07 | Discharge: 2015-08-07 | Disposition: A | Discharge status: Home / Self Care | Payer: Medicare Other | Source: Ambulatory Visit | Attending: Nurse Practitioner | Admitting: Nurse Practitioner

## 2015-08-07 ENCOUNTER — Encounter (HOSPITAL_COMMUNITY): Payer: Self-pay | Admitting: Nurse Practitioner

## 2015-08-07 VITALS — BP 118/66 | HR 78 | Ht 70.0 in | Wt 259.6 lb

## 2015-08-07 DIAGNOSIS — I481 Persistent atrial fibrillation: Secondary | ICD-10-CM | POA: Diagnosis not present

## 2015-08-07 DIAGNOSIS — R21 Rash and other nonspecific skin eruption: Secondary | ICD-10-CM | POA: Insufficient documentation

## 2015-08-07 DIAGNOSIS — I4819 Other persistent atrial fibrillation: Secondary | ICD-10-CM

## 2015-08-07 NOTE — Progress Notes (Signed)
Patient ID: Chad [name], male   DOB: [birth year], 81 y.o.   MRN: [id number]     Primary Care Physician: Purvis Kilts, MD Referring Physician: [facility] f/u   Chad [name] is a 81 y.o. male with a h/o persistent afib in past failing flecainide and amiodarone in the past was stopped due to neuropathy. He was recently started on tikosyn and is in the afib clinc for f/u. He is in SR with PAC's. He has been feeling better. He has a rash that preceded tikosyn and pt thought it occurred around the time metoprolol was started. BB was stopped on d/c but no improvement in rash yet. He has received his supply of tikosyn from drug store for the next 3 months and is aware not to miss any doses of drug.  Today, he denies symptoms of palpitations, chest pain, shortness of breath, orthopnea, PND, lower extremity edema, dizziness, presyncope, syncope, or neurologic sequela. The patient is tolerating medications without difficulties and is otherwise without complaint today.   Past Medical History  Diagnosis Date  . Persistent atrial fibrillation        . Hypertension   . Hypoglycemia   . Sensory ataxia   . Sleep apnea     CPAP  . Cancer 2010    bladder   Past Surgical History  Procedure Laterality Date  . Lumbar disc surgery  1991  . Cardioversion N/A 04/23/2015    Procedure: CARDIOVERSION;  Surgeon: [name], MD;  Location: [facility] ENDOSCOPY;  Service: Cardiovascular;  Laterality: N/A;  . Cardioversion N/A 07/30/2015    Procedure: CARDIOVERSION;  Surgeon: [name], MD;  Location: [facility] ENDOSCOPY;  Service: Cardiovascular;  Laterality: N/A;    Current Outpatient Prescriptions  Medication Sig Dispense Refill  . Calcium-Magnesium 500-250 MG TABS Take 1 tablet by mouth daily.      . Cholecalciferol 1000 UNIT capsule Take 2,000 Units by mouth 2 (two) times daily.     . Coenzyme Q10 (EQL COQ10) 100 MG capsule Take 200 mg by mouth daily.     Marland Kitchen COUMADIN 5 MG tablet Take 1 tablet by mouth daily or as  directed by coumadin clinic (Patient taking differently: Take 5 mg by mouth daily at 6 PM. Take 5 mg Monday, Wednesday, and Friday and 2.5 all other days.) 30 tablet 3  . Cyanocobalamin (VITAMIN B 12 PO) Take 500 mcg by mouth 2 (two) times daily.     Marland Kitchen dofetilide (TIKOSYN) 250 MCG capsule Take 1 capsule (250 mcg total) by mouth 2 (two) times daily. 180 capsule 3  . escitalopram (LEXAPRO) 10 MG tablet Take 10 mg by mouth at bedtime.     . furosemide (LASIX) 20 MG tablet Take 1 tablet (20 mg total) by mouth daily. 30 tablet 6  . irbesartan (AVAPRO) 300 MG tablet Take 1 tablet (300 mg total) by mouth daily. 90 tablet 3  . Multiple Vitamins-Minerals (CENTRUM SILVER PO) Take 1 tablet by mouth daily.      . OXYGEN Inhale 2 L into the lungs at bedtime. Uses with CPAP machine     No current facility-administered medications for this encounter.    Allergies  Allergen Reactions  . Carbapenems   . Cephalosporins Other (See Comments)  . Ciprofloxacin     Pt can not take with amiodarone   . Decongestant [Oxymetazoline]     All decongestant - makes prostate swell  . Diltiazem Swelling    Left leg swelling  . Flagyl [Metronidazole] Nausea  Only    Stomach ache  . Hctz [Hydrochlorothiazide]   . Levaquin [Levofloxacin In D5w] Other (See Comments)    unknown  . Lidocaine     Pt unsure of  . Penicillins Swelling  . Teline [Tetracycline] Swelling    Hospitalization for 2 weeks, prostate swelling  . Tetanus Toxoid Swelling    Swells arms, Please pre-medicate with benadryl.  Marland Kitchen Z-Pak [Azithromycin] Other (See Comments)    Pt can not take with Amiodarone   . Sulfonamide Derivatives Rash  . Toprol Xl [Metoprolol Tartrate] Itching and Rash    Social History   Social History  . Marital Status: Married    Spouse Name: [name]  . Number of Children: 2  . Years of Education: 16   Occupational History  .     Social History Main Topics  . Smoking status: Former Smoker    Types: Cigarettes    Quit  date: 12/13/1970  . Smokeless tobacco: Never Used  . Alcohol Use: No  . Drug Use: No  . Sexual Activity: Not on file   Other Topics Concern  . 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   Patient has two children.   Patient is retired.   Patient has a college education.   Patient is right-handed.   Patient does not drink any caffeine.    Family History  Problem Relation Age of Onset  . Heart failure Father   . Hypertension Father   . Dementia Mother     ROS- All systems are reviewed and negative except as per the HPI above  Physical Exam: Filed Vitals:   08/07/15 0938  BP: 118/66  Pulse: 78  Height: 5\' 10"  (1.778 m)  Weight: 259 lb 9.6 oz (117.754 kg)    GEN- The patient is well appearing, alert and oriented x 3 today.   Head- normocephalic, atraumatic Eyes-  Sclera clear, conjunctiva pink Ears- hearing intact Oropharynx- clear Neck- supple, no JVP Lymph- no cervical lymphadenopathy Lungs- Clear to ausculation bilaterally, normal work of breathing Heart- Regular rate and rhythm, no murmurs, rubs or gallops, PMI not laterally displaced GI- soft, NT, ND, + BS Extremities- no clubbing, cyanosis, or edema MS- no significant deformity or atrophy Skin- no rash or lesion Psych- euthymic mood, full affect Neuro- strength and sensation are intact  EKG- SR with pac's, PR int 160 ms, QRS 90 ms, QTc 469 ms Epic records refiewed  Assessment and Plan: 1. Persistent afib  Recent loading of tikosyn and maintaining SR Continue tikosyn 250 mg bid Continue coumadin  Bmet/mag today  2. Rash Continue off metoprolol for improvement  Sees dermatologist again in December   F/u with coumadin clinic 10/3 F/u with Afib clinic 10/14

## 2015-08-07 NOTE — Patient Instructions (Signed)

## 2015-08-17 ENCOUNTER — Ambulatory Visit (INDEPENDENT_AMBULATORY_CARE_PROVIDER_SITE_OTHER): Payer: Medicare Other | Admitting: *Deleted

## 2015-08-17 DIAGNOSIS — I4819 Other persistent atrial fibrillation: Secondary | ICD-10-CM

## 2015-08-17 DIAGNOSIS — I4891 Unspecified atrial fibrillation: Secondary | ICD-10-CM

## 2015-08-17 DIAGNOSIS — Z7901 Long term (current) use of anticoagulants: Secondary | ICD-10-CM | POA: Diagnosis not present

## 2015-08-17 DIAGNOSIS — Z5181 Encounter for therapeutic drug level monitoring: Secondary | ICD-10-CM

## 2015-08-17 DIAGNOSIS — I481 Persistent atrial fibrillation: Secondary | ICD-10-CM

## 2015-08-17 LAB — POCT INR: INR: 2.2

## 2015-08-26 ENCOUNTER — Ambulatory Visit (INDEPENDENT_AMBULATORY_CARE_PROVIDER_SITE_OTHER): Payer: Medicare Other | Admitting: *Deleted

## 2015-08-26 DIAGNOSIS — I4891 Unspecified atrial fibrillation: Secondary | ICD-10-CM

## 2015-08-26 DIAGNOSIS — I4819 Other persistent atrial fibrillation: Secondary | ICD-10-CM

## 2015-08-26 DIAGNOSIS — Z5181 Encounter for therapeutic drug level monitoring: Secondary | ICD-10-CM | POA: Diagnosis not present

## 2015-08-26 DIAGNOSIS — Z7901 Long term (current) use of anticoagulants: Secondary | ICD-10-CM

## 2015-08-26 DIAGNOSIS — I481 Persistent atrial fibrillation: Secondary | ICD-10-CM | POA: Diagnosis not present

## 2015-08-26 LAB — POCT INR: INR: 2.4

## 2015-08-28 ENCOUNTER — Encounter (HOSPITAL_COMMUNITY): Payer: Self-pay | Admitting: Nurse Practitioner

## 2015-08-28 ENCOUNTER — Ambulatory Visit (HOSPITAL_COMMUNITY)
Admit: 2015-08-28 | Discharge: 2015-08-28 | Disposition: A | Discharge status: Home / Self Care | Payer: Medicare Other | Source: Ambulatory Visit | Attending: Nurse Practitioner | Admitting: Nurse Practitioner

## 2015-08-28 VITALS — BP 126/58 | HR 61 | Ht 69.0 in | Wt 261.0 lb

## 2015-08-28 DIAGNOSIS — R21 Rash and other nonspecific skin eruption: Secondary | ICD-10-CM | POA: Insufficient documentation

## 2015-08-28 DIAGNOSIS — G629 Polyneuropathy, unspecified: Secondary | ICD-10-CM | POA: Insufficient documentation

## 2015-08-28 DIAGNOSIS — I481 Persistent atrial fibrillation: Secondary | ICD-10-CM | POA: Diagnosis not present

## 2015-08-28 DIAGNOSIS — I4819 Other persistent atrial fibrillation: Secondary | ICD-10-CM

## 2015-08-28 LAB — BASIC METABOLIC PANEL
ANION GAP: 6 (ref 5–15)
BUN: 14 mg/dL (ref 6–20)
CHLORIDE: 104 mmol/L (ref 101–111)
CO2: 30 mmol/L (ref 22–32)
Calcium: 9.6 mg/dL (ref 8.9–10.3)
Creatinine, Ser: 1.13 mg/dL (ref 0.61–1.24)
GFR calc non Af Amer: 58 mL/min — ABNORMAL LOW (ref >60)
Glucose, Bld: 105 mg/dL — ABNORMAL HIGH (ref 65–99)
POTASSIUM: 4.1 mmol/L (ref 3.5–5.1)
Sodium: 140 mmol/L (ref 135–145)

## 2015-08-28 LAB — MAGNESIUM: Magnesium: 2 mg/dL (ref 1.7–2.4)

## 2015-08-28 NOTE — Progress Notes (Signed)
Patient ID: Chad [name], male   DOB: 1929/06/09, 81 y.o.   MRN: [id number]      Primary Care Physician: Purvis Kilts, MD Referring Physician: [facility] f/u Cardiologist: Dr. Chana Bode Chad [name] is a 81 y.o. male with a h/o persistent afib in past failing flecainide and amiodarone in the past was stopped due to neuropathy. He was recently started on tikosyn and is in the afib clinc for f/u. He is in SR with PAC's. He has been feeling better. He has not noticed any irregular heart beat. He has a rash that preceded tikosyn and pt thought it occurred around the time metoprolol was started. BB was stopped on d/c but no improvement in rash yet. Is pending f/u with dermatologist. He has received his supply of tikosyn from drug store for the next 3 months and is aware not to miss any doses of drug. He is c/o of some increased sensation of neuropathy in his lower legs, like a band around his knees, and was wondering if the cardioversion or tikosyn could be contributing, which I do not believe to be the case. No significant LLE.  Today, he denies symptoms of palpitations, chest pain, shortness of breath, orthopnea, PND, lower extremity edema, dizziness, presyncope, syncope, or neurologic sequela. The patient is tolerating medications without difficulties and is otherwise without complaint today.   Past Medical History  Diagnosis Date  . Persistent atrial fibrillation ([place])        . Hypertension   . Hypoglycemia   . Sensory ataxia   . Sleep apnea     CPAP  . Cancer [facility]) 2010    bladder   Past Surgical History  Procedure Laterality Date  . Lumbar disc surgery  1991  . Cardioversion N/A 04/23/2015    Procedure: CARDIOVERSION;  Surgeon: [name], MD;  Location: [facility] ENDOSCOPY;  Service: Cardiovascular;  Laterality: N/A;  . Cardioversion N/A 07/30/2015    Procedure: CARDIOVERSION;  Surgeon: [name], MD;  Location: [facility] ENDOSCOPY;  Service: Cardiovascular;  Laterality: N/A;     Current Outpatient Prescriptions  Medication Sig Dispense Refill  . Calcium-Magnesium 500-250 MG TABS Take 1 tablet by mouth daily.      . Cholecalciferol 1000 UNIT capsule Take 2,000 Units by mouth 2 (two) times daily.     . Coenzyme Q10 (EQL COQ10) 100 MG capsule Take 200 mg by mouth daily.     Marland Kitchen COUMADIN 5 MG tablet Take 1 tablet by mouth daily or as directed by coumadin clinic (Patient taking differently: Take 5 mg by mouth daily at 6 PM. Take 5 mg Monday, Wednesday, and Friday and 2.5 all other days.) 30 tablet 3  . Cyanocobalamin (VITAMIN B 12 PO) Take 500 mcg by mouth 2 (two) times daily.     Marland Kitchen dofetilide (TIKOSYN) 250 MCG capsule Take 1 capsule (250 mcg total) by mouth 2 (two) times daily. 180 capsule 3  . escitalopram (LEXAPRO) 10 MG tablet Take 10 mg by mouth at bedtime.     . furosemide (LASIX) 20 MG tablet Take 1 tablet (20 mg total) by mouth daily. 30 tablet 6  . irbesartan (AVAPRO) 300 MG tablet Take 1 tablet (300 mg total) by mouth daily. 90 tablet 3  . Multiple Vitamins-Minerals (CENTRUM SILVER PO) Take 1 tablet by mouth daily.      . OXYGEN Inhale 2 L into the lungs at bedtime. Uses with CPAP machine     No current facility-administered medications for this  encounter.    Allergies  Allergen Reactions  . Carbapenems   . Cephalosporins Other (See Comments)  . Ciprofloxacin     Pt can not take with amiodarone   . Decongestant [Oxymetazoline]     All decongestant - makes prostate swell  . Diltiazem Swelling    Left leg swelling  . Flagyl [Metronidazole] Nausea Only    Stomach ache  . Hctz [Hydrochlorothiazide]   . Levaquin [Levofloxacin In D5w] Other (See Comments)    unknown  . Lidocaine     Pt unsure of  . Penicillins Swelling  . Teline [Tetracycline] Swelling    Hospitalization for 2 weeks, prostate swelling  . Tetanus Toxoid Swelling    Swells arms, Please pre-medicate with benadryl.  Marland Kitchen Z-Pak [Azithromycin] Other (See Comments)    Pt can not take with  Amiodarone   . Sulfonamide Derivatives Rash  . Toprol Xl [Metoprolol Tartrate] Itching and Rash    Social History   Social History  . Marital Status: Married    Spouse Name: [name]  . Number of Children: 2  . Years of Education: 16   Occupational History  .     Social History Main Topics  . Smoking status: Former Smoker    Types: Cigarettes    Quit date: 12/13/1970  . Smokeless tobacco: Never Used  . Alcohol Use: No  . Drug Use: No  . Sexual Activity: Not on file   Other Topics Concern  . 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   Patient has two children.   Patient is retired.   Patient has a college education.   Patient is right-handed.   Patient does not drink any caffeine.    Family History  Problem Relation Age of Onset  . Heart failure Father   . Hypertension Father   . Dementia Mother     ROS- All systems are reviewed and negative except as per the HPI above  Physical Exam: Filed Vitals:   08/28/15 0931  BP: 126/58  Pulse: 61  Height: 5\' 9"  (1.753 m)  Weight: 261 lb (118.389 kg)    GEN- The patient is well appearing, alert and oriented x 3 today.   Head- normocephalic, atraumatic Eyes-  Sclera clear, conjunctiva pink Ears- hearing intact Oropharynx- clear Neck- supple, no JVP Lymph- no cervical lymphadenopathy Lungs- Clear to ausculation bilaterally, normal work of breathing Heart- Regular rate and rhythm, no murmurs, rubs or gallops, PMI not laterally displaced GI- soft, NT, ND, + BS Extremities- no clubbing, cyanosis, or edema MS- no significant deformity or atrophy Skin- no rash or lesion Psych- euthymic mood, full affect Neuro- strength and sensation are intact  EKG- SR with sinus arrhythmia, PR int 186 ms, QRS 92 ms, QTc 422 ms Epic records refiewed  Assessment and Plan: 1. Persistent afib  Recent loading of tikosyn and maintaining SR Continue tikosyn 250 mg bid Continue  coumadin  Bmet/mag today  2. Rash Continue off metoprolol for improvement  Sees dermatologist again in December   3. Peripheral neuropathy Some increased symptoms involving LLE, do not think related to [place] or recent DCCV   I did check the literature and Tikosyn use associated with neuropathy reported at 0.7158% in clinical trials  F/u with Dr, Percival Spanish in December and afib clinic as needed F/u with Afib clinic  As needed  [place]. Katilyn Miltenberger, [facility] [address]  [place], [place] [address] [phone]

## 2015-08-31 ENCOUNTER — Ambulatory Visit (INDEPENDENT_AMBULATORY_CARE_PROVIDER_SITE_OTHER): Payer: Medicare Other | Admitting: *Deleted

## 2015-08-31 DIAGNOSIS — I4891 Unspecified atrial fibrillation: Secondary | ICD-10-CM | POA: Diagnosis not present

## 2015-08-31 DIAGNOSIS — Z5181 Encounter for therapeutic drug level monitoring: Secondary | ICD-10-CM

## 2015-08-31 DIAGNOSIS — I481 Persistent atrial fibrillation: Secondary | ICD-10-CM

## 2015-08-31 DIAGNOSIS — I4819 Other persistent atrial fibrillation: Secondary | ICD-10-CM

## 2015-08-31 DIAGNOSIS — Z7901 Long term (current) use of anticoagulants: Secondary | ICD-10-CM

## 2015-08-31 LAB — POCT INR: INR: 2.4

## 2015-09-05 ENCOUNTER — Other Ambulatory Visit: Payer: Self-pay | Admitting: Cardiology

## 2015-09-11 ENCOUNTER — Telehealth: Payer: Self-pay | Admitting: Cardiology

## 2015-09-11 NOTE — Telephone Encounter (Signed)
Returned call to patient.He stated he wanted to change coumadin to warfarin.Stated brand name coumadin cost too much.Message sent to Edrick Oh RN coumadin clinic in [place] office.

## 2015-09-11 NOTE — Telephone Encounter (Signed)
Patient would like to talk to someone in reference to switching from coumadin to warfin

## 2015-09-14 MED ORDER — WARFARIN SODIUM 5 MG PO TABS: 5.0000 mg | ORAL_TABLET | Freq: Every day | ORAL | Status: DC

## 2015-09-14 NOTE — Telephone Encounter (Signed)
Rx for Warfarin 5mg  tablets sent to [facility]

## 2015-09-23 ENCOUNTER — Ambulatory Visit (INDEPENDENT_AMBULATORY_CARE_PROVIDER_SITE_OTHER): Payer: Medicare Other | Admitting: *Deleted

## 2015-09-23 DIAGNOSIS — I4891 Unspecified atrial fibrillation: Secondary | ICD-10-CM | POA: Diagnosis not present

## 2015-09-23 DIAGNOSIS — I481 Persistent atrial fibrillation: Secondary | ICD-10-CM

## 2015-09-23 DIAGNOSIS — Z5181 Encounter for therapeutic drug level monitoring: Secondary | ICD-10-CM | POA: Diagnosis not present

## 2015-09-23 DIAGNOSIS — I4819 Other persistent atrial fibrillation: Secondary | ICD-10-CM

## 2015-09-23 DIAGNOSIS — Z7901 Long term (current) use of anticoagulants: Secondary | ICD-10-CM

## 2015-09-23 LAB — POCT INR: INR: 2.5

## 2015-10-30 ENCOUNTER — Ambulatory Visit (INDEPENDENT_AMBULATORY_CARE_PROVIDER_SITE_OTHER): Payer: Medicare Other | Admitting: Cardiology

## 2015-10-30 ENCOUNTER — Encounter: Payer: Self-pay | Admitting: Cardiology

## 2015-10-30 VITALS — BP 130/66 | HR 71 | Ht 69.0 in | Wt 262.1 lb

## 2015-10-30 DIAGNOSIS — I480 Paroxysmal atrial fibrillation: Secondary | ICD-10-CM

## 2015-10-30 DIAGNOSIS — Z79899 Other long term (current) drug therapy: Secondary | ICD-10-CM | POA: Diagnosis not present

## 2015-10-30 LAB — CBC
HCT: 46.8 % (ref 39.0–52.0)
Hemoglobin: 15.7 g/dL (ref 13.0–17.0)
MCH: 29.3 pg (ref 26.0–34.0)
MCHC: 33.5 g/dL (ref 30.0–36.0)
MCV: 87.5 fL (ref 78.0–100.0)
MPV: 10.2 fL (ref 8.6–12.4)
PLATELETS: 212 10*3/uL (ref 150–400)
RBC: 5.35 MIL/uL (ref 4.22–5.81)
RDW: 14.5 % (ref 11.5–15.5)
WBC: 7 10*3/uL (ref 4.0–10.5)

## 2015-10-30 LAB — BASIC METABOLIC PANEL
BUN: 12 mg/dL (ref 7–25)
CO2: 29 mmol/L (ref 20–31)
Calcium: 8.9 mg/dL (ref 8.6–10.3)
Chloride: 102 mmol/L (ref 98–110)
Creat: 1.06 mg/dL (ref 0.70–1.11)
Glucose, Bld: 110 mg/dL — ABNORMAL HIGH (ref 65–99)
POTASSIUM: 4.3 mmol/L (ref 3.5–5.3)
Sodium: 140 mmol/L (ref 135–146)

## 2015-10-30 NOTE — Patient Instructions (Signed)
Your physician recommends that you schedule a follow-up appointment in: 1 Month with Roderic Palau in [place] clinic  Your physician recommends that you return for lab work in: [facility] and Magnesium  Merry Christmas and [place]!!

## 2015-10-30 NOTE — Progress Notes (Signed)
HPI The Chad [name] presents for followup of atrial fibrillation. He has been taken off of his amiodarone because of neuropathy.  He has failed flecainide.  He is now on Tikosyn.  He reports that he still has a rash with an unclear etiology. In additio he still bothered by neuropathy which she thinks is worse since his cardioversion.   However, he says that he feels much better since he's been cardioverted. He's not feeling his heart rate increases much. He's able to be active. He does little exercise. He raked leaves.  He otherwise feels OK.    Allergies  Allergen Reactions  . Carbapenems   . Cephalosporins Other (See Comments)  . Ciprofloxacin     Pt can not take with amiodarone   . Decongestant [Oxymetazoline]     All decongestant - makes prostate swell  . Diltiazem Swelling    Left leg swelling  . Flagyl [Metronidazole] Nausea Only    Stomach ache  . Hctz [Hydrochlorothiazide]   . Levaquin [Levofloxacin In D5w] Other (See Comments)    unknown  . Lidocaine     Pt unsure of  . Penicillins Swelling  . Teline [Tetracycline] Swelling    Hospitalization for 2 weeks, prostate swelling  . Tetanus Toxoid Swelling    Swells arms, Please pre-medicate with benadryl.  Marland Kitchen Z-Pak [Azithromycin] Other (See Comments)    Pt can not take with Amiodarone   . Sulfonamide Derivatives Rash  . Toprol Xl [Metoprolol Tartrate] Itching and Rash    Current Outpatient Prescriptions  Medication Sig Dispense Refill  . Calcium-Magnesium 500-250 MG TABS Take 1 tablet by mouth daily.      . Cholecalciferol 1000 UNIT capsule Take 2,000 Units by mouth 2 (two) times daily.     . Coenzyme Q10 (EQL COQ10) 100 MG capsule Take 200 mg by mouth daily.     . Cyanocobalamin (VITAMIN B 12 PO) Take 500 mcg by mouth 2 (two) times daily.     Marland Kitchen dofetilide (TIKOSYN) 250 MCG capsule Take 1 capsule (250 mcg total) by mouth 2 (two) times daily. 180 capsule 3  . escitalopram (LEXAPRO) 10 MG tablet Take 10 mg by mouth at bedtime.      . furosemide (LASIX) 20 MG tablet Take 1 tablet (20 mg total) by mouth daily. 30 tablet 6  . irbesartan (AVAPRO) 300 MG tablet Take 1 tablet (300 mg total) by mouth daily. 90 tablet 3  . Multiple Vitamins-Minerals (CENTRUM SILVER PO) Take 1 tablet by mouth daily.      . OXYGEN Inhale 2 L into the lungs at bedtime. Uses with CPAP machine    . warfarin (COUMADIN) 5 MG tablet Take 1 tablet (5 mg total) by mouth daily. (Chad [name] taking differently: Take 5 mg by mouth daily. Pt take 5 mg on Monday Wednesday and Friday  Take 2.5 mg on Tuesday Thursday and Sunday) 30 tablet 6   No current facility-administered medications for this visit.    Past Medical History  Diagnosis Date  . Persistent atrial fibrillation ([place])        . Hypertension   . Hypoglycemia   . Sensory ataxia   . Sleep apnea     CPAP  . Cancer [facility]) 2010    bladder    Past Surgical History  Procedure Laterality Date  . Lumbar disc surgery  1991  . Cardioversion N/A 04/23/2015    Procedure: CARDIOVERSION;  Surgeon: [name], MD;  Location: [facility];  Service: Cardiovascular;  Laterality:  N/A;  . Cardioversion N/A 07/30/2015    Procedure: CARDIOVERSION;  Surgeon: [name], MD;  Location: [facility] ENDOSCOPY;  Service: Cardiovascular;  Laterality: N/A;    ROS:   As stated in the HPI and negative for all other systems.  PHYSICAL EXAM BP 130/66 mmHg  Pulse 71  Ht 5\' 9"  (1.753 m)  Wt 262 lb 1.6 oz (118.888 kg)  BMI 38.69 kg/m2 GENERAL:  Well appearing HEENT:  PERRL NECK:  No jugular venous distention, waveform within normal limits, carotid upstroke brisk and symmetric, no bruits, no thyromegaly LUNGS:  Clear to auscultation bilaterally HEART:  PMI not displaced or sustained,S1 and S2 within normal limits, no S3, no S4, no clicks, no rubs, no murmurs ABD:  Flat, positive bowel sounds normal in frequency in pitch, no bruits, no rebound, no guarding, no midline pulsatile mass, no hepatomegaly, no splenomegaly EXT:   2 plus pulses throughout, left greater than right lower extremity edema edema, no cyanosis no clubbing NEURO:  Nonfocal SKIN:  Dry rash  EKG:    Normal sinus rhythm, rate 71, axis within normal limits, QTC slightly prolonged, premature atrial contractions, lateral T-wave inversions unchanged from previous. 10/30/2015   ASSESSMENT AND PLAN  ATRIAL FIBRILLATION/FLUTTER:    Chad [name] has a CHA2DS2 - VASc score of 3 with a risk of stroke of 3.2%.  He is doing well on warfarin.   His  QTC is slightly increased. I will check his basic metabolic profile and magnesium today. I like him to go back to the true fibrillation clinic for repeat EKG next month as we may need to consider reducing his dose.  HTN:   The blood pressure is at target. No change in medications is indicated. We will continue with therapeutic lifestyle changes (TLC).  OVERWEIGHT:  The Chad [name] understands the need to lose weight with diet and exercise.  We talked about this again today.

## 2015-11-04 ENCOUNTER — Ambulatory Visit (INDEPENDENT_AMBULATORY_CARE_PROVIDER_SITE_OTHER): Payer: Medicare Other | Admitting: Pharmacist

## 2015-11-04 DIAGNOSIS — I481 Persistent atrial fibrillation: Secondary | ICD-10-CM | POA: Diagnosis not present

## 2015-11-04 DIAGNOSIS — I4891 Unspecified atrial fibrillation: Secondary | ICD-10-CM | POA: Diagnosis not present

## 2015-11-04 DIAGNOSIS — Z7901 Long term (current) use of anticoagulants: Secondary | ICD-10-CM

## 2015-11-04 DIAGNOSIS — Z5181 Encounter for therapeutic drug level monitoring: Secondary | ICD-10-CM | POA: Diagnosis not present

## 2015-11-04 DIAGNOSIS — I4819 Other persistent atrial fibrillation: Secondary | ICD-10-CM

## 2015-11-04 LAB — POCT INR: INR: 2

## 2015-11-30 ENCOUNTER — Encounter (HOSPITAL_COMMUNITY): Payer: Self-pay | Admitting: Nurse Practitioner

## 2015-11-30 ENCOUNTER — Ambulatory Visit (HOSPITAL_COMMUNITY)
Admission: RE | Admit: 2015-11-30 | Discharge: 2015-11-30 | Disposition: A | Discharge status: Home / Self Care | Payer: Medicare Other | Source: Ambulatory Visit | Attending: Nurse Practitioner | Admitting: Nurse Practitioner

## 2015-11-30 VITALS — BP 142/64 | HR 83 | Ht 70.0 in | Wt 264.0 lb

## 2015-11-30 DIAGNOSIS — Z887 Allergy status to serum and vaccine status: Secondary | ICD-10-CM | POA: Diagnosis not present

## 2015-11-30 DIAGNOSIS — I4891 Unspecified atrial fibrillation: Secondary | ICD-10-CM | POA: Diagnosis present

## 2015-11-30 DIAGNOSIS — I481 Persistent atrial fibrillation: Secondary | ICD-10-CM | POA: Diagnosis not present

## 2015-11-30 DIAGNOSIS — Z880 Allergy status to penicillin: Secondary | ICD-10-CM | POA: Diagnosis not present

## 2015-11-30 DIAGNOSIS — Z8551 Personal history of malignant neoplasm of bladder: Secondary | ICD-10-CM | POA: Insufficient documentation

## 2015-11-30 DIAGNOSIS — I10 Essential (primary) hypertension: Secondary | ICD-10-CM | POA: Diagnosis not present

## 2015-11-30 DIAGNOSIS — I4819 Other persistent atrial fibrillation: Secondary | ICD-10-CM

## 2015-11-30 DIAGNOSIS — Z79899 Other long term (current) drug therapy: Secondary | ICD-10-CM | POA: Insufficient documentation

## 2015-11-30 DIAGNOSIS — Z882 Allergy status to sulfonamides status: Secondary | ICD-10-CM | POA: Insufficient documentation

## 2015-11-30 DIAGNOSIS — Z87891 Personal history of nicotine dependence: Secondary | ICD-10-CM | POA: Insufficient documentation

## 2015-11-30 DIAGNOSIS — Z888 Allergy status to other drugs, medicaments and biological substances status: Secondary | ICD-10-CM | POA: Insufficient documentation

## 2015-11-30 DIAGNOSIS — Z881 Allergy status to other antibiotic agents status: Secondary | ICD-10-CM | POA: Insufficient documentation

## 2015-11-30 DIAGNOSIS — Z7901 Long term (current) use of anticoagulants: Secondary | ICD-10-CM | POA: Diagnosis not present

## 2015-11-30 DIAGNOSIS — Z8249 Family history of ischemic heart disease and other diseases of the circulatory system: Secondary | ICD-10-CM | POA: Diagnosis not present

## 2015-11-30 DIAGNOSIS — G4730 Sleep apnea, unspecified: Secondary | ICD-10-CM | POA: Insufficient documentation

## 2015-11-30 LAB — BASIC METABOLIC PANEL
Anion gap: 8 (ref 5–15)
BUN: 11 mg/dL (ref 6–20)
CHLORIDE: 103 mmol/L (ref 101–111)
CO2: 28 mmol/L (ref 22–32)
CREATININE: 1.12 mg/dL (ref 0.61–1.24)
Calcium: 9.5 mg/dL (ref 8.9–10.3)
GFR calc Af Amer: >60 mL/min (ref >60)
GFR calc non Af Amer: 58 mL/min — ABNORMAL LOW (ref >60)
GLUCOSE: 157 mg/dL — AB (ref 65–99)
POTASSIUM: 4.4 mmol/L (ref 3.5–5.1)
SODIUM: 139 mmol/L (ref 135–145)

## 2015-11-30 LAB — PROTIME-INR
INR: 2.62 — ABNORMAL HIGH (ref 0.00–1.49)
Prothrombin Time: 27.7 seconds — ABNORMAL HIGH (ref 11.6–15.2)

## 2015-11-30 LAB — MAGNESIUM: Magnesium: 2 mg/dL (ref 1.7–2.4)

## 2015-11-30 NOTE — Progress Notes (Addendum)
Patient ID: Chad [name], male   DOB: [birth year], 82 y.o.   MRN: [id number]     Primary Care Physician: Chad Kilts, MD Referring Physician: [name] f/u   Chad [name] is a 82 y.o. male with a h/o persistent afib in past failing flecainide and amiodarone , was stopped due to neuropathy. He was recently started on tikosyn and is in the afib clinc for f/u. He is in SR but with prolonged qtc. No afib that pt is aware of. He has been feeling well. He denies any new drugs or change in previous drugs. No change in general health.  Today, he denies symptoms of palpitations, chest [name], shortness of breath, orthopnea, PND, lower extremity edema, dizziness, presyncope, syncope, or neurologic sequela. The patient is tolerating medications without difficulties and is otherwise without complaint today.   Past Medical History  Diagnosis Date  . Persistent atrial fibrillation (Chad [name])        . Hypertension   . Hypoglycemia   . Sensory ataxia   . Sleep apnea     CPAP  . Cancer [facility]) 2010    bladder   Past Surgical History  Procedure Laterality Date  . Lumbar disc surgery  1991  . Cardioversion N/A 04/23/2015    Procedure: CARDIOVERSION;  Surgeon: Chad Records, MD;  Location: [facility] [name] ENDOSCOPY;  Service: Cardiovascular;  Laterality: N/A;  . Cardioversion N/A 07/30/2015    Procedure: CARDIOVERSION;  Surgeon: Chad Pain, MD;  Location: [name] ENDOSCOPY;  Service: Cardiovascular;  Laterality: N/A;    Current Outpatient Prescriptions  Medication Sig Dispense Refill  . Calcium-Magnesium 500-250 MG TABS Take 1 tablet by mouth daily.      . Cholecalciferol 1000 UNIT capsule Take 2,000 Units by mouth 2 (two) times daily.     . Coenzyme Q10 (EQL COQ10) 100 MG capsule Take 200 mg by mouth daily.     . Cyanocobalamin (VITAMIN B 12 PO) Take 500 mcg by mouth 2 (two) times daily.     Marland Kitchen dofetilide (TIKOSYN) 250 MCG capsule Take 1 capsule (250 mcg total) by mouth 2 (two) times daily. 180 capsule 3  .  escitalopram (LEXAPRO) 10 MG tablet Take 10 mg by mouth at bedtime.     . furosemide (LASIX) 20 MG tablet Take 1 tablet (20 mg total) by mouth daily. 30 tablet 6  . irbesartan (AVAPRO) 300 MG tablet Take 1 tablet (300 mg total) by mouth daily. 90 tablet 3  . Multiple Vitamins-Minerals (CENTRUM SILVER PO) Take 1 tablet by mouth daily.      . OXYGEN Inhale 2 L into the lungs at bedtime. Uses with CPAP machine    . warfarin (COUMADIN) 5 MG tablet Take 1 tablet (5 mg total) by mouth daily. (Patient taking differently: Take 5 mg by mouth daily. Pt take 5 mg on Monday Wednesday and Friday  Take 2.5 mg on Tuesday Thursday and Sunday) 30 tablet 6   No current facility-administered medications for this encounter.    Allergies  Allergen Reactions  . Carbapenems   . Cephalosporins Other (See Comments)  . Ciprofloxacin     Pt can not take with amiodarone   . Decongestant [Oxymetazoline]     All decongestant - makes prostate swell  . Diltiazem Swelling    Left leg swelling  . Flagyl [Metronidazole] Nausea Only    Stomach ache  . Hctz [Hydrochlorothiazide]   . Levaquin [Levofloxacin In D5w] Other (See Comments)    unknown  . Lidocaine  Pt unsure of  . Penicillins Swelling  . Teline [Tetracycline] Swelling    Hospitalization for 2 weeks, prostate swelling  . Tetanus Toxoid Swelling    Swells arms, Please pre-medicate with benadryl.  Marland Kitchen Z-Pak [Azithromycin] Other (See Comments)    Pt can not take with Amiodarone   . Sulfonamide Derivatives Rash  . Toprol Xl [Metoprolol Tartrate] Itching and Rash    Social History   Social History  . Marital Status: Married    Spouse Name: Chad [name]  . Number of Children: 2  . Years of Education: 16   Occupational History  .     Social History Main Topics  . Smoking status: Former Smoker    Types: Cigarettes    Quit date: 12/13/1970  . Smokeless tobacco: Never Used  . Alcohol Use: No  . Drug Use: No  . Sexual Activity: Not on file   Other  Topics Concern  . 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   Patient has two children.   Patient is retired.   Patient has a college education.   Patient is right-handed.   Patient does not drink any caffeine.    Family History  Problem Relation Age of Onset  . Heart failure Father   . Hypertension Father   . Dementia Mother     ROS- All systems are reviewed and negative except as per the HPI above  Physical Exam: Filed Vitals:   11/30/15 1114  BP: 142/64  Pulse: 83  Height: 5\' 10"  (1.778 m)  Weight: 264 lb (119.75 kg)    GEN- The patient is well appearing, alert and oriented x 3 today.   Head- normocephalic, atraumatic Eyes-  Sclera clear, conjunctiva pink Ears- hearing intact Oropharynx- clear Neck- supple, no JVP Lymph- no cervical lymphadenopathy Lungs- Clear to ausculation bilaterally, normal work of breathing Heart- Regular rate and rhythm, no murmurs, rubs or gallops, PMI not laterally displaced GI- soft, NT, ND, + BS Extremities- no clubbing, cyanosis, or edema MS- no significant deformity or atrophy Skin- no rash or lesion Psych- euthymic mood, full affect Neuro- strength and sensation are intact  EKG- SR with pac's, PR int 192 ms, QRS 90 ms, QTc 512 ms Epic [name] reviewed  Assessment and Plan: 1. Persistent afib  In SR on tikosyn but with long qtc Will review with Chad [name] to see if dose reduction would be appropriate Continue coumadin  Bmet/mag, INR today  2. Rash Resolved with d/c of metoprolol  F/u as needed when Chad [name] reviews EKG  Chad [name]. Chad [name] Chad [name] [address] [name],  [address] [phone]  1/17- EKG reviewed by Chad [name]. He believes the QTc to be shorter than what is being read out by computer and does not believe any dose adjustment to be needed. Repeat EKG in 2 weeks.

## 2015-12-01 ENCOUNTER — Other Ambulatory Visit: Payer: Self-pay | Admitting: Cardiology

## 2015-12-01 ENCOUNTER — Telehealth: Payer: Self-pay | Admitting: *Deleted

## 2015-12-01 MED ORDER — IRBESARTAN 300 MG PO TABS: 300.0000 mg | ORAL_TABLET | Freq: Every day | ORAL | Status: DC

## 2015-12-01 NOTE — Telephone Encounter (Signed)
Pt had his INR checked at the A-fib clinic

## 2015-12-01 NOTE — Addendum Note (Signed)
Encounter addended by: Sherran Needs, NP on: 12/01/2015  9:53 AM<BR>     Documentation filed: Notes Section

## 2015-12-07 ENCOUNTER — Ambulatory Visit (INDEPENDENT_AMBULATORY_CARE_PROVIDER_SITE_OTHER): Payer: Medicare Other | Admitting: *Deleted

## 2015-12-07 DIAGNOSIS — I4819 Other persistent atrial fibrillation: Secondary | ICD-10-CM

## 2015-12-07 DIAGNOSIS — I481 Persistent atrial fibrillation: Secondary | ICD-10-CM

## 2015-12-07 DIAGNOSIS — Z5181 Encounter for therapeutic drug level monitoring: Secondary | ICD-10-CM

## 2015-12-07 NOTE — Telephone Encounter (Signed)
See coumadin note. 

## 2015-12-21 ENCOUNTER — Ambulatory Visit (INDEPENDENT_AMBULATORY_CARE_PROVIDER_SITE_OTHER): Payer: Medicare Other | Admitting: *Deleted

## 2015-12-21 DIAGNOSIS — Z5181 Encounter for therapeutic drug level monitoring: Secondary | ICD-10-CM

## 2015-12-21 DIAGNOSIS — I481 Persistent atrial fibrillation: Secondary | ICD-10-CM | POA: Diagnosis not present

## 2015-12-21 DIAGNOSIS — I4819 Other persistent atrial fibrillation: Secondary | ICD-10-CM

## 2015-12-21 DIAGNOSIS — I4891 Unspecified atrial fibrillation: Secondary | ICD-10-CM | POA: Diagnosis not present

## 2015-12-21 DIAGNOSIS — Z7901 Long term (current) use of anticoagulants: Secondary | ICD-10-CM

## 2015-12-21 LAB — POCT INR: INR: 2.3

## 2015-12-24 ENCOUNTER — Ambulatory Visit (HOSPITAL_COMMUNITY)
Admission: RE | Admit: 2015-12-24 | Discharge: 2015-12-24 | Disposition: A | Discharge status: Home / Self Care | Payer: Medicare Other | Source: Ambulatory Visit | Attending: Nurse Practitioner | Admitting: Nurse Practitioner

## 2015-12-24 DIAGNOSIS — I480 Paroxysmal atrial fibrillation: Secondary | ICD-10-CM

## 2015-12-24 DIAGNOSIS — I4891 Unspecified atrial fibrillation: Secondary | ICD-10-CM | POA: Insufficient documentation

## 2015-12-24 DIAGNOSIS — R9431 Abnormal electrocardiogram [ECG] [EKG]: Secondary | ICD-10-CM | POA: Insufficient documentation

## 2015-12-24 NOTE — Progress Notes (Addendum)
Pt in for repeat ekg. Chad Palau NP to review ekg Ekg repeated due to prolonged QTc from previous EKG. Today, shows SR with PC's, qrs int 92 ms, qtc improved at 478 ms.f/u 3 months

## 2016-01-04 ENCOUNTER — Telehealth: Payer: Self-pay

## 2016-01-04 DIAGNOSIS — C672 Malignant neoplasm of lateral wall of bladder: Secondary | ICD-10-CM | POA: Insufficient documentation

## 2016-01-04 DIAGNOSIS — I4891 Unspecified atrial fibrillation: Secondary | ICD-10-CM | POA: Diagnosis not present

## 2016-01-04 DIAGNOSIS — D074 Carcinoma in situ of penis: Secondary | ICD-10-CM | POA: Insufficient documentation

## 2016-01-04 DIAGNOSIS — G4733 Obstructive sleep apnea (adult) (pediatric): Secondary | ICD-10-CM | POA: Diagnosis not present

## 2016-01-04 DIAGNOSIS — C679 Malignant neoplasm of bladder, unspecified: Secondary | ICD-10-CM | POA: Insufficient documentation

## 2016-01-04 NOTE — Telephone Encounter (Signed)
Pt came by sleep lab today requesting cpap supplies. Pt has not been since since may of 2015, and therefore, before we can generate an order for cpap supplies, pt would need to have an office visit with Dr. Brett Fairy AND BRING HIS CPAP MACHINE TO THE VISIT.  I called pt to schedule him for an appt and advise him of this information, no answer, left a message asking him to call us back. If pt calls back, please advise him of the above and make him an appt with Dr. Brett Fairy.  Message from our sleep lab manager regarding pt's cpap supplies: " Patient came by to see about supplies. 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 He lives in [place].. Can we send sleep studies and new order for supplies to [facility] and tell them to use [facility] in [place]. It is 5 mins from house. He had sleep study in 2015

## 2016-01-05 NOTE — Telephone Encounter (Signed)
Patient returned Kristen's call. Appointment scheduled for Thursday 01/07/16, will bring CPAP machine.

## 2016-01-07 ENCOUNTER — Ambulatory Visit (INDEPENDENT_AMBULATORY_CARE_PROVIDER_SITE_OTHER): Payer: Medicare Other | Admitting: Neurology

## 2016-01-07 ENCOUNTER — Encounter: Payer: Self-pay | Admitting: Neurology

## 2016-01-07 ENCOUNTER — Other Ambulatory Visit: Payer: Self-pay

## 2016-01-07 VITALS — BP 150/83 | HR 68 | Resp 16 | Ht 70.0 in | Wt 271.0 lb

## 2016-01-07 DIAGNOSIS — I491 Atrial premature depolarization: Secondary | ICD-10-CM | POA: Diagnosis not present

## 2016-01-07 DIAGNOSIS — G4736 Sleep related hypoventilation in conditions classified elsewhere: Secondary | ICD-10-CM

## 2016-01-07 DIAGNOSIS — G4733 Obstructive sleep apnea (adult) (pediatric): Secondary | ICD-10-CM

## 2016-01-07 DIAGNOSIS — Z9989 Dependence on other enabling machines and devices: Principal | ICD-10-CM

## 2016-01-07 NOTE — Progress Notes (Signed)
PATIENT: Chad [name] DOB: 1929-08-18  REASON FOR VISIT: follow up HISTORY FROM: patient  HISTORY OF PRESENT ILLNESS: Chad [name] is a 82 y.o. male Is seen here as a revisit from Dr. Percival Spanish and Hilma Favors , originally for Balance problems, now followed for OSA and CPAP compliance.  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 He also had a history of vitamin B12 deficiency which has been supplemented meanwhile. The patient underwent a sleep study on 10-22-2013 with a BMI of 38.5 and a neck circumference of 18 inches concerned of daytime excessive fatigue. The patient was diagnosed with an AHI of 63.8 and an RDI of 75.  His oxygen at nadir was 80% on 19.4 minutes. The patient was titrated to 7 cm water in the AHI was now 0.0 concerning was that he had for about 86 periodic limb movements and his arousal index was 6.2 based on the and in arousals. The oxygen nadir 86% but he still had prolonged desaturations. He was prescribed CPAP at 10 cm water with a Airfit P10 mask.  His cardiologist and primary care physician hava meanwhile weaned him amiodarone since February - but he states that he feels more fatigued than before- yet on CPAP he sleeps better.  His atrial fib remains controlled, but he was warned by Dr. Gerarda Fraction, that he may be back to the hospital if rate control fails.  His download from [facility] documented a download from his AIRSENSE PAP machine. the residual AHI of 2.4 the patient had an 88% compliance rate. He uses CPAP 5 hours and 41 minutes at night. CPAP is at a pressure of 10 cm water the download is dated 01-15-14 by Richardean Canal. Mask fitting issues were addressed with Chad [name]. We discuss today that we will check on overnight oxygen needs while on CPAP, and that he may nee to return to Amiodarone in the near future. His neuropathy will not improve much off Amiodarone.  Chad [name] reports that he has  developed gait instability and balance problems. He used the term ' I can't walk a straight line anymore" .  Reports having fallen 3 times in the last 6 months, he needs to with his gaze to his feet to walk steady. In addition he reports that remotely perhaps 20 years ago he suffered for 4 continuous years from vertical and felt constantly in motion but actually not moving. This episode however concluded that the initiation of niacin.  He had worked with radioisotopes, and The exposure was considered another possible cause of his Vertigo.  He does not consider his current episodes alike, and neither does he have syncope or presyncope.  He is ataxic and neuropathic. He takes B 12 supplement.  The patient suffers for long time from intrafibrillation, and ablation was never attempted he reports and according to Dr. Rosezella Florida note ,he has been able to maintain sinus rhythm.  Amiodarone causes neuropathies and he has been on this medication now for several years. Actually, decades.  Thyroid hormones and liver enzymes are followed by his primary care physician, Dr. Hilma Favors.   Interval History 12-28-15; Status post cardioversion, neuropathy- feeling sticking and glove distribution. Chad [name] is now in NSR.  His CPAP download in office revealed a good compliance. 100% of days and 97% of hours, average is 9 hours of use, AHi was not obtained. He feels refreshed after using it.  FSS 33, Epworth 2, on 2 liters bled  ito CPAP. He may not be hypoxymic since he has been in NSR but he sure felt better after having it.     Last visit  03/14/14 ([place]): patient returns for 4 week revisit to go over ONO and CPCP review, he did not bring cpap or chip.   This patient overnight pulse oximetry date at-10-15 documented 8 hours of recorded time and 23 minutes and 12 seconds at the level 89% oxygenation.  The nadir was 81% saturation, the study was performed overnight and while on CPAP.  He has been ordered 2L O2 at night and is  tolerating it well. Cpap download date range was 47 days, CPAP pressure of 10 cm of water, and average our to the usage per night is 5 hours 35 minutes. Download compliance percentage is 78%. AHI noted on sleep study was 63.8, AHI noted on download is 2.6. Apnea index 2.6. Hypopnea and index 0.  He still endorses significant daytime fatigue.  Review of Systems:  Out of a complete 14 system review, the patient complains of only the following symptoms, and all other reviewed systems are negative.  Soreness over quadriceps and truncal core strength reduced. He has ataxia, and neuropathy.  High fall risk while on coumadin! Off amiodarone now. Still fatigued.  FSS 39, down from 43 last visit, Epworth 4 points, same as last visit  ALLERGIES: PHYSICAL EXAM  Filed Vitals:   01/07/16 1101  BP: 150/83  Pulse: 68  Resp: 16  Height: 5\' 10"  (1.778 m)  Weight: 271 lb (122.925 kg)  SpO2: 96%   Body mass index is 38.88 kg/(m^2).  Generalized: Well developed, in no acute distress  Head: normocephalic and atraumatic. Oropharynx benign , Mallompati 1, Neck:  18.5  inches Supple, no carotid bruits  Cardiac: Regular rate rhythm, no murmur  Musculoskeletal: No deformity . Neurological examination  Mentation: Alert oriented to time, place, history taking. Follows all commands speech and language fluent Cranial nerve : his sense of taste and smell is intact.  Pupils are equal and briskly reactive to light.  Extraocular movements in vertical planes intact, and a strong horizontal nystagmus. Slightly more ptosis over the left eye. Visual fields by finger perimetry are intact.   Hearing to finger rub, bone and air conduction by tuning fork symmetric impaired . Facial sensation intact to fine touch. Facial motor strength is symmetric and tongue and uvula move midline.  Motor: The motor testing reveals 5 over 5 strength of all 4 extremities. Good symmetric motor tone is noted throughout.  Sensory: He has lost  vibration and filament touch completely over both feet, and above the ankle line. Mid knee high.   ASSESSMENT AND PLAN  82 y.o. year old male here for a yearly RV with OSA on CPAP compliance and hypoxemia monitoring.  25 minutes. Tinnitus is chronic , hearing loss is noted.   He has a past medical history of Arrhythmia; Hypertension; Hypoglycemia; Sensory ataxia (09/25/2013); Tinnitus of both ears (09/25/2013); Nystagmus, end-position (09/25/2013); and Sleep apnea here with here for followup of obstructive sleep apnea on CPAP.  After ONO study, 2 L oxygen was added to his CPAP at night and he has been tolerating well.    PLAN: Continue CPAP on current settings with 2 L oxygen Followup visit in 12 months, be sure to bring CPAP machine to every visit.   Philmore Pali, MSN, NP-C 01/07/2016, 11:34 AM [facility] [address] [place], [place] [address] [phone]

## 2016-01-25 ENCOUNTER — Telehealth: Payer: Self-pay

## 2016-01-25 DIAGNOSIS — Z9989 Dependence on other enabling machines and devices: Principal | ICD-10-CM

## 2016-01-25 DIAGNOSIS — G4733 Obstructive sleep apnea (adult) (pediatric): Secondary | ICD-10-CM

## 2016-01-25 NOTE — Telephone Encounter (Signed)
Pt came by office asking for a airfit P10 mask. He was advised that this needs to come from his DME and advised that an order would be sent to [facility].

## 2016-01-27 ENCOUNTER — Telehealth: Payer: Self-pay | Admitting: Cardiology

## 2016-01-27 NOTE — Telephone Encounter (Signed)
Please call,pt says he have been trying to pass out,this have happen 2 times today.

## 2016-01-27 NOTE — Telephone Encounter (Signed)
Pt states he has felt sensation last couple days where he is about to pass out.  Notes his HR feels "unusual".  Notes recent A Fib clinic visit - he was seen in January & started on Tikosyn. Had 2 week f/u EKG showing normal QT  Has been compliant w/ Tikosyn & other meds. Notes, however, since then, has had irregular beat for "weeks".  Today when checked, HR 78 and feels "steady". BP 145/84, 131/84 when checked this afternoon.   Called and spoke w/ Stacy at A Fib clinic -- added for 9:30 appt tomorrow.  Pt given appt info, phone # and gate code, and confirmed he will come for evaluation.  I asked that he allow spouse to drive him to appt, and he will try to avoid activities that increase sensation of syncope/near-syncope.

## 2016-01-28 ENCOUNTER — Ambulatory Visit (HOSPITAL_COMMUNITY)
Admission: RE | Admit: 2016-01-28 | Discharge: 2016-01-28 | Disposition: A | Discharge status: Home / Self Care | Payer: Medicare Other | Source: Ambulatory Visit | Attending: Nurse Practitioner | Admitting: Nurse Practitioner

## 2016-01-28 ENCOUNTER — Ambulatory Visit (INDEPENDENT_AMBULATORY_CARE_PROVIDER_SITE_OTHER): Payer: Medicare Other

## 2016-01-28 ENCOUNTER — Encounter (HOSPITAL_COMMUNITY): Payer: Self-pay | Admitting: Nurse Practitioner

## 2016-01-28 VITALS — BP 148/72 | HR 72 | Ht 70.0 in | Wt 266.4 lb

## 2016-01-28 DIAGNOSIS — Z7901 Long term (current) use of anticoagulants: Secondary | ICD-10-CM | POA: Insufficient documentation

## 2016-01-28 DIAGNOSIS — Z882 Allergy status to sulfonamides status: Secondary | ICD-10-CM | POA: Insufficient documentation

## 2016-01-28 DIAGNOSIS — G4730 Sleep apnea, unspecified: Secondary | ICD-10-CM | POA: Diagnosis not present

## 2016-01-28 DIAGNOSIS — Z881 Allergy status to other antibiotic agents status: Secondary | ICD-10-CM | POA: Diagnosis not present

## 2016-01-28 DIAGNOSIS — I481 Persistent atrial fibrillation: Secondary | ICD-10-CM

## 2016-01-28 DIAGNOSIS — I4891 Unspecified atrial fibrillation: Secondary | ICD-10-CM | POA: Insufficient documentation

## 2016-01-28 DIAGNOSIS — I4819 Other persistent atrial fibrillation: Secondary | ICD-10-CM

## 2016-01-28 DIAGNOSIS — Z888 Allergy status to other drugs, medicaments and biological substances status: Secondary | ICD-10-CM | POA: Diagnosis not present

## 2016-01-28 DIAGNOSIS — Z8551 Personal history of malignant neoplasm of bladder: Secondary | ICD-10-CM | POA: Diagnosis not present

## 2016-01-28 DIAGNOSIS — Z87891 Personal history of nicotine dependence: Secondary | ICD-10-CM | POA: Insufficient documentation

## 2016-01-28 DIAGNOSIS — I10 Essential (primary) hypertension: Secondary | ICD-10-CM | POA: Insufficient documentation

## 2016-01-28 DIAGNOSIS — Z79899 Other long term (current) drug therapy: Secondary | ICD-10-CM | POA: Insufficient documentation

## 2016-01-28 DIAGNOSIS — Z8249 Family history of ischemic heart disease and other diseases of the circulatory system: Secondary | ICD-10-CM | POA: Insufficient documentation

## 2016-01-28 DIAGNOSIS — Z880 Allergy status to penicillin: Secondary | ICD-10-CM | POA: Diagnosis not present

## 2016-01-28 LAB — BASIC METABOLIC PANEL
Anion gap: 8 (ref 5–15)
BUN: 12 mg/dL (ref 6–20)
CALCIUM: 9.3 mg/dL (ref 8.9–10.3)
CO2: 26 mmol/L (ref 22–32)
Chloride: 105 mmol/L (ref 101–111)
Creatinine, Ser: 1.2 mg/dL (ref 0.61–1.24)
GFR calc non Af Amer: 53 mL/min — ABNORMAL LOW (ref >60)
Glucose, Bld: 123 mg/dL — ABNORMAL HIGH (ref 65–99)
Potassium: 4.4 mmol/L (ref 3.5–5.1)
SODIUM: 139 mmol/L (ref 135–145)

## 2016-01-28 LAB — MAGNESIUM: MAGNESIUM: 2.1 mg/dL (ref 1.7–2.4)

## 2016-01-28 NOTE — Progress Notes (Signed)
Patient ID: Chad [name], male   DOB: 1929/04/13, 82 y.o.   MRN: [id number]     Primary Care Physician: Purvis Kilts, MD Referring Physician: [facility] f/u   Chad [name] is a 82 y.o. 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 He was  started on tikosyn 9/16 and was seen in the afib clinc for f/u in January. He was in SR, no complaints. He had prolonged qtc that day, reviewed with Dr. Caryl Comes and he felt QTC was ok. EKG repeated one week and qtc acceptable.Pt was not having any  afib that pt was aware of. He had been feeling well. He denies any new drugs or change in previous drugs. No change in general health.  He is being seen in the afib clinic today, 3/16 due to complaints of having vision dim for a few seconds at a time. He feels liks he could pass out. No syncope. Started a couple of weeks ago. He can be sitting or standing. He is not aware of any sensation of heart racing or skipping with episodes. He was aware of heart racing, for short period of time last night but did not have any episodes of vision dimming with it. He has been instructed not to drive until the origin of these episodes can be further determined.  Today, he denies symptoms of palpitations, chest pain, shortness of breath, orthopnea, PND, lower extremity edema, dizziness, presyncope, syncope, or neurologic sequela. The patient is tolerating medications without difficulties and is otherwise without complaint today.   Past Medical History  Diagnosis Date  . Persistent atrial fibrillation ([place])        . Hypertension   . Hypoglycemia   . Sensory ataxia   . Sleep apnea     CPAP  . Cancer [facility]) 2010    bladder   Past Surgical History  Procedure Laterality Date  . Lumbar disc surgery  1991  . Cardioversion N/A 04/23/2015    Procedure: CARDIOVERSION;  Surgeon: [name], MD;  Location: [facility] ENDOSCOPY;  Service: Cardiovascular;  Laterality: N/A;  .  Cardioversion N/A 07/30/2015    Procedure: CARDIOVERSION;  Surgeon: [name], MD;  Location: [facility] ENDOSCOPY;  Service: Cardiovascular;  Laterality: N/A;    Current Outpatient Prescriptions  Medication Sig Dispense Refill  . Calcium-Magnesium 500-250 MG TABS Take 1 tablet by mouth daily.      . Cholecalciferol 1000 UNIT capsule Take 2,000 Units by mouth 2 (two) times daily.     . Coenzyme Q10 (EQL COQ10) 100 MG capsule Take 200 mg by mouth daily.     . Cyanocobalamin (VITAMIN B 12 PO) Take 500 mcg by mouth 2 (two) times daily.     Marland Kitchen dofetilide (TIKOSYN) 250 MCG capsule Take 1 capsule (250 mcg total) by mouth 2 (two) times daily. 180 capsule 3  . escitalopram (LEXAPRO) 10 MG tablet Take 10 mg by mouth at bedtime.     . furosemide (LASIX) 20 MG tablet Take 1 tablet (20 mg total) by mouth daily. 30 tablet 6  . irbesartan (AVAPRO) 300 MG tablet Take 1 tablet (300 mg total) by mouth daily. 90 tablet 1  . Multiple Vitamins-Minerals (CENTRUM SILVER PO) Take 1 tablet by mouth daily.      . OXYGEN Inhale 2 L into the lungs at bedtime. Uses with CPAP machine    . warfarin (COUMADIN) 5 MG tablet Take 1 tablet (5 mg total)  by mouth daily. (Patient taking differently: Take 5 mg by mouth daily. Pt take 5 mg on Monday Wednesday and Friday  Take 2.5 mg on Tuesday Thursday and Sunday) 30 tablet 6   No current facility-administered medications for this encounter.    Allergies  Allergen Reactions  . Carbapenems   . Cephalosporins Other (See Comments)  . Chlorpheniramine Other (See Comments)    Swells prostate  . Ciprofloxacin     Pt can not take with amiodarone   . Decongestant [Oxymetazoline]     All decongestant - makes prostate swell  . Diltiazem Swelling    Left leg swelling  . Flagyl [Metronidazole] Nausea Only    Stomach ache  . Hctz [Hydrochlorothiazide]   . Levaquin [Levofloxacin In D5w] Other (See Comments)    unknown  . Levofloxacin Nausea Only  . Lidocaine     Pt unsure of  .  Penicillins Swelling  . Teline [Tetracycline] Swelling    Hospitalization for 2 weeks, prostate swelling  . Tetanus Toxoid Swelling    Swells arms, Please pre-medicate with benadryl.  Marland Kitchen Z-Pak [Azithromycin] Other (See Comments)    Pt can not take with Amiodarone   . Metoprolol Itching and Rash  . Sulfonamide Derivatives Rash  . Toprol Xl [Metoprolol Tartrate] Itching and Rash    Social History   Social History  . Marital Status: Married    Spouse Name: [name]  . Number of Children: 2  . Years of Education: 16   Occupational History  .     Social History Main Topics  . Smoking status: Former Smoker    Types: Cigarettes    Quit date: 12/13/1970  . Smokeless tobacco: Never Used  . Alcohol Use: No  . Drug Use: No  . Sexual Activity: Not on file   Other Topics Concern  . 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   Patient has two children.   Patient is retired.   Patient has a college education.   Patient is right-handed.   Patient does not drink any caffeine.    Family History  Problem Relation Age of Onset  . Heart failure Father   . Hypertension Father   . Dementia Mother     ROS- All systems are reviewed and negative except as per the HPI above  Physical Exam: Filed Vitals:   01/28/16 0941  BP: 148/72  Pulse: 72  Height: 5\' 10"  (1.778 m)  Weight: 266 lb 6.4 oz (120.838 kg)    GEN- The patient is well appearing, alert and oriented x 3 today.   Head- normocephalic, atraumatic Eyes-  Sclera clear, conjunctiva pink Ears- hearing intact Oropharynx- clear Neck- supple, no JVP Lymph- no cervical lymphadenopathy Lungs- Clear to ausculation bilaterally, normal work of breathing Heart- Regular rate and rhythm, no murmurs, rubs or gallops, PMI not laterally displaced GI- soft, NT, ND, + BS Extremities- no clubbing, cyanosis, or edema MS- no significant deformity or atrophy Skin- no rash or  lesion Psych- euthymic mood, full affect Neuro- strength and sensation are intact  EKG- afib at 72 bpm, IRBBB, qrs int 92 ms, qrs 82 ms, qtc 448 ms Epic records reviewed  Assessment and Plan: 1. Afib In afib today, rate controlled at 72 bpm, but pt is not symptomatic today Will place a 30 day event monitor to further understand presyncopal spells of vision dimming for a few seconds starting last week Do not  drive until further evaluated  Bmet/mag today Return in one week If staying in afib, can plan to cardiovert  Taking warfarin, last INR, 2/6, therapeutic at 2.3  2. Htn  Stable  F/u one week and as event monitor directs  Geroge Baseman. Taquilla Downum, [facility] [address] [place], [place] [address] [phone]

## 2016-02-01 ENCOUNTER — Telehealth: Payer: Self-pay

## 2016-02-01 ENCOUNTER — Ambulatory Visit (INDEPENDENT_AMBULATORY_CARE_PROVIDER_SITE_OTHER): Payer: Medicare Other | Admitting: *Deleted

## 2016-02-01 DIAGNOSIS — I4819 Other persistent atrial fibrillation: Secondary | ICD-10-CM

## 2016-02-01 DIAGNOSIS — Z7901 Long term (current) use of anticoagulants: Secondary | ICD-10-CM | POA: Diagnosis not present

## 2016-02-01 DIAGNOSIS — Z5181 Encounter for therapeutic drug level monitoring: Secondary | ICD-10-CM

## 2016-02-01 DIAGNOSIS — I4891 Unspecified atrial fibrillation: Secondary | ICD-10-CM

## 2016-02-01 DIAGNOSIS — I481 Persistent atrial fibrillation: Secondary | ICD-10-CM | POA: Diagnosis not present

## 2016-02-01 DIAGNOSIS — G4733 Obstructive sleep apnea (adult) (pediatric): Secondary | ICD-10-CM | POA: Diagnosis not present

## 2016-02-01 LAB — POCT INR: INR: 2.1

## 2016-02-01 NOTE — Telephone Encounter (Signed)
Received monitor strips from 02/01/16 at 0929 AM (CT). Strip report analysis reads "atrial fibrillation RVR Non Sustained w/ Artifact" with rate of 180.6 bpm.  Left message for patient to call back on both numbers listed in chart.   Strips faxed to [facility] office.

## 2016-02-01 NOTE — Telephone Encounter (Signed)
Called patient, he was out in the yard walking around around 930am when the autotrigger occurred. Afterwards, he sat down and had a strange feeling that he would pass out. He feels fine now. Advised to continue wearing monitor.   Monitor strips were faxed from [facility] office - are in MD mail box for review

## 2016-02-02 MED ORDER — METOPROLOL SUCCINATE ER 25 MG PO TB24
25.0000 mg | ORAL_TABLET | Freq: Every day | ORAL | Status: DC
Start: 2016-02-02 — End: 2016-06-06

## 2016-02-02 NOTE — Telephone Encounter (Signed)
Results referred to Roderic Palau for follow up.

## 2016-02-02 NOTE — Telephone Encounter (Signed)
Discussed with patient/Donna Kayleen Memos NP - decided to restart Toprol 25mg  once a day -- patient reports still has rash months after stopping toprol so do not believe rash was related to drug as first thought.  Patient in agreement to drug addition. Appt Thursday for reck already in place.

## 2016-02-04 ENCOUNTER — Ambulatory Visit (HOSPITAL_COMMUNITY)
Admission: RE | Admit: 2016-02-04 | Discharge: 2016-02-04 | Disposition: A | Discharge status: Home / Self Care | Payer: Medicare Other | Source: Ambulatory Visit | Attending: Nurse Practitioner | Admitting: Nurse Practitioner

## 2016-02-04 ENCOUNTER — Encounter (HOSPITAL_COMMUNITY): Payer: Self-pay | Admitting: Nurse Practitioner

## 2016-02-04 VITALS — BP 134/82 | HR 60 | Wt 269.0 lb

## 2016-02-04 DIAGNOSIS — I480 Paroxysmal atrial fibrillation: Secondary | ICD-10-CM | POA: Diagnosis not present

## 2016-02-04 DIAGNOSIS — I4891 Unspecified atrial fibrillation: Secondary | ICD-10-CM | POA: Diagnosis not present

## 2016-02-04 NOTE — Progress Notes (Signed)
Patient ID: Chad [name], male   DOB: [birth year], 82 y.o.   MRN: [id number]     Primary Care Physician: Purvis Kilts, MD Referring Physician: [facility] f/u   Chad [name] is a 82 y.o. 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 He was  started on tikosyn 9/16 and was seen in the afib clinc for f/u in January. He was in SR, no complaints. He had prolonged qtc that day, reviewed with Dr. Caryl Comes and he felt QTC was ok. EKG repeated one week and qtc acceptable.Pt was not having any  afib that pt was aware of. He had been feeling well. He denies any new drugs or change in previous drugs. No change in general health.  He is being seen in the afib clinic today, 3/16 due to complaints of having vision dim for a few seconds at a time. He feels liks he could pass out. No syncope. Started a couple of weeks ago. He can be sitting or standing. He is not aware of any sensation of heart racing or skipping with episodes. He was aware of heart racing, for short period of time last night but did not have any episodes of vision dimming with it. He has been instructed not to drive until the origin of these episodes can be further determined.  Returns 3/23 after event monitor placed and showed nonsustained afib with v rate up to 180 bpm. This correlated with pt  walking in the yard and feeling lightheaded for a few seconds. Chart was reviewed that showed that toprol was stopped at time of tikosyn loading due to a rash that pt came into the hospital with and had had for some time. Thought toprol was cause of rash. He went on to see dermatologist  and was prescribed a topical preparation  for rash but the rash perisits despite being off BB for over 6 months. He could not tolerate Cardizem in the past due to LLE so toprol was restarted two days ago. I am not aware of any further monitor events since staring BB, and pt denies so far, having any further  presyncopal spells. He will continue to wear the event monitor for another 3 weeks.Otherwise, he has no complaints today.  Today, he denies symptoms of palpitations, chest pain, shortness of breath, orthopnea, PND, lower extremity edema, dizziness, presyncope, syncope, or neurologic sequela. The patient is tolerating medications without difficulties and is otherwise without complaint today.   Past Medical History  Diagnosis Date  . Persistent atrial fibrillation ([place])        . Hypertension   . Hypoglycemia   . Sensory ataxia   . Sleep apnea     CPAP  . Cancer [facility]) 2010    bladder   Past Surgical History  Procedure Laterality Date  . Lumbar disc surgery  1991  . Cardioversion N/A 04/23/2015    Procedure: CARDIOVERSION;  Surgeon: [name], MD;  Location: [facility] ENDOSCOPY;  Service: Cardiovascular;  Laterality: N/A;  . Cardioversion N/A 07/30/2015    Procedure: CARDIOVERSION;  Surgeon: [name], MD;  Location: [facility] ENDOSCOPY;  Service: Cardiovascular;  Laterality: N/A;    Current Outpatient Prescriptions  Medication Sig Dispense Refill  . Calcium-Magnesium 500-250 MG TABS Take 1 tablet by mouth daily.      . Cholecalciferol 1000 UNIT capsule Take 2,000 Units by mouth 2 (two) times daily.     . Coenzyme Q10 (EQL COQ10)  100 MG capsule Take 200 mg by mouth daily.     . Cyanocobalamin (VITAMIN B 12 PO) Take 500 mcg by mouth 2 (two) times daily.     Marland Kitchen dofetilide (TIKOSYN) 250 MCG capsule Take 1 capsule (250 mcg total) by mouth 2 (two) times daily. 180 capsule 3  . escitalopram (LEXAPRO) 10 MG tablet Take 10 mg by mouth at bedtime.     . furosemide (LASIX) 20 MG tablet Take 1 tablet (20 mg total) by mouth daily. 30 tablet 6  . irbesartan (AVAPRO) 300 MG tablet Take 1 tablet (300 mg total) by mouth daily. 90 tablet 1  . metoprolol succinate (TOPROL XL) 25 MG 24 hr tablet Take 1 tablet (25 mg total) by mouth daily. 30 tablet 3  . Multiple Vitamins-Minerals (CENTRUM SILVER PO) Take 1 tablet  by mouth daily.      . OXYGEN Inhale 2 L into the lungs at bedtime. Uses with CPAP machine    . warfarin (COUMADIN) 5 MG tablet Take 1 tablet (5 mg total) by mouth daily. (Patient taking differently: Take 5 mg by mouth daily. Pt take 5 mg on Monday Wednesday and Friday  Take 2.5 mg on Tuesday Thursday and Sunday) 30 tablet 6   No current facility-administered medications for this encounter.    Allergies  Allergen Reactions  . Carbapenems   . Cephalosporins Other (See Comments)  . Chlorpheniramine Other (See Comments)    Swells prostate  . Ciprofloxacin     Pt can not take with amiodarone   . Decongestant [Oxymetazoline]     All decongestant - makes prostate swell  . Diltiazem Swelling    Left leg swelling  . Flagyl [Metronidazole] Nausea Only    Stomach ache  . Hctz [Hydrochlorothiazide]   . Levaquin [Levofloxacin In D5w] Other (See Comments)    unknown  . Levofloxacin Nausea Only  . Lidocaine     Pt unsure of  . Penicillins Swelling  . Teline [Tetracycline] Swelling    Hospitalization for 2 weeks, prostate swelling  . Tetanus Toxoid Swelling    Swells arms, Please pre-medicate with benadryl.  Marland Kitchen Z-Pak [Azithromycin] Other (See Comments)    Pt can not take with Amiodarone   . Metoprolol Itching and Rash  . Sulfonamide Derivatives Rash  . Toprol Xl [Metoprolol Tartrate] Itching and Rash    Social History   Social History  . Marital Status: Married    Spouse Name: [name]  . Number of Children: 2  . Years of Education: 16   Occupational History  .     Social History Main Topics  . Smoking status: Former Smoker    Types: Cigarettes    Quit date: 12/13/1970  . Smokeless tobacco: Never Used  . Alcohol Use: No  . Drug Use: No  . Sexual Activity: Not on file   Other Topics Concern  . 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   Patient has two children.   Patient is retired.   Patient has a college  education.   Patient is right-handed.   Patient does not drink any caffeine.    Family History  Problem Relation Age of Onset  . Heart failure Father   . Hypertension Father   . Dementia Mother     ROS- All systems are reviewed and negative except as per the HPI above  Physical Exam: Filed Vitals:   02/04/16 1038  BP:  134/82  Pulse: 60  Weight: 269 lb (122.018 kg)  SpO2: 95%    GEN- The patient is well appearing, alert and oriented x 3 today.   Head- normocephalic, atraumatic Eyes-  Sclera clear, conjunctiva pink Ears- hearing intact Oropharynx- clear Neck- supple, no JVP Lymph- no cervical lymphadenopathy Lungs- Clear to ausculation bilaterally, normal work of breathing Heart- Regular rate and rhythm, no murmurs, rubs or gallops, PMI not laterally displaced GI- soft, NT, ND, + BS Extremities- no clubbing, cyanosis, or edema MS- no significant deformity or atrophy Skin- no rash or lesion Psych- euthymic mood, full affect Neuro- strength and sensation are intact  EKG- Sinus brady at 54 bpm, with PAC's, IRBBB, pr int 194 bpm, qrs int 94 ms, qtc 466 ms I reviewed EKG with Dr. Rayann Heman form last visit 3/16 that default reading was afib but Dr. Rayann Heman believes that it was a SR  Epic records reviewed Bmet/mag drawn last week  with normal K+/mag  Assessment and Plan: 1. Presyncope/afib Continue to wear event monitor, which did show one event that correlated with pt symptoms of being lightheaded and showed nonsustained afib at 180 bpm Toprol XL 25 mg started to offset afib with rvr Do not drive until further evaluated   2. Loletha Grayer Will have to watch for symptomatic bradycardia with BB back on board So far, tolerating No change in rash with BB back on board Could cut BB in half if need be May be early tachy/brady syndrome  3. Htn  Stable  F/u after monitor is finished in May, but will be seen earlier as symptoms/ event monitor dictates  Butch Penny C. Evelina Lore, [facility] [address] [place], [place] [address] [phone]

## 2016-02-18 ENCOUNTER — Telehealth: Payer: Self-pay | Admitting: Cardiology

## 2016-02-18 NOTE — Telephone Encounter (Signed)
OK'ed change in manufacturer. [facility] notified.

## 2016-02-18 NOTE — Telephone Encounter (Signed)
New message    requesting permission to change warfarin brand to -[facility]

## 2016-03-03 DIAGNOSIS — I4891 Unspecified atrial fibrillation: Secondary | ICD-10-CM | POA: Diagnosis not present

## 2016-03-03 DIAGNOSIS — G4733 Obstructive sleep apnea (adult) (pediatric): Secondary | ICD-10-CM | POA: Diagnosis not present

## 2016-03-16 ENCOUNTER — Ambulatory Visit (HOSPITAL_COMMUNITY): Payer: Medicare Other | Admitting: Nurse Practitioner

## 2016-03-22 ENCOUNTER — Other Ambulatory Visit: Payer: Self-pay | Admitting: Dermatology

## 2016-03-22 DIAGNOSIS — L570 Actinic keratosis: Secondary | ICD-10-CM | POA: Diagnosis not present

## 2016-03-22 DIAGNOSIS — L820 Inflamed seborrheic keratosis: Secondary | ICD-10-CM | POA: Diagnosis not present

## 2016-03-22 DIAGNOSIS — L821 Other seborrheic keratosis: Secondary | ICD-10-CM | POA: Diagnosis not present

## 2016-03-22 DIAGNOSIS — D485 Neoplasm of uncertain behavior of skin: Secondary | ICD-10-CM | POA: Diagnosis not present

## 2016-03-22 DIAGNOSIS — L439 Lichen planus, unspecified: Secondary | ICD-10-CM | POA: Diagnosis not present

## 2016-03-23 ENCOUNTER — Ambulatory Visit (INDEPENDENT_AMBULATORY_CARE_PROVIDER_SITE_OTHER): Payer: Medicare Other | Admitting: *Deleted

## 2016-03-23 ENCOUNTER — Encounter (HOSPITAL_COMMUNITY): Payer: Self-pay | Admitting: Nurse Practitioner

## 2016-03-23 ENCOUNTER — Ambulatory Visit (HOSPITAL_COMMUNITY)
Admission: RE | Admit: 2016-03-23 | Discharge: 2016-03-23 | Disposition: A | Discharge status: Home / Self Care | Payer: Medicare Other | Source: Ambulatory Visit | Attending: Nurse Practitioner | Admitting: Nurse Practitioner

## 2016-03-23 VITALS — BP 148/76 | HR 56 | Ht 70.0 in | Wt 261.2 lb

## 2016-03-23 DIAGNOSIS — I4891 Unspecified atrial fibrillation: Secondary | ICD-10-CM

## 2016-03-23 DIAGNOSIS — Z0189 Encounter for other specified special examinations: Secondary | ICD-10-CM | POA: Diagnosis not present

## 2016-03-23 DIAGNOSIS — I481 Persistent atrial fibrillation: Secondary | ICD-10-CM | POA: Insufficient documentation

## 2016-03-23 DIAGNOSIS — I4510 Unspecified right bundle-branch block: Secondary | ICD-10-CM | POA: Insufficient documentation

## 2016-03-23 DIAGNOSIS — I480 Paroxysmal atrial fibrillation: Secondary | ICD-10-CM | POA: Diagnosis not present

## 2016-03-23 DIAGNOSIS — E162 Hypoglycemia, unspecified: Secondary | ICD-10-CM | POA: Insufficient documentation

## 2016-03-23 DIAGNOSIS — R001 Bradycardia, unspecified: Secondary | ICD-10-CM | POA: Insufficient documentation

## 2016-03-23 DIAGNOSIS — Z87891 Personal history of nicotine dependence: Secondary | ICD-10-CM | POA: Insufficient documentation

## 2016-03-23 DIAGNOSIS — I10 Essential (primary) hypertension: Secondary | ICD-10-CM | POA: Diagnosis not present

## 2016-03-23 DIAGNOSIS — Z7901 Long term (current) use of anticoagulants: Secondary | ICD-10-CM

## 2016-03-23 DIAGNOSIS — R55 Syncope and collapse: Secondary | ICD-10-CM | POA: Diagnosis not present

## 2016-03-23 DIAGNOSIS — R270 Ataxia, unspecified: Secondary | ICD-10-CM | POA: Insufficient documentation

## 2016-03-23 DIAGNOSIS — Z5181 Encounter for therapeutic drug level monitoring: Secondary | ICD-10-CM | POA: Diagnosis not present

## 2016-03-23 DIAGNOSIS — I4819 Other persistent atrial fibrillation: Secondary | ICD-10-CM

## 2016-03-23 DIAGNOSIS — G4730 Sleep apnea, unspecified: Secondary | ICD-10-CM | POA: Insufficient documentation

## 2016-03-23 LAB — BASIC METABOLIC PANEL
ANION GAP: 5 (ref 5–15)
BUN: 15 mg/dL (ref 6–20)
CHLORIDE: 106 mmol/L (ref 101–111)
CO2: 30 mmol/L (ref 22–32)
CREATININE: 1.05 mg/dL (ref 0.61–1.24)
Calcium: 9.4 mg/dL (ref 8.9–10.3)
GLUCOSE: 73 mg/dL (ref 65–99)
POTASSIUM: 4.2 mmol/L (ref 3.5–5.1)
SODIUM: 141 mmol/L (ref 135–145)

## 2016-03-23 LAB — MAGNESIUM: Magnesium: 2.2 mg/dL (ref 1.7–2.4)

## 2016-03-23 LAB — POCT INR: INR: 2.1

## 2016-03-23 NOTE — Progress Notes (Signed)
Patient ID: Chad [name], male   DOB: [birth year], 82 y.o.   MRN: [id number]     Primary Care Physician: Purvis Kilts, MD Referring Physician: [facility] f/u   Chad [name] is a 82 y.o. 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 He was  started on tikosyn 9/16 and was seen in the afib clinc for f/u in January. He was in SR, no complaints. He had prolonged qtc that day, reviewed with Dr. Caryl Comes and he felt QTC was ok. EKG repeated one week and qtc acceptable.Pt was not having any  afib that pt was aware of. He had been feeling well. He denies any new drugs or change in previous drugs. No change in general health.  He was seen in the afib clinic today, 3/16 due to complaints of having vision dim for a few seconds at a time. He feels liks he could pass out. No syncope. Started a couple of weeks ago. He can be sitting or standing. He is not aware of any sensation of heart racing or skipping with episodes. He was aware of heart racing, for short period of time last night but did not have any episodes of vision dimming with it. He has been instructed not to drive until the origin of these episodes can be further determined.30 day monitor was placed. Within a few days of wearing the monitor, an episode of elevated heart beat was noted, afib with RVR. Metoprolol was added at 25 mg a day and for the remainder of the monitor, there was no further elevated heart rate or episodes of feeling like he would pass out. No significant brady or pauses. He does continue to have some episodes of afib but does not feel it.  Today, he denies symptoms of palpitations, chest pain, shortness of breath, orthopnea, PND, lower extremity edema, dizziness, presyncope, syncope, or neurologic sequela. The patient is tolerating medications without difficulties and is otherwise without complaint today.   Past Medical History  Diagnosis Date  . Persistent atrial  fibrillation ([place])        . Hypertension   . Hypoglycemia   . Sensory ataxia   . Sleep apnea     CPAP  . Cancer [facility]) 2010    bladder   Past Surgical History  Procedure Laterality Date  . Lumbar disc surgery  1991  . Cardioversion N/A 04/23/2015    Procedure: CARDIOVERSION;  Surgeon: [name], MD;  Location: [facility] ENDOSCOPY;  Service: Cardiovascular;  Laterality: N/A;  . Cardioversion N/A 07/30/2015    Procedure: CARDIOVERSION;  Surgeon: [name], MD;  Location: [facility] ENDOSCOPY;  Service: Cardiovascular;  Laterality: N/A;    Current Outpatient Prescriptions  Medication Sig Dispense Refill  . Calcium-Magnesium 500-250 MG TABS Take 1 tablet by mouth daily.      . Cholecalciferol 1000 UNIT capsule Take 2,000 Units by mouth 2 (two) times daily.     . Coenzyme Q10 (EQL COQ10) 100 MG capsule Take 200 mg by mouth daily.     . Cyanocobalamin (VITAMIN B 12 PO) Take 500 mcg by mouth 2 (two) times daily.     Marland Kitchen dofetilide (TIKOSYN) 250 MCG capsule Take 1 capsule (250 mcg total) by mouth 2 (two) times daily. 180 capsule 3  . escitalopram (LEXAPRO) 10 MG tablet Take 10 mg by mouth at bedtime.     . furosemide (LASIX) 20 MG tablet Take 1 tablet (20 mg total)  by mouth daily. 30 tablet 6  . irbesartan (AVAPRO) 300 MG tablet Take 1 tablet (300 mg total) by mouth daily. 90 tablet 1  . metoprolol succinate (TOPROL XL) 25 MG 24 hr tablet Take 1 tablet (25 mg total) by mouth daily. 30 tablet 3  . Multiple Vitamins-Minerals (CENTRUM SILVER PO) Take 1 tablet by mouth daily.      . OXYGEN Inhale 2 L into the lungs at bedtime. Uses with CPAP machine    . warfarin (COUMADIN) 5 MG tablet Take 1 tablet (5 mg total) by mouth daily. (Patient taking differently: Take 5 mg by mouth daily. Pt take 5 mg on Monday Wednesday and Friday  Take 2.5 mg on Tuesday Thursday and Sunday) 30 tablet 6   No current facility-administered medications for this encounter.    Allergies  Allergen Reactions  . Carbapenems   .  Cephalosporins Other (See Comments)  . Chlorpheniramine Other (See Comments)    Swells prostate  . Ciprofloxacin     Pt can not take with amiodarone   . Decongestant [Oxymetazoline]     All decongestant - makes prostate swell  . Diltiazem Swelling    Left leg swelling  . Flagyl [Metronidazole] Nausea Only    Stomach ache  . Hctz [Hydrochlorothiazide]   . Levaquin [Levofloxacin In D5w] Other (See Comments)    unknown  . Levofloxacin Nausea Only  . Lidocaine     Pt unsure of  . Penicillins Swelling  . Teline [Tetracycline] Swelling    Hospitalization for 2 weeks, prostate swelling  . Tetanus Toxoid Swelling    Swells arms, Please pre-medicate with benadryl.  Marland Kitchen Z-Pak [Azithromycin] Other (See Comments)    Pt can not take with Amiodarone   . Metoprolol Itching and Rash  . Sulfonamide Derivatives Rash  . Toprol Xl [Metoprolol Tartrate] Itching and Rash    Social History   Social History  . Marital Status: Married    Spouse Name: [name]  . Number of Children: 2  . Years of Education: 16   Occupational History  .     Social History Main Topics  . Smoking status: Former Smoker    Types: Cigarettes    Quit date: 12/13/1970  . Smokeless tobacco: Never Used  . Alcohol Use: No  . Drug Use: No  . Sexual Activity: Not on file   Other Topics Concern  . 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   Patient has two children.   Patient is retired.   Patient has a college education.   Patient is right-handed.   Patient does not drink any caffeine.    Family History  Problem Relation Age of Onset  . Heart failure Father   . Hypertension Father   . Dementia Mother     ROS- All systems are reviewed and negative except as per the HPI above  Physical Exam: Filed Vitals:   03/23/16 1028  BP: 148/76  Pulse: 56  Height: 5\' 10"  (1.778 m)  Weight: 261 lb 3.2 oz (118.48 kg)    GEN- The patient is well appearing,  alert and oriented x 3 today.   Head- normocephalic, atraumatic Eyes-  Sclera clear, conjunctiva pink Ears- hearing intact Oropharynx- clear Neck- supple, no JVP Lymph- no cervical lymphadenopathy Lungs- Clear to ausculation bilaterally, normal work of breathing Heart- Regular rate and rhythm, no murmurs, rubs or gallops, PMI not laterally displaced GI- soft, NT,  ND, + BS Extremities- no clubbing, cyanosis, or edema MS- no significant deformity or atrophy Skin- no rash or lesion Psych- euthymic mood, full affect Neuro- strength and sensation are intact  EKG- S brady at 56 bpm, pr int 162 bpm, qrs int 94 ms, qtc 438 ms Epic records reviewed 30 day monitor- interpreted by Dr. Percival Spanish Study Highlights    Intermittent atrial fib with tachy and brady episodes but no sustained pauses. Intermittent NSR with sinus bradycardia as well.     Assessment and Plan: 1.  Presyncopal episodes Resolved with addition of metoprolol, thought to be secondary to fast heart rate Some afib on monitor but asymptomatic No other significant abnormalities noted on monitor Continue tikosyn Bmet/cbc today  2. Htn  Stable  F/u in 3 months for tikosyn surveillance and Dr. Percival Spanish in 6 months.  Geroge Baseman Daiquan Resnik, [facility] [address] [place], [place] [address] [phone]

## 2016-04-02 DIAGNOSIS — G4733 Obstructive sleep apnea (adult) (pediatric): Secondary | ICD-10-CM | POA: Diagnosis not present

## 2016-04-02 DIAGNOSIS — I4891 Unspecified atrial fibrillation: Secondary | ICD-10-CM | POA: Diagnosis not present

## 2016-04-07 DIAGNOSIS — Z6839 Body mass index (BMI) 39.0-39.9, adult: Secondary | ICD-10-CM | POA: Diagnosis not present

## 2016-04-07 DIAGNOSIS — J069 Acute upper respiratory infection, unspecified: Secondary | ICD-10-CM | POA: Diagnosis not present

## 2016-04-07 DIAGNOSIS — Z1389 Encounter for screening for other disorder: Secondary | ICD-10-CM | POA: Diagnosis not present

## 2016-05-03 DIAGNOSIS — G4733 Obstructive sleep apnea (adult) (pediatric): Secondary | ICD-10-CM | POA: Diagnosis not present

## 2016-05-03 DIAGNOSIS — I4891 Unspecified atrial fibrillation: Secondary | ICD-10-CM | POA: Diagnosis not present

## 2016-05-04 ENCOUNTER — Ambulatory Visit (INDEPENDENT_AMBULATORY_CARE_PROVIDER_SITE_OTHER): Payer: Medicare Other | Admitting: *Deleted

## 2016-05-04 DIAGNOSIS — I4819 Other persistent atrial fibrillation: Secondary | ICD-10-CM

## 2016-05-04 DIAGNOSIS — Z5181 Encounter for therapeutic drug level monitoring: Secondary | ICD-10-CM

## 2016-05-04 DIAGNOSIS — I481 Persistent atrial fibrillation: Secondary | ICD-10-CM

## 2016-05-04 LAB — POCT INR: INR: 2.8

## 2016-06-02 DIAGNOSIS — I4891 Unspecified atrial fibrillation: Secondary | ICD-10-CM | POA: Diagnosis not present

## 2016-06-02 DIAGNOSIS — G4733 Obstructive sleep apnea (adult) (pediatric): Secondary | ICD-10-CM | POA: Diagnosis not present

## 2016-06-06 ENCOUNTER — Other Ambulatory Visit: Payer: Self-pay | Admitting: Nurse Practitioner

## 2016-06-06 ENCOUNTER — Other Ambulatory Visit: Payer: Self-pay | Admitting: Physician Assistant

## 2016-06-07 NOTE — Telephone Encounter (Signed)
REFILL 

## 2016-06-15 ENCOUNTER — Ambulatory Visit (INDEPENDENT_AMBULATORY_CARE_PROVIDER_SITE_OTHER): Payer: Medicare Other | Admitting: *Deleted

## 2016-06-15 DIAGNOSIS — Z5181 Encounter for therapeutic drug level monitoring: Secondary | ICD-10-CM | POA: Diagnosis not present

## 2016-06-15 DIAGNOSIS — I4819 Other persistent atrial fibrillation: Secondary | ICD-10-CM

## 2016-06-15 DIAGNOSIS — I481 Persistent atrial fibrillation: Secondary | ICD-10-CM | POA: Diagnosis not present

## 2016-06-15 LAB — POCT INR: INR: 3.4

## 2016-06-21 ENCOUNTER — Encounter (HOSPITAL_COMMUNITY): Payer: Self-pay | Admitting: Nurse Practitioner

## 2016-06-21 ENCOUNTER — Ambulatory Visit (HOSPITAL_COMMUNITY)
Admission: RE | Admit: 2016-06-21 | Discharge: 2016-06-21 | Disposition: A | Discharge status: Home / Self Care | Payer: Medicare Other | Source: Ambulatory Visit | Attending: Nurse Practitioner | Admitting: Nurse Practitioner

## 2016-06-21 VITALS — BP 144/70 | HR 52

## 2016-06-21 DIAGNOSIS — Z8249 Family history of ischemic heart disease and other diseases of the circulatory system: Secondary | ICD-10-CM | POA: Insufficient documentation

## 2016-06-21 DIAGNOSIS — Z87891 Personal history of nicotine dependence: Secondary | ICD-10-CM | POA: Insufficient documentation

## 2016-06-21 DIAGNOSIS — Z79899 Other long term (current) drug therapy: Secondary | ICD-10-CM | POA: Insufficient documentation

## 2016-06-21 DIAGNOSIS — I4819 Other persistent atrial fibrillation: Secondary | ICD-10-CM

## 2016-06-21 DIAGNOSIS — Z880 Allergy status to penicillin: Secondary | ICD-10-CM | POA: Diagnosis not present

## 2016-06-21 DIAGNOSIS — Z881 Allergy status to other antibiotic agents status: Secondary | ICD-10-CM | POA: Insufficient documentation

## 2016-06-21 DIAGNOSIS — Z8551 Personal history of malignant neoplasm of bladder: Secondary | ICD-10-CM | POA: Diagnosis not present

## 2016-06-21 DIAGNOSIS — Z882 Allergy status to sulfonamides status: Secondary | ICD-10-CM | POA: Diagnosis not present

## 2016-06-21 DIAGNOSIS — Z7901 Long term (current) use of anticoagulants: Secondary | ICD-10-CM | POA: Diagnosis not present

## 2016-06-21 DIAGNOSIS — I10 Essential (primary) hypertension: Secondary | ICD-10-CM | POA: Diagnosis not present

## 2016-06-21 DIAGNOSIS — G4730 Sleep apnea, unspecified: Secondary | ICD-10-CM | POA: Diagnosis not present

## 2016-06-21 DIAGNOSIS — I481 Persistent atrial fibrillation: Secondary | ICD-10-CM

## 2016-06-21 LAB — BASIC METABOLIC PANEL
Anion gap: 5 (ref 5–15)
BUN: 14 mg/dL (ref 6–20)
CALCIUM: 9.3 mg/dL (ref 8.9–10.3)
CHLORIDE: 103 mmol/L (ref 101–111)
CO2: 30 mmol/L (ref 22–32)
CREATININE: 1.12 mg/dL (ref 0.61–1.24)
GFR calc Af Amer: >60 mL/min (ref >60)
GFR calc non Af Amer: 58 mL/min — ABNORMAL LOW (ref >60)
GLUCOSE: 101 mg/dL — AB (ref 65–99)
POTASSIUM: 4.6 mmol/L (ref 3.5–5.1)
Sodium: 138 mmol/L (ref 135–145)

## 2016-06-21 LAB — MAGNESIUM: Magnesium: 2.2 mg/dL (ref 1.7–2.4)

## 2016-06-21 NOTE — Progress Notes (Signed)
Patient ID: Chad [name], male   DOB: [birth year], 82 y.o.   MRN: [id number]     Primary Care Physician: Purvis Kilts, MD Referring Physician: [facility] f/u   Chad [name] is a 82 y.o. 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 He was  started on tikosyn 9/16 and was seen in the afib clinc for f/u in January. He was in SR, no complaints. He had prolonged qtc that day, reviewed with Dr. Caryl Comes and he felt QTC was ok. EKG repeated one week and qtc acceptable.Pt was not having any  afib that pt was aware of. He had been feeling well. He denies any new drugs or change in previous drugs. No change in general health.  He was seen in the afib clinic today, 3/16 due to complaints of having vision dim for a few seconds at a time. He feels liks he could pass out. No syncope. Started a couple of weeks ago. He can be sitting or standing. He is not aware of any sensation of heart racing or skipping with episodes. He was aware of heart racing, for short period of time last night but did not have any episodes of vision dimming with it. He has been instructed not to drive until the origin of these episodes can be further determined.30 day monitor was placed. Within a few days of wearing the monitor, an episode of elevated heart beat was noted, afib with RVR. Metoprolol was added at 25 mg a day and for the remainder of the monitor, there was no further elevated heart rate or episodes of feeling like he would pass out. No significant brady or pauses.   Returns 8/8 and feels well. No episodes of afib that he is aware of and no further episodes of RVR causing presyncopal rhythms, since starting back on metoprolol ER 25 mg a day. HR is office is 52 bpm, but he is not symptomatic with this. Has some issues with peripheral neuropathy but no falls. Continues on tikosyn bid and is compliant with warfarin.  Today, he denies symptoms of palpitations, chest  pain, shortness of breath, orthopnea, PND, lower extremity edema, dizziness, presyncope, syncope, or neurologic sequela. The patient is tolerating medications without difficulties and is otherwise without complaint today.   Past Medical History:  Diagnosis Date  . Cancer ([place]) 2010   bladder  . Hypertension   . Hypoglycemia   . Persistent atrial fibrillation ([place])       . Sensory ataxia   . Sleep apnea    CPAP   Past Surgical History:  Procedure Laterality Date  . CARDIOVERSION N/A 04/23/2015   Procedure: CARDIOVERSION;  Surgeon: [name], MD;  Location: [place];  Service: Cardiovascular;  Laterality: N/A;  . CARDIOVERSION N/A 07/30/2015   Procedure: CARDIOVERSION;  Surgeon: [name], MD;  Location: [place];  Service: Cardiovascular;  Laterality: N/A;  . LUMBAR [place]    Current Outpatient Prescriptions  Medication Sig Dispense Refill  . Calcium-Magnesium 500-250 MG TABS Take 1 tablet by mouth daily.      . Cholecalciferol 1000 UNIT capsule Take 2,000 Units by mouth 2 (two) times daily.     . Coenzyme Q10 (EQL COQ10) 100 MG capsule Take 200 mg by mouth daily.     . Cyanocobalamin (VITAMIN B 12 PO) Take 500 mcg by mouth 2 (two) times daily.     Marland Kitchen dofetilide (TIKOSYN) 250  MCG capsule Take 1 capsule (250 mcg total) by mouth 2 (two) times daily. 180 capsule 3  . escitalopram (LEXAPRO) 10 MG tablet Take 10 mg by mouth at bedtime.     . furosemide (LASIX) 20 MG tablet TAKE ONE TABLET BY MOUTH ONCE DAILY (Patient taking differently: as needed) 158 tablet 0  . irbesartan (AVAPRO) 300 MG tablet Take 1 tablet (300 mg total) by mouth daily. 90 tablet 1  . metoprolol succinate (TOPROL-XL) 25 MG 24 hr tablet TAKE ONE TABLET BY MOUTH ONCE DAILY 30 tablet 6  . Multiple Vitamins-Minerals (CENTRUM SILVER PO) Take 1 tablet by mouth daily.      . OXYGEN Inhale 2 L into the lungs at bedtime. Uses with CPAP machine    . warfarin (COUMADIN) 5 MG tablet Take 1 tablet (5 mg  total) by mouth daily. (Patient taking differently: Take 5 mg by mouth daily. Pt take 5 mg on Monday Wednesday and Friday  Take 2.5 mg on Tuesday Thursday and Sunday) 30 tablet 6   No current facility-administered medications for this encounter.     Allergies  Allergen Reactions  . Carbapenems   . Cephalosporins Other (See Comments)  . Chlorpheniramine Other (See Comments)    Swells prostate  . Ciprofloxacin     Pt can not take with amiodarone   . Decongestant [Oxymetazoline]     All decongestant - makes prostate swell  . Diltiazem Swelling    Left leg swelling  . Flagyl [Metronidazole] Nausea Only    Stomach ache  . Hctz [Hydrochlorothiazide]   . Levaquin [Levofloxacin In D5w] Other (See Comments)    unknown  . Levofloxacin Nausea Only  . Lidocaine     Pt unsure of  . Penicillins Swelling  . Teline [Tetracycline] Swelling    Hospitalization for 2 weeks, prostate swelling  . Tetanus Toxoid Swelling    Swells arms, Please pre-medicate with benadryl.  Marland Kitchen Z-Pak [Azithromycin] Other (See Comments)    Pt can not take with Amiodarone   . Metoprolol Itching and Rash  . Sulfonamide Derivatives Rash  . Toprol Xl [Metoprolol Tartrate] Itching and Rash    Social History   Social History  . Marital status: Married    Spouse name: [name]  . Number of children: 2  . Years of education: 16   Occupational History  .  Retired   Social History Main Topics  . Smoking status: Former Smoker    Types: Cigarettes    Quit date: 12/13/1970  . Smokeless tobacco: Never Used  . Alcohol use No  . Drug use: No  . Sexual activity: Not on file   Other Topics Concern  . 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   Patient has two children.   Patient is retired.   Patient has a college education.   Patient is right-handed.   Patient does not drink any caffeine.    Family History  Problem Relation Age of Onset  . Heart failure  Father   . Hypertension Father   . Dementia Mother     ROS- All systems are reviewed and negative except as per the HPI above  Physical Exam: Vitals:   06/21/16 1225  BP: (!) 144/70  Pulse: (!) 52    GEN- The patient is well appearing, alert and oriented x 3 today.   Head- normocephalic, atraumatic Eyes-  Sclera clear, conjunctiva pink Ears- hearing intact Oropharynx-  clear Neck- supple, no JVP Lymph- no cervical lymphadenopathy Lungs- Clear to ausculation bilaterally, normal work of breathing Heart- Regular rate and rhythm, no murmurs, rubs or gallops, PMI not laterally displaced GI- soft, NT, ND, + BS Extremities- no clubbing, cyanosis, or edema MS- no significant deformity or atrophy Skin- no rash or lesion Psych- euthymic mood, full affect Neuro- strength and sensation are intact  EKG- S brady at 52 bpm, pr int 162 bpm, qrs int 94 ms, qtc 438 ms Epic records reviewed 30 day monitor- interpreted by Dr. Percival Spanish Study Highlights    Intermittent atrial fib with tachy and brady episodes but no sustained pauses. Intermittent NSR with sinus bradycardia as well.     Assessment and Plan: 1. afib with previous presyncopal episodes Resolved with addition of metoprolol, thought to be secondary to fast heart rate Some afib on monitor but asymptomatic  No significant brady/pauses on monitor Continue tikosyn Bmet/cbc today  2. Htn  Stable  F/u Dr. Percival Spanish in November Afib clinic as needed for surveillance of [place]. [facility] [place] [facility] [address] [place], [place] [address] [phone]          .af

## 2016-06-23 ENCOUNTER — Other Ambulatory Visit: Payer: Self-pay | Admitting: Dermatology

## 2016-06-23 DIAGNOSIS — D485 Neoplasm of uncertain behavior of skin: Secondary | ICD-10-CM | POA: Diagnosis not present

## 2016-06-23 DIAGNOSIS — L439 Lichen planus, unspecified: Secondary | ICD-10-CM | POA: Diagnosis not present

## 2016-06-23 DIAGNOSIS — L570 Actinic keratosis: Secondary | ICD-10-CM | POA: Diagnosis not present

## 2016-06-28 ENCOUNTER — Other Ambulatory Visit: Payer: Self-pay | Admitting: Cardiology

## 2016-07-03 DIAGNOSIS — G4733 Obstructive sleep apnea (adult) (pediatric): Secondary | ICD-10-CM | POA: Diagnosis not present

## 2016-07-03 DIAGNOSIS — I4891 Unspecified atrial fibrillation: Secondary | ICD-10-CM | POA: Diagnosis not present

## 2016-07-13 ENCOUNTER — Ambulatory Visit (INDEPENDENT_AMBULATORY_CARE_PROVIDER_SITE_OTHER): Payer: Medicare Other | Admitting: *Deleted

## 2016-07-13 DIAGNOSIS — I481 Persistent atrial fibrillation: Secondary | ICD-10-CM

## 2016-07-13 DIAGNOSIS — I4819 Other persistent atrial fibrillation: Secondary | ICD-10-CM

## 2016-07-13 DIAGNOSIS — Z5181 Encounter for therapeutic drug level monitoring: Secondary | ICD-10-CM

## 2016-07-13 LAB — POCT INR: INR: 3.6

## 2016-07-29 ENCOUNTER — Other Ambulatory Visit: Payer: Self-pay | Admitting: Cardiology

## 2016-07-29 ENCOUNTER — Telehealth: Payer: Self-pay | Admitting: Cardiology

## 2016-07-29 MED ORDER — DOFETILIDE 250 MCG PO CAPS: 250.0000 ug | ORAL_CAPSULE | Freq: Two times a day (BID) | ORAL | 3 refills | Status: DC

## 2016-07-29 NOTE — Telephone Encounter (Signed)
Rx request sent to pharmacy.  

## 2016-07-29 NOTE — Telephone Encounter (Signed)
New message        *STAT* If patient is at the pharmacy, call can be transferred to refill team.   1. Which medications need to be refilled? (please list name of each medication and dose if known) generic tikosyn 2. Which pharmacy/location (including street and city if local pharmacy) is medication to be sent to? [facility] in [place] 3. Do they need a 30 day or 90 day supply? [place]

## 2016-08-03 DIAGNOSIS — G4733 Obstructive sleep apnea (adult) (pediatric): Secondary | ICD-10-CM | POA: Diagnosis not present

## 2016-08-03 DIAGNOSIS — I4891 Unspecified atrial fibrillation: Secondary | ICD-10-CM | POA: Diagnosis not present

## 2016-08-15 ENCOUNTER — Ambulatory Visit (INDEPENDENT_AMBULATORY_CARE_PROVIDER_SITE_OTHER): Payer: Medicare Other | Admitting: *Deleted

## 2016-08-15 DIAGNOSIS — I481 Persistent atrial fibrillation: Secondary | ICD-10-CM

## 2016-08-15 DIAGNOSIS — Z5181 Encounter for therapeutic drug level monitoring: Secondary | ICD-10-CM

## 2016-08-15 DIAGNOSIS — I4819 Other persistent atrial fibrillation: Secondary | ICD-10-CM

## 2016-08-15 LAB — POCT INR: INR: 1.9

## 2016-08-25 ENCOUNTER — Emergency Department (HOSPITAL_COMMUNITY)
Admission: EM | Admit: 2016-08-25 | Discharge: 2016-08-25 | Disposition: A | Discharge status: Home / Self Care | Payer: Medicare Other | Attending: Emergency Medicine | Admitting: Emergency Medicine

## 2016-08-25 ENCOUNTER — Encounter (HOSPITAL_COMMUNITY): Payer: Self-pay | Admitting: *Deleted

## 2016-08-25 DIAGNOSIS — Z87891 Personal history of nicotine dependence: Secondary | ICD-10-CM | POA: Insufficient documentation

## 2016-08-25 DIAGNOSIS — R040 Epistaxis: Secondary | ICD-10-CM | POA: Insufficient documentation

## 2016-08-25 DIAGNOSIS — Z79899 Other long term (current) drug therapy: Secondary | ICD-10-CM | POA: Diagnosis not present

## 2016-08-25 DIAGNOSIS — I10 Essential (primary) hypertension: Secondary | ICD-10-CM | POA: Diagnosis not present

## 2016-08-25 DIAGNOSIS — Z7901 Long term (current) use of anticoagulants: Secondary | ICD-10-CM | POA: Diagnosis not present

## 2016-08-25 LAB — PROTIME-INR
INR: 2.16
Prothrombin Time: 24.4 seconds — ABNORMAL HIGH (ref 11.4–15.2)

## 2016-08-25 LAB — BASIC METABOLIC PANEL
ANION GAP: 7 (ref 5–15)
BUN: 10 mg/dL (ref 6–20)
CHLORIDE: 103 mmol/L (ref 101–111)
CO2: 27 mmol/L (ref 22–32)
Calcium: 9.3 mg/dL (ref 8.9–10.3)
Creatinine, Ser: 1.18 mg/dL (ref 0.61–1.24)
GFR calc Af Amer: >60 mL/min (ref >60)
GFR, EST NON AFRICAN AMERICAN: 54 mL/min — AB (ref >60)
GLUCOSE: 103 mg/dL — AB (ref 65–99)
POTASSIUM: 4.4 mmol/L (ref 3.5–5.1)
Sodium: 137 mmol/L (ref 135–145)

## 2016-08-25 LAB — CBC WITH DIFFERENTIAL/PLATELET
BASOS ABS: 0 10*3/uL (ref 0.0–0.1)
Basophils Relative: 0 %
Eosinophils Absolute: 0.2 10*3/uL (ref 0.0–0.7)
Eosinophils Relative: 3 %
HEMATOCRIT: 46.8 % (ref 39.0–52.0)
Hemoglobin: 15.6 g/dL (ref 13.0–17.0)
LYMPHS ABS: 1.3 10*3/uL (ref 0.7–4.0)
LYMPHS PCT: 25 %
MCH: 29.7 pg (ref 26.0–34.0)
MCHC: 33.3 g/dL (ref 30.0–36.0)
MCV: 89 fL (ref 78.0–100.0)
MONO ABS: 0.6 10*3/uL (ref 0.1–1.0)
Monocytes Relative: 11 %
NEUTROS ABS: 3.3 10*3/uL (ref 1.7–7.7)
Neutrophils Relative %: 61 %
Platelets: 161 10*3/uL (ref 150–400)
RBC: 5.26 MIL/uL (ref 4.22–5.81)
RDW: 14.4 % (ref 11.5–15.5)
WBC: 5.4 10*3/uL (ref 4.0–10.5)

## 2016-08-25 MED ORDER — SODIUM CHLORIDE 0.9 % IV SOLN
INTRAVENOUS | Status: DC
  Administered 2016-08-25: 15:00:00 via INTRAVENOUS

## 2016-08-25 NOTE — ED Notes (Signed)
Pt has pressure applied to his nose at this time.

## 2016-08-25 NOTE — ED Triage Notes (Signed)
Pt reports nosebleed that started 15-20 min PTA. Pt states he was straining to have a BM when the nosebleed started.

## 2016-08-25 NOTE — ED Provider Notes (Signed)
[facility] DEPT Provider Note   CSN: [id number] Arrival date & time: 08/25/16  1339     History   Chief Complaint Chief Complaint  Patient presents with  . Epistaxis    HPI Chad [name] is a 82 y.o. male.  He presents for evaluation of nose bleeding which started about 20 minutes prior to arrival, as he was straining to have a bowel movement. He takes Coumadin, and follows his INR closely. His last INR was about 10 days ago at which time it was somewhat low at 1.9. Because of that, his provider. Advised him to take a single dose of 5 mg, then resume his usual dosing. He feels otherwise normal and denies other illnesses. He has been eating some more salads recently, then usual. He denies weakness, dizziness, nausea, vomiting, headache or fever. He is taking his usual medications. There are no other known modifying factors.  HPI  Past Medical History:  Diagnosis Date  . Cancer ([place]) 2010   bladder  . Hypertension   . Hypoglycemia   . Persistent atrial fibrillation ([place])       . Sensory ataxia   . Sleep apnea    CPAP    Patient Active Problem List   Diagnosis Date Noted  . Atrial extrasystoles 01/07/2016  . Sleep related hypoventilation/hypoxemia in other disease 01/07/2016  . OSA on CPAP 01/07/2016  . Persistent atrial fibrillation ([place]) 07/28/2015  . Acute on chronic diastolic heart failure ([place]) 05/05/2015  . Atrial flutter, unspecified   . Atrial flutter ([place]) 03/13/2015  . Encounter for therapeutic drug monitoring 12/25/2013  . 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 Tinnitus of both ears 09/25/2013  . Nystagmus, end-position 09/25/2013  . Neuropathy ([place]) 09/25/2013  . Hypersomnia with sleep apnea, unspecified 09/25/2013  . Tinnitus aurium 09/25/2013  . Long term current use of anticoagulant 02/01/2011  . HYPOGLYCEMIA 07/02/2009  . 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 Essential hypertension 07/02/2009  . Atrial fibrillation, persistent ([place]) 07/02/2009    Past  Surgical History:  Procedure Laterality Date  . CARDIOVERSION N/A 04/23/2015   Procedure: CARDIOVERSION;  Surgeon: [name], MD;  Location: [facility];  Service: Cardiovascular;  Laterality: N/A;  . CARDIOVERSION N/A 07/30/2015   Procedure: CARDIOVERSION;  Surgeon: [name], MD;  Location: [facility] ENDOSCOPY;  Service: Cardiovascular;  Laterality: N/A;  . LUMBAR [place] Medications    Prior to Admission medications   Medication Sig Start Date End Date Taking? Authorizing Provider  Calcium-Magnesium 500-250 MG TABS Take 1 tablet by mouth daily.     Yes Historical Provider, MD  Cholecalciferol 1000 UNIT capsule Take 2,000 Units by mouth 2 (two) times daily.    Yes Historical Provider, MD  Coenzyme Q10 (EQL COQ10) 100 MG capsule Take 200 mg by mouth daily.    Yes Historical Provider, MD  Cyanocobalamin (VITAMIN B 12 PO) Take 500 mcg by mouth 2 (two) times daily.    Yes Historical Provider, MD  dofetilide (TIKOSYN) 250 MCG capsule Take 1 capsule (250 mcg total) by mouth 2 (two) times daily. 07/29/16  Yes Minus Breeding, MD  escitalopram (LEXAPRO) 10 MG tablet Take 10 mg by mouth at bedtime.    Yes Historical Provider, MD  furosemide (LASIX) 20 MG tablet TAKE ONE TABLET BY MOUTH ONCE DAILY Patient taking differently: as needed 06/07/16  Yes Sherran Needs, NP  irbesartan (AVAPRO) 300 MG tablet Take 1 tablet (300 mg total) by mouth daily. Please contact  the office and schedule a follow up appointment for additional refills. 06/28/16  Yes Minus Breeding, MD  metoprolol succinate (TOPROL-XL) 25 MG 24 hr tablet TAKE ONE TABLET BY MOUTH ONCE DAILY 06/06/16  Yes Sherran Needs, NP  Multiple Vitamins-Minerals (CENTRUM SILVER PO) Take 1 tablet by mouth daily.     Yes Historical Provider, MD  warfarin (COUMADIN) 5 MG tablet Take 1 tablet (5 mg total) by mouth daily. Patient taking differently: Take 5 mg by mouth daily. Pt take 5 mg on Monday and Friday  Take 2.5 mg on Tuesday  Wednesday Thursday Saturday and Sunday 09/14/15  Yes Minus Breeding, MD  OXYGEN Inhale 2 L into the lungs at bedtime. Uses with CPAP machine    Historical Provider, MD    Family History Family History  Problem Relation Age of Onset  . Heart failure Father   . Hypertension Father   . Dementia Mother     Social History Social History  Substance Use Topics  . Smoking status: Former Smoker    Types: Cigarettes    Quit date: 12/13/1970  . Smokeless tobacco: Never Used  . Alcohol use No     Allergies   Carbapenems; Cephalosporins; Chlorpheniramine; Ciprofloxacin; Decongestant [oxymetazoline]; Diltiazem; Flagyl [metronidazole]; Hctz [hydrochlorothiazide]; Levaquin [levofloxacin in d5w]; Levofloxacin; Lidocaine; Penicillins; Teline [tetracycline]; Tetanus toxoid; Z-pak [azithromycin]; Metoprolol; Sulfonamide derivatives; and Toprol xl [metoprolol tartrate]   Review of Systems Review of Systems  All other systems reviewed and are negative.    Physical Exam Updated Vital Signs BP 154/72 (BP Location: Right Arm)   Pulse 79   Temp 97.4 F (36.3 C) (Oral)   Resp 18   Ht 5' 10.6" (1.793 m)   Wt 268 lb (121.6 kg)   SpO2 100%   BMI 37.80 kg/m   Physical Exam  Constitutional: He is oriented to person, place, and time. He appears well-developed and well-nourished.  HENT:  Head: Normocephalic and atraumatic.  Right Ear: External ear normal.  Left Ear: External ear normal.  Active nose bleeding right nares, treated with pressure initially  Eyes: Conjunctivae and EOM are normal. Pupils are equal, round, and reactive to light.  Neck: Normal range of motion and phonation normal. Neck supple.  Cardiovascular: Normal rate.   Pulmonary/Chest: Effort normal. He exhibits no bony tenderness.  Musculoskeletal: Normal range of motion.  Neurological: He is alert and oriented to person, place, and time. No cranial nerve deficit or sensory deficit. He exhibits normal muscle tone. Coordination  normal.  Skin: Skin is warm, dry and intact.  Psychiatric: He has a normal mood and affect. His behavior is normal. Judgment and thought content normal.  Nursing note and vitals reviewed.    ED Treatments / Results  Labs (all labs ordered are listed, but only abnormal results are displayed) Labs Reviewed  BASIC METABOLIC PANEL - Abnormal; Notable for the following:       Result Value   Glucose, Bld 103 (*)    GFR calc non Af Amer 54 (*)    All other components within normal limits  PROTIME-INR - Abnormal; Notable for the following:    Prothrombin Time 24.4 (*)    All other components within normal limits  CBC WITH DIFFERENTIAL/PLATELET    EKG  EKG Interpretation None       Radiology No results found.  Procedures Procedures (including critical care time)  Medications Ordered in ED Medications  0.9 %  sodium chloride infusion ( Intravenous New Bag/Given 08/25/16 1444)  Initial Impression / Assessment and Plan / ED Course  I have reviewed the triage vital signs and the nursing notes.  Pertinent labs & imaging results that were available during my care of the patient were reviewed by me and considered in my medical decision making (see chart for details).  Clinical Course   Medications  0.9 %  sodium chloride infusion ( Intravenous New Bag/Given 08/25/16 1444)    Patient Vitals for the past 24 hrs:  BP Temp Temp src Pulse Resp SpO2 Height Weight  08/25/16 1656 154/72 97.4 F (36.3 C) Oral 79 18 100 % - -  08/25/16 1345 176/76 98 F (36.7 C) Oral 76 20 97 % - -  08/25/16 1342 - - - - - - 5' 10.6" (1.793 m) 268 lb (121.6 kg)    5:06 PM Reevaluation with update and discussion. After initial assessment and treatment, an updated evaluation reveals Epistaxis has resolved. Findings discussed with patient and all questions answered. Samari Gorby L    Final Clinical Impressions(s) / ED Diagnoses   Final diagnoses:  Epistaxis    Nonspecific, nose bleeding,  resolved spontaneously. He is appropriately anticoagulated. No indication for altering his anticoagulation dosing, or treatment. Doubt sinusitis, or impending vascular collapse.  Nursing Notes Reviewed/ Care Coordinated Applicable Imaging Reviewed Interpretation of Laboratory Data incorporated into ED treatment  The patient appears reasonably screened and/or stabilized for discharge and I doubt any other medical condition or other [facility] requiring further screening, evaluation, or treatment in the ED at this time prior to discharge.  Plan: Home Medications- continue; Home Treatments- rest; return here if the recommended treatment, does not improve the symptoms; Recommended follow up- PCP prn   New Prescriptions New Prescriptions   No medications on file     Daleen Bo, MD 08/25/16 1708

## 2016-09-02 DIAGNOSIS — G4733 Obstructive sleep apnea (adult) (pediatric): Secondary | ICD-10-CM | POA: Diagnosis not present

## 2016-09-02 DIAGNOSIS — I4891 Unspecified atrial fibrillation: Secondary | ICD-10-CM | POA: Diagnosis not present

## 2016-09-05 ENCOUNTER — Ambulatory Visit (INDEPENDENT_AMBULATORY_CARE_PROVIDER_SITE_OTHER): Payer: Medicare Other | Admitting: *Deleted

## 2016-09-05 ENCOUNTER — Telehealth: Payer: Self-pay | Admitting: *Deleted

## 2016-09-05 DIAGNOSIS — I4819 Other persistent atrial fibrillation: Secondary | ICD-10-CM

## 2016-09-05 DIAGNOSIS — I481 Persistent atrial fibrillation: Secondary | ICD-10-CM

## 2016-09-05 DIAGNOSIS — Z5181 Encounter for therapeutic drug level monitoring: Secondary | ICD-10-CM

## 2016-09-05 NOTE — Telephone Encounter (Signed)
Patient was in ED on 08/25/16 w/ nose bleed. States that patient's INR was checked then. Cancelled appointment for today. Please call with instructions and new appointment. / tg

## 2016-09-05 NOTE — Telephone Encounter (Signed)
Done.  See coumadin note. 

## 2016-09-22 ENCOUNTER — Other Ambulatory Visit: Payer: Self-pay | Admitting: Cardiology

## 2016-09-22 MED ORDER — WARFARIN SODIUM 5 MG PO TABS: ORAL_TABLET | ORAL | 3 refills | Status: DC

## 2016-09-23 ENCOUNTER — Other Ambulatory Visit: Payer: Self-pay | Admitting: *Deleted

## 2016-09-23 MED ORDER — WARFARIN SODIUM 5 MG PO TABS: ORAL_TABLET | ORAL | 2 refills | Status: DC

## 2016-09-26 ENCOUNTER — Ambulatory Visit (INDEPENDENT_AMBULATORY_CARE_PROVIDER_SITE_OTHER): Payer: Medicare Other | Admitting: *Deleted

## 2016-09-26 DIAGNOSIS — Z5181 Encounter for therapeutic drug level monitoring: Secondary | ICD-10-CM | POA: Diagnosis not present

## 2016-09-26 DIAGNOSIS — I481 Persistent atrial fibrillation: Secondary | ICD-10-CM | POA: Diagnosis not present

## 2016-09-26 DIAGNOSIS — I4819 Other persistent atrial fibrillation: Secondary | ICD-10-CM

## 2016-09-26 LAB — POCT INR: INR: 2.1

## 2016-09-27 DIAGNOSIS — I10 Essential (primary) hypertension: Secondary | ICD-10-CM | POA: Diagnosis not present

## 2016-09-27 DIAGNOSIS — Z0001 Encounter for general adult medical examination with abnormal findings: Secondary | ICD-10-CM | POA: Diagnosis not present

## 2016-09-27 DIAGNOSIS — E1140 Type 2 diabetes mellitus with diabetic neuropathy, unspecified: Secondary | ICD-10-CM | POA: Diagnosis not present

## 2016-09-27 DIAGNOSIS — Z1389 Encounter for screening for other disorder: Secondary | ICD-10-CM | POA: Diagnosis not present

## 2016-09-27 DIAGNOSIS — Z23 Encounter for immunization: Secondary | ICD-10-CM | POA: Diagnosis not present

## 2016-09-27 DIAGNOSIS — Z6839 Body mass index (BMI) 39.0-39.9, adult: Secondary | ICD-10-CM | POA: Diagnosis not present

## 2016-10-03 DIAGNOSIS — I4891 Unspecified atrial fibrillation: Secondary | ICD-10-CM | POA: Diagnosis not present

## 2016-10-03 DIAGNOSIS — G4733 Obstructive sleep apnea (adult) (pediatric): Secondary | ICD-10-CM | POA: Diagnosis not present

## 2016-10-12 NOTE — Progress Notes (Signed)
HPI The patient presents for followup of atrial fibrillation. He has been taken off of his amiodarone because of neuropathy.  He has failed flecainide.  He is now on Tikosyn.    He did have some breath through episodes of atrial fib with RVR earlier this year and was treated with metoprolol in the A Fib Clinic.  Since I last saw him he was in the ED with epistaxis.  He reports he's been having episodes of lightheadedness. He described one recently where he felt somewhat diaphoretic and presyncopal when his pulse he thought was very weak. However, it resolved fairly quickly and he was unable to get any measurements. He's not had any frank syncope. He denies any shortness of breath, PND or orthopnea. He's not had any chest pressure, neck or arm discomfort. He has had some increased palpitations with about 3 episodes in the past week. Prior to that since starting the beta blocker he been improved. He says he might bring on his fibrillation if he exerts himself too much and becomes overly fatigued such as working in the yard for a long time or pedaling the stationary bicycle.   Allergies  Allergen Reactions  . Carbapenems   . Cephalosporins Other (See Comments)  . Chlorpheniramine Other (See Comments)    Swells prostate  . Ciprofloxacin     Pt can not take with amiodarone   . Decongestant [Oxymetazoline]     All decongestant - makes prostate swell  . Diltiazem Swelling    Left leg swelling  . Flagyl [Metronidazole] Nausea Only    Stomach ache  . Hctz [Hydrochlorothiazide]   . Levaquin [Levofloxacin In D5w] Other (See Comments)    unknown  . Levofloxacin Nausea Only  . Lidocaine     Pt unsure of  . Penicillins Swelling  . Teline [Tetracycline] Swelling    Hospitalization for 2 weeks, prostate swelling  . Tetanus Toxoid Swelling    Swells arms, Please pre-medicate with benadryl.  Marland Kitchen Z-Pak [Azithromycin] Other (See Comments)    Pt can not take with Amiodarone   . Metoprolol Itching and  Rash  . Sulfonamide Derivatives Rash  . Toprol Xl [Metoprolol Tartrate] Itching and Rash    Current Outpatient Prescriptions  Medication Sig Dispense Refill  . Calcium-Magnesium 500-250 MG TABS Take 1 tablet by mouth daily.      . Cholecalciferol 1000 UNIT capsule Take 2,000 Units by mouth 2 (two) times daily.     . Coenzyme Q10 (EQL COQ10) 100 MG capsule Take 200 mg by mouth daily.     . Cyanocobalamin (VITAMIN B 12 PO) Take 500 mcg by mouth 2 (two) times daily.     Marland Kitchen dofetilide (TIKOSYN) 250 MCG capsule Take 1 capsule (250 mcg total) by mouth 2 (two) times daily. 60 capsule 3  . escitalopram (LEXAPRO) 10 MG tablet Take 10 mg by mouth at bedtime.     . furosemide (LASIX) 20 MG tablet TAKE ONE TABLET BY MOUTH ONCE DAILY (Patient taking differently: as needed) 158 tablet 0  . irbesartan (AVAPRO) 300 MG tablet TAKE ONE TABLET BY MOUTH ONCE DAILY. PLEASE CONTACT THE OFFICE AND SCHEDULE A FOLLOW UP APPOINTMENT FOR ADDITIONAL REFILLS 30 tablet 0  . metoprolol succinate (TOPROL-XL) 25 MG 24 hr tablet TAKE ONE TABLET BY MOUTH ONCE DAILY 30 tablet 6  . Multiple Vitamins-Minerals (CENTRUM SILVER PO) Take 1 tablet by mouth daily.      . OXYGEN Inhale 2 L into the lungs at bedtime. Uses with  CPAP machine    . warfarin (COUMADIN) 5 MG tablet Take 1/2 or 1 tablet daily as directed by coumadin clinic 30 tablet 2   No current facility-administered medications for this visit.     Past Medical History:  Diagnosis Date  . Cancer ([place]) 2010   bladder  . Hypertension   . Hypoglycemia   . Persistent atrial fibrillation ([place])       . Sensory ataxia   . Sleep apnea    CPAP    Past Surgical History:  Procedure Laterality Date  . CARDIOVERSION N/A 04/23/2015   Procedure: CARDIOVERSION;  Surgeon: [name], MD;  Location: [facility];  Service: Cardiovascular;  Laterality: N/A;  . CARDIOVERSION N/A 07/30/2015   Procedure: CARDIOVERSION;  Surgeon: [name], MD;  Location: [facility] ENDOSCOPY;  Service:  Cardiovascular;  Laterality: N/A;  . LUMBAR [place]:     As stated in the HPI and negative for all other systems.  PHYSICAL EXAM BP 134/66   Pulse 62   Ht 5\' 8"  (1.727 m)   Wt 263 lb (119.3 kg)   BMI 39.99 kg/m  GENERAL:  Well appearing HEENT:  PERRL NECK:  No jugular venous distention, waveform within normal limits, carotid upstroke brisk and symmetric, no bruits, no thyromegaly LUNGS:  Clear to auscultation bilaterally HEART:  PMI not displaced or sustained,S1 and S2 within normal limits, no S3, no S4, no clicks, no rubs, no murmurs ABD:  Flat, positive bowel sounds normal in frequency in pitch, no bruits, no rebound, no guarding, no midline pulsatile mass, no hepatomegaly, no splenomegaly EXT:  2 plus pulses throughout, left greater than right lower extremity edema edema, no cyanosis no clubbing NEURO:  Nonfocal SKIN:  Dry rash  EKG:    Sinus brady, rate 62, axis within normal limits, QTC slightly prolonged, premature atrial contractions, lateral T-wave inversions unchanged from previous. 10/16/2016   ASSESSMENT AND PLAN  ATRIAL FIBRILLATION/FLUTTER:    Mr. EVERHETT SALSMAN has a CHA2DS2 - VASc score of 3 with a risk of stroke of 3.2%.  He is having some paroxysm but better pm the beta blocker.   I'm going to apply an event monitor to see if he's having any substantial bradycardia arrhythmias at which point we probably have to get rid of the beta blocker and further manage tachybradycardia syndrome.  [name] will need a 21 day event monitor.  The patients symptoms necessitate an event monitor.  The symptoms are too infrequent to be identified on a Holter monitor.  Of note he did not have orthostatic blood pressure readings in the office.  HTN:   The blood pressure is at target. No change in medications is indicated. We will continue with therapeutic lifestyle changes (TLC).  OVERWEIGHT:  The patient understands the need to lose weight with diet and exercise.   We talked about this again today.

## 2016-10-13 ENCOUNTER — Ambulatory Visit (INDEPENDENT_AMBULATORY_CARE_PROVIDER_SITE_OTHER): Payer: Medicare Other | Admitting: Cardiology

## 2016-10-13 ENCOUNTER — Encounter: Payer: Self-pay | Admitting: Cardiology

## 2016-10-13 ENCOUNTER — Telehealth: Payer: Self-pay | Admitting: Cardiology

## 2016-10-13 ENCOUNTER — Encounter (INDEPENDENT_AMBULATORY_CARE_PROVIDER_SITE_OTHER): Payer: Medicare Other

## 2016-10-13 VITALS — BP 134/66 | HR 62 | Ht 68.0 in | Wt 263.0 lb

## 2016-10-13 DIAGNOSIS — I480 Paroxysmal atrial fibrillation: Secondary | ICD-10-CM | POA: Diagnosis not present

## 2016-10-13 DIAGNOSIS — R55 Syncope and collapse: Secondary | ICD-10-CM

## 2016-10-13 NOTE — Telephone Encounter (Signed)
Chad [name] states that pt has Critical ECG on monitor and she states that she will send it over. Pt has h/o afib and has had 12 beats of v-tach

## 2016-10-13 NOTE — Telephone Encounter (Signed)
ECG reviewed and sign by Dr Percival Spanish

## 2016-10-13 NOTE — Patient Instructions (Signed)
Medication Instructions:  Continue current medications  Labwork: None Ordered  Testing/Procedures: Your physician has recommended that you wear an event monitor for 21 days. Event monitors are medical devices that record the heart's electrical activity. Doctors most often [place] these monitors to diagnose arrhythmias. Arrhythmias are problems with the speed or rhythm of the heartbeat. The monitor is a small, portable device. You can wear one while you do your normal daily activities. This is usually used to diagnose what is causing palpitations/syncope (passing out).  Follow-Up: Your physician recommends that you schedule a follow-up appointment in: 1 Months with Roderic Palau   Any Other Special Instructions Will Be Listed Below (If Applicable).     If you need a refill on your cardiac medications before your next appointment, please call your pharmacy.

## 2016-10-16 ENCOUNTER — Encounter: Payer: Self-pay | Admitting: Cardiology

## 2016-10-21 ENCOUNTER — Other Ambulatory Visit: Payer: Self-pay | Admitting: Cardiology

## 2016-10-21 NOTE — Telephone Encounter (Signed)
Rx(s) sent to pharmacy electronically.  

## 2016-10-26 ENCOUNTER — Ambulatory Visit (INDEPENDENT_AMBULATORY_CARE_PROVIDER_SITE_OTHER): Payer: Medicare Other | Admitting: *Deleted

## 2016-10-26 DIAGNOSIS — Z5181 Encounter for therapeutic drug level monitoring: Secondary | ICD-10-CM | POA: Diagnosis not present

## 2016-10-26 DIAGNOSIS — I481 Persistent atrial fibrillation: Secondary | ICD-10-CM

## 2016-10-26 DIAGNOSIS — I4819 Other persistent atrial fibrillation: Secondary | ICD-10-CM

## 2016-10-26 LAB — POCT INR: INR: 2

## 2016-10-27 ENCOUNTER — Encounter: Payer: Self-pay | Admitting: Cardiology

## 2016-10-31 DIAGNOSIS — H52223 Regular astigmatism, bilateral: Secondary | ICD-10-CM | POA: Diagnosis not present

## 2016-11-02 DIAGNOSIS — R55 Syncope and collapse: Secondary | ICD-10-CM | POA: Diagnosis not present

## 2016-11-02 DIAGNOSIS — I4891 Unspecified atrial fibrillation: Secondary | ICD-10-CM | POA: Diagnosis not present

## 2016-11-02 DIAGNOSIS — G4733 Obstructive sleep apnea (adult) (pediatric): Secondary | ICD-10-CM | POA: Diagnosis not present

## 2016-11-03 ENCOUNTER — Encounter (HOSPITAL_COMMUNITY): Payer: Self-pay | Admitting: Nurse Practitioner

## 2016-11-03 ENCOUNTER — Ambulatory Visit (HOSPITAL_COMMUNITY)
Admission: RE | Admit: 2016-11-03 | Discharge: 2016-11-03 | Disposition: A | Discharge status: Home / Self Care | Payer: Medicare Other | Source: Ambulatory Visit | Attending: Nurse Practitioner | Admitting: Nurse Practitioner

## 2016-11-03 VITALS — BP 156/94 | HR 69 | Ht 68.0 in | Wt 260.0 lb

## 2016-11-03 DIAGNOSIS — Z880 Allergy status to penicillin: Secondary | ICD-10-CM | POA: Diagnosis not present

## 2016-11-03 DIAGNOSIS — Z87891 Personal history of nicotine dependence: Secondary | ICD-10-CM | POA: Insufficient documentation

## 2016-11-03 DIAGNOSIS — I481 Persistent atrial fibrillation: Secondary | ICD-10-CM | POA: Insufficient documentation

## 2016-11-03 DIAGNOSIS — I480 Paroxysmal atrial fibrillation: Secondary | ICD-10-CM

## 2016-11-03 DIAGNOSIS — Z8551 Personal history of malignant neoplasm of bladder: Secondary | ICD-10-CM | POA: Diagnosis not present

## 2016-11-03 DIAGNOSIS — Z8249 Family history of ischemic heart disease and other diseases of the circulatory system: Secondary | ICD-10-CM | POA: Diagnosis not present

## 2016-11-03 DIAGNOSIS — Z79899 Other long term (current) drug therapy: Secondary | ICD-10-CM | POA: Diagnosis not present

## 2016-11-03 DIAGNOSIS — G4730 Sleep apnea, unspecified: Secondary | ICD-10-CM | POA: Insufficient documentation

## 2016-11-03 DIAGNOSIS — G629 Polyneuropathy, unspecified: Secondary | ICD-10-CM | POA: Insufficient documentation

## 2016-11-03 DIAGNOSIS — I10 Essential (primary) hypertension: Secondary | ICD-10-CM | POA: Insufficient documentation

## 2016-11-03 DIAGNOSIS — Z7901 Long term (current) use of anticoagulants: Secondary | ICD-10-CM | POA: Diagnosis not present

## 2016-11-03 DIAGNOSIS — Z881 Allergy status to other antibiotic agents status: Secondary | ICD-10-CM | POA: Diagnosis not present

## 2016-11-03 DIAGNOSIS — R55 Syncope and collapse: Secondary | ICD-10-CM | POA: Diagnosis not present

## 2016-11-03 DIAGNOSIS — Z888 Allergy status to other drugs, medicaments and biological substances status: Secondary | ICD-10-CM | POA: Diagnosis not present

## 2016-11-03 DIAGNOSIS — Z882 Allergy status to sulfonamides status: Secondary | ICD-10-CM | POA: Insufficient documentation

## 2016-11-03 LAB — BASIC METABOLIC PANEL
ANION GAP: 6 (ref 5–15)
BUN: 10 mg/dL (ref 6–20)
CALCIUM: 9.5 mg/dL (ref 8.9–10.3)
CO2: 29 mmol/L (ref 22–32)
Chloride: 104 mmol/L (ref 101–111)
Creatinine, Ser: 1.26 mg/dL — ABNORMAL HIGH (ref 0.61–1.24)
GFR, EST AFRICAN AMERICAN: 58 mL/min — AB (ref >60)
GFR, EST NON AFRICAN AMERICAN: 50 mL/min — AB (ref >60)
GLUCOSE: 101 mg/dL — AB (ref 65–99)
POTASSIUM: 3.9 mmol/L (ref 3.5–5.1)
Sodium: 139 mmol/L (ref 135–145)

## 2016-11-03 LAB — MAGNESIUM: MAGNESIUM: 2.2 mg/dL (ref 1.7–2.4)

## 2016-11-03 NOTE — Progress Notes (Signed)
Patient ID: Chad [name], male   DOB: January 18, 1929, 82 y.o.   MRN: [id number]     Primary Care Physician: Purvis Kilts, MD Referring Physician: [facility] f/u   Chad [name] is a 82 y.o. 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 He was  started on tikosyn 9/16 and was seen in the afib clinc for f/u in January. He was in SR, no complaints. He had prolonged qtc that day, reviewed with Dr. Caryl Comes and he felt QTC was ok. EKG repeated one week and qtc acceptable.Pt was not having any  afib that pt was aware of. He had been feeling well. He denies any new drugs or change in previous drugs. No change in general health.  He was seen in the afib clinic today, 3/16 due to complaints of having vision dim for a few seconds at a time. He feels liks he could pass out. No syncope. Started a couple of weeks ago. He can be sitting or standing. He is not aware of any sensation of heart racing or skipping with episodes. He was aware of heart racing, for short period of time last night but did not have any episodes of vision dimming with it. He has been instructed not to drive until the origin of these episodes can be further determined.30 day monitor was placed. Within a few days of wearing the monitor, an episode of elevated heart beat was noted, afib with RVR. Metoprolol was added at 25 mg a day and for the remainder of the monitor, there was no further elevated heart rate or episodes of feeling like he would pass out. No significant brady or pauses.   Returns 8/8 and feels well. No episodes of afib that he is aware of and no further episodes of RVR causing presyncopal rhythms, since starting back on metoprolol ER 25 mg a day. HR is office is 52 bpm, but he is not symptomatic with this. Has some issues with peripheral neuropathy but no falls. Continues on tikosyn bid and is compliant with warfarin.  F/u 12/21. He reports that he has had some  lightheaded spells but has not had any syncope. He just finished wearing an event monitor for these symptoms, placed by Dr.Hochrein, and pt will mail back today. Last time pt had these symptoms, monitor showed brief episodes of rapid afib and rate control was increased. He continues on Tikosyn with warfarin.  Today, he denies symptoms of palpitations, chest pain, shortness of breath, orthopnea, PND, lower extremity edema, dizziness, presyncope, syncope, or neurologic sequela. The patient is tolerating medications without difficulties and is otherwise without complaint today.   Past Medical History:  Diagnosis Date  . Cancer ([place]) 2010   bladder  . Hypertension   . Hypoglycemia   . Persistent atrial fibrillation ([place])       . Sensory ataxia   . Sleep apnea    CPAP   Past Surgical History:  Procedure Laterality Date  . CARDIOVERSION N/A 04/23/2015   Procedure: CARDIOVERSION;  Surgeon: [name], MD;  Location: [place];  Service: Cardiovascular;  Laterality: N/A;  . CARDIOVERSION N/A 07/30/2015   Procedure: CARDIOVERSION;  Surgeon: [name], MD;  Location: [place];  Service: Cardiovascular;  Laterality: N/A;  . LUMBAR [place]    Current Outpatient Prescriptions  Medication Sig Dispense Refill  . Calcium-Magnesium 500-250 MG TABS Take 1 tablet by mouth daily.      Chad [name]  Cholecalciferol 1000 UNIT capsule Take 2,000 Units by mouth 2 (two) times daily.     . Coenzyme Q10 (EQL COQ10) 100 MG capsule Take 200 mg by mouth daily.     . Cyanocobalamin (VITAMIN B 12 PO) Take 500 mcg by mouth 2 (two) times daily.     Chad [name] dofetilide (TIKOSYN) 250 MCG capsule Take 1 capsule (250 mcg total) by mouth 2 (two) times daily. 60 capsule 3  . escitalopram (LEXAPRO) 10 MG tablet Take 10 mg by mouth at bedtime.     . furosemide (LASIX) 20 MG tablet TAKE ONE TABLET BY MOUTH ONCE DAILY (Patient taking differently: take one tablet as needed) 158 tablet 0  . irbesartan (AVAPRO) 300 MG tablet  Take 1 tablet (300 mg total) by mouth daily. 30 tablet 9  . metoprolol succinate (TOPROL-XL) 25 MG 24 hr tablet TAKE ONE TABLET BY MOUTH ONCE DAILY 30 tablet 6  . Multiple Vitamins-Minerals (CENTRUM SILVER PO) Take 1 tablet by mouth daily.      . OXYGEN Inhale 2 L into the lungs at bedtime. Uses with CPAP machine    . warfarin (COUMADIN) 5 MG tablet Take 1/2 or 1 tablet daily as directed by coumadin clinic 30 tablet 2   No current facility-administered medications for this encounter.     Allergies  Allergen Reactions  . Carbapenems   . Cephalosporins Other (See Comments)  . Chlorpheniramine Other (See Comments)    Swells prostate  . Ciprofloxacin     Pt can not take with amiodarone   . Decongestant [Oxymetazoline]     All decongestant - makes prostate swell  . Diltiazem Swelling    Left leg swelling  . Flagyl [Metronidazole] Nausea Only    Stomach ache  . Hctz [Hydrochlorothiazide]   . Levaquin [Levofloxacin In D5w] Other (See Comments)    unknown  . Levofloxacin Nausea Only  . Lidocaine     Pt unsure of  . Penicillins Swelling  . Teline [Tetracycline] Swelling    Hospitalization for 2 weeks, prostate swelling  . Tetanus Toxoid Swelling    Swells arms, Please pre-medicate with benadryl.  Chad [name] Z-Pak [Azithromycin] Other (See Comments)    Pt can not take with Amiodarone   . Metoprolol Itching and Rash  . Sulfonamide Derivatives Rash  . Toprol Xl [Metoprolol Tartrate] Itching and Rash    Social History   Social History  . Marital status: Married    Spouse name: [name]  . Number of children: 2  . Years of education: 16   Occupational History  .  Retired   Social History Main Topics  . Smoking status: Former Smoker    Types: Cigarettes    Quit date: 12/13/1970  . Smokeless tobacco: Never Used  . Alcohol use No  . Drug use: No  . Sexual activity: Not on file   Other Topics Concern  . 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   Patient has two children.   Patient is retired.   Patient has a college education.   Patient is right-handed.   Patient does not drink any caffeine.    Family History  Problem Relation Age of Onset  . Heart failure Father   . Hypertension Father   . Dementia Mother     ROS- All systems are reviewed and negative except as per the HPI above  Physical Exam: Vitals:   11/03/16 1134  BP: Chad [name])  156/94  Pulse: 69  Weight: 260 lb (117.9 kg)  Height: 5\' 8"  (1.727 m)    GEN- The patient is well appearing, alert and oriented x 3 today.   Head- normocephalic, atraumatic Eyes-  Sclera clear, conjunctiva pink Ears- hearing intact Oropharynx- clear Neck- supple, no JVP Lymph- no cervical lymphadenopathy Lungs- Clear to ausculation bilaterally, normal work of breathing Heart- irregular rate and rhythm, no murmurs, rubs or gallops, PMI not laterally displaced GI- soft, NT, ND, + BS Extremities- no clubbing, cyanosis, or edema MS- no significant deformity or atrophy Skin- no rash or lesion Psych- euthymic mood, full affect Neuro- strength and sensation are intact  EKG- Sinus with multifocal PAC's, qtc 454 ms Epic records reviewed Previous 30 day monitor- interpreted by Dr. Percival Spanish Study Highlights    Intermittent atrial fib with tachy and brady episodes but no sustained pauses. Intermittent NSR with sinus bradycardia as well.     Assessment and Plan: 1. afib with previous presyncopal episodes Resolved with addition of metoprolol, thought to be secondary to fast heart rate Symptoms have reoccurred and another event  monitor is pending  Some afib on monitor but asymptomatic  No significant brady/pauses on monitor Continue tikosyn Bmet/cbc today  2. Htn  Stable  F/u Dr. Percival Spanish as scheduled Afib clinic as needed for surveillance of [place]. Kara Mierzejewski, [facility] [address] [place], [place]  [address] [phone]

## 2016-11-27 ENCOUNTER — Other Ambulatory Visit: Payer: Self-pay | Admitting: Cardiology

## 2016-11-28 ENCOUNTER — Ambulatory Visit (HOSPITAL_COMMUNITY)
Admission: RE | Admit: 2016-11-28 | Discharge: 2016-11-28 | Disposition: A | Discharge status: Home / Self Care | Payer: Medicare Other | Source: Ambulatory Visit | Attending: Nurse Practitioner | Admitting: Nurse Practitioner

## 2016-11-28 DIAGNOSIS — I480 Paroxysmal atrial fibrillation: Secondary | ICD-10-CM

## 2016-11-28 LAB — BASIC METABOLIC PANEL
Anion gap: 8 (ref 5–15)
BUN: 14 mg/dL (ref 6–20)
CHLORIDE: 103 mmol/L (ref 101–111)
CO2: 28 mmol/L (ref 22–32)
CREATININE: 1.25 mg/dL — AB (ref 0.61–1.24)
Calcium: 9.2 mg/dL (ref 8.9–10.3)
GFR calc non Af Amer: 51 mL/min — ABNORMAL LOW (ref >60)
GFR, EST AFRICAN AMERICAN: 59 mL/min — AB (ref >60)
Glucose, Bld: 127 mg/dL — ABNORMAL HIGH (ref 65–99)
POTASSIUM: 4.2 mmol/L (ref 3.5–5.1)
SODIUM: 139 mmol/L (ref 135–145)

## 2016-11-28 NOTE — Progress Notes (Addendum)
Chad Palau NP to review monitor results with patient.  I spoke to pt re results of recent monitor placed by Dr. Percival Spanish that showed tachbrady/ frequent PAF and runs of either aberrancy vrs vtach. Dr. Percival Spanish would like to have Chad [name] seen by EP and have requested an appointment.Pt does have woozy spells.

## 2016-11-28 NOTE — Telephone Encounter (Signed)
Rx(s) sent to pharmacy electronically.  

## 2016-12-02 ENCOUNTER — Ambulatory Visit (INDEPENDENT_AMBULATORY_CARE_PROVIDER_SITE_OTHER): Payer: Medicare Other | Admitting: *Deleted

## 2016-12-02 DIAGNOSIS — I481 Persistent atrial fibrillation: Secondary | ICD-10-CM

## 2016-12-02 DIAGNOSIS — Z5181 Encounter for therapeutic drug level monitoring: Secondary | ICD-10-CM

## 2016-12-02 DIAGNOSIS — I4819 Other persistent atrial fibrillation: Secondary | ICD-10-CM

## 2016-12-02 LAB — POCT INR: INR: 1.8

## 2016-12-03 DIAGNOSIS — I4891 Unspecified atrial fibrillation: Secondary | ICD-10-CM | POA: Diagnosis not present

## 2016-12-03 DIAGNOSIS — G4733 Obstructive sleep apnea (adult) (pediatric): Secondary | ICD-10-CM | POA: Diagnosis not present

## 2016-12-08 ENCOUNTER — Encounter (INDEPENDENT_AMBULATORY_CARE_PROVIDER_SITE_OTHER): Payer: Self-pay

## 2016-12-08 ENCOUNTER — Ambulatory Visit (INDEPENDENT_AMBULATORY_CARE_PROVIDER_SITE_OTHER): Payer: Medicare Other | Admitting: Internal Medicine

## 2016-12-08 ENCOUNTER — Encounter: Payer: Self-pay | Admitting: Internal Medicine

## 2016-12-08 VITALS — BP 150/80 | HR 72 | Ht 69.0 in | Wt 265.0 lb

## 2016-12-08 DIAGNOSIS — Z79899 Other long term (current) drug therapy: Secondary | ICD-10-CM

## 2016-12-08 DIAGNOSIS — I4819 Other persistent atrial fibrillation: Secondary | ICD-10-CM

## 2016-12-08 DIAGNOSIS — I481 Persistent atrial fibrillation: Secondary | ICD-10-CM | POA: Diagnosis not present

## 2016-12-08 NOTE — Progress Notes (Signed)
ELECTROPHYSIOLOGY CONSULT NOTE  Patient ID: Chad [name], MRN: [id number], DOB/AGE: July 19, 1929 83 y.o. Admit date: (Not on file) Date of Consult: 12/08/2016  Primary Physician: Purvis Kilts, MD Primary Cardiologist: [facility] Consulting Physician [facility]  Chief Complaint: wCT   HPI Chad [name] is a 83 y.o. male  Seen because of wide complex tachycardia in the context of therapy for paroxysmal/persistent atrial fibrillation   Event recorder >> atrial fib with variable rates 40-179 atrial fibrillation was evident this cduring awake hours  36% of the time  with heart rates reasonably controlled with some propensity towards bradycardia;  he also has sinus bradycardia and about 30% of his time is spent less than 60 bpm Also WCCT The first episode of which was most consistent with aberration; the second episode was most consistent with nonsustained ventricular tachycardia  He is largely unaware of his atrial fibrillation unless he goes very fast. His event recorder demonstrated 36% A. fib of which she was aware only minutes during the month. This is when his heart rate goes very fast and he has left arm pain. He also has exertional left arm pain.  He has treated sleep apnea but his car has not been read  Myoview 2004 no ischemia  Echocardiogram 2015 normal LV function moderate LAE (46/1.96)  Past Medical History:  Diagnosis Date  . Cancer ([place]) 2010   bladder  . Hypertension   . Hypoglycemia   . Persistent atrial fibrillation ([place])       . Sensory ataxia   . Sleep apnea    CPAP      Surgical History:  Past Surgical History:  Procedure Laterality Date  . CARDIOVERSION N/A 04/23/2015   Procedure: CARDIOVERSION;  Surgeon: [name], MD;  Location: [place];  Service: Cardiovascular;  Laterality: N/A;  . CARDIOVERSION N/A 07/30/2015   Procedure: CARDIOVERSION;  Surgeon: [name], MD;  Location: [facility];  Service: Cardiovascular;  Laterality: N/A;  . LUMBAR  DISC SURGERY  1991     Home Meds: Prior to Admission medications   Medication Sig Start Date End Date Taking? Authorizing Provider  Calcium-Magnesium 500-250 MG TABS Take 1 tablet by mouth daily.      Historical Provider, MD  Cholecalciferol 1000 UNIT capsule Take 2,000 Units by mouth 2 (two) times daily.     Historical Provider, MD  Coenzyme Q10 (EQL COQ10) 100 MG capsule Take 200 mg by mouth daily.     Historical Provider, MD  Cyanocobalamin (VITAMIN B 12 PO) Take 500 mcg by mouth 2 (two) times daily.     Historical Provider, MD  dofetilide (TIKOSYN) 250 MCG capsule TAKE ONE CAPSULE BY MOUTH TWICE DAILY 11/28/16   Minus Breeding, MD  escitalopram (LEXAPRO) 10 MG tablet Take 10 mg by mouth at bedtime.     Historical Provider, MD  furosemide (LASIX) 20 MG tablet TAKE ONE TABLET BY MOUTH ONCE DAILY Patient taking differently: take one tablet as needed 06/07/16   Sherran Needs, NP  irbesartan (AVAPRO) 300 MG tablet Take 1 tablet (300 mg total) by mouth daily. 10/21/16   Minus Breeding, MD  metoprolol succinate (TOPROL-XL) 25 MG 24 hr tablet TAKE ONE TABLET BY MOUTH ONCE DAILY 06/06/16   Sherran Needs, NP  Multiple Vitamins-Minerals (CENTRUM SILVER PO) Take 1 tablet by mouth daily.      Historical Provider, MD  OXYGEN Inhale 2 L into the lungs at bedtime. Uses with CPAP machine    Historical  Provider, MD  warfarin (COUMADIN) 5 MG tablet Take 1/2 or 1 tablet daily as directed by coumadin clinic 09/23/16   Minus Breeding, MD    Allergies:  Allergies  Allergen Reactions  . Carbapenems   . Cephalosporins Other (See Comments)  . Chlorpheniramine Other (See Comments)    Swells prostate  . Ciprofloxacin     Pt can not take with amiodarone   . Decongestant [Oxymetazoline]     All decongestant - makes prostate swell  . Diltiazem Swelling    Left leg swelling  . Flagyl [Metronidazole] Nausea Only    Stomach ache  . Hctz [Hydrochlorothiazide]   . Levaquin [Levofloxacin In D5w] Other (See  Comments)    unknown  . Levofloxacin Nausea Only  . Lidocaine     Pt unsure of  . Penicillins Swelling  . Teline [Tetracycline] Swelling    Hospitalization for 2 weeks, prostate swelling  . Tetanus Toxoid Swelling    Swells arms, Please pre-medicate with benadryl.  Marland Kitchen Z-Pak [Azithromycin] Other (See Comments)    Pt can not take with Amiodarone   . Metoprolol Itching and Rash  . Sulfonamide Derivatives Rash  . Toprol Xl [Metoprolol Tartrate] Itching and Rash    Social History   Social History  . Marital status: Married    Spouse name: [name]  . Number of children: 2  . Years of education: 16   Occupational History  .  Retired   Social History Main Topics  . Smoking status: Former Smoker    Types: Cigarettes    Quit date: 12/13/1970  . Smokeless tobacco: Never Used  . Alcohol use No  . Drug use: No  . Sexual activity: Not on file   Other Topics Concern  . 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   Patient has two children.   Patient is retired.   Patient has a college education.   Patient is right-handed.   Patient does not drink any caffeine.     Family History  Problem Relation Age of Onset  . Heart failure Father   . Hypertension Father   . Dementia Mother      ROS:  Please see the history of present illness.     All other systems reviewed and negative.    Physical Exam: Blood pressure (!) 150/80, pulse 72, height 5\' 9"  (1.753 m), weight 265 lb (120.2 kg), SpO2 96 %. General: Well developed Morbidly obese  male in no acute distress. Head: Normocephalic, atraumatic, sclera non-icteric, no xanthomas, nares are without discharge. EENT: normal  Lymph Nodes:  none Neck: Negative for carotid bruits. JVD not elevated. Back:without scoliosis kyphosis Lungs: Clear bilaterally to auscultation without wheezes, rales, or rhonchi. Breathing is unlabored. Heart: RRR with S1 S2.  2/6  murmur . No rubs, or  gallops appreciated. Abdomen: Soft, non-tender, non-distended with normoactive bowel sounds. No hepatomegaly. No rebound/guarding. No obvious abdominal masses. Msk:  Strength and tone appear normal for age. Extremities: No clubbing or cyanosis.  1+ edema.  Distal pedal pulses are 2+ and equal bilaterally. Skin: Warm and Dry Neuro: Alert and oriented X 3. CN III-XII intact Grossly normal sensory and motor function . Psych:  Responds to questions appropriately with a normal affect.      Labs: Cardiac Enzymes No results for input(s): CKTOTAL, CKMB, TROPONINI in the last 72 hours. CBC Lab Results  Component Value Date   WBC 5.4 08/25/2016  HGB 15.6 08/25/2016   HCT 46.8 08/25/2016   MCV 89.0 08/25/2016   PLT 161 08/25/2016   PROTIME: No results for input(s): LABPROT, INR in the last 72 hours. Chemistry No results for input(s): NA, K, CL, CO2, BUN, CREATININE, CALCIUM, PROT, BILITOT, ALKPHOS, ALT, AST, GLUCOSE in the last 168 hours.  Invalid input(s): LABALBU Lipids Lab Results  Component Value Date   CHOL 179 06/10/2010   HDL 46 06/10/2010   LDLCALC 118 06/10/2010   TRIG 75 06/10/2010   BNP No results found for: PROBNP Thyroid Function Tests: No results for input(s): TSH, T4TOTAL, T3FREE, THYROIDAB in the last 72 hours.  Invalid input(s): FREET3 Miscellaneous No results found for: DDIMER  Radiology/Studies:  No results found.  EKG: Sinus rhythm at 62 Intervals 21/09/45 Axis LVII   Assessment and Plan:  VT-NS  Atrial fib  RVR  Sinus bradycardia  Morbidly obese  OSA treated  Left arm pain --anginal equivalent    The patient has nonsustained ventricular tachycardia as well as aberration. It may represent toxicity from dofetilide; it may not. However, he is out of rhythm 36+ percent of the time; he is largely unaware of it less his heart rates go fast. I would suggest discontinuation of dofetilide.  His rates are both quite rapid sometimes and slow. I  think it is reasonable given his bradycardia episodes of dizziness that proceeding with pacing is reasonable; this will also allow treatment of his rapid atrial fibrillation.  Given his tachycardia associated arm pain which also occurs occasionally with exertion I think would be reasonable to consider Myoview scanning and/or empiric therapy. I will discuss the above with Dr. Percival Spanish when he returns. We will discuss it with the family when they return.  He is also encouraged to get his CPAP card reviewed  We discussed NOACs. His PCP is in favor of warfarin. If I get to know him better, I will encourage him to consider a NOAC  I have spoken with Dr [facility] about the above and we agree 1) stop dofetilide 2) Pacing for bradycardia 3) augmented rate control 4) myoview if CP persists   * Virl Axe

## 2016-12-08 NOTE — Patient Instructions (Signed)
Medication Instructions: - Your physician recommends that you continue on your current medications as directed. Please refer to the Current Medication list given to you today.  Labwork: - none ordered  Procedures/Testing: - none ordered  Follow-Up: - pending Dr. Olin Pia discussion with Dr. Percival Spanish- please call the office when you are back from your trip to discuss  Any Additional Special Instructions Will Be Listed Below (If Applicable).     If you need a refill on your cardiac medications before your next appointment, please call your pharmacy.

## 2016-12-28 ENCOUNTER — Ambulatory Visit (INDEPENDENT_AMBULATORY_CARE_PROVIDER_SITE_OTHER): Payer: Medicare Other | Admitting: *Deleted

## 2016-12-28 DIAGNOSIS — Z5181 Encounter for therapeutic drug level monitoring: Secondary | ICD-10-CM | POA: Diagnosis not present

## 2016-12-28 DIAGNOSIS — I4819 Other persistent atrial fibrillation: Secondary | ICD-10-CM

## 2016-12-28 DIAGNOSIS — I481 Persistent atrial fibrillation: Secondary | ICD-10-CM | POA: Diagnosis not present

## 2016-12-28 LAB — POCT INR: INR: 2.8

## 2017-01-03 DIAGNOSIS — G4733 Obstructive sleep apnea (adult) (pediatric): Secondary | ICD-10-CM | POA: Diagnosis not present

## 2017-01-03 DIAGNOSIS — I4891 Unspecified atrial fibrillation: Secondary | ICD-10-CM | POA: Diagnosis not present

## 2017-01-08 ENCOUNTER — Encounter: Payer: Self-pay | Admitting: Neurology

## 2017-01-09 ENCOUNTER — Ambulatory Visit (INDEPENDENT_AMBULATORY_CARE_PROVIDER_SITE_OTHER): Payer: Medicare Other | Admitting: Neurology

## 2017-01-09 ENCOUNTER — Encounter: Payer: Self-pay | Admitting: Neurology

## 2017-01-09 VITALS — BP 142/58 | HR 62 | Resp 18 | Ht 69.0 in | Wt 262.0 lb

## 2017-01-09 DIAGNOSIS — Z8551 Personal history of malignant neoplasm of bladder: Secondary | ICD-10-CM | POA: Diagnosis not present

## 2017-01-09 DIAGNOSIS — T50905A Adverse effect of unspecified drugs, medicaments and biological substances, initial encounter: Secondary | ICD-10-CM

## 2017-01-09 DIAGNOSIS — R001 Bradycardia, unspecified: Secondary | ICD-10-CM

## 2017-01-09 DIAGNOSIS — H9313 Tinnitus, bilateral: Secondary | ICD-10-CM | POA: Diagnosis not present

## 2017-01-09 DIAGNOSIS — Z9989 Dependence on other enabling machines and devices: Secondary | ICD-10-CM

## 2017-01-09 DIAGNOSIS — G4733 Obstructive sleep apnea (adult) (pediatric): Secondary | ICD-10-CM

## 2017-01-09 DIAGNOSIS — D074 Carcinoma in situ of penis: Secondary | ICD-10-CM | POA: Diagnosis not present

## 2017-01-09 DIAGNOSIS — I480 Paroxysmal atrial fibrillation: Secondary | ICD-10-CM | POA: Diagnosis not present

## 2017-01-09 NOTE — Progress Notes (Signed)
PATIENT: Chad [name] DOB: 02-12-1929  REASON FOR VISIT: follow up HISTORY FROM: patient  HISTORY OF PRESENT ILLNESS: Chad [name] is a 83 y.o. male Is seen here as a revisit from Dr. Percival Spanish and Hilma Favors , originally for Balance problems, now followed for OSA and CPAP compliance.  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 He also had a history of vitamin B12 deficiency which has been supplemented meanwhile. The patient underwent a sleep study on 10-22-2013 with a BMI of 38.5 and a neck circumference of 18 inches concerned of daytime excessive fatigue. The patient was diagnosed with an AHI of 63.8 and an RDI of 75.  His oxygen at nadir was 80% on 19.4 minutes. The patient was titrated to 7 cm water in the AHI was now 0.0 concerning was that he had for about 86 periodic limb movements and his arousal index was 6.2 based on the and in arousals. The oxygen nadir 86% but he still had prolonged desaturations. He was prescribed CPAP at 10 cm water with a Airfit P10 mask.  His cardiologist and primary care physician hava meanwhile weaned him  Off amiodarone since February - but he states that he feels more fatigued than before- yet on CPAP he sleeps better. His atrial fib remains controlled, but he was warned by Dr. Gerarda Fraction, that he may be back to the hospital if rate control fails.  His download from [facility] documented a download from his A[employer] PAP machine. the residual AHI of 2.4 the patient had an 88% compliance rate. He uses CPAP 5 hours and 41 minutes at night. CPAP is at a pressure of 10 cm water the download is dated 01-15-14 by Richardean Canal. Mask fitting issues were addressed with Chad [name]. We discuss today that we will check on overnight oxygen needs while on CPAP, and that he may nee to return to Amiodarone in the near future. His neuropathy will not improve much off Amiodarone.  Chad [name] reports that he  has developed gait instability and balance problems. He used the term ' I can't walk a straight line anymore" .  Reports having fallen 3 times in the last 6 months, he needs to with his gaze to his feet to walk steady. In addition he reports that remotely perhaps 20 years ago he suffered for 4 continuous years from vertical and felt constantly in motion but actually not moving. This episode however concluded that the initiation of niacin.  He had worked with radioisotopes, and The exposure was considered another possible cause of his Vertigo.  He does not consider his current episodes alike, and neither does he have syncope or presyncope.  He is ataxic and neuropathic. He takes B 12 supplement.  The patient suffers for long time from a-fibrillation, and ablation was never attempted he reports and according to Dr. Rosezella Florida note ,he has been able to maintain sinus rhythm.  Amiodarone causes neuropathies and he has been on this medication now for several years. Actually, decades. Thyroid hormones and liver enzymes are followed by his primary care physician, Dr. Hilma Favors.   Interval History 12-28-15; Status post cardioversion, neuropathy- feeling sticking and glove distribution. Chad [name] is now in NSR.  His CPAP download in office revealed a good compliance. 100% of days and 97% of hours, average is 9 hours of use, AHi was not obtained. He feels refreshed after using it.  FSS 33, Epworth 2, on 2 liters bled  ito CPAP. He may not be hypoxymic since he has been in NSR but he sure felt better after having it.   Interval history from 01/09/2017. I have the pleasure of seeing Chad [name] today. His cardiac arrhythmias especially his atrial fibrillation has been followed Dr. Percival Spanish and Dr. Virl Axe, His neuropathy has slightly progressed even after amiodarone was weaned off. He had no falls within the last 6 months, actually no falls in the last 12 months. This is encouraging. He wears supportive shoes.  100% compliance on his CPAP using CPAP at 10 cm water pressure was 1 cm EPR with a residual AHI of 6.2. His average user time at night is 7 hours and 30 minutes. None of the last 30 days to use the machine less than 4 hours. I would like to reset the machine to 12 cm water pressure with 3 cm EPR hopefully capturing more so residual apneas which I suspect will be obstructive in nature. I will send an order to [facility]. Can his machine be used as auto-titrator? 6-12 cm water ?   AHI noted on sleep study was 63.8.  Review of Systems:  Out of a complete 14 system review, the patient complains of only the following symptoms, and all other reviewed systems are negative.  Soreness over quadriceps and truncal core strength reduced. He has ataxia, and neuropathy.  High fall risk while on coumadin! Off amiodarone now.  Feels refreshed , significantly improved on CPAP.  FSS 39, down from 43 last visit, Epworth 4 points, same as last visit  ALLERGIES: PHYSICAL EXAM  Vitals:   01/09/17 1108  BP: (!) 142/58  Pulse: 62  Resp: 18  Weight: 262 lb (118.8 kg)  Height: 5\' 9"  (1.753 m)   Body mass index is 38.69 kg/m.  Generalized: Well developed, in no acute distress  Head: normocephalic and atraumatic. Oropharynx benign , Mallompati 1, Neck:  18.5  inches Supple, no carotid bruits  Cardiac: Regular rate rhythm, no murmur  Musculoskeletal: No deformity . Neurological examination  Mentation: Alert oriented to time, place, history taking. Follows all commands speech and language fluent Cranial nerve : his sense of taste and smell is intact.  Pupils are equal and briskly reactive to light.  Extraocular movements in vertical planes intact, and a strong horizontal nystagmus. Slightly more ptosis over the left eye. Visual fields by finger perimetry are intact.   Hearing impaired - remains the same as last year (to finger rub, bone and air conduction by tuning fork symmetrically impaired). He reads my lips as  we discuss his last years medical developments.   Facial sensation intact to fine touch. Facial motor strength is symmetric and tongue and uvula move midline.  Motor: symmetric motor tone is noted throughout.  Sensory: He has lost vibration and filament touch completely over both feet, and above the ankle line. Mid knee high.   ASSESSMENT AND PLAN  83 y.o. year old male here for a yearly RV with OSA on CPAP compliance and hypoxemia monitoring.  25 minutes.  His Tinnitus is chronic , hearing loss is affecting both ears and all modalities and frequencies. .   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   After ONO study, 2 L oxygen was added to his CPAP at night and he has been tolerating  well.    PLAN: Continue CPAP on current settings with 2 litres oxygen Followup visit in 12 months, CPAP machine can be downloaded per WiFi,  DME : [facility], [place]- he likes to get more supplies.  Continues with nasal pillows. I will request to change his machine to an AutoSet between 6 and 12 cm water pressure window with 3 cm EPR and sent in order to his DME. If the machine cannot support him autotitrator function, I would like to increase the final pressure setting to 12 cm water and increase the EPR to 3 cm from currently 1cm.    Chad Novacek, MD  01/09/2017, 11:35 AM [facility] [address], [place], [place] [address] [phone]

## 2017-01-09 NOTE — Patient Instructions (Signed)
I like Mr. Chad [name] to have a revisit with me within the next 3 months, especially if he should have received a pacemaker in the meantime. My goal is to reduce the residual AHI below 5 from currently 6.2. This is the first year his apnea index has been high. I am sure that his cardiac arrhythmia has something to do with it and I like for him to have an adjustment in CPAP pressure and sooner follow-up in 3 month, than yearly.

## 2017-01-18 DIAGNOSIS — L438 Other lichen planus: Secondary | ICD-10-CM | POA: Diagnosis not present

## 2017-01-18 DIAGNOSIS — L409 Psoriasis, unspecified: Secondary | ICD-10-CM | POA: Diagnosis not present

## 2017-01-25 ENCOUNTER — Ambulatory Visit (INDEPENDENT_AMBULATORY_CARE_PROVIDER_SITE_OTHER): Payer: Medicare Other | Admitting: *Deleted

## 2017-01-25 DIAGNOSIS — I4819 Other persistent atrial fibrillation: Secondary | ICD-10-CM

## 2017-01-25 DIAGNOSIS — Z5181 Encounter for therapeutic drug level monitoring: Secondary | ICD-10-CM | POA: Diagnosis not present

## 2017-01-25 DIAGNOSIS — I481 Persistent atrial fibrillation: Secondary | ICD-10-CM | POA: Diagnosis not present

## 2017-01-25 LAB — POCT INR: INR: 2.4

## 2017-01-30 ENCOUNTER — Other Ambulatory Visit: Payer: Self-pay | Admitting: Nurse Practitioner

## 2017-01-31 DIAGNOSIS — I4891 Unspecified atrial fibrillation: Secondary | ICD-10-CM | POA: Diagnosis not present

## 2017-01-31 DIAGNOSIS — G4733 Obstructive sleep apnea (adult) (pediatric): Secondary | ICD-10-CM | POA: Diagnosis not present

## 2017-02-02 ENCOUNTER — Encounter (HOSPITAL_COMMUNITY): Payer: Self-pay | Admitting: Nurse Practitioner

## 2017-02-02 ENCOUNTER — Ambulatory Visit (HOSPITAL_COMMUNITY)
Admission: RE | Admit: 2017-02-02 | Discharge: 2017-02-02 | Disposition: A | Discharge status: Home / Self Care | Payer: Medicare Other | Source: Ambulatory Visit | Attending: Nurse Practitioner | Admitting: Nurse Practitioner

## 2017-02-02 VITALS — BP 140/64 | HR 48 | Ht 69.0 in | Wt 268.0 lb

## 2017-02-02 DIAGNOSIS — Z888 Allergy status to other drugs, medicaments and biological substances status: Secondary | ICD-10-CM | POA: Diagnosis not present

## 2017-02-02 DIAGNOSIS — Z881 Allergy status to other antibiotic agents status: Secondary | ICD-10-CM | POA: Diagnosis not present

## 2017-02-02 DIAGNOSIS — Z7901 Long term (current) use of anticoagulants: Secondary | ICD-10-CM | POA: Insufficient documentation

## 2017-02-02 DIAGNOSIS — G4730 Sleep apnea, unspecified: Secondary | ICD-10-CM | POA: Insufficient documentation

## 2017-02-02 DIAGNOSIS — Z882 Allergy status to sulfonamides status: Secondary | ICD-10-CM | POA: Diagnosis not present

## 2017-02-02 DIAGNOSIS — I10 Essential (primary) hypertension: Secondary | ICD-10-CM | POA: Insufficient documentation

## 2017-02-02 DIAGNOSIS — Z880 Allergy status to penicillin: Secondary | ICD-10-CM | POA: Diagnosis not present

## 2017-02-02 DIAGNOSIS — I495 Sick sinus syndrome: Secondary | ICD-10-CM | POA: Diagnosis not present

## 2017-02-02 DIAGNOSIS — Z8551 Personal history of malignant neoplasm of bladder: Secondary | ICD-10-CM | POA: Insufficient documentation

## 2017-02-02 DIAGNOSIS — I4891 Unspecified atrial fibrillation: Secondary | ICD-10-CM | POA: Diagnosis present

## 2017-02-02 DIAGNOSIS — Z79899 Other long term (current) drug therapy: Secondary | ICD-10-CM | POA: Insufficient documentation

## 2017-02-02 DIAGNOSIS — I481 Persistent atrial fibrillation: Secondary | ICD-10-CM | POA: Insufficient documentation

## 2017-02-02 DIAGNOSIS — Z87891 Personal history of nicotine dependence: Secondary | ICD-10-CM | POA: Diagnosis not present

## 2017-02-02 DIAGNOSIS — Z8249 Family history of ischemic heart disease and other diseases of the circulatory system: Secondary | ICD-10-CM | POA: Insufficient documentation

## 2017-02-02 LAB — BASIC METABOLIC PANEL
Anion gap: 7 (ref 5–15)
BUN: 10 mg/dL (ref 6–20)
CO2: 29 mmol/L (ref 22–32)
CREATININE: 1.11 mg/dL (ref 0.61–1.24)
Calcium: 9.3 mg/dL (ref 8.9–10.3)
Chloride: 102 mmol/L (ref 101–111)
GFR calc Af Amer: >60 mL/min (ref >60)
GFR, EST NON AFRICAN AMERICAN: 58 mL/min — AB (ref >60)
GLUCOSE: 149 mg/dL — AB (ref 65–99)
Potassium: 4.5 mmol/L (ref 3.5–5.1)
SODIUM: 138 mmol/L (ref 135–145)

## 2017-02-02 LAB — MAGNESIUM: MAGNESIUM: 2.1 mg/dL (ref 1.7–2.4)

## 2017-02-02 MED ORDER — METOPROLOL SUCCINATE ER 25 MG PO TB24: 12.5000 mg | ORAL_TABLET | Freq: Every day | ORAL | 6 refills | Status: DC

## 2017-02-02 NOTE — Progress Notes (Signed)
Patient ID: SANTOSH PETTER, male   DOB: [birth year], 83 y.o.   MRN: [id number]     Primary Care Physician: Purvis Kilts, MD Referring Physician: [facility] f/u   DAOUDA LONZO is a 83 y.o. 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 He was  started on tikosyn 9/16 and was seen in the afib clinc for f/u in January. He was in SR, no complaints. He had prolonged qtc that day, reviewed with Dr. Caryl Comes and he felt QTC was ok. EKG repeated one week and qtc acceptable.Pt was not having any  afib that pt was aware of. He had been feeling well. He denies any new drugs or change in previous drugs. No change in general health.  He was seen in the afib clinic today, 3/16 due to complaints of having vision dim for a few seconds at a time. He feels liks he could pass out. No syncope. Started a couple of weeks ago. He can be sitting or standing. He is not aware of any sensation of heart racing or skipping with episodes. He was aware of heart racing, for short period of time last night but did not have any episodes of vision dimming with it. He has been instructed not to drive until the origin of these episodes can be further determined.30 day monitor was placed. Within a few days of wearing the monitor, an episode of elevated heart beat was noted, afib with RVR. Metoprolol was added at 25 mg a day and for the remainder of the monitor, there was no further elevated heart rate or episodes of feeling like he would pass out. No significant brady or pauses.   Returns 8/8 and feels well. No episodes of afib that he is aware of and no further episodes of RVR causing presyncopal rhythms, since starting back on metoprolol ER 25 mg a day. HR is office is 52 bpm, but he is not symptomatic with this. Has some issues with peripheral neuropathy but no falls. Continues on tikosyn bid and is compliant with warfarin.  F/u 12/21. He reports that he has had some  lightheaded spells but has not had any syncope. He just finished wearing an event monitor for these symptoms, placed by Dr.Hochrein, and pt will mail back today. Last time pt had these symptoms, monitor showed brief episodes of rapid afib and rate control was increased. He continues on Tikosyn with warfarin.  F/u 3/22. He has felt well. He had one episode of tachycardia but was associated with climbing 8 flights of stairs. No further dizziness spells. Last time he saw Dr. Caryl Comes, he talked to Dr. Percival Spanish re possibly stopping dofetilide and possibility of a PPM. Pt does f/u with Dr. Caryl Comes 4/9 at which time this will be further discussed. Pt was leaving for [facility] right after that appointment.He is slow today at 48 bpm.  Today, he denies symptoms of palpitations, chest pain, shortness of breath, orthopnea, PND, lower extremity edema, dizziness, presyncope, syncope, or neurologic sequela. The patient is tolerating medications without difficulties and is otherwise without complaint today.   Past Medical History:  Diagnosis Date  . Cancer ([place]) 2010   bladder  . Hypertension   . Hypoglycemia   . Persistent atrial fibrillation ([place])       . Sensory ataxia   . Sleep apnea    CPAP   Past Surgical History:  Procedure Laterality Date  . CARDIOVERSION N/A 04/23/2015  Procedure: CARDIOVERSION;  Surgeon: [name], MD;  Location: [facility];  Service: Cardiovascular;  Laterality: N/A;  . CARDIOVERSION N/A 07/30/2015   Procedure: CARDIOVERSION;  Surgeon: [name], MD;  Location: [facility];  Service: Cardiovascular;  Laterality: N/A;  . LUMBAR [place]    Current Outpatient Prescriptions  Medication Sig Dispense Refill  . Calcium-Magnesium 500-250 MG TABS Take 1 tablet by mouth daily.      . Cholecalciferol 1000 UNIT capsule Take 2,000 Units by mouth 2 (two) times daily.     . Coenzyme Q10 (EQL COQ10) 100 MG capsule Take 200 mg by mouth daily.     . Cyanocobalamin (VITAMIN B  12 PO) Take 500 mcg by mouth 2 (two) times daily.     Marland Kitchen dofetilide (TIKOSYN) 250 MCG capsule TAKE ONE CAPSULE BY MOUTH TWICE DAILY 180 capsule 2  . escitalopram (LEXAPRO) 10 MG tablet Take 10 mg by mouth at bedtime.     . furosemide (LASIX) 20 MG tablet Take 20 mg by mouth daily as needed for fluid or edema.    . irbesartan (AVAPRO) 300 MG tablet Take 1 tablet (300 mg total) by mouth daily. 30 tablet 9  . metoprolol succinate (TOPROL-XL) 25 MG 24 hr tablet TAKE ONE TABLET BY MOUTH ONCE DAILY 30 tablet 6  . Multiple Vitamins-Minerals (CENTRUM SILVER PO) Take 1 tablet by mouth daily.      . OXYGEN Inhale 2 L into the lungs at bedtime. Uses with CPAP machine    . warfarin (COUMADIN) 5 MG tablet Take 1/2 or 1 tablet daily as directed by coumadin clinic 30 tablet 2   No current facility-administered medications for this encounter.     Allergies  Allergen Reactions  . Carbapenems   . Cephalosporins Other (See Comments)  . Chlorpheniramine Other (See Comments)    Swells prostate  . Ciprofloxacin     Pt can not take with amiodarone   . Decongestant [Oxymetazoline]     All decongestant - makes prostate swell  . Diltiazem Swelling    Left leg swelling  . Flagyl [Metronidazole] Nausea Only    Stomach ache  . Hctz [Hydrochlorothiazide]   . Levaquin [Levofloxacin In D5w] Other (See Comments)    unknown  . Levofloxacin Nausea Only  . Lidocaine     Pt unsure of  . Penicillins Swelling  . Teline [Tetracycline] Swelling    Hospitalization for 2 weeks, prostate swelling  . Tetanus Toxoid Swelling    Swells arms, Please pre-medicate with benadryl.  Marland Kitchen Z-Pak [Azithromycin] Other (See Comments)    Pt can not take with Amiodarone   . Sulfonamide Derivatives Rash  . Toprol Xl [Metoprolol Tartrate] Itching and Rash    Social History   Social History  . Marital status: Married    Spouse name: [name]  . Number of children: 2  . Years of education: 16   Occupational History  .  Retired    Social History Main Topics  . Smoking status: Former Smoker    Types: Cigarettes    Quit date: 12/13/1970  . Smokeless tobacco: Never Used  . Alcohol use No  . Drug use: No  . Sexual activity: Not on file   Other Topics Concern  . 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   Patient has two children.   Patient is retired.   Patient has a college education.  Patient is right-handed.   Patient does not drink any caffeine.    Family History  Problem Relation Age of Onset  . Heart failure Father   . Hypertension Father   . Dementia Mother     ROS- All systems are reviewed and negative except as per the HPI above  Physical Exam: Vitals:   02/02/17 1053  BP: 140/64  Pulse: (!) 48  Weight: 268 lb (121.6 kg)  Height: 5\' 9"  (1.753 m)    GEN- The patient is well appearing, alert and oriented x 3 today.   Head- normocephalic, atraumatic Eyes-  Sclera clear, conjunctiva pink Ears- hearing intact Oropharynx- clear Neck- supple, no JVP Lymph- no cervical lymphadenopathy Lungs- Clear to ausculation bilaterally, normal work of breathing Heart- regular rate and rhythm, no murmurs, rubs or gallops, PMI not laterally displaced GI- soft, NT, ND, + BS Extremities- no clubbing, cyanosis, or edema MS- no significant deformity or atrophy Skin- no rash or lesion Psych- euthymic mood, full affect Neuro- strength and sensation are intact  EKG-  Appears to me to be a sinus brady with arrhythmia, v rate at 48 bpm, Qtc stable at 416 ms Epic records reviewed Previous 30 day monitor- interpreted by Dr. Percival Spanish Study Highlights    Intermittent atrial fib with tachy and brady episodes but no sustained pauses. Intermittent NSR with sinus bradycardia as well.     Assessment and Plan: 1. afib with tachy/brady syndrome  Pt currently asymptomatic Dizzy spells in the past Continue tikosyn for now, but per Dr. Caryl Comes, I  anticipate that he plans to stop this as well as PPM with h/o brady on f/u Decrease metoprolol to 1/2 tab daily for brady at 48 bpm Bmet/cbc today  2. Htn  Stable  F/u Dr.Klein 4/9 Afib clinic as needed   Butch Penny C. Eretria Manternach, [facility] [address] [place], [place] [address] [phone]

## 2017-02-08 ENCOUNTER — Encounter (HOSPITAL_COMMUNITY): Payer: Self-pay | Admitting: *Deleted

## 2017-02-08 ENCOUNTER — Emergency Department (HOSPITAL_COMMUNITY)
Admission: EM | Admit: 2017-02-08 | Discharge: 2017-02-08 | Disposition: A | Discharge status: Home / Self Care | Payer: Medicare Other | Attending: Emergency Medicine | Admitting: Emergency Medicine

## 2017-02-08 DIAGNOSIS — Z8551 Personal history of malignant neoplasm of bladder: Secondary | ICD-10-CM | POA: Insufficient documentation

## 2017-02-08 DIAGNOSIS — Z7901 Long term (current) use of anticoagulants: Secondary | ICD-10-CM | POA: Insufficient documentation

## 2017-02-08 DIAGNOSIS — I10 Essential (primary) hypertension: Secondary | ICD-10-CM | POA: Diagnosis not present

## 2017-02-08 DIAGNOSIS — Z79899 Other long term (current) drug therapy: Secondary | ICD-10-CM | POA: Diagnosis not present

## 2017-02-08 DIAGNOSIS — R040 Epistaxis: Secondary | ICD-10-CM

## 2017-02-08 DIAGNOSIS — Z87891 Personal history of nicotine dependence: Secondary | ICD-10-CM | POA: Insufficient documentation

## 2017-02-08 LAB — CBC WITH DIFFERENTIAL/PLATELET
Basophils Absolute: 0 10*3/uL (ref 0.0–0.1)
Basophils Relative: 0 %
Eosinophils Absolute: 0.3 10*3/uL (ref 0.0–0.7)
Eosinophils Relative: 3 %
HCT: 47.6 % (ref 39.0–52.0)
Hemoglobin: 16 g/dL (ref 13.0–17.0)
Lymphocytes Relative: 36 %
Lymphs Abs: 3 10*3/uL (ref 0.7–4.0)
MCH: 30.2 pg (ref 26.0–34.0)
MCHC: 33.6 g/dL (ref 30.0–36.0)
MCV: 90 fL (ref 78.0–100.0)
Monocytes Absolute: 0.7 10*3/uL (ref 0.1–1.0)
Monocytes Relative: 8 %
Neutro Abs: 4.3 10*3/uL (ref 1.7–7.7)
Neutrophils Relative %: 53 %
Platelets: 176 10*3/uL (ref 150–400)
RBC: 5.29 MIL/uL (ref 4.22–5.81)
RDW: 14.5 % (ref 11.5–15.5)
WBC: 8.3 10*3/uL (ref 4.0–10.5)

## 2017-02-08 LAB — PROTIME-INR
INR: 2.67
Prothrombin Time: 29 seconds — ABNORMAL HIGH (ref 11.4–15.2)

## 2017-02-08 MED ORDER — SILVER NITRATE-POT NITRATE 75-25 % EX MISC
2.0000 | CUTANEOUS | Status: DC | PRN
Start: 2017-02-08 — End: 2017-02-08
  Filled 2017-02-08: qty 2

## 2017-02-08 MED ORDER — COCAINE HCL 4 % EX SOLN
4.0000 mL | Freq: Once | CUTANEOUS | Status: AC
  Administered 2017-02-08: 4 mL via NASAL
  Filled 2017-02-08: qty 4

## 2017-02-08 NOTE — ED Notes (Signed)
cottonball with cocaine removed from nose by Dr. Wilson Singer.

## 2017-02-08 NOTE — ED Provider Notes (Signed)
[facility] DEPT Provider Note   CSN: [id number] Arrival date & time: 02/08/17  1147     History   Chief Complaint Chief Complaint  Patient presents with  . Epistaxis    HPI RAESHAUN SIMSON is a 83 y.o. male.  HPI   88ymale with epistaxis. Onset approximate 45 minutes prior to arrival. He has had intermittent nosebleeds over the years sometimes requiring emergency room visits. He denies any acute trauma recently. He does use nasal CPAP though. He is also anticoagulated on Coumadin. Interventions prior to arrival include local pressure. Feels like he has blood running down the neck was throat. No dizziness or lightheadedness. No nausea.  Past Medical History:  Diagnosis Date  . Cancer ([place]) 2010   bladder  . Hypertension   . Hypoglycemia   . Persistent atrial fibrillation ([place])       . Sensory ataxia   . Sleep apnea    CPAP    Patient Active Problem List   Diagnosis Date Noted  . Atrial extrasystoles 01/07/2016  . Sleep related hypoventilation/hypoxemia in other disease 01/07/2016  . OSA on CPAP 01/07/2016  . Persistent atrial fibrillation ([place]) 07/28/2015  . Acute on chronic diastolic heart failure ([place]) 05/05/2015  . Atrial flutter, unspecified   . Atrial flutter ([place]) 03/13/2015  . Encounter for therapeutic drug monitoring 12/25/2013  . 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 Tinnitus of both ears 09/25/2013  . Nystagmus, end-position 09/25/2013  . Neuropathy ([place]) 09/25/2013  . Hypersomnia with sleep apnea, unspecified 09/25/2013  . Tinnitus aurium 09/25/2013  . Long term current use of anticoagulant 02/01/2011  . HYPOGLYCEMIA 07/02/2009  . 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 Essential hypertension 07/02/2009  . Atrial fibrillation, persistent ([place]) 07/02/2009    Past Surgical History:  Procedure Laterality Date  . CARDIOVERSION N/A 04/23/2015   Procedure: CARDIOVERSION;  Surgeon: [name], MD;  Location: [facility];  Service: Cardiovascular;  Laterality:  N/A;  . CARDIOVERSION N/A 07/30/2015   Procedure: CARDIOVERSION;  Surgeon: [name], MD;  Location[facility];  Service: Cardiovascular;  Laterality: N/A;  . LUMBAR [place] Medications    Prior to Admission medications   Medication Sig Start Date End Date Taking? Authorizing Provider  Calcium-Magnesium 500-250 MG TABS Take 1 tablet by mouth daily.      Historical Provider, MD  Cholecalciferol 1000 UNIT capsule Take 2,000 Units by mouth 2 (two) times daily.     Historical Provider, MD  Coenzyme Q10 (EQL COQ10) 100 MG capsule Take 200 mg by mouth daily.     Historical Provider, MD  Cyanocobalamin (VITAMIN B 12 PO) Take 500 mcg by mouth 2 (two) times daily.     Historical Provider, MD  dofetilide (TIKOSYN) 250 MCG capsule TAKE ONE CAPSULE BY MOUTH TWICE DAILY 11/28/16   Minus Breeding, MD  escitalopram (LEXAPRO) 10 MG tablet Take 10 mg by mouth at bedtime.     Historical Provider, MD  furosemide (LASIX) 20 MG tablet Take 20 mg by mouth daily as needed for fluid or edema.    Historical Provider, MD  irbesartan (AVAPRO) 300 MG tablet Take 1 tablet (300 mg total) by mouth daily. 10/21/16   Minus Breeding, MD  metoprolol succinate (TOPROL-XL) 25 MG 24 hr tablet Take 0.5 tablets (12.5 mg total) by mouth daily. 02/02/17   Sherran Needs, NP  Multiple Vitamins-Minerals (CENTRUM SILVER PO) Take 1 tablet by mouth daily.      Historical Provider, MD  OXYGEN Inhale 2 L into the lungs at bedtime. Uses with CPAP machine    Historical Provider, MD  warfarin (COUMADIN) 5 MG tablet Take 1/2 or 1 tablet daily as directed by coumadin clinic 09/23/16   Minus Breeding, MD    Family History Family History  Problem Relation Age of Onset  . Heart failure Father   . Hypertension Father   . Dementia Mother     Social History Social History  Substance Use Topics  . Smoking status: Former Smoker    Types: Cigarettes    Quit date: 12/13/1970  . Smokeless tobacco: Never Used  .  Alcohol use No     Allergies   Carbapenems; Cephalosporins; Chlorpheniramine; Ciprofloxacin; Decongestant [oxymetazoline]; Diltiazem; Flagyl [metronidazole]; Hctz [hydrochlorothiazide]; Levaquin [levofloxacin in d5w]; Levofloxacin; Lidocaine; Penicillins; Teline [tetracycline]; Tetanus toxoid; Z-pak [azithromycin]; Sulfonamide derivatives; and Toprol xl [metoprolol tartrate]   Review of Systems Review of Systems  All systems reviewed and negative, other than as noted in HPI.  Physical Exam Updated Vital Signs BP (!) 163/99 (BP Location: Right Arm)   Pulse 85   Temp 98.4 F (36.9 C) (Oral)   Resp 18   Ht 5\' 9"  (1.753 m)   Wt 268 lb (121.6 kg)   SpO2 94%   BMI 39.58 kg/m   Physical Exam  Constitutional: He appears well-developed and well-nourished. No distress.  HENT:  Head: Normocephalic.  Pulsatile bleeding from area of R nasal septum. Blood noted in oropharynx.   Eyes: Conjunctivae are normal. Right eye exhibits no discharge. Left eye exhibits no discharge.  Neck: Neck supple.  Cardiovascular: Normal rate, regular rhythm and normal heart sounds.  Exam reveals no gallop and no friction rub.   No murmur heard. Pulmonary/Chest: Effort normal and breath sounds normal. No respiratory distress.  Abdominal: Soft. He exhibits no distension. There is no tenderness.  Musculoskeletal: He exhibits no edema or tenderness.  Neurological: He is alert.  Skin: Skin is warm and dry.  Psychiatric: He has a normal mood and affect. His behavior is normal. Thought content normal.  Nursing note and vitals reviewed.    ED Treatments / Results  Labs (all labs ordered are listed, but only abnormal results are displayed) Labs Reviewed  PROTIME-INR - Abnormal; Notable for the following:       Result Value   Prothrombin Time 29.0 (*)    All other components within normal limits  CBC WITH DIFFERENTIAL/PLATELET    EKG  EKG Interpretation None       Radiology No results  found.  Procedures Procedures (including critical care time)  Medications Ordered in ED Medications  silver nitrate applicators applicator 2 Stick (not administered)     Initial Impression / Assessment and Plan / ED Course  I have reviewed the triage vital signs and the nursing notes.  Pertinent labs & imaging results that were available during my care of the patient were reviewed by me and considered in my medical decision making (see chart for details).     88yM with anterior epistaxis. Listed allergies to lidocaine (more interested for epinephrine containing solution) which he says causes "swelling, rash and itching." He is very apprehensive to use afrin because of concern for prostate. 4% cocaine solution applied. Bleeding slowed. Sliver nitrate cautery. Bleeding stopped. Observed for ~15 minutes after. He uses nasal CPAP. Recommended smearing a small amount of petroleum jelly in nose before bed. ENT FU as needed. Return precautions discussed.   Final Clinical Impressions(s) / ED Diagnoses   Final  diagnoses:  Epistaxis  Anticoagulated on Coumadin    New Prescriptions New Prescriptions   No medications on file     Virgel Manifold, MD 02/10/17 2345

## 2017-02-08 NOTE — ED Notes (Signed)
Pt made aware to return if symptoms worsen or if any life threatening symptoms occur.   

## 2017-02-08 NOTE — ED Triage Notes (Signed)
Pt comes in for a nose bleed starting 30-45 minutes ago. Pt is coumadin for a-fib.

## 2017-02-20 ENCOUNTER — Ambulatory Visit (INDEPENDENT_AMBULATORY_CARE_PROVIDER_SITE_OTHER)
Admission: RE | Admit: 2017-02-20 | Discharge: 2017-02-20 | Disposition: A | Discharge status: Home / Self Care | Payer: Medicare Other | Source: Ambulatory Visit | Attending: Internal Medicine | Admitting: Internal Medicine

## 2017-02-20 ENCOUNTER — Ambulatory Visit (INDEPENDENT_AMBULATORY_CARE_PROVIDER_SITE_OTHER): Payer: Medicare Other | Admitting: Internal Medicine

## 2017-02-20 ENCOUNTER — Encounter: Payer: Self-pay | Admitting: Internal Medicine

## 2017-02-20 VITALS — BP 142/72 | HR 62 | Ht 69.0 in | Wt 267.0 lb

## 2017-02-20 DIAGNOSIS — Z79899 Other long term (current) drug therapy: Secondary | ICD-10-CM | POA: Diagnosis not present

## 2017-02-20 DIAGNOSIS — I472 Ventricular tachycardia, unspecified: Secondary | ICD-10-CM

## 2017-02-20 DIAGNOSIS — I480 Paroxysmal atrial fibrillation: Secondary | ICD-10-CM | POA: Diagnosis not present

## 2017-02-20 DIAGNOSIS — R001 Bradycardia, unspecified: Secondary | ICD-10-CM | POA: Diagnosis not present

## 2017-02-20 DIAGNOSIS — R519 Headache, unspecified: Secondary | ICD-10-CM

## 2017-02-20 DIAGNOSIS — R51 Headache: Secondary | ICD-10-CM

## 2017-02-20 NOTE — Progress Notes (Signed)
Patient Care Team: [name], MD as PCP - General (Family Medicine)   HPI  Chad [name] is a 83 y.o. male Seen in follow-up for atrial fibrillation nonsustained ventricular tachycardia in the context of sinus bradycardia and dofetilide therapy. When he was seen 1/18 recommendations admit to stop dofetilide and undertake pacing. I discussed this with Dr. Lenore Cordia.  He has had 4 episodes of epistaxis. He has been referred to Dr. Wilburn Cornelia  He also comes in complaining of a right frontal headache which is new.  Continues to struggle with exercise tolerance particularly with rapid heart rates.       Records and Results Reviewed labs from ER vsist  Past Medical History:  Diagnosis Date  . Cancer ([place]) 2010   bladder  . Hypertension   . Hypoglycemia   . Persistent atrial fibrillation ([place])       . Sensory ataxia   . Sleep apnea    CPAP    Past Surgical History:  Procedure Laterality Date  . CARDIOVERSION N/A 04/23/2015   Procedure: CARDIOVERSION;  Surgeon: [name], MD;  Location: [place];  Service: Cardiovascular;  Laterality: N/A;  . CARDIOVERSION N/A 07/30/2015   Procedure: CARDIOVERSION;  Surgeon: [name], MD;  Location: [place];  Service: Cardiovascular;  Laterality: N/A;  . LUMBAR [place]    Current Outpatient Prescriptions  Medication Sig Dispense Refill  . Calcium-Magnesium 500-250 MG TABS Take 1 tablet by mouth daily.      . Cholecalciferol 1000 UNIT capsule Take 2,000 Units by mouth 2 (two) times daily.     . Coenzyme Q10 (EQL COQ10) 100 MG capsule Take 200 mg by mouth daily.     . Cyanocobalamin (VITAMIN B 12 PO) Take 500 mcg by mouth 2 (two) times daily.     Marland Kitchen dofetilide (TIKOSYN) 250 MCG capsule TAKE ONE CAPSULE BY MOUTH TWICE DAILY 180 capsule 2  . escitalopram (LEXAPRO) 10 MG tablet Take 10 mg by mouth at bedtime.     . furosemide (LASIX) 20 MG tablet Take 20 mg by mouth daily as needed for fluid or edema.    .  irbesartan (AVAPRO) 300 MG tablet Take 1 tablet (300 mg total) by mouth daily. 30 tablet 9  . metoprolol succinate (TOPROL-XL) 25 MG 24 hr tablet Take 0.5 tablets (12.5 mg total) by mouth daily. 30 tablet 6  . Multiple Vitamins-Minerals (CENTRUM SILVER PO) Take 1 tablet by mouth daily.      . OXYGEN Inhale 2 L into the lungs at bedtime. Uses with CPAP machine    . warfarin (COUMADIN) 5 MG tablet Take 1/2 or 1 tablet daily as directed by coumadin clinic 30 tablet 2   No current facility-administered medications for this visit.     Allergies  Allergen Reactions  . Carbapenems   . Cephalosporins Other (See Comments)  . Chlorpheniramine Other (See Comments)    Swells prostate  . Ciprofloxacin     Pt can not take with amiodarone   . Decongestant [Oxymetazoline]     All decongestant - makes prostate swell  . Diltiazem Swelling    Left leg swelling  . Flagyl [Metronidazole] Nausea Only    Stomach ache  . Hctz [Hydrochlorothiazide]   . Levaquin [Levofloxacin In D5w] Other (See Comments)    unknown  . Levofloxacin Nausea Only  . Lidocaine     Pt unsure of  . Penicillins Swelling  . Teline [Tetracycline] Swelling  Hospitalization for 2 weeks, prostate swelling  . Tetanus Toxoid Swelling    Swells arms, Please pre-medicate with benadryl.  Marland Kitchen Z-Pak [Azithromycin] Other (See Comments)    Pt can not take with Amiodarone   . Sulfonamide Derivatives Rash  . Toprol Xl [Metoprolol Tartrate] Itching and Rash      Review of Systems negative except from HPI and PMH  Physical Exam BP (!) 142/72   Pulse 62   Ht 5\' 9"  (1.753 m)   Wt 267 lb (121.1 kg)   SpO2 97%   BMI 39.43 kg/m  Well developed and well nourished in no acute distress HENT normal E scleral and icterus clear Neck Supple JVP9-10 carotids brisk and full Clear to ausculation  Regular rate and rhythm, no murmurs gallops or rub Soft with active bowel sounds No clubbing cyanosis2+ Edema Alert and oriented, grossly normal  motor and sensory function Skin Warm and Dry    Assessment and  Plan  VT-NS  Atrial fib  RVR  Sinus bradycardia  Morbidly obese  OSA treated  HFpEF  Epistaxis  Headache-new  He continues to struggle with exercise intolerance related to rapid rates. We have again reviewed the physiology of pacing the benefits of anti-bradycardia pacing and the allow onset would offer for discontinuation of his dofetilide.   his Toprol had to be decreased again because of bradycardia. He has had exercise heart rates in the 140s.  He is at this time leaning towards backup bradycardia pacing. He will let us know following consultation with the surgeon about his broken tooth  He needs a head scan because of his new headache on his anticoagulation.  He is volume overloaded. We will increase his diuretic to 40 mg daily for 3 days.  More than 50% of 45 min was spent in counseling related to the above  Current medicines are reviewed at length with the patient today .  The patient does not  have concerns regarding medicines.

## 2017-02-20 NOTE — Patient Instructions (Signed)
Medication Instructions: - Your physician has recommended you make the following change in your medication:  1) Increase lasix (furosemide) 20 mg- take 2 tablets (40 mg) by mouth once daily x 3 days  Labwork: - none ordered  Procedures/Testing: - Your physician has recommended that you have a non- contrast head CT.  - Your physician has recommended that you have a pacemaker inserted. A pacemaker is a small device that is placed under the skin of your chest or abdomen to help control abnormal heart rhythms. This device uses electrical pulses to prompt the heart to beat at a normal rate. Pacemakers are used to treat heart rhythms that are too slow. Wire (leads) are attached to the pacemaker that goes into the chambers of you heart. This is done in the hospital and usually requires and overnight stay.   ** call Dr. Olin Pia nurse, Nira Conn, if you decide to proceed with having a pacemaker implanted - [phone]. **   Follow-Up: - pending  Any Additional Special Instructions Will Be Listed Below (If Applicable).     If you need a refill on your cardiac medications before your next appointment, please call your pharmacy.

## 2017-02-21 ENCOUNTER — Telehealth: Payer: Self-pay | Admitting: Internal Medicine

## 2017-02-21 NOTE — Telephone Encounter (Signed)
New Message    Pt is wanting results from scan yesterday please

## 2017-02-21 NOTE — Telephone Encounter (Signed)
The patient's wife is aware of the results of his head ct.

## 2017-03-01 ENCOUNTER — Ambulatory Visit (INDEPENDENT_AMBULATORY_CARE_PROVIDER_SITE_OTHER): Payer: Medicare Other | Admitting: *Deleted

## 2017-03-01 DIAGNOSIS — I4819 Other persistent atrial fibrillation: Secondary | ICD-10-CM

## 2017-03-01 DIAGNOSIS — Z5181 Encounter for therapeutic drug level monitoring: Secondary | ICD-10-CM

## 2017-03-01 DIAGNOSIS — I481 Persistent atrial fibrillation: Secondary | ICD-10-CM

## 2017-03-01 LAB — POCT INR: INR: 3.5

## 2017-03-02 ENCOUNTER — Telehealth: Payer: Self-pay | Admitting: *Deleted

## 2017-03-02 ENCOUNTER — Ambulatory Visit (INDEPENDENT_AMBULATORY_CARE_PROVIDER_SITE_OTHER): Payer: Medicare Other | Admitting: Pharmacist

## 2017-03-02 DIAGNOSIS — I4819 Other persistent atrial fibrillation: Secondary | ICD-10-CM

## 2017-03-02 DIAGNOSIS — I481 Persistent atrial fibrillation: Secondary | ICD-10-CM | POA: Diagnosis not present

## 2017-03-02 DIAGNOSIS — Z5181 Encounter for therapeutic drug level monitoring: Secondary | ICD-10-CM | POA: Diagnosis not present

## 2017-03-02 LAB — POCT INR: INR: 2.1

## 2017-03-02 NOTE — Telephone Encounter (Signed)
Call received from [facility] at Dr Caesar Bookman office this morning and she states he will need to have INR rechecked prior to dental extraction of 2 teeth today Leafy Ro states that if his INR is above 3.0 will need to recheck on Friday but to please call her at [phone] to give results of INR  and fax results of INR to [phone]   This nurse called and spoke with pt and made an appt for him to be seen in [place] office today at 11:45pm and he states understanding Please see coumadin encounter of yesterday

## 2017-03-08 ENCOUNTER — Ambulatory Visit (INDEPENDENT_AMBULATORY_CARE_PROVIDER_SITE_OTHER): Payer: Medicare Other | Admitting: *Deleted

## 2017-03-08 DIAGNOSIS — I481 Persistent atrial fibrillation: Secondary | ICD-10-CM

## 2017-03-08 DIAGNOSIS — Z5181 Encounter for therapeutic drug level monitoring: Secondary | ICD-10-CM | POA: Diagnosis not present

## 2017-03-08 DIAGNOSIS — I4819 Other persistent atrial fibrillation: Secondary | ICD-10-CM

## 2017-03-08 LAB — POCT INR: INR: 2

## 2017-03-16 DIAGNOSIS — Z7901 Long term (current) use of anticoagulants: Secondary | ICD-10-CM | POA: Diagnosis not present

## 2017-03-16 DIAGNOSIS — R040 Epistaxis: Secondary | ICD-10-CM | POA: Insufficient documentation

## 2017-03-22 ENCOUNTER — Other Ambulatory Visit: Payer: Self-pay | Admitting: Cardiology

## 2017-03-22 DIAGNOSIS — D049 Carcinoma in situ of skin, unspecified: Secondary | ICD-10-CM | POA: Diagnosis not present

## 2017-03-22 DIAGNOSIS — L439 Lichen planus, unspecified: Secondary | ICD-10-CM | POA: Diagnosis not present

## 2017-04-05 ENCOUNTER — Ambulatory Visit (INDEPENDENT_AMBULATORY_CARE_PROVIDER_SITE_OTHER): Payer: Medicare Other | Admitting: *Deleted

## 2017-04-05 DIAGNOSIS — Z5181 Encounter for therapeutic drug level monitoring: Secondary | ICD-10-CM | POA: Diagnosis not present

## 2017-04-05 DIAGNOSIS — I481 Persistent atrial fibrillation: Secondary | ICD-10-CM

## 2017-04-05 DIAGNOSIS — I4819 Other persistent atrial fibrillation: Secondary | ICD-10-CM

## 2017-04-05 LAB — POCT INR: INR: 3.6

## 2017-04-26 ENCOUNTER — Ambulatory Visit (INDEPENDENT_AMBULATORY_CARE_PROVIDER_SITE_OTHER): Payer: Medicare Other | Admitting: *Deleted

## 2017-04-26 DIAGNOSIS — I481 Persistent atrial fibrillation: Secondary | ICD-10-CM

## 2017-04-26 DIAGNOSIS — I4819 Other persistent atrial fibrillation: Secondary | ICD-10-CM

## 2017-04-26 DIAGNOSIS — Z5181 Encounter for therapeutic drug level monitoring: Secondary | ICD-10-CM | POA: Diagnosis not present

## 2017-04-26 LAB — POCT INR: INR: 2.8

## 2017-05-02 ENCOUNTER — Telehealth: Payer: Self-pay | Admitting: Internal Medicine

## 2017-05-02 NOTE — Telephone Encounter (Signed)
New message     Pt is ready to schedule getting device

## 2017-05-02 NOTE — Telephone Encounter (Signed)
I spoke with the patient and advised him that we will need to see him back for an updated H&P and then we can schedule his PPM implant.  He is agreeable with seeing Dr. Caryl Comes on 6/28.

## 2017-05-11 ENCOUNTER — Ambulatory Visit (INDEPENDENT_AMBULATORY_CARE_PROVIDER_SITE_OTHER): Payer: Medicare Other | Admitting: Internal Medicine

## 2017-05-11 ENCOUNTER — Encounter: Payer: Self-pay | Admitting: Internal Medicine

## 2017-05-11 VITALS — BP 138/80 | HR 73 | Ht 69.0 in | Wt 264.2 lb

## 2017-05-11 DIAGNOSIS — R001 Bradycardia, unspecified: Secondary | ICD-10-CM | POA: Diagnosis not present

## 2017-05-11 DIAGNOSIS — I472 Ventricular tachycardia, unspecified: Secondary | ICD-10-CM

## 2017-05-11 DIAGNOSIS — Z79899 Other long term (current) drug therapy: Secondary | ICD-10-CM | POA: Diagnosis not present

## 2017-05-11 DIAGNOSIS — I480 Paroxysmal atrial fibrillation: Secondary | ICD-10-CM | POA: Diagnosis not present

## 2017-05-11 NOTE — Progress Notes (Signed)
Patient Care Team: [name], MD as PCP - General (Family Medicine)   HPI  Chad [name] is a 83 y.o. male Seen in follow-up for atrial fibrillation nonsustained ventricular tachycardia in the context of sinus bradycardia and dofetilide therapy. When he was seen 1/18 recommendations admit to stop dofetilide and undertake pacing. I discussed this with Dr. [facility]  He has had 4 episodes of epistaxis.No cause found   Continues to struggle with exercise tolerance particularly with rapid heart rates.  He notes >>130s  This is a frequent issue, a couple times a month  SYmtposm of SOB DOE as he came in today.  No CP and no Palps  Records and Results Reviewed labs from ER vsist  Past Medical History:  Diagnosis Date  . 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 Hypertension   . Hypoglycemia   . Persistent atrial fibrillation ([place])       . Sensory ataxia   . Sleep apnea    CPAP    Past Surgical History:  Procedure Laterality Date  . CARDIOVERSION N/A 04/23/2015   Procedure: CARDIOVERSION;  Surgeon: [name], MD;  Location: [place];  Service: Cardiovascular;  Laterality: N/A;  . CARDIOVERSION N/A 07/30/2015   Procedure: CARDIOVERSION;  Surgeon: [name], MD;  Location: [place];  Service: Cardiovascular;  Laterality: N/A;  . LUMBAR [place]    Current Outpatient Prescriptions  Medication Sig Dispense Refill  . Calcium-Magnesium 500-250 MG TABS Take 1 tablet by mouth daily.      . Cholecalciferol 1000 UNIT capsule Take 2,000 Units by mouth 2 (two) times daily.     . Coenzyme Q10 (EQL COQ10) 100 MG capsule Take 200 mg by mouth daily.     . Cyanocobalamin (VITAMIN B 12 PO) Take 500 mcg by mouth 2 (two) times daily.     Marland Kitchen dofetilide (TIKOSYN) 250 MCG capsule TAKE ONE CAPSULE BY MOUTH TWICE DAILY 180 capsule 2  . escitalopram (LEXAPRO) 10 MG tablet Take 10 mg by mouth at bedtime.     . furosemide (LASIX) 20 MG tablet Take 20 mg by mouth daily as needed for  fluid or edema.    . irbesartan (AVAPRO) 300 MG tablet Take 1 tablet (300 mg total) by mouth daily. 30 tablet 9  . metoprolol succinate (TOPROL-XL) 25 MG 24 hr tablet Take 0.5 tablets (12.5 mg total) by mouth daily. 30 tablet 6  . Multiple Vitamins-Minerals (CENTRUM SILVER PO) Take 1 tablet by mouth daily.      . OXYGEN Inhale 2 L into the lungs at bedtime. Uses with CPAP machine    . warfarin (COUMADIN) 5 MG tablet TAKE ONE-HALF TO ONE TABLET BY MOUTH ONCE DAILY AS DIRECTED BY COUMADIN CLINIC 90 tablet 1   No current facility-administered medications for this visit.     Allergies  Allergen Reactions  . Carbapenems   . Cephalosporins Other (See Comments)  . Chlorpheniramine Other (See Comments)    Swells prostate  . Ciprofloxacin     Pt can not take with amiodarone   . Decongestant [Oxymetazoline]     All decongestant - makes prostate swell  . Diltiazem Swelling    Left leg swelling  . Flagyl [Metronidazole] Nausea Only    Stomach ache  . Hctz [Hydrochlorothiazide]   . Levaquin [Levofloxacin In D5w] Other (See Comments)    unknown  . Levofloxacin Nausea Only  . Lidocaine     Pt unsure of  .  Penicillins Swelling  . Teline [Tetracycline] Swelling    Hospitalization for 2 weeks, prostate swelling  . Tetanus Toxoid Swelling    Swells arms, Please pre-medicate with benadryl.  Marland Kitchen Z-Pak [Azithromycin] Other (See Comments)    Pt can not take with Amiodarone   . Sulfonamide Derivatives Rash  . Toprol Xl [Metoprolol Tartrate] Itching and Rash      Review of Systems negative except from HPI and PMH  Physical Exam BP 138/80   Pulse 73   Ht 5\' 9"  (1.753 m)   Wt 264 lb 4 oz (119.9 kg)   SpO2 95%   BMI 39.02 kg/m  Well developed and nourished in no acute distress HENT normal Neck supple with JVP-flat Carotids brisk and full without bruits Clear Regular rate and rhythm, no murmurs or gallops Abd-soft with active BS without hepatomegaly No Clubbing cyanosis 1+  edema Skin-warm and dry A & Oriented  Grossly normal sensory and motor function  ECG afib at 78  Assessment and  Plan  VT-NS  Atrial fib  VVR  Sinus bradycardia  Morbidly obese  OSA treated  HFpEF   We have discussed the role of pacing for his tachybrady syndrome to allow rate control   His symptoms today are concerning as they occur in the setting of ( presumed -ECG at rest) controlled ventricular response  Euvolemic continue current meds  The benefits and risks were reviewed including but not limited to death,  perforation, infection, lead dislodgement and device malfunction.  The patient understands agrees and is willing to proceed.       Current medicines are reviewed at length with the patient today .  The patient does not  have concerns regarding medicines.

## 2017-05-11 NOTE — Patient Instructions (Signed)
Medication Instructions:    Your physician recommends that you continue on your current medications as directed. Please refer to the Current Medication list given to you today.  - If you need a refill on your cardiac medications before your next appointment, please call your pharmacy.   Labwork:  None ordered  Testing/Procedures: .Your physician has recommended that you have a pacemaker inserted. A pacemaker is a small device that is placed under the skin of your chest or abdomen to help control abnormal heart rhythms. This device uses electrical pulses to prompt the heart to beat at a normal rate. Pacemakers are used to treat heart rhythms that are too slow. Wire (leads) are attached to the pacemaker that goes into the chambers of you heart. This is done in the hospital and usually requires and overnight stay.   Office will call you to arrange this procedure for August  Surgical scrub given to you today, along w/ scrub instructions  Follow-Up:  To be determined once procedure is scheduled.  Thank you for choosing [facility]!!     Any Other Special Instructions Will Be Listed Below (If Applicable).   Pacemaker Implantation, Adult Pacemaker implantation is a procedure to place a pacemaker inside your chest. A pacemaker is a small computer that sends electrical signals to the heart and helps your heart beat normally. A pacemaker also stores information about your heart rhythms. You may need pacemaker implantation if you:  Have a slow heartbeat (bradycardia).  Faint (syncope).  Have shortness of breath (dyspnea) due to heart problems.  The pacemaker attaches to your heart through a wire, called a lead. Sometimes just one lead is needed. Other times, there will be two leads. There are two types of pacemakers:  Transvenous pacemaker. This type is placed under the skin or muscle of your chest. The lead goes through a vein in the chest area to reach the inside of the  heart.  Epicardial pacemaker. This type is placed under the skin or muscle of your chest or belly. The lead goes through your chest to the outside of the heart.  Tell a health care provider about:  Any allergies you have.  All medicines you are taking, including vitamins, herbs, eye drops, creams, and over-the-counter medicines.  Any problems you or family members have had with anesthetic medicines.  Any blood or bone disorders you have.  Any surgeries you have had.  Any medical conditions you have.  Whether you are pregnant or may be pregnant. What are the risks? Generally, this is a safe procedure. However, problems may occur, including:  Infection.  Bleeding.  Failure of the pacemaker or the lead.  Collapse of a lung or bleeding into a lung.  Blood clot inside a blood vessel with a lead.  Damage to the heart.  Infection inside the heart (endocarditis).  Allergic reactions to medicines.  What happens before the procedure? Staying hydrated Follow instructions from your health care provider about hydration, which may include:  Up to 2 hours before the procedure - you may continue to drink clear liquids, such as water, clear fruit juice, black coffee, and plain tea.  Eating and drinking restrictions Follow instructions from your health care provider about eating and drinking, which may include:  8 hours before the procedure - stop eating heavy meals or foods such as meat, fried foods, or fatty foods.  6 hours before the procedure - stop eating light meals or foods, such as toast or cereal.  6 hours before  the procedure - stop drinking milk or drinks that contain milk.  2 hours before the procedure - stop drinking clear liquids.  Medicines  Ask your health care provider about: ? Changing or stopping your regular medicines. This is especially important if you are taking diabetes medicines or blood thinners. ? Taking medicines such as aspirin and ibuprofen.  These medicines can thin your blood. Do not take these medicines before your procedure if your health care provider instructs you not to.  You may be given antibiotic medicine to help prevent infection. General instructions  You will have a heart evaluation. This may include an electrocardiogram (ECG), chest X-ray, and heart imaging (echocardiogram,  or echo) tests.  You will have blood tests.  Do not use any products that contain nicotine or tobacco, such as cigarettes and e-cigarettes. If you need help quitting, ask your health care provider.  Plan to have someone take you home from the hospital or clinic.  If you will be going home right after the procedure, plan to have someone with you for 24 hours.  Ask your health care provider how your surgical site will be marked or identified. What happens during the procedure?  To reduce your risk of infection: ? Your health care team will wash or sanitize their hands. ? Your skin will be washed with soap. ? Hair may be removed from the surgical area.  An IV tube will be inserted into one of your veins.  You will be given one or more of the following: ? A medicine to help you relax (sedative). ? A medicine to numb the area (local anesthetic). ? A medicine to make you fall asleep (general anesthetic).  If you are getting a transvenous pacemaker: ? An incision will be made in your upper chest. ? A pocket will be made for the pacemaker. It may be placed under the skin or between layers of muscle. ? The lead will be inserted into a blood vessel that returns to the heart. ? While X-rays are taken by an imaging machine (fluoroscopy), the lead will be advanced through the vein to the inside of your heart. ? The other end of the lead will be tunneled under the skin and attached to the pacemaker.  If you are getting an epicardial pacemaker: ? An incision will be made near your ribs or breastbone (sternum) for the lead. ? The lead will be  attached to the outside of your heart. ? Another incision will be made in your chest or upper belly to create a pocket for the pacemaker. ? The free end of the lead will be tunneled under the skin and attached to the pacemaker.  The transvenous or epicardial pacemaker will be tested. Imaging studies may be done to check the lead position.  The incisions will be closed with stitches (sutures), adhesive strips, or skin glue.  Bandages (dressing) will be placed over the incisions. The procedure may vary among health care providers and hospitals. What happens after the procedure?  Your blood pressure, heart rate, breathing rate, and blood oxygen level will be monitored until the medicines you were given have worn off.  You will be given antibiotics and pain medicine.  ECG and chest x-rays will be done.  You will wear a continuous type of ECG (Holter monitor) to check your heart rhythm.  Your health care provider willprogram the pacemaker.  Do not drive for 24 hours if you received a sedative. This information is not intended to replace advice  given to you by your health care provider. Make sure you discuss any questions you have with your health care provider. Document Released: 10/21/2002 Document Revised: 05/20/2016 Document Reviewed: 04/13/2016 Elsevier Interactive Patient Education  [employer].

## 2017-05-24 ENCOUNTER — Telehealth: Payer: Self-pay | Admitting: Internal Medicine

## 2017-05-24 ENCOUNTER — Ambulatory Visit (INDEPENDENT_AMBULATORY_CARE_PROVIDER_SITE_OTHER): Payer: Medicare Other | Admitting: *Deleted

## 2017-05-24 DIAGNOSIS — Z5181 Encounter for therapeutic drug level monitoring: Secondary | ICD-10-CM | POA: Diagnosis not present

## 2017-05-24 DIAGNOSIS — I481 Persistent atrial fibrillation: Secondary | ICD-10-CM

## 2017-05-24 DIAGNOSIS — I4819 Other persistent atrial fibrillation: Secondary | ICD-10-CM

## 2017-05-24 LAB — POCT INR: INR: 2.9

## 2017-05-24 NOTE — Telephone Encounter (Signed)
Attempted to call the patient to give some dates for his PPM implant to be done.  I spoke with Mrs. Carlis Abbott (DPR) and gave her August dates as they are going out of town and working around her schedule for chemo. She will look at that dates for August and call me back with a decision.

## 2017-05-25 NOTE — Telephone Encounter (Signed)
Patient wife returning call  °

## 2017-05-25 NOTE — Telephone Encounter (Signed)
Returned call to wife. They have decided on August 8th for procedure. I will route to Alvis Lemmings, RN to follow up.

## 2017-06-01 ENCOUNTER — Encounter: Payer: Self-pay | Admitting: *Deleted

## 2017-06-08 ENCOUNTER — Other Ambulatory Visit: Payer: Self-pay | Admitting: *Deleted

## 2017-06-08 DIAGNOSIS — Z01812 Encounter for preprocedural laboratory examination: Secondary | ICD-10-CM

## 2017-06-08 DIAGNOSIS — I4821 Permanent atrial fibrillation: Secondary | ICD-10-CM

## 2017-06-08 DIAGNOSIS — I495 Sick sinus syndrome: Secondary | ICD-10-CM

## 2017-06-12 ENCOUNTER — Other Ambulatory Visit: Payer: Self-pay | Admitting: Internal Medicine

## 2017-06-12 ENCOUNTER — Telehealth: Payer: Self-pay | Admitting: Internal Medicine

## 2017-06-12 DIAGNOSIS — I482 Chronic atrial fibrillation: Secondary | ICD-10-CM | POA: Diagnosis not present

## 2017-06-12 DIAGNOSIS — Z01812 Encounter for preprocedural laboratory examination: Secondary | ICD-10-CM | POA: Diagnosis not present

## 2017-06-12 DIAGNOSIS — I495 Sick sinus syndrome: Secondary | ICD-10-CM | POA: Diagnosis not present

## 2017-06-12 NOTE — Telephone Encounter (Signed)
Chad [name] requests we fax orders to [phone]

## 2017-06-12 NOTE — Telephone Encounter (Signed)
Follow Up:    Wife said pt is supposed to have lab work today for his procedure.She wants to know where he needs to go for it please?

## 2017-06-12 NOTE — Telephone Encounter (Signed)
Spoke with patient's wife and advised that per the instruction letter from Dr. Olin Pia nurse, Alvis Lemmings, RN, patient is to go to the [place] lab across from the hospital in [place]. Wife states they have been out of town and have not picked up their mail yet. She verbalized understanding and thanked me for the call.

## 2017-06-12 NOTE — Telephone Encounter (Signed)
Chad [name] with [facility] calling, states that patient is in lab to have blood work completed but she does not have an order for this patient.

## 2017-06-12 NOTE — Telephone Encounter (Signed)
Orders faxed to [phone] and confirmation received

## 2017-06-13 LAB — BASIC METABOLIC PANEL
BUN: 14 mg/dL (ref 7–25)
CO2: 24 mmol/L (ref 20–31)
Calcium: 9.2 mg/dL (ref 8.6–10.3)
Chloride: 102 mmol/L (ref 98–110)
Creat: 1.16 mg/dL — ABNORMAL HIGH (ref 0.70–1.11)
GLUCOSE: 76 mg/dL (ref 65–99)
POTASSIUM: 4.5 mmol/L (ref 3.5–5.3)
SODIUM: 139 mmol/L (ref 135–146)

## 2017-06-13 LAB — CBC WITH DIFFERENTIAL/PLATELET
BASOS PCT: 0 %
Basophils Absolute: 0 cells/uL (ref 0–200)
Eosinophils Absolute: 308 cells/uL (ref 15–500)
Eosinophils Relative: 4 %
HEMATOCRIT: 46.3 % (ref 38.5–50.0)
HEMOGLOBIN: 15.2 g/dL (ref 13.2–17.1)
LYMPHS ABS: 2464 {cells}/uL (ref 850–3900)
Lymphocytes Relative: 32 %
MCH: 28.6 pg (ref 27.0–33.0)
MCHC: 32.8 g/dL (ref 32.0–36.0)
MCV: 87.2 fL (ref 80.0–100.0)
MONO ABS: 770 {cells}/uL (ref 200–950)
MPV: 9.4 fL (ref 7.5–12.5)
Monocytes Relative: 10 %
NEUTROS ABS: 4158 {cells}/uL (ref 1500–7800)
Neutrophils Relative %: 54 %
Platelets: 216 10*3/uL (ref 140–400)
RBC: 5.31 MIL/uL (ref 4.20–5.80)
RDW: 14.8 % (ref 11.0–15.0)
WBC: 7.7 10*3/uL (ref 3.8–10.8)

## 2017-06-13 LAB — PROTIME-INR
INR: 2.2 — AB
Prothrombin Time: 23.4 s — ABNORMAL HIGH (ref 9.0–11.5)

## 2017-06-14 ENCOUNTER — Ambulatory Visit (INDEPENDENT_AMBULATORY_CARE_PROVIDER_SITE_OTHER): Payer: Medicare Other | Admitting: *Deleted

## 2017-06-14 DIAGNOSIS — Z5181 Encounter for therapeutic drug level monitoring: Secondary | ICD-10-CM

## 2017-06-14 DIAGNOSIS — I481 Persistent atrial fibrillation: Secondary | ICD-10-CM

## 2017-06-14 DIAGNOSIS — I4819 Other persistent atrial fibrillation: Secondary | ICD-10-CM

## 2017-06-15 ENCOUNTER — Telehealth: Payer: Self-pay | Admitting: Internal Medicine

## 2017-06-15 NOTE — Telephone Encounter (Signed)
Patient pending a PPM implant on 06/21/17 with Dr. Caryl Comes. Labs received. INR 2.2 on 7/30- per Dr. Caryl Comes, the patient should hold his dose of warfarin on 06/19/17, take it on 06/20/17, hold the morning of his procedure.  I left a message on his home # to call. I left a message on his identified cell # of Dr. Olin Pia recommendations, but to please call the office back to verify her received these instructions.  Will forward to triage to please follow up with the patient tomorrow afternoon, if he does not call back sooner.

## 2017-06-16 NOTE — Telephone Encounter (Signed)
Call placed to Pt daughter per DPR on file.  Per daughter she will be arriving from [facility] on Tuesday to be with Pt for procedure.  Notified daughter of warfarin schedule per Dr. Caryl Comes.   Daughter indicates understanding.  Daughter requested further information regarding medications to take day of procedure.  Provided education per review of nurse instruction letter.  Daughter indicates understanding.  Gave this nurse name and # if any further questions.  Daughter states appreciates the call.

## 2017-06-21 ENCOUNTER — Encounter (HOSPITAL_COMMUNITY)
Admission: RE | Disposition: A | Discharge status: Home / Self Care | Payer: Self-pay | Source: Ambulatory Visit | Attending: Internal Medicine

## 2017-06-21 ENCOUNTER — Encounter (HOSPITAL_COMMUNITY): Payer: Self-pay | Admitting: General Practice

## 2017-06-21 ENCOUNTER — Ambulatory Visit (HOSPITAL_COMMUNITY)
Admission: RE | Admit: 2017-06-21 | Discharge: 2017-06-22 | Disposition: A | Discharge status: Home / Self Care | Payer: Medicare Other | Source: Ambulatory Visit | Attending: Internal Medicine | Admitting: Internal Medicine

## 2017-06-21 DIAGNOSIS — I110 Hypertensive heart disease with heart failure: Secondary | ICD-10-CM | POA: Diagnosis not present

## 2017-06-21 DIAGNOSIS — Z7901 Long term (current) use of anticoagulants: Secondary | ICD-10-CM | POA: Insufficient documentation

## 2017-06-21 DIAGNOSIS — G4730 Sleep apnea, unspecified: Secondary | ICD-10-CM | POA: Diagnosis not present

## 2017-06-21 DIAGNOSIS — Z9989 Dependence on other enabling machines and devices: Secondary | ICD-10-CM | POA: Diagnosis not present

## 2017-06-21 DIAGNOSIS — I495 Sick sinus syndrome: Secondary | ICD-10-CM | POA: Diagnosis not present

## 2017-06-21 DIAGNOSIS — Z6839 Body mass index (BMI) 39.0-39.9, adult: Secondary | ICD-10-CM | POA: Insufficient documentation

## 2017-06-21 DIAGNOSIS — Z884 Allergy status to anesthetic agent status: Secondary | ICD-10-CM | POA: Diagnosis not present

## 2017-06-21 DIAGNOSIS — I481 Persistent atrial fibrillation: Secondary | ICD-10-CM | POA: Insufficient documentation

## 2017-06-21 DIAGNOSIS — Z882 Allergy status to sulfonamides status: Secondary | ICD-10-CM | POA: Diagnosis not present

## 2017-06-21 DIAGNOSIS — Z880 Allergy status to penicillin: Secondary | ICD-10-CM | POA: Diagnosis not present

## 2017-06-21 DIAGNOSIS — I5030 Unspecified diastolic (congestive) heart failure: Secondary | ICD-10-CM | POA: Diagnosis not present

## 2017-06-21 DIAGNOSIS — I472 Ventricular tachycardia: Secondary | ICD-10-CM | POA: Diagnosis not present

## 2017-06-21 DIAGNOSIS — Z791 Long term (current) use of non-steroidal anti-inflammatories (NSAID): Secondary | ICD-10-CM | POA: Diagnosis not present

## 2017-06-21 DIAGNOSIS — Z8551 Personal history of malignant neoplasm of bladder: Secondary | ICD-10-CM | POA: Diagnosis not present

## 2017-06-21 DIAGNOSIS — R001 Bradycardia, unspecified: Secondary | ICD-10-CM

## 2017-06-21 DIAGNOSIS — I480 Paroxysmal atrial fibrillation: Secondary | ICD-10-CM | POA: Diagnosis not present

## 2017-06-21 DIAGNOSIS — I5032 Chronic diastolic (congestive) heart failure: Secondary | ICD-10-CM | POA: Insufficient documentation

## 2017-06-21 DIAGNOSIS — Z887 Allergy status to serum and vaccine status: Secondary | ICD-10-CM | POA: Diagnosis not present

## 2017-06-21 DIAGNOSIS — Z959 Presence of cardiac and vascular implant and graft, unspecified: Secondary | ICD-10-CM

## 2017-06-21 DIAGNOSIS — G4733 Obstructive sleep apnea (adult) (pediatric): Secondary | ICD-10-CM | POA: Insufficient documentation

## 2017-06-21 DIAGNOSIS — Z79899 Other long term (current) drug therapy: Secondary | ICD-10-CM | POA: Diagnosis not present

## 2017-06-21 DIAGNOSIS — E162 Hypoglycemia, unspecified: Secondary | ICD-10-CM | POA: Insufficient documentation

## 2017-06-21 DIAGNOSIS — Z881 Allergy status to other antibiotic agents status: Secondary | ICD-10-CM | POA: Diagnosis not present

## 2017-06-21 HISTORY — DX: Malignant neoplasm of bladder, unspecified: C67.9

## 2017-06-21 HISTORY — DX: Pneumonia, unspecified organism: J18.9

## 2017-06-21 HISTORY — DX: Obstructive sleep apnea (adult) (pediatric): G47.33

## 2017-06-21 HISTORY — DX: Dependence on other enabling machines and devices: Z99.89

## 2017-06-21 HISTORY — PX: INSERT / REPLACE / REMOVE PACEMAKER: SUR710

## 2017-06-21 HISTORY — PX: PACEMAKER IMPLANT: EP1218

## 2017-06-21 HISTORY — DX: Unspecified chronic bronchitis: J42

## 2017-06-21 HISTORY — DX: Dependence on supplemental oxygen: Z99.81

## 2017-06-21 HISTORY — DX: Presence of cardiac pacemaker: Z95.0

## 2017-06-21 LAB — SURGICAL PCR SCREEN
MRSA, PCR: NEGATIVE
Staphylococcus aureus: NEGATIVE

## 2017-06-21 LAB — PROTIME-INR
INR: 2.01
PROTHROMBIN TIME: 23.1 s — AB (ref 11.4–15.2)

## 2017-06-21 LAB — MAGNESIUM: MAGNESIUM: 2.2 mg/dL (ref 1.7–2.4)

## 2017-06-21 SURGERY — PACEMAKER IMPLANT

## 2017-06-21 MED ORDER — CHLORHEXIDINE GLUCONATE 4 % EX LIQD: 60.0000 mL | Freq: Once | CUTANEOUS | Status: DC

## 2017-06-21 MED ORDER — SODIUM CHLORIDE 0.9 % IV SOLN: INTRAVENOUS | Status: AC

## 2017-06-21 MED ORDER — MIDAZOLAM HCL 5 MG/5ML IJ SOLN
INTRAMUSCULAR | Status: AC
  Filled 2017-06-21: qty 5

## 2017-06-21 MED ORDER — FENTANYL CITRATE (PF) 100 MCG/2ML IJ SOLN
INTRAMUSCULAR | Status: AC
  Filled 2017-06-21: qty 2

## 2017-06-21 MED ORDER — MUPIROCIN 2 % EX OINT
1.0000 "application " | TOPICAL_OINTMENT | Freq: Once | CUTANEOUS | Status: AC
  Administered 2017-06-21: 1 via TOPICAL

## 2017-06-21 MED ORDER — CALCIUM-MAGNESIUM 500-250 MG PO TABS: 1.0000 | ORAL_TABLET | Freq: Every day | ORAL | Status: DC

## 2017-06-21 MED ORDER — BUPIVACAINE HCL (PF) 0.25 % IJ SOLN
INTRAMUSCULAR | Status: DC | PRN
  Administered 2017-06-21: 35 mL

## 2017-06-21 MED ORDER — BUPIVACAINE HCL (PF) 0.5 % IJ SOLN
20.0000 mL | Freq: Once | INTRAMUSCULAR | Status: DC
  Filled 2017-06-21 (×2): qty 20

## 2017-06-21 MED ORDER — FUROSEMIDE 20 MG PO TABS: 20.0000 mg | ORAL_TABLET | Freq: Every day | ORAL | Status: DC | PRN

## 2017-06-21 MED ORDER — MAGNESIUM OXIDE 400 (241.3 MG) MG PO TABS
200.0000 mg | ORAL_TABLET | Freq: Every day | ORAL | Status: DC
  Administered 2017-06-21 – 2017-06-22 (×2): 200 mg via ORAL
  Filled 2017-06-21 (×2): qty 1

## 2017-06-21 MED ORDER — HEPARIN (PORCINE) IN NACL 2-0.9 UNIT/ML-% IJ SOLN
INTRAMUSCULAR | Status: AC | PRN
  Administered 2017-06-21: 500 mL

## 2017-06-21 MED ORDER — DOFETILIDE 250 MCG PO CAPS
250.0000 ug | ORAL_CAPSULE | Freq: Two times a day (BID) | ORAL | Status: DC
  Administered 2017-06-21 – 2017-06-22 (×2): 250 ug via ORAL
  Filled 2017-06-21 (×2): qty 1

## 2017-06-21 MED ORDER — VANCOMYCIN HCL 10 G IV SOLR
1500.0000 mg | INTRAVENOUS | Status: AC
  Administered 2017-06-21: 1500 mg via INTRAVENOUS
  Filled 2017-06-21: qty 1500

## 2017-06-21 MED ORDER — MIDAZOLAM HCL 5 MG/5ML IJ SOLN
INTRAMUSCULAR | Status: DC | PRN
  Administered 2017-06-21 (×2): 1 mg via INTRAVENOUS

## 2017-06-21 MED ORDER — ONDANSETRON HCL 4 MG/2ML IJ SOLN: 4.0000 mg | Freq: Four times a day (QID) | INTRAMUSCULAR | Status: DC | PRN

## 2017-06-21 MED ORDER — VITAMIN D 1000 UNITS PO TABS
2000.0000 [IU] | ORAL_TABLET | Freq: Two times a day (BID) | ORAL | Status: DC
  Administered 2017-06-22: 2000 [IU] via ORAL
  Filled 2017-06-21: qty 2

## 2017-06-21 MED ORDER — IOPAMIDOL (ISOVUE-370) INJECTION 76%
INTRAVENOUS | Status: AC
  Filled 2017-06-21: qty 50

## 2017-06-21 MED ORDER — FENTANYL CITRATE (PF) 100 MCG/2ML IJ SOLN
INTRAMUSCULAR | Status: DC | PRN
Start: 2017-06-21 — End: 2017-06-21
  Administered 2017-06-21 (×2): 12.5 ug via INTRAVENOUS

## 2017-06-21 MED ORDER — CYANOCOBALAMIN 500 MCG PO TABS
500.0000 ug | ORAL_TABLET | Freq: Two times a day (BID) | ORAL | Status: DC
  Administered 2017-06-22: 500 ug via ORAL
  Filled 2017-06-21: qty 1

## 2017-06-21 MED ORDER — VANCOMYCIN HCL IN DEXTROSE 1-5 GM/200ML-% IV SOLN
1000.0000 mg | Freq: Two times a day (BID) | INTRAVENOUS | Status: AC
  Administered 2017-06-21: 23:00:00 1000 mg via INTRAVENOUS
  Filled 2017-06-21: qty 200

## 2017-06-21 MED ORDER — COENZYME Q10 100 MG PO CAPS: 200.0000 mg | ORAL_CAPSULE | Freq: Every day | ORAL | Status: DC

## 2017-06-21 MED ORDER — ACETAMINOPHEN 325 MG PO TABS: 325.0000 mg | ORAL_TABLET | ORAL | Status: DC | PRN

## 2017-06-21 MED ORDER — SODIUM CHLORIDE 0.9 % IR SOLN
80.0000 mg | Status: AC
  Administered 2017-06-21: 80 mg

## 2017-06-21 MED ORDER — YOU HAVE A PACEMAKER BOOK
Freq: Once | Status: AC
  Administered 2017-06-21: 22:00:00
  Filled 2017-06-21: qty 1

## 2017-06-21 MED ORDER — GENTAMICIN SULFATE 40 MG/ML IJ SOLN
INTRAMUSCULAR | Status: AC
  Filled 2017-06-21: qty 2

## 2017-06-21 MED ORDER — SODIUM CHLORIDE 0.9 % IV SOLN
INTRAVENOUS | Status: DC
  Administered 2017-06-21: 09:00:00 via INTRAVENOUS

## 2017-06-21 MED ORDER — IOPAMIDOL (ISOVUE-370) INJECTION 76%
INTRAVENOUS | Status: DC | PRN
Start: 2017-06-21 — End: 2017-06-21
  Administered 2017-06-21: 20 mL via INTRAVENOUS

## 2017-06-21 MED ORDER — VANCOMYCIN HCL IN DEXTROSE 1-5 GM/200ML-% IV SOLN
INTRAVENOUS | Status: AC
  Filled 2017-06-21: qty 200

## 2017-06-21 MED ORDER — CALCIUM CARBONATE ANTACID 1250 MG/5ML PO SUSP
500.0000 mg | Freq: Every day | ORAL | Status: DC
  Administered 2017-06-22: 500 mg via ORAL
  Filled 2017-06-21: qty 5

## 2017-06-21 MED ORDER — ESCITALOPRAM OXALATE 10 MG PO TABS
10.0000 mg | ORAL_TABLET | Freq: Every day | ORAL | Status: DC
  Administered 2017-06-21: 22:00:00 10 mg via ORAL
  Filled 2017-06-21: qty 1

## 2017-06-21 MED ORDER — LIDOCAINE HCL (PF) 1 % IJ SOLN
INTRAMUSCULAR | Status: AC
  Filled 2017-06-21: qty 60

## 2017-06-21 MED ORDER — IRBESARTAN 300 MG PO TABS
300.0000 mg | ORAL_TABLET | Freq: Every day | ORAL | Status: DC
  Administered 2017-06-22: 10:00:00 300 mg via ORAL
  Filled 2017-06-21 (×2): qty 1

## 2017-06-21 MED ORDER — HEPARIN (PORCINE) IN NACL 2-0.9 UNIT/ML-% IJ SOLN
INTRAMUSCULAR | Status: AC
  Filled 2017-06-21: qty 500

## 2017-06-21 MED ORDER — METOPROLOL SUCCINATE ER 50 MG PO TB24
50.0000 mg | ORAL_TABLET | ORAL | Status: DC
  Administered 2017-06-21: 50 mg via ORAL
  Filled 2017-06-21: qty 1

## 2017-06-21 MED ORDER — WARFARIN SODIUM 5 MG PO TABS
2.5000 mg | ORAL_TABLET | Freq: Once | ORAL | Status: AC
  Administered 2017-06-21: 2.5 mg via ORAL
  Filled 2017-06-21: qty 1

## 2017-06-21 MED ORDER — WARFARIN - PHYSICIAN DOSING INPATIENT
Freq: Every day | Status: DC
  Administered 2017-06-21: 18:00:00

## 2017-06-21 MED ORDER — MUPIROCIN 2 % EX OINT
TOPICAL_OINTMENT | CUTANEOUS | Status: AC
  Administered 2017-06-21: 1 via TOPICAL
  Filled 2017-06-21: qty 22

## 2017-06-21 SURGICAL SUPPLY — 9 items
CABLE SURGICAL S-101-97-12 (CABLE) ×1 IMPLANT
HEMOSTAT SURGICEL 2X4 FIBR (HEMOSTASIS) ×1 IMPLANT
KIT MICROINTRODUCER STIFF 5F (SHEATH) ×1 IMPLANT
LEAD CAPSURE NOVUS 5076-52CM (Lead) ×1 IMPLANT
LEAD CAPSURE NOVUS 5076-58CM (Lead) ×1 IMPLANT
PACEMAKER ASSURITY DR-RF (Pacemaker) ×1 IMPLANT
PAD DEFIB LIFELINK (PAD) ×1 IMPLANT
SHEATH CLASSIC 7F (SHEATH) ×2 IMPLANT
TRAY PACEMAKER INSERTION (PACKS) ×1 IMPLANT

## 2017-06-21 NOTE — Discharge Summary (Signed)
ELECTROPHYSIOLOGY PROCEDURE DISCHARGE SUMMARY    Patient ID: Chad [name],  MRN: [id number], DOB/AGE: [birth year] 83 y.o.  Admit date: 06/21/2017 Discharge date: 06/22/17  Primary Care Physician: [name], MD  Primary Cardiologist/Electrophysiologist: Dr. Caryl Comes  Primary Discharge Diagnosis:  1. Tachy-brady syndrome  Secondary Discharge Diagnosis:  1. Paroxysmal AFib     CHA2DS2Vasc is at least 3, on Warfarin, monitored and managed with coumadin clinic 2. HFpEF 3. HTN 4. NSVT 5. Morbid obesity 6. OSA/CPAP  Allergies  Allergen Reactions  . Carbapenems   . Cephalosporins Other (See Comments)  . Chlorpheniramine Other (See Comments)    Swells prostate  . Ciprofloxacin     Pt can not take with amiodarone   . Decongestant [Oxymetazoline]     All decongestant - makes prostate swell  . Diltiazem Swelling    Left leg swelling  . Flagyl [Metronidazole] Nausea Only    Stomach ache  . Hctz [Hydrochlorothiazide]   . Levaquin [Levofloxacin In D5w] Other (See Comments)    unknown  . Levofloxacin Nausea Only  . Lidocaine     Pt unsure of  . Penicillins Swelling    Has patient had a PCN reaction causing immediate rash, facial/tongue/throat swelling, SOB or lightheadedness with hypotension: No Has patient had a PCN reaction causing severe rash involving mucus membranes or skin necrosis: No Has patient had a PCN reaction that required hospitalization: No Has patient had a PCN reaction occurring within the last 10 years: No If all of the above answers are "NO", then may proceed with Cephalosporin use. Swelling at injection site  . Teline [Tetracycline] Swelling    Hospitalization for 2 weeks, prostate swelling  . Tetanus Toxoid Swelling    Swells arms, Please pre-medicate with benadryl.  Marland Kitchen Z-Pak [Azithromycin] Other (See Comments)    Pt can not take with Amiodarone   . Sulfonamide Derivatives Rash  . Toprol Xl [Metoprolol Tartrate] Itching and Rash    Pt states he  tolerates.     Procedures This Admission:  1.  Implantation of a SJM dual chamber PPM on 06/21/17 by Dr Caryl Comes.  The patient received St Jude MRI compatible pulse generator serial number [id number], Medtronic MRI compatible 5076 ventricular lead serial number [id number] and a Medtronic MRI compatible 5076 atrial lead serial number [id number] There were no immediate post procedure complications. 2.  CXR on 06/22/17 demonstrated no pneumothorax status post device implantation.   Brief HPI: TRACER GUTRIDGE is a 83 y.o. male was referred to electrophysiology in the outpatient setting for consideration of PPM implantation.  Past medical history includes tachy-brady syndrome, Afib, HTN HFpEF, OSA/CPAP, obesity.    Risks, benefits, and alternatives to PPM implantation were reviewed with the patient who wished to proceed.   Hospital Course:  The patient was admitted and underwent implantation of a PPM with details as outlined above.  He was monitored on telemetry overnight which demonstrated SR, PAC's, AP/VS brief PAF.  Left chest was without hematoma or ecchymosis.  The device was interrogated and found to be functioning normally.  CXR was obtained and demonstrated no pneumothorax status post device implantation.  Wound care, arm mobility, and restrictions were reviewed with the patient.  The patient was examined by Dr. Caryl Comes and considered stable for discharge to home. We are up-titrating his metoprolol.     Physical Exam: Vitals:   06/21/17 1932 06/21/17 2000 06/22/17 0359 06/22/17 0800  BP: 138/75  (!) 150/67 123/67  Pulse: 66  63 68  Resp: (!) 28 (!) 26 17 11   Temp:   97.6 F (36.4 C) 98.3 F (36.8 C)  TempSrc:   Oral Oral  SpO2: 99%  94% 94%  Weight:   266 lb 12.1 oz (121 kg)   Height:        GEN- The patient is well appearing, alert and oriented x 3 today.   HEENT: normocephalic, atraumatic; sclera clear, conjunctiva pink; hearing intact; oropharynx clear; neck supple, no JVP Lungs- CTA  b/l, normal work of breathing.  No wheezes, rales, rhonchi Heart- RRR, no murmurs, rubs or gallops, PMI not laterally displaced GI- soft, non-tender, non-distended, obese Extremities- no clubbing, cyanosis, or edema MS- no significant deformity or atrophy Skin- warm and dry, no rash or lesion, left chest without hematoma/ecchymosis Psych- euthymic mood, full affect Neuro- no gross deficits   Labs:   Lab Results  Component Value Date   WBC 7.7 06/12/2017   HGB 15.2 06/12/2017   HCT 46.3 06/12/2017   MCV 87.2 06/12/2017   PLT 216 06/12/2017   No results for input(s): NA, K, CL, CO2, BUN, CREATININE, CALCIUM, PROT, BILITOT, ALKPHOS, ALT, AST, GLUCOSE in the last 168 hours.  Invalid input(s): LABALBU  Discharge Medications:  Allergies as of 06/22/2017      Reactions   Carbapenems    Cephalosporins Other (See Comments)   Chlorpheniramine Other (See Comments)   Swells prostate   Ciprofloxacin    Pt can not take with amiodarone    Decongestant [oxymetazoline]    All decongestant - makes prostate swell   Diltiazem Swelling   Left leg swelling   Flagyl [metronidazole] Nausea Only   Stomach ache   Hctz [hydrochlorothiazide]    Levaquin [levofloxacin In D5w] Other (See Comments)   unknown   Levofloxacin Nausea Only   Lidocaine    Pt unsure of   Penicillins Swelling   Has patient had a PCN reaction causing immediate rash, facial/tongue/throat swelling, SOB or lightheadedness with hypotension: No Has patient had a PCN reaction causing severe rash involving mucus membranes or skin necrosis: No Has patient had a PCN reaction that required hospitalization: No Has patient had a PCN reaction occurring within the last 10 years: No If all of the above answers are "NO", then may proceed with Cephalosporin use. Swelling at injection site   Teline [tetracycline] Swelling   Hospitalization for 2 weeks, prostate swelling   Tetanus Toxoid Swelling   Swells arms, Please pre-medicate with  benadryl.   Z-pak [azithromycin] Other (See Comments)   Pt can not take with Amiodarone    Sulfonamide Derivatives Rash   Toprol Xl [metoprolol Tartrate] Itching, Rash   Pt states he tolerates.      Medication List    TAKE these medications   Calcium-Magnesium 500-250 MG Tabs Take 1 tablet by mouth daily.   CENTRUM SILVER PO Take 1 tablet by mouth daily.   dofetilide 250 MCG capsule Commonly known as:  TIKOSYN TAKE ONE CAPSULE BY MOUTH TWICE DAILY   EQL COQ10 100 MG capsule Generic drug:  Coenzyme Q10 Take 200 mg by mouth daily.   escitalopram 10 MG tablet Commonly known as:  LEXAPRO Take 10 mg by mouth at bedtime.   furosemide 20 MG tablet Commonly known as:  LASIX Take 20 mg by mouth daily as needed for fluid or edema.   irbesartan 300 MG tablet Commonly known as:  AVAPRO Take 1 tablet (300 mg total) by mouth daily.   metoprolol succinate 50 MG 24 hr tablet  Commonly known as:  TOPROL-XL Take 1 tablet (50 mg total) by mouth daily. Take with or immediately following a meal.   OXYGEN Inhale 2 L into the lungs at bedtime. Uses with CPAP machine   vitamin B-12 500 MCG tablet Commonly known as:  CYANOCOBALAMIN Take 500 mcg by mouth 2 (two) times daily.   Vitamin D3 2000 units capsule Take 2,000 Units by mouth 2 (two) times daily.   warfarin 5 MG tablet Commonly known as:  COUMADIN TAKE ONE-HALF TO ONE TABLET BY MOUTH ONCE DAILY AS DIRECTED BY COUMADIN CLINIC What changed:  See the new instructions. Notes to patient:  Continue your current dosing schedule and follow up with coumadin clinic as scheduled       Disposition:  Home Discharge Instructions    Diet - low sodium heart healthy    Complete by:  As directed    Increase activity slowly    Complete by:  As directed      Follow-up Information    [facility] Follow up on 07/03/2017.   Specialty:  Cardiology Why:  1:15PM, coumadin clinic Contact information: [place] [phone]       [facility] Follow up on 07/06/2017.   Specialty:  Cardiology Why:  3:00PM, wound check Contact information: [address], [address] [place]       Deboraha Sprang, MD Follow up on 09/28/2017.   Specialty:  Cardiology Why:  2:15PM Contact information: [address] [address] [phone]           Duration of Discharge Encounter: Greater than 30 minutes including physician time.  Signed, Tommye Standard, PA-C 06/22/2017 9:21 AM  Seen and examined, instructions given Increase BB for hyperension and tachycarida

## 2017-06-21 NOTE — Care Management Note (Addendum)
Case Management Note  Patient Details  Name: Chad [name] MRN: [id number] Date of Birth: Dec 26, 1928  Subjective/Objective:  From home with wife, Admitted for dual chamber pace maker, s/p pace maker implant.   He has already been taking tykosyn. Uses cpap at home.               Action/Plan: NCM will follow for dc needs.  Expected Discharge Date:                  Expected Discharge Plan:     In-House Referral:     Discharge planning Services  CM Consult  Post Acute Care Choice:    Choice offered to:     DME Arranged:    DME Agency:     HH Arranged:    HH Agency:     Status of Service:  In process, will continue to follow  If discussed at Long Length of Stay Meetings, dates discussed:    Additional Comments:  Zenon Mayo, RN 06/21/2017, 9:15 PM

## 2017-06-21 NOTE — Discharge Instructions (Signed)
° ° °  Supplemental Discharge Instructions for  Pacemaker/Defibrillator Patients  Activity No heavy lifting or vigorous activity with your left/right arm for 6 to 8 weeks.  Do not raise your left/right arm above your head for one week.  Gradually raise your affected arm as drawn below.            06/25/17                     06/26/17                     06/27/17                   06/28/17 __  NO DRIVING for 1 week ; you may begin driving on  8/78/67  .  WOUND CARE - Keep the wound area clean and dry.  Do not get this area wet,no showers for 24 hours; you may shower on 06/22/17 evening  . - The tape/steri-strips on your wound will fall off; do not pull them off.  No bandage is needed on the site.  DO  NOT apply any creams, oils, or ointments to the wound area. - If you notice any drainage or discharge from the wound, any swelling or bruising at the site, or you develop a fever > 101? F after you are discharged home, call the office at once.  Special Instructions - You are still able to use cellular telephones; use the ear opposite the side where you have your pacemaker/defibrillator.  Avoid carrying your cellular phone near your device. - When traveling through airports, show security personnel your identification card to avoid being screened in the metal detectors.  Ask the security personnel to use the hand wand. - Avoid arc welding equipment, MRI testing (magnetic resonance imaging), TENS units (transcutaneous nerve stimulators).  Call the office for questions about other devices. - Avoid electrical appliances that are in poor condition or are not properly grounded. - Microwave ovens are safe to be near or to operate.  Additional information for defibrillator patients should your device go off: - If your device goes off ONCE and you feel fine afterward, notify the device clinic nurses. - If your device goes off ONCE and you do not feel well afterward, call 911. - If your device goes off TWICE,  call 911. - If your device goes off THREE times in one day, call 911.  DO NOT DRIVE YOURSELF OR A FAMILY MEMBER WITH A DEFIBRILLATOR TO THE HOSPITAL--CALL 911.

## 2017-06-21 NOTE — Progress Notes (Signed)
Patient has home CPAP set up at bedside. RT connected 2L bleed in, per home use. Patient can place on himself. RT will continue to monitor.

## 2017-06-21 NOTE — Progress Notes (Signed)
Pt with flag MDRO contact precaution. Spoke & verified by  Kingsley Spittle RN (ID RN)). As per Cherrie RN to take off pt from contact precaution.

## 2017-06-21 NOTE — H&P (Signed)
Patient Care Team: [name], MD as PCP - General (Family Medicine)   HPI  Chad [name] is a 83 y.o. male afmitted for dual chamber pacing  He has hx of persistent Afib for which he has taken dofetilide  Afib has more persistent VVR but also  sinus bradycardia  He is anticoagulated with warfarin; has bleeding 2/2 epistaxis  Continues to struggle withDOE    Thromboembolic risk factors ( age  -61, HTN-1) for a CHADSVASc Score of 3   *INR 2.2 on 7/30 Past Medical History:  Diagnosis Date  . Cancer ([place]) 2010   bladder  . Hypertension   . Hypoglycemia   . Persistent atrial fibrillation ([place])       . Sensory ataxia   . Sleep apnea    CPAP    Past Surgical History:  Procedure Laterality Date  . CARDIOVERSION N/A 04/23/2015   Procedure: CARDIOVERSION;  Surgeon: [name], MD;  Location: [facility];  Service: Cardiovascular;  Laterality: N/A;  . CARDIOVERSION N/A 07/30/2015   Procedure: CARDIOVERSION;  Surgeon: [name], MD;  Location[facility] ENDOSCOPY;  Service: Cardiovascular;  Laterality: N/A;  . LUMBAR [place]    No current facility-administered medications for this encounter.     Allergies  Allergen Reactions  . Carbapenems   . Cephalosporins Other (See Comments)  . Chlorpheniramine Other (See Comments)    Swells prostate  . Ciprofloxacin     Pt can not take with amiodarone   . Decongestant [Oxymetazoline]     All decongestant - makes prostate swell  . Diltiazem Swelling    Left leg swelling  . Flagyl [Metronidazole] Nausea Only    Stomach ache  . Hctz [Hydrochlorothiazide]   . Levaquin [Levofloxacin In D5w] Other (See Comments)    unknown  . Levofloxacin Nausea Only  . Lidocaine     Pt unsure of  . Penicillins Swelling    Has patient had a PCN reaction causing immediate rash, facial/tongue/throat swelling, SOB or lightheadedness with hypotension: No Has patient had a PCN reaction causing severe rash involving mucus membranes  or skin necrosis: No Has patient had a PCN reaction that required hospitalization: No Has patient had a PCN reaction occurring within the last 10 years: No If all of the above answers are "NO", then may proceed with Cephalosporin use. Swelling at injection site  . Teline [Tetracycline] Swelling    Hospitalization for 2 weeks, prostate swelling  . Tetanus Toxoid Swelling    Swells arms, Please pre-medicate with benadryl.  Marland Kitchen Z-Pak [Azithromycin] Other (See Comments)    Pt can not take with Amiodarone   . Sulfonamide Derivatives Rash  . Toprol Xl [Metoprolol Tartrate] Itching and Rash    Pt states he tolerates.    Scheduled Meds: . chlorhexidine  60 mL Topical Once   Continuous Infusions: PRN Meds:.  No current facility-administered medications on file prior to encounter.    Current Outpatient Prescriptions on File Prior to Encounter  Medication Sig Dispense Refill  . Calcium-Magnesium 500-250 MG TABS Take 1 tablet by mouth daily.      . Coenzyme Q10 (EQL COQ10) 100 MG capsule Take 200 mg by mouth daily.     Marland Kitchen dofetilide (TIKOSYN) 250 MCG capsule TAKE ONE CAPSULE BY MOUTH TWICE DAILY 180 capsule 2  . escitalopram (LEXAPRO) 10 MG tablet Take 10 mg by mouth at bedtime.     . furosemide (LASIX) 20 MG tablet Take 20 mg by  mouth daily as needed for fluid or edema.    . irbesartan (AVAPRO) 300 MG tablet Take 1 tablet (300 mg total) by mouth daily. 30 tablet 9  . metoprolol succinate (TOPROL-XL) 25 MG 24 hr tablet Take 0.5 tablets (12.5 mg total) by mouth daily. (Patient taking differently: Take 12.5 mg by mouth every evening. ) 30 tablet 6  . Multiple Vitamins-Minerals (CENTRUM SILVER PO) Take 1 tablet by mouth daily.      . OXYGEN Inhale 2 L into the lungs at bedtime. Uses with CPAP machine    . warfarin (COUMADIN) 5 MG tablet TAKE ONE-HALF TO ONE TABLET BY MOUTH ONCE DAILY AS DIRECTED BY COUMADIN CLINIC (Patient taking differently: TAKE 0.5 TABLET (2.5 MG) BY MOUTH ON SUNDAY, TUESDAY,  THURSDAY, & SATURDAY. TAKE 1 TABLET (5MG ) BY MOUTH ON MONDAY, WEDNESDAY, & FRIDAY) 90 tablet 1    Review of Systems negative except from HPI and PMH  Physical Exam BP (!) 185/95 (BP Location: Right Arm)   Pulse 78   Temp 97.7 F (36.5 C) (Oral)   Ht 5\' 9"  (1.753 m)   Wt 268 lb (121.6 kg)   SpO2 94%   BMI 39.58 kg/m  Well developed and nourished in no acute distress HENT normal Neck supple with JVP-flat Carotids brisk and full without bruits Clear Irregularly irregulart rate and rhythm, no murmurs or gallops Abd-soft with active BS without hepatomegaly No Clubbing cyanosis tr edema Skin-warm and dry A & Oriented  Grossly normal sensory and motor function       Assessment and  Plan VT-NS  Atrial fib VVR  Sinus bradycardia  Morbidly obese  OSA treated  HFpEF  Tachybradycardia syndrome for backup bradycardia pacing. We have reviewed benefits and risks with patient and his wife. The plan will be to utilize it felt like going forward to see if we can identify fewer symptoms associated with sinus rhythm. In the event that we can continue otherwise we will discontinue and focus on  Rate Control

## 2017-06-22 ENCOUNTER — Ambulatory Visit (HOSPITAL_COMMUNITY): Payer: Medicare Other

## 2017-06-22 ENCOUNTER — Encounter (HOSPITAL_COMMUNITY): Payer: Self-pay | Admitting: Internal Medicine

## 2017-06-22 DIAGNOSIS — Z884 Allergy status to anesthetic agent status: Secondary | ICD-10-CM | POA: Diagnosis not present

## 2017-06-22 DIAGNOSIS — Z8551 Personal history of malignant neoplasm of bladder: Secondary | ICD-10-CM | POA: Diagnosis not present

## 2017-06-22 DIAGNOSIS — I481 Persistent atrial fibrillation: Secondary | ICD-10-CM | POA: Diagnosis not present

## 2017-06-22 DIAGNOSIS — Z881 Allergy status to other antibiotic agents status: Secondary | ICD-10-CM | POA: Diagnosis not present

## 2017-06-22 DIAGNOSIS — Z887 Allergy status to serum and vaccine status: Secondary | ICD-10-CM | POA: Diagnosis not present

## 2017-06-22 DIAGNOSIS — I472 Ventricular tachycardia: Secondary | ICD-10-CM | POA: Diagnosis not present

## 2017-06-22 DIAGNOSIS — I480 Paroxysmal atrial fibrillation: Secondary | ICD-10-CM | POA: Diagnosis not present

## 2017-06-22 DIAGNOSIS — I495 Sick sinus syndrome: Secondary | ICD-10-CM

## 2017-06-22 DIAGNOSIS — Z6839 Body mass index (BMI) 39.0-39.9, adult: Secondary | ICD-10-CM | POA: Diagnosis not present

## 2017-06-22 DIAGNOSIS — Z882 Allergy status to sulfonamides status: Secondary | ICD-10-CM | POA: Diagnosis not present

## 2017-06-22 DIAGNOSIS — Z9989 Dependence on other enabling machines and devices: Secondary | ICD-10-CM | POA: Diagnosis not present

## 2017-06-22 DIAGNOSIS — I5030 Unspecified diastolic (congestive) heart failure: Secondary | ICD-10-CM | POA: Diagnosis not present

## 2017-06-22 DIAGNOSIS — I110 Hypertensive heart disease with heart failure: Secondary | ICD-10-CM | POA: Diagnosis not present

## 2017-06-22 DIAGNOSIS — Z952 Presence of prosthetic heart valve: Secondary | ICD-10-CM | POA: Diagnosis not present

## 2017-06-22 DIAGNOSIS — G4730 Sleep apnea, unspecified: Secondary | ICD-10-CM | POA: Diagnosis not present

## 2017-06-22 DIAGNOSIS — Z880 Allergy status to penicillin: Secondary | ICD-10-CM | POA: Diagnosis not present

## 2017-06-22 LAB — PROTIME-INR
INR: 1.96
PROTHROMBIN TIME: 22.6 s — AB (ref 11.4–15.2)

## 2017-06-22 MED ORDER — METOPROLOL SUCCINATE ER 50 MG PO TB24: 50.0000 mg | ORAL_TABLET | ORAL | 6 refills | Status: DC

## 2017-06-22 MED ORDER — METOPROLOL SUCCINATE ER 50 MG PO TB24: 50.0000 mg | ORAL_TABLET | ORAL | 5 refills | Status: DC

## 2017-06-22 NOTE — Progress Notes (Signed)
Pt being discharged home via wheelchair with family. Pt alert and oriented x4. VSS. Pt c/o no pain at this time. No signs of respiratory distress. Education complete and care plans resolved. IV removed with catheter intact and pt tolerated well. No further issues at this time. Pt to follow up with PCP. Koraline Phillipson R, RN 

## 2017-07-03 ENCOUNTER — Ambulatory Visit (INDEPENDENT_AMBULATORY_CARE_PROVIDER_SITE_OTHER): Payer: Medicare Other | Admitting: *Deleted

## 2017-07-03 DIAGNOSIS — I4819 Other persistent atrial fibrillation: Secondary | ICD-10-CM

## 2017-07-03 DIAGNOSIS — I481 Persistent atrial fibrillation: Secondary | ICD-10-CM | POA: Diagnosis not present

## 2017-07-03 DIAGNOSIS — Z5181 Encounter for therapeutic drug level monitoring: Secondary | ICD-10-CM

## 2017-07-03 LAB — POCT INR: INR: 3

## 2017-07-06 ENCOUNTER — Ambulatory Visit (INDEPENDENT_AMBULATORY_CARE_PROVIDER_SITE_OTHER): Payer: Medicare Other | Admitting: *Deleted

## 2017-07-06 DIAGNOSIS — I495 Sick sinus syndrome: Secondary | ICD-10-CM

## 2017-07-06 DIAGNOSIS — I481 Persistent atrial fibrillation: Secondary | ICD-10-CM

## 2017-07-06 DIAGNOSIS — I4819 Other persistent atrial fibrillation: Secondary | ICD-10-CM

## 2017-07-06 LAB — CUP PACEART INCLINIC DEVICE CHECK
Battery Voltage: 3.04 V
Date Time Interrogation Session: 20180823161611
Implantable Lead Location: 753859
Implantable Lead Model: 5076
Implantable Pulse Generator Implant Date: 20180808
Lead Channel Impedance Value: 450 Ohm
Lead Channel Pacing Threshold Amplitude: 0.5 V
Lead Channel Pacing Threshold Amplitude: 0.5 V
Lead Channel Pacing Threshold Pulse Width: 0.5 ms
Lead Channel Sensing Intrinsic Amplitude: 12 mV
Lead Channel Sensing Intrinsic Amplitude: 3.2 mV
Lead Channel Setting Pacing Amplitude: 3.5 V
Lead Channel Setting Pacing Amplitude: 3.5 V
Lead Channel Setting Pacing Pulse Width: 0.5 ms
Lead Channel Setting Sensing Sensitivity: 2 mV
MDC IDC LEAD IMPLANT DT: 20180808
MDC IDC LEAD IMPLANT DT: 20180808
MDC IDC LEAD LOCATION: 753860
MDC IDC MSMT BATTERY REMAINING LONGEVITY: 86 mo
MDC IDC MSMT LEADCHNL RA PACING THRESHOLD AMPLITUDE: 0.75 V
MDC IDC MSMT LEADCHNL RA PACING THRESHOLD AMPLITUDE: 0.75 V
MDC IDC MSMT LEADCHNL RA PACING THRESHOLD PULSEWIDTH: 0.5 ms
MDC IDC MSMT LEADCHNL RA PACING THRESHOLD PULSEWIDTH: 0.5 ms
MDC IDC MSMT LEADCHNL RV IMPEDANCE VALUE: 537.5 Ohm
MDC IDC MSMT LEADCHNL RV PACING THRESHOLD PULSEWIDTH: 0.5 ms
MDC IDC STAT BRADY RA PERCENT PACED: 76 %
MDC IDC STAT BRADY RV PERCENT PACED: 4.7 %
Pulse Gen Model: 2272
Pulse Gen Serial Number: 8932564

## 2017-07-06 NOTE — Progress Notes (Signed)
Wound check appointment. Steri-strips removed. Wound without redness or edema. Incision edges approximated, wound well healed. Normal device function. Thresholds, sensing, and impedances consistent with implant measurements. Device programmed at 3.5V on for extra safety margin until 3 month visit. Histogram distribution appropriate for patient and level of activity. 1 mode switches (<1%) Tikosyn, Warfarin-- duration 4 seconds, Peak A rate 167 bpm, Peak V rate 66 bpm. 1 high ventricular rates noted-- EGM appears 1:1, duration 49 seconds, Avg rate 161 bpm. 2 PMT episodes noted, VA conduction 348 ms, PVARP 275 ms. Patient educated about wound care, arm mobility, lifting restrictions. ROV with SK 09/28/17.

## 2017-07-10 ENCOUNTER — Ambulatory Visit: Payer: Medicare Other | Admitting: Neurology

## 2017-07-21 ENCOUNTER — Other Ambulatory Visit: Payer: Self-pay | Admitting: Nurse Practitioner

## 2017-07-25 ENCOUNTER — Ambulatory Visit (INDEPENDENT_AMBULATORY_CARE_PROVIDER_SITE_OTHER): Payer: Medicare Other | Admitting: *Deleted

## 2017-07-25 DIAGNOSIS — I4819 Other persistent atrial fibrillation: Secondary | ICD-10-CM

## 2017-07-25 DIAGNOSIS — I481 Persistent atrial fibrillation: Secondary | ICD-10-CM

## 2017-07-25 DIAGNOSIS — Z5181 Encounter for therapeutic drug level monitoring: Secondary | ICD-10-CM | POA: Diagnosis not present

## 2017-07-25 LAB — POCT INR: INR: 4.1

## 2017-08-07 ENCOUNTER — Ambulatory Visit (INDEPENDENT_AMBULATORY_CARE_PROVIDER_SITE_OTHER): Payer: Medicare Other | Admitting: *Deleted

## 2017-08-07 DIAGNOSIS — I4819 Other persistent atrial fibrillation: Secondary | ICD-10-CM

## 2017-08-07 DIAGNOSIS — Z5181 Encounter for therapeutic drug level monitoring: Secondary | ICD-10-CM

## 2017-08-07 DIAGNOSIS — I481 Persistent atrial fibrillation: Secondary | ICD-10-CM | POA: Diagnosis not present

## 2017-08-07 LAB — POCT INR: INR: 2.1

## 2017-08-18 DIAGNOSIS — I4891 Unspecified atrial fibrillation: Secondary | ICD-10-CM | POA: Diagnosis not present

## 2017-08-18 DIAGNOSIS — E1129 Type 2 diabetes mellitus with other diabetic kidney complication: Secondary | ICD-10-CM | POA: Diagnosis not present

## 2017-08-18 DIAGNOSIS — Z1389 Encounter for screening for other disorder: Secondary | ICD-10-CM | POA: Diagnosis not present

## 2017-08-18 DIAGNOSIS — Z6841 Body Mass Index (BMI) 40.0 and over, adult: Secondary | ICD-10-CM | POA: Diagnosis not present

## 2017-08-20 ENCOUNTER — Other Ambulatory Visit: Payer: Self-pay | Admitting: Cardiology

## 2017-08-28 ENCOUNTER — Ambulatory Visit (INDEPENDENT_AMBULATORY_CARE_PROVIDER_SITE_OTHER): Payer: Medicare Other | Admitting: *Deleted

## 2017-08-28 DIAGNOSIS — I481 Persistent atrial fibrillation: Secondary | ICD-10-CM | POA: Diagnosis not present

## 2017-08-28 DIAGNOSIS — Z5181 Encounter for therapeutic drug level monitoring: Secondary | ICD-10-CM | POA: Diagnosis not present

## 2017-08-28 DIAGNOSIS — I4819 Other persistent atrial fibrillation: Secondary | ICD-10-CM

## 2017-08-28 LAB — POCT INR: INR: 2.3

## 2017-08-29 ENCOUNTER — Telehealth: Payer: Self-pay | Admitting: Internal Medicine

## 2017-08-29 ENCOUNTER — Ambulatory Visit (INDEPENDENT_AMBULATORY_CARE_PROVIDER_SITE_OTHER): Payer: Self-pay | Admitting: *Deleted

## 2017-08-29 DIAGNOSIS — R001 Bradycardia, unspecified: Secondary | ICD-10-CM

## 2017-08-29 MED ORDER — CLINDAMYCIN HCL 300 MG PO CAPS: 300.0000 mg | ORAL_CAPSULE | Freq: Three times a day (TID) | ORAL | 0 refills | Status: DC

## 2017-08-29 MED ORDER — CLINDAMYCIN HCL 300 MG PO CAPS: 300.0000 mg | ORAL_CAPSULE | Freq: Three times a day (TID) | ORAL | 0 refills | Status: AC

## 2017-08-29 NOTE — Telephone Encounter (Signed)
Informed patient that he needs to be seen in the Device clinic today. Patient verbalized understanding. Appt made for 1500.

## 2017-08-29 NOTE — Telephone Encounter (Signed)
New message    Patient calling states he has a little blood around pacemaker site.   1. Has your device fired? NO  2. Is you device beeping? NO  3. Are you experiencing draining or swelling at device site? A little spot has blood  4. Are you calling to see if we received your device transmission? NO  5. Have you passed out? NO    Please route to [place]

## 2017-08-30 LAB — CUP PACEART INCLINIC DEVICE CHECK
Battery Voltage: 3.01 V
Brady Statistic RA Percent Paced: 68 %
Implantable Lead Implant Date: 20180808
Implantable Lead Location: 753860
Implantable Pulse Generator Implant Date: 20180808
Lead Channel Impedance Value: 450 Ohm
Lead Channel Sensing Intrinsic Amplitude: 12 mV
Lead Channel Setting Pacing Amplitude: 3.5 V
Lead Channel Setting Pacing Amplitude: 3.5 V
Lead Channel Setting Sensing Sensitivity: 2 mV
MDC IDC LEAD IMPLANT DT: 20180808
MDC IDC LEAD LOCATION: 753859
MDC IDC MSMT BATTERY REMAINING LONGEVITY: 86 mo
MDC IDC MSMT LEADCHNL RA SENSING INTR AMPL: 3.5 mV
MDC IDC MSMT LEADCHNL RV IMPEDANCE VALUE: 512.5 Ohm
MDC IDC PG SERIAL: 8932564
MDC IDC SESS DTM: 20181016192006
MDC IDC SET LEADCHNL RV PACING PULSEWIDTH: 0.5 ms
MDC IDC STAT BRADY RV PERCENT PACED: 4.5 %
Pulse Gen Model: 2272

## 2017-08-30 NOTE — Progress Notes (Signed)
Wound check appointment d/t bleeding s/p ppm implant on 06/21/17. Small pinhole noted on R corner of incision. Dr.Klein assessed wound. No obvious stitch identified. Dr.Klein recommended Clindamycin 300mg  TID x 7days, and to lightly pack wound with bacitracin ointment and iodoform gauze. Bandaid was also applied over the site. Patient/wife was given extra supplies for dressing change in 3 days. Wife was instructed on how to do this and verbalized understanding. Encouraged patient to call if he notices any fever/chills or further abnormalities with the site. Patient to follow up on 10/23 @ 1400 for wound recheck.   Pacemaker check in clinic for underlying rhythm only. Testing was not performed this session. No changes made.

## 2017-09-05 ENCOUNTER — Ambulatory Visit (INDEPENDENT_AMBULATORY_CARE_PROVIDER_SITE_OTHER): Payer: Self-pay | Admitting: *Deleted

## 2017-09-05 DIAGNOSIS — R001 Bradycardia, unspecified: Secondary | ICD-10-CM

## 2017-09-05 NOTE — Progress Notes (Signed)
Wound recheck appt. Small pinhole remains on R corner of incision, with minimal redness around the edges. Pinhole appears to be healing well from the inside. Dr.Klein assessed wound and recommended lightly packing wound with bacitracin ointment and iodoform gauze. Bandaid was also applied over the site. Patient/wife was given extra supplies for dressing change in 3 days. Wife was instructed on how to do this and verbalized understanding. Encouraged patient to call if he notices any fever/chills or further abnormalities with the site. Patient to follow up on 10/30 @ 1400 for wound recheck.

## 2017-09-12 ENCOUNTER — Ambulatory Visit (INDEPENDENT_AMBULATORY_CARE_PROVIDER_SITE_OTHER): Payer: Self-pay | Admitting: *Deleted

## 2017-09-12 DIAGNOSIS — R001 Bradycardia, unspecified: Secondary | ICD-10-CM

## 2017-09-12 NOTE — Progress Notes (Signed)
Chad [name] presents today for a wound re-check s/p PPM implant 06/21/17. Bandaid removed. Small scab noted at medial edge of left chest incision. Unable to express any drainage. No edema noted. Patient denies fever or chills. Dr. Caryl Comes evaluated incision- advised patient to apply warm compress to incision twice daily for 1 more week and to call back if there was any redness, swelling, drainage or fever noted. Patient and wife verbalize understanding. No dressing applied over incision. Follow up with Dr. Caryl Comes as scheduled 09/28/17.

## 2017-09-15 ENCOUNTER — Encounter: Payer: Self-pay | Admitting: Internal Medicine

## 2017-09-25 ENCOUNTER — Ambulatory Visit (INDEPENDENT_AMBULATORY_CARE_PROVIDER_SITE_OTHER): Payer: Medicare Other | Admitting: *Deleted

## 2017-09-25 DIAGNOSIS — Z5181 Encounter for therapeutic drug level monitoring: Secondary | ICD-10-CM | POA: Diagnosis not present

## 2017-09-25 DIAGNOSIS — I4819 Other persistent atrial fibrillation: Secondary | ICD-10-CM

## 2017-09-25 DIAGNOSIS — I481 Persistent atrial fibrillation: Secondary | ICD-10-CM | POA: Diagnosis not present

## 2017-09-25 LAB — POCT INR: INR: 2.3

## 2017-09-28 ENCOUNTER — Ambulatory Visit: Payer: Medicare Other | Admitting: Internal Medicine

## 2017-09-28 ENCOUNTER — Encounter: Payer: Self-pay | Admitting: Internal Medicine

## 2017-09-28 VITALS — BP 140/74 | HR 63 | Ht 69.0 in | Wt 268.2 lb

## 2017-09-28 DIAGNOSIS — I495 Sick sinus syndrome: Secondary | ICD-10-CM | POA: Diagnosis not present

## 2017-09-28 DIAGNOSIS — I480 Paroxysmal atrial fibrillation: Secondary | ICD-10-CM

## 2017-09-28 DIAGNOSIS — Z950 Presence of cardiac pacemaker: Secondary | ICD-10-CM

## 2017-09-28 LAB — CUP PACEART INCLINIC DEVICE CHECK
Brady Statistic RA Percent Paced: 64 %
Date Time Interrogation Session: 20181115165616
Implantable Lead Implant Date: 20180808
Implantable Lead Location: 753859
Implantable Lead Model: 5076
Implantable Lead Model: 5076
Implantable Pulse Generator Implant Date: 20180808
Lead Channel Impedance Value: 462.5 Ohm
Lead Channel Pacing Threshold Amplitude: 0.5 V
Lead Channel Pacing Threshold Amplitude: 0.5 V
Lead Channel Pacing Threshold Amplitude: 0.75 V
Lead Channel Pacing Threshold Pulse Width: 0.5 ms
Lead Channel Pacing Threshold Pulse Width: 0.5 ms
Lead Channel Sensing Intrinsic Amplitude: 12 mV
Lead Channel Sensing Intrinsic Amplitude: 3 mV
Lead Channel Setting Pacing Amplitude: 2 V
Lead Channel Setting Pacing Amplitude: 2.5 V
Lead Channel Setting Pacing Pulse Width: 0.5 ms
Lead Channel Setting Sensing Sensitivity: 2 mV
MDC IDC LEAD IMPLANT DT: 20180808
MDC IDC LEAD LOCATION: 753860
MDC IDC MSMT BATTERY REMAINING LONGEVITY: 122 mo
MDC IDC MSMT BATTERY VOLTAGE: 3.01 V
MDC IDC MSMT LEADCHNL RA PACING THRESHOLD AMPLITUDE: 0.75 V
MDC IDC MSMT LEADCHNL RA PACING THRESHOLD PULSEWIDTH: 0.5 ms
MDC IDC MSMT LEADCHNL RA PACING THRESHOLD PULSEWIDTH: 0.5 ms
MDC IDC MSMT LEADCHNL RV IMPEDANCE VALUE: 512.5 Ohm
MDC IDC PG SERIAL: 8932564
MDC IDC STAT BRADY RV PERCENT PACED: 3.9 %
Pulse Gen Model: 2272

## 2017-09-28 MED ORDER — METOPROLOL SUCCINATE ER 50 MG PO TB24: ORAL_TABLET | ORAL | 2 refills | Status: DC

## 2017-09-28 NOTE — Patient Instructions (Addendum)
Medication Instructions: Your physician has recommended you make the following change in your medication:  -1) INCREASE Metoprolol 75 mg - Take 1 tablet by mouth in the AM and 0.5 tablet by mouth in the PM  Labwork: Your physician has recommended that you have lab work today: BMET and MAG  Procedures/Testing: None Ordered  Follow-Up: Your physician recommends that you schedule a follow-up appointment in: 3 MONTHS with Dr. Percival Spanish   Your physician wants you to follow-up in: 9 MONTHS with Dr. Caryl Comes. You will receive a reminder letter in the mail two months in advance. If you don't receive a letter, please call our office to schedule the follow-up appointment.   If you need a refill on your cardiac medications before your next appointment, please call your pharmacy.

## 2017-09-28 NOTE — Progress Notes (Signed)
Patient Care Team: [name], MD as PCP - General (Family Medicine)   HPI  Chad [name] is a 83 y.o. male Seen in follow-up for atrial fibrillation nonsustained ventricular tachycardia in the context of sinus bradycardia and dofetilide therapy. When he was seen 1/18 recommendations admit to stop dofetilide and undertake pacing. I discussed this with Dr. [facility] He underwent pacing 8/18.  No interval epistaxis. He continues to have episodes of tachycardia ; These are heart rates into the 130s.  It lasted as long as 1 hour detected by the device.   Overall shortness of breath and exercise intolerance is no better.  Records and Results Reviewed labs from ER vsist  Date Cr K Mg Hgb  7/18  1.16 4.5 2.2 15.2            Past Medical History:  Diagnosis Date  . Bladder cancer ([place]) 2010  . Chronic bronchitis ([place])   . Hypertension   . Hypoglycemia   . On home oxygen therapy    "2L w/CPAP at night" (06/21/2017)  . OSA on CPAP    "w/2L O2 at night" (06/21/2017)  . Persistent atrial fibrillation ([place])       . Pneumonia    "when he was young; again in 2016" (06/21/2017)  . Presence of permanent cardiac pacemaker   . Sensory ataxia     Past Surgical History:  Procedure Laterality Date  . BACK SURGERY    . CARDIOVERSION N/A 04/23/2015   Procedure: CARDIOVERSION;  Surgeon: [name], MD;  Location: [place];  Service: Cardiovascular;  Laterality: N/A;  . CARDIOVERSION N/A 07/30/2015   Procedure: CARDIOVERSION;  Surgeon: [name], MD;  Location: [facility] ENDOSCOPY;  Service: Cardiovascular;  Laterality: N/A;  . EYE SURGERY Bilateral 2009   "to lower pressure; no glaucoma" (06/21/2017)  . INSERT / REPLACE / REMOVE PACEMAKER  06/21/2017  . [place]  . PACEMAKER IMPLANT N/A 06/21/2017   Procedure: Pacemaker Implant;  Surgeon: Deboraha Sprang, MD;  Location: [place] CV LAB;  Service: Cardiovascular;  Laterality: N/A;  . TRANSURETHRAL RESECTION OF BLADDER  TUMOR WITH GYRUS (TURBT-GYRUS)  07/10/2009    Cystourethroscopy, gyrus TURBT (transurethral  resection of bladder tumor).[name] 03/16/2011    Current Outpatient Medications  Medication Sig Dispense Refill  . Calcium-Magnesium 500-250 MG TABS Take 1 tablet by mouth daily.      . Cholecalciferol (VITAMIN D3) 2000 units capsule Take 2,000 Units by mouth 2 (two) times daily.    . Coenzyme Q10 (EQL COQ10) 100 MG capsule Take 200 mg by mouth daily.     Marland Kitchen dofetilide (TIKOSYN) 250 MCG capsule TAKE ONE CAPSULE BY MOUTH TWICE DAILY 180 capsule 2  . escitalopram (LEXAPRO) 10 MG tablet Take 10 mg by mouth at bedtime.     . furosemide (LASIX) 20 MG tablet Take 20 mg by mouth daily as needed for fluid or edema.    . irbesartan (AVAPRO) 300 MG tablet TAKE ONE TABLET BY MOUTH ONCE DAILY 30 tablet 9  . metoprolol succinate (TOPROL-XL) 50 MG 24 hr tablet Take 1 tablet (50 mg total) by mouth daily. Take with or immediately following a meal. 30 tablet 5  . Multiple Vitamins-Minerals (CENTRUM SILVER PO) Take 1 tablet by mouth daily.      . OXYGEN Inhale 2 L into the lungs at bedtime. Uses with CPAP machine    . vitamin B-12 (CYANOCOBALAMIN) 500 MCG tablet Take 500 mcg by mouth  2 (two) times daily.    Marland Kitchen warfarin (COUMADIN) 5 MG tablet TAKE ONE-HALF TO ONE TABLET BY MOUTH ONCE DAILY AS DIRECTED BY COUMADIN CLINIC (Patient taking differently: TAKE 0.5 TABLET (2.5 MG) BY MOUTH ON SUNDAY, TUESDAY, THURSDAY, & SATURDAY. TAKE 1 TABLET (5MG ) BY MOUTH ON MONDAY, WEDNESDAY, & FRIDAY) 90 tablet 1   No current facility-administered medications for this visit.     Allergies  Allergen Reactions  . Carbapenems   . Cephalosporins Other (See Comments)  . Chlorpheniramine Other (See Comments)    Swells prostate  . Ciprofloxacin     Pt can not take with amiodarone   . Decongestant [Oxymetazoline]     All decongestant - makes prostate swell  . Diltiazem Swelling    Left leg swelling  . Flagyl [Metronidazole] Nausea Only     Stomach ache  . Hctz [Hydrochlorothiazide]   . Levaquin [Levofloxacin In D5w] Other (See Comments)    unknown  . Levofloxacin Nausea Only  . Lidocaine     Pt unsure of  . Penicillins Swelling    Has patient had a PCN reaction causing immediate rash, facial/tongue/throat swelling, SOB or lightheadedness with hypotension: No Has patient had a PCN reaction causing severe rash involving mucus membranes or skin necrosis: No Has patient had a PCN reaction that required hospitalization: No Has patient had a PCN reaction occurring within the last 10 years: No If all of the above answers are "NO", then may proceed with Cephalosporin use. Swelling at injection site  . Teline [Tetracycline] Swelling    Hospitalization for 2 weeks, prostate swelling  . Tetanus Toxoid Swelling    Swells arms, Please pre-medicate with benadryl.  Marland Kitchen Z-Pak [Azithromycin] Other (See Comments)    Pt can not take with Amiodarone   . Sulfonamide Derivatives Rash  . Toprol Xl [Metoprolol Tartrate] Itching and Rash    Pt states he tolerates.      Review of Systems negative except from HPI and PMH  Physical Exam BP 140/74   Pulse 63   Ht 5\' 9"  (1.753 m)   Wt 268 lb 3.2 oz (121.7 kg)   SpO2 93%   BMI 39.61 kg/m  Well developed and nourished in no acute distress HENT normal Neck supple with JVP 8 Device pocket well healed; without hematoma or erythema.  There is no tethering  Clear Regular rate and rhythm, no murmurs or gallops Abd-soft with active BS No Clubbing cyanosis 1+ edema Skin-warm and dry A & Oriented  Grossly normal sensory and motor function    ECG apacing 63 22/09/44  Assessment and  Plan  VT-NS  Atrial fib  VVR  Sinus bradycardia  Morbidly obese  OSA treated  HFpEF  SVT  Volume overload is modest.  Have encouraged him to decrease his sodium intake.  If the edema persists, he is to let us know we can adjust his diuretics.  With his recurrent episodes of SVT, short RP,  wonder about AVNRT, we will increase his metoprolol from 50-75  No afib  On Anticoagulation;  No bleeding issues    No intercurrent Ventricular tachycardia        Current medicines are reviewed at length with the patient today .  The patient does not  have concerns regarding medicines.

## 2017-09-29 LAB — BASIC METABOLIC PANEL
BUN / CREAT RATIO: 13 (ref 10–24)
BUN: 14 mg/dL (ref 8–27)
CALCIUM: 9.4 mg/dL (ref 8.6–10.2)
CHLORIDE: 103 mmol/L (ref 96–106)
CO2: 28 mmol/L (ref 20–29)
Creatinine, Ser: 1.06 mg/dL (ref 0.76–1.27)
GFR calc non Af Amer: 63 mL/min/{1.73_m2} (ref >59)
GFR, EST AFRICAN AMERICAN: 73 mL/min/{1.73_m2} (ref >59)
GLUCOSE: 81 mg/dL (ref 65–99)
POTASSIUM: 4.5 mmol/L (ref 3.5–5.2)
Sodium: 141 mmol/L (ref 134–144)

## 2017-09-29 LAB — MAGNESIUM: MAGNESIUM: 2.3 mg/dL (ref 1.6–2.3)

## 2017-11-03 DIAGNOSIS — H5213 Myopia, bilateral: Secondary | ICD-10-CM | POA: Diagnosis not present

## 2017-11-08 ENCOUNTER — Ambulatory Visit (INDEPENDENT_AMBULATORY_CARE_PROVIDER_SITE_OTHER): Payer: Medicare Other | Admitting: *Deleted

## 2017-11-08 DIAGNOSIS — I4819 Other persistent atrial fibrillation: Secondary | ICD-10-CM

## 2017-11-08 DIAGNOSIS — I481 Persistent atrial fibrillation: Secondary | ICD-10-CM

## 2017-11-08 DIAGNOSIS — Z5181 Encounter for therapeutic drug level monitoring: Secondary | ICD-10-CM

## 2017-11-08 LAB — POCT INR: INR: 2.6

## 2017-11-24 DIAGNOSIS — Z23 Encounter for immunization: Secondary | ICD-10-CM | POA: Diagnosis not present

## 2017-12-11 DIAGNOSIS — J22 Unspecified acute lower respiratory infection: Secondary | ICD-10-CM | POA: Diagnosis not present

## 2017-12-11 DIAGNOSIS — Z6839 Body mass index (BMI) 39.0-39.9, adult: Secondary | ICD-10-CM | POA: Diagnosis not present

## 2017-12-11 DIAGNOSIS — Z1389 Encounter for screening for other disorder: Secondary | ICD-10-CM | POA: Diagnosis not present

## 2017-12-11 DIAGNOSIS — E119 Type 2 diabetes mellitus without complications: Secondary | ICD-10-CM | POA: Diagnosis not present

## 2017-12-17 ENCOUNTER — Other Ambulatory Visit: Payer: Self-pay | Admitting: Cardiology

## 2017-12-20 ENCOUNTER — Ambulatory Visit (INDEPENDENT_AMBULATORY_CARE_PROVIDER_SITE_OTHER): Payer: Medicare Other | Admitting: *Deleted

## 2017-12-20 DIAGNOSIS — I481 Persistent atrial fibrillation: Secondary | ICD-10-CM | POA: Diagnosis not present

## 2017-12-20 DIAGNOSIS — Z5181 Encounter for therapeutic drug level monitoring: Secondary | ICD-10-CM | POA: Diagnosis not present

## 2017-12-20 DIAGNOSIS — I4819 Other persistent atrial fibrillation: Secondary | ICD-10-CM

## 2017-12-20 LAB — POCT INR: INR: 2.6

## 2017-12-20 NOTE — Patient Instructions (Signed)
Continue coumadin 1/2 tablet (2.5mg ) daily except 1 tablet (5mg ) on Mondays and Fridays  Recheck INR in 6 wks

## 2017-12-28 ENCOUNTER — Ambulatory Visit (INDEPENDENT_AMBULATORY_CARE_PROVIDER_SITE_OTHER): Payer: Medicare Other | Admitting: *Deleted

## 2017-12-28 ENCOUNTER — Telehealth: Payer: Self-pay | Admitting: Cardiology

## 2017-12-28 DIAGNOSIS — I495 Sick sinus syndrome: Secondary | ICD-10-CM

## 2017-12-28 NOTE — Telephone Encounter (Signed)
Spoke with pt and reminded pt of remote transmission that is due today. Pt verbalized understanding.   

## 2017-12-28 NOTE — Progress Notes (Signed)
Remote pacemaker transmission.   

## 2018-01-03 ENCOUNTER — Encounter: Payer: Self-pay | Admitting: Cardiology

## 2018-01-07 NOTE — Progress Notes (Signed)
Cardiology Office Note   Date:  01/10/2018   ID:  Chad [name], DOB 05/22/1929, MRN [id number]  PCP:  [name], MD  Cardiologist:   No primary care provider on file.   Chief Complaint  Patient presents with  . Atrial Fibrillation      History of Present Illness: Chad [name] is a 84 y.o. male who presents for follow up of atrial fib.   Since I last saw him he received a pacemaker for treatment of tachybrady syndrome.  He was seen by Dr. Caryl Comes most recently and has his beta blocker increased secondary to SVT.     Since I last saw him he has done relatively well.  He is much better than before he had the pacemaker.  He can still feel his heart go fast at times but this is not sustained.  If he becomes excited or has he does some relaxation techniques and his heart rate will slow down.  He has chronic fatigue and some DOE.  However, this is at baseline and he has no acute complaints.       Past Medical History:  Diagnosis Date  . Bladder cancer ([name]) 2010  . Chronic bronchitis ([place])   . Hypertension   . Hypoglycemia   . On home oxygen therapy    "2L w/CPAP at night" (06/21/2017)  . OSA on CPAP    "w/2L O2 at night" (06/21/2017)  . Persistent atrial fibrillation ([name])       . Pneumonia    "when he was young; again in 2016" (06/21/2017)  . Presence of permanent cardiac pacemaker   . Sensory ataxia     Past Surgical History:  Procedure Laterality Date  . BACK SURGERY    . CARDIOVERSION N/A 04/23/2015   Procedure: CARDIOVERSION;  Surgeon: [name], MD;  Location: [facility];  Service: Cardiovascular;  Laterality: N/A;  . CARDIOVERSION N/A 07/30/2015   Procedure: CARDIOVERSION;  Surgeon: [name], MD;  Location: [facility] ENDOSCOPY;  Service: Cardiovascular;  Laterality: N/A;  . EYE SURGERY Bilateral 2009   "to lower pressure; no glaucoma" (06/21/2017)  . INSERT / REPLACE / REMOVE PACEMAKER  06/21/2017  . [place]  . PACEMAKER IMPLANT N/A 06/21/2017   Procedure: Pacemaker Implant;  Surgeon: Deboraha Sprang, MD;  Location: [place] CV LAB;  Service: Cardiovascular;  Laterality: N/A;  . TRANSURETHRAL RESECTION OF BLADDER TUMOR WITH GYRUS (TURBT-GYRUS)  07/10/2009    Cystourethroscopy, gyrus TURBT (transurethral  resection of bladder tumor).[name] 03/16/2011     Current Outpatient Medications  Medication Sig Dispense Refill  . Calcium-Magnesium 500-250 MG TABS Take 1 tablet by mouth daily.      . Cholecalciferol (VITAMIN D3) 2000 units capsule Take 2,000 Units by mouth 2 (two) times daily.    . Coenzyme Q10 (EQL COQ10) 100 MG capsule Take 200 mg by mouth daily.     Marland Kitchen dofetilide (TIKOSYN) 250 MCG capsule TAKE ONE CAPSULE BY MOUTH TWICE DAILY 180 capsule 2  . escitalopram (LEXAPRO) 10 MG tablet Take 10 mg by mouth at bedtime.     . furosemide (LASIX) 20 MG tablet Take 20 mg by mouth daily as needed for fluid or edema.    . irbesartan (AVAPRO) 300 MG tablet TAKE ONE TABLET BY MOUTH ONCE DAILY 30 tablet 9  . metoprolol succinate (TOPROL-XL) 50 MG 24 hr tablet Take 1 tablet by mouth in the AM and 0.5 tablet by mouth in the PM Take  with or immediately following a meal. 135 tablet 2  . Multiple Vitamins-Minerals (CENTRUM SILVER PO) Take 1 tablet by mouth daily.      . OXYGEN Inhale 2 L into the lungs at bedtime. Uses with CPAP machine    . vitamin B-12 (CYANOCOBALAMIN) 500 MCG tablet Take 500 mcg by mouth 2 (two) times daily.    Marland Kitchen warfarin (COUMADIN) 5 MG tablet TAKE 1/2 TO 1 (ONE-HALF TO ONE) TABLET BY MOUTH ONCE DAILY AS DIRECTED BY  COUMADIN  CLINIC 90 tablet 1   No current facility-administered medications for this visit.     Allergies:   Carbapenems; Cephalosporins; Chlorpheniramine; Ciprofloxacin; Decongestant [oxymetazoline]; Diltiazem; Flagyl [metronidazole]; Hctz [hydrochlorothiazide]; Levaquin [levofloxacin in d5w]; Levofloxacin; Lidocaine; Penicillins; Teline [tetracycline]; Tetanus toxoid; Z-pak [azithromycin]; Sulfonamide derivatives;  and Toprol xl [metoprolol tartrate]    ROS:  Please see the history of present illness.   Otherwise, review of systems are positive for none.   All other systems are reviewed and negative.    PHYSICAL EXAM: VS:  BP 110/70   Pulse 61   Ht 5\' 9"  (1.753 m)   Wt 264 lb (119.7 kg)   BMI 38.99 kg/m  , BMI Body mass index is 38.99 kg/m. GENERAL:  Well appearing NECK:  No jugular venous distention, waveform within normal limits, carotid upstroke brisk and symmetric, no bruits, no thyromegaly LYMPHATICS:  No cervical, inguinal adenopathy LUNGS:  Clear to auscultation bilaterally CHEST:  Well healed pacemaker scar.  HEART:  PMI not displaced or sustained,S1 and S2 within normal limits, no S3, no S4, no clicks, no rubs, no murmurs ABD:  Flat, positive bowel sounds normal in frequency in pitch, no bruits, no rebound, no guarding, no midline pulsatile mass, no hepatomegaly, no splenomegaly, obese EXT:  2 plus pulses throughout, no edema, no cyanosis no clubbing    Recent Labs: 06/12/2017: Hemoglobin 15.2; Platelets 216 09/28/2017: BUN 14; Creatinine, Ser 1.06; Magnesium 2.3; Potassium 4.5; Sodium 141    Lipid Panel    Component Value Date/Time   CHOL 179 06/10/2010 1617   TRIG 75 06/10/2010 1617   HDL 46 06/10/2010 1617   LDLCALC 118 06/10/2010 1617      Wt Readings from Last 3 Encounters:  01/09/18 264 lb (119.7 kg)  09/28/17 268 lb 3.2 oz (121.7 kg)  06/22/17 266 lb 12.1 oz (121 kg)      Other studies Reviewed: Additional studies/ records that were reviewed today include: EP records. Review of the above records demonstrates:  Please see elsewhere in the note.     ASSESSMENT AND PLAN:   ATRIAL FIB:  Mr. [name] has a CHA2DS2 - VASc score of 3.  He tolerates anticoagulation.  He has no symptomatic sustained arrhythmias.  Given this no further testing is indicated.  I will continue current therapy.    HTN:  The blood pressure is at target. No change in medications is  indicated. We will continue with therapeutic lifestyle changes (TLC).  HFpEF:  He seems to be euvolemic.  No change in therapy.   SVT:   He has not had symptomatic sustained episodes.  We will remain on the same meds.     Current medicines are reviewed at length with the patient today.  The patient does not have concerns regarding medicines.  The following changes have been made:  no change  Labs/ tests ordered today include: None No orders of the defined types were placed in this encounter.    Disposition:   FU  with me in one year.     Signed, Minus Breeding, MD  01/10/2018 10:57 AM    [facility]

## 2018-01-09 ENCOUNTER — Encounter: Payer: Self-pay | Admitting: Cardiology

## 2018-01-09 ENCOUNTER — Ambulatory Visit: Payer: Medicare Other | Admitting: Cardiology

## 2018-01-09 VITALS — BP 110/70 | HR 61 | Ht 69.0 in | Wt 264.0 lb

## 2018-01-09 DIAGNOSIS — I495 Sick sinus syndrome: Secondary | ICD-10-CM | POA: Diagnosis not present

## 2018-01-09 DIAGNOSIS — I480 Paroxysmal atrial fibrillation: Secondary | ICD-10-CM | POA: Diagnosis not present

## 2018-01-09 NOTE — Patient Instructions (Signed)
Medication Instructions:  Continue current medications  If you need a refill on your cardiac medications before your next appointment, please call your pharmacy.  Labwork: None Ordered   Testing/Procedures: None ordered  Follow-Up: Your physician wants you to follow-up in: 1 Year. You should receive a reminder letter in the mail two months in advance. If you do not receive a letter, please call our office [phone].     Thank you for choosing [facility]!!       

## 2018-01-10 ENCOUNTER — Encounter: Payer: Self-pay | Admitting: Cardiology

## 2018-01-12 LAB — CUP PACEART REMOTE DEVICE CHECK
Battery Remaining Longevity: 110 mo
Battery Remaining Percentage: 95.5 %
Brady Statistic AP VS Percent: 62 %
Brady Statistic AS VP Percent: 1.2 %
Brady Statistic AS VS Percent: 29 %
Brady Statistic RV Percent Paced: 7.1 %
Implantable Lead Implant Date: 20180808
Implantable Lead Location: 753859
Implantable Lead Model: 5076
Implantable Lead Model: 5076
Lead Channel Impedance Value: 460 Ohm
Lead Channel Pacing Threshold Amplitude: 0.5 V
Lead Channel Pacing Threshold Pulse Width: 0.5 ms
Lead Channel Sensing Intrinsic Amplitude: 12 mV
Lead Channel Sensing Intrinsic Amplitude: 4 mV
Lead Channel Setting Pacing Amplitude: 2 V
Lead Channel Setting Pacing Amplitude: 2.5 V
Lead Channel Setting Pacing Pulse Width: 0.5 ms
Lead Channel Setting Sensing Sensitivity: 2 mV
MDC IDC LEAD IMPLANT DT: 20180808
MDC IDC LEAD LOCATION: 753860
MDC IDC MSMT BATTERY VOLTAGE: 3.01 V
MDC IDC MSMT LEADCHNL RA PACING THRESHOLD AMPLITUDE: 0.75 V
MDC IDC MSMT LEADCHNL RA PACING THRESHOLD PULSEWIDTH: 0.5 ms
MDC IDC MSMT LEADCHNL RV IMPEDANCE VALUE: 490 Ohm
MDC IDC PG IMPLANT DT: 20180808
MDC IDC PG SERIAL: 8932564
MDC IDC SESS DTM: 20190214184653
MDC IDC STAT BRADY AP VP PERCENT: 5.9 %
MDC IDC STAT BRADY RA PERCENT PACED: 63 %

## 2018-01-26 DIAGNOSIS — Z8551 Personal history of malignant neoplasm of bladder: Secondary | ICD-10-CM | POA: Diagnosis not present

## 2018-01-26 DIAGNOSIS — D074 Carcinoma in situ of penis: Secondary | ICD-10-CM | POA: Diagnosis not present

## 2018-01-30 DIAGNOSIS — L309 Dermatitis, unspecified: Secondary | ICD-10-CM | POA: Diagnosis not present

## 2018-01-30 DIAGNOSIS — L439 Lichen planus, unspecified: Secondary | ICD-10-CM | POA: Diagnosis not present

## 2018-01-31 ENCOUNTER — Ambulatory Visit (INDEPENDENT_AMBULATORY_CARE_PROVIDER_SITE_OTHER): Payer: Medicare Other | Admitting: *Deleted

## 2018-01-31 DIAGNOSIS — I4819 Other persistent atrial fibrillation: Secondary | ICD-10-CM

## 2018-01-31 DIAGNOSIS — Z5181 Encounter for therapeutic drug level monitoring: Secondary | ICD-10-CM

## 2018-01-31 DIAGNOSIS — I481 Persistent atrial fibrillation: Secondary | ICD-10-CM | POA: Diagnosis not present

## 2018-01-31 LAB — POCT INR: INR: 2.2

## 2018-01-31 NOTE — Patient Instructions (Signed)
Continue coumadin 1/2 tablet (2.5mg ) daily except 1 tablet (5mg ) on Mondays and Fridays  Recheck INR in 6 wks

## 2018-03-21 ENCOUNTER — Ambulatory Visit (INDEPENDENT_AMBULATORY_CARE_PROVIDER_SITE_OTHER): Payer: Medicare Other | Admitting: *Deleted

## 2018-03-21 DIAGNOSIS — I481 Persistent atrial fibrillation: Secondary | ICD-10-CM | POA: Diagnosis not present

## 2018-03-21 DIAGNOSIS — Z5181 Encounter for therapeutic drug level monitoring: Secondary | ICD-10-CM

## 2018-03-21 DIAGNOSIS — I4819 Other persistent atrial fibrillation: Secondary | ICD-10-CM

## 2018-03-21 LAB — POCT INR: INR: 2.8

## 2018-03-21 NOTE — Patient Instructions (Signed)
Continue coumadin 1/2 tablet (2.5mg ) daily except 1 tablet (5mg ) on Mondays and Fridays  Recheck INR in 6 wks

## 2018-03-29 ENCOUNTER — Ambulatory Visit (INDEPENDENT_AMBULATORY_CARE_PROVIDER_SITE_OTHER): Payer: Medicare Other | Admitting: *Deleted

## 2018-03-29 DIAGNOSIS — R001 Bradycardia, unspecified: Secondary | ICD-10-CM | POA: Diagnosis not present

## 2018-03-29 NOTE — Progress Notes (Signed)
Remote pacemaker transmission.   

## 2018-03-30 ENCOUNTER — Encounter: Payer: Self-pay | Admitting: Cardiology

## 2018-04-10 LAB — CUP PACEART REMOTE DEVICE CHECK
Battery Remaining Longevity: 110 mo
Battery Remaining Percentage: 95.5 %
Battery Voltage: 3.01 V
Brady Statistic AS VP Percent: 1 %
Brady Statistic RA Percent Paced: 62 %
Implantable Lead Location: 753860
Implantable Lead Model: 5076
Lead Channel Impedance Value: 480 Ohm
Lead Channel Pacing Threshold Amplitude: 0.75 V
Lead Channel Pacing Threshold Pulse Width: 0.5 ms
Lead Channel Sensing Intrinsic Amplitude: 12 mV
Lead Channel Sensing Intrinsic Amplitude: 3.4 mV
Lead Channel Setting Pacing Amplitude: 2 V
Lead Channel Setting Sensing Sensitivity: 2 mV
MDC IDC LEAD IMPLANT DT: 20180808
MDC IDC LEAD IMPLANT DT: 20180808
MDC IDC LEAD LOCATION: 753859
MDC IDC MSMT LEADCHNL RV IMPEDANCE VALUE: 490 Ohm
MDC IDC MSMT LEADCHNL RV PACING THRESHOLD AMPLITUDE: 0.5 V
MDC IDC MSMT LEADCHNL RV PACING THRESHOLD PULSEWIDTH: 0.5 ms
MDC IDC PG IMPLANT DT: 20180808
MDC IDC PG SERIAL: 8932564
MDC IDC SESS DTM: 20190516113028
MDC IDC SET LEADCHNL RV PACING AMPLITUDE: 2.5 V
MDC IDC SET LEADCHNL RV PACING PULSEWIDTH: 0.5 ms
MDC IDC STAT BRADY AP VP PERCENT: 4.1 %
MDC IDC STAT BRADY AP VS PERCENT: 63 %
MDC IDC STAT BRADY AS VS PERCENT: 31 %
MDC IDC STAT BRADY RV PERCENT PACED: 5.1 %

## 2018-04-24 ENCOUNTER — Other Ambulatory Visit: Payer: Self-pay | Admitting: Cardiology

## 2018-04-24 ENCOUNTER — Other Ambulatory Visit: Payer: Self-pay | Admitting: Internal Medicine

## 2018-04-25 MED ORDER — FUROSEMIDE 20 MG PO TABS: ORAL_TABLET | ORAL | 1 refills | Status: DC

## 2018-04-25 MED ORDER — FUROSEMIDE 20 MG PO TABS
ORAL_TABLET | ORAL | 1 refills | Status: DC
Start: 2018-04-25 — End: 2018-04-25

## 2018-04-25 NOTE — Addendum Note (Signed)
Addended by: Derl Barrow on: 04/25/2018 08:09 AM   Modules accepted: Orders

## 2018-04-25 NOTE — Addendum Note (Signed)
Addended by: Derl Barrow on: 04/25/2018 01:35 PM   Modules accepted: Orders

## 2018-05-01 ENCOUNTER — Other Ambulatory Visit: Payer: Self-pay | Admitting: Pharmacist Clinician (PhC)/ Clinical Pharmacy Specialist

## 2018-05-01 MED ORDER — WARFARIN SODIUM 5 MG PO TABS: ORAL_TABLET | ORAL | 1 refills | Status: DC

## 2018-05-02 ENCOUNTER — Ambulatory Visit (INDEPENDENT_AMBULATORY_CARE_PROVIDER_SITE_OTHER): Payer: Medicare Other | Admitting: *Deleted

## 2018-05-02 DIAGNOSIS — I481 Persistent atrial fibrillation: Secondary | ICD-10-CM | POA: Diagnosis not present

## 2018-05-02 DIAGNOSIS — I4819 Other persistent atrial fibrillation: Secondary | ICD-10-CM

## 2018-05-02 DIAGNOSIS — Z5181 Encounter for therapeutic drug level monitoring: Secondary | ICD-10-CM

## 2018-05-02 LAB — POCT INR: INR: 3.3 — AB (ref 2.0–3.0)

## 2018-05-02 NOTE — Patient Instructions (Signed)
Hold coumadin tonight then resume 1/2 tablet (2.5mg ) daily except 1 tablet (5mg ) on Mondays and Fridays  Recheck INR in 4 wks

## 2018-05-04 DIAGNOSIS — H2513 Age-related nuclear cataract, bilateral: Secondary | ICD-10-CM | POA: Diagnosis not present

## 2018-05-22 DIAGNOSIS — E1140 Type 2 diabetes mellitus with diabetic neuropathy, unspecified: Secondary | ICD-10-CM | POA: Diagnosis not present

## 2018-05-22 DIAGNOSIS — Z0000 Encounter for general adult medical examination without abnormal findings: Secondary | ICD-10-CM | POA: Diagnosis not present

## 2018-05-22 DIAGNOSIS — Z6841 Body Mass Index (BMI) 40.0 and over, adult: Secondary | ICD-10-CM | POA: Diagnosis not present

## 2018-05-24 ENCOUNTER — Other Ambulatory Visit: Payer: Self-pay | Admitting: Cardiology

## 2018-06-06 ENCOUNTER — Ambulatory Visit (INDEPENDENT_AMBULATORY_CARE_PROVIDER_SITE_OTHER): Payer: Medicare Other | Admitting: *Deleted

## 2018-06-06 DIAGNOSIS — I481 Persistent atrial fibrillation: Secondary | ICD-10-CM | POA: Diagnosis not present

## 2018-06-06 DIAGNOSIS — I4819 Other persistent atrial fibrillation: Secondary | ICD-10-CM

## 2018-06-06 DIAGNOSIS — Z5181 Encounter for therapeutic drug level monitoring: Secondary | ICD-10-CM | POA: Diagnosis not present

## 2018-06-06 LAB — POCT INR: INR: 2.7 (ref 2.0–3.0)

## 2018-06-06 NOTE — Patient Instructions (Signed)
Continue coumadin 1/2 tablet (2.5mg ) daily except 1 tablet (5mg ) on Mondays and Fridays  Recheck INR in 4 wks

## 2018-06-07 DIAGNOSIS — H2511 Age-related nuclear cataract, right eye: Secondary | ICD-10-CM | POA: Diagnosis not present

## 2018-06-07 DIAGNOSIS — H25811 Combined forms of age-related cataract, right eye: Secondary | ICD-10-CM | POA: Diagnosis not present

## 2018-06-07 DIAGNOSIS — H5703 Miosis: Secondary | ICD-10-CM | POA: Diagnosis not present

## 2018-06-25 ENCOUNTER — Other Ambulatory Visit: Payer: Self-pay | Admitting: Cardiology

## 2018-06-28 ENCOUNTER — Telehealth: Payer: Self-pay | Admitting: Cardiology

## 2018-06-28 ENCOUNTER — Ambulatory Visit (INDEPENDENT_AMBULATORY_CARE_PROVIDER_SITE_OTHER): Payer: Medicare Other | Admitting: *Deleted

## 2018-06-28 DIAGNOSIS — I495 Sick sinus syndrome: Secondary | ICD-10-CM

## 2018-06-28 NOTE — Telephone Encounter (Signed)
Confirmed remote transmission w/ pt wife.   

## 2018-06-29 NOTE — Progress Notes (Signed)
Remote pacemaker transmission.   

## 2018-07-04 ENCOUNTER — Ambulatory Visit (INDEPENDENT_AMBULATORY_CARE_PROVIDER_SITE_OTHER): Payer: Medicare Other | Admitting: *Deleted

## 2018-07-04 DIAGNOSIS — Z5181 Encounter for therapeutic drug level monitoring: Secondary | ICD-10-CM

## 2018-07-04 DIAGNOSIS — I481 Persistent atrial fibrillation: Secondary | ICD-10-CM

## 2018-07-04 DIAGNOSIS — I4819 Other persistent atrial fibrillation: Secondary | ICD-10-CM

## 2018-07-04 LAB — POCT INR: INR: 2.9 (ref 2.0–3.0)

## 2018-07-04 NOTE — Patient Instructions (Signed)
Description   Continue coumadin 1/2 tablet (2.5mg ) daily except 1 tablet (5mg ) on Mondays and Fridays  Recheck INR in 4 wks

## 2018-07-05 DIAGNOSIS — H21562 Pupillary abnormality, left eye: Secondary | ICD-10-CM | POA: Diagnosis not present

## 2018-07-05 DIAGNOSIS — H2512 Age-related nuclear cataract, left eye: Secondary | ICD-10-CM | POA: Diagnosis not present

## 2018-07-05 DIAGNOSIS — H25812 Combined forms of age-related cataract, left eye: Secondary | ICD-10-CM | POA: Diagnosis not present

## 2018-07-11 ENCOUNTER — Other Ambulatory Visit: Payer: Self-pay | Admitting: Internal Medicine

## 2018-07-25 ENCOUNTER — Ambulatory Visit (INDEPENDENT_AMBULATORY_CARE_PROVIDER_SITE_OTHER): Payer: Medicare Other | Admitting: Internal Medicine

## 2018-07-25 ENCOUNTER — Encounter: Payer: Self-pay | Admitting: Internal Medicine

## 2018-07-25 VITALS — BP 120/70 | HR 70 | Ht 69.0 in | Wt 269.6 lb

## 2018-07-25 DIAGNOSIS — I4819 Other persistent atrial fibrillation: Secondary | ICD-10-CM

## 2018-07-25 DIAGNOSIS — R001 Bradycardia, unspecified: Secondary | ICD-10-CM

## 2018-07-25 DIAGNOSIS — Z950 Presence of cardiac pacemaker: Secondary | ICD-10-CM | POA: Diagnosis not present

## 2018-07-25 DIAGNOSIS — I481 Persistent atrial fibrillation: Secondary | ICD-10-CM | POA: Diagnosis not present

## 2018-07-25 DIAGNOSIS — I495 Sick sinus syndrome: Secondary | ICD-10-CM | POA: Diagnosis not present

## 2018-07-25 MED ORDER — METOPROLOL SUCCINATE ER 50 MG PO TB24: 100.0000 mg | ORAL_TABLET | Freq: Two times a day (BID) | ORAL | 0 refills | Status: DC

## 2018-07-25 MED ORDER — IRBESARTAN 300 MG PO TABS: 150.0000 mg | ORAL_TABLET | Freq: Every day | ORAL | 2 refills | Status: DC

## 2018-07-25 NOTE — Patient Instructions (Addendum)
Medication Instructions:  Your physician has recommended you make the following change in your medication:   1. Decrease your Irbasartan to 150mg , half tablet, once per day 2. Increase your Toprol XL to 100mg , two tablets in the AM, 50mg , one tablet in the PM  Labwork: You will have labs drawn today: CBC, BMP, Mg   Testing/Procedures: None ordered.  Follow-Up: Your physician wants you to follow-up in: One Year with Dr Caryl Comes. You will receive a reminder letter in the mail two months in advance. If you don't receive a letter, please call our office to schedule the follow-up appointment.  Remote monitoring is used to monitor your Pacemaker from home. This monitoring reduces the number of office visits required to check your device to one time per year. It allows [place] to keep an eye on the functioning of your device to ensure it is working properly. You are scheduled for a device check from home on 09/27/2018. You may send your transmission at any time that day. If you have a wireless device, the transmission will be sent automatically. After your physician reviews your transmission, you will receive a postcard with your next transmission date.    Any Other Special Instructions Will Be Listed Below (If Applicable).     If you need a refill on your cardiac medications before your next appointment, please call your pharmacy.

## 2018-07-25 NOTE — Progress Notes (Signed)
Patient Care Team: Chad Sites, MD as PCP - General (Family Medicine)   HPI  Chad [name] is a 84 y.o. male Seen in follow-up for pacing undertaken 2018 for sinus node dysfunction.  He also has atrial fibrillation nonsustained ventricular tachycardia; anticoagulated with warfarin.  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  DATE TEST EF   1/15 Echo   60-65 %           [name] and Results Reviewed labs from ER vsist  Date Cr K Mg Hgb  7/18  1.16 4.5 2.2 15.2  11/18  1.06 4.5 2.3    No  Syncope.  Imbalance persists which is related to neuropathy.  There are also now issues of motor neuropathy. Denies chest [name] except when episodes of tachycardia.  Is able to terminate these within a couple of minutes.  They are relatively infrequent.  He is Past Medical History:  Diagnosis Date  . Bladder cancer ([place]) 2010  . Chronic bronchitis ([place])   . Hypertension   . Hypoglycemia   . On home oxygen therapy    "2L w/CPAP at night" (06/21/2017)  . OSA on CPAP    "w/2L O2 at night" (06/21/2017)  . Persistent atrial fibrillation ([place])       . Pneumonia    "when he was young; again in 2016" (06/21/2017)  . Presence of permanent cardiac pacemaker   . Sensory ataxia     Past Surgical History:  Procedure Laterality Date  . BACK SURGERY    . CARDIOVERSION N/A 04/23/2015   Procedure: CARDIOVERSION;  Surgeon: Chad Records, MD;  Location: [facility];  Service: Cardiovascular;  Laterality: N/A;  . CARDIOVERSION N/A 07/30/2015   Procedure: CARDIOVERSION;  Surgeon: Chad Pain, MD;  Location: [facility] ENDOSCOPY;  Service: Cardiovascular;  Laterality: N/A;  . EYE SURGERY Bilateral 2009   "to lower pressure; no glaucoma" (06/21/2017)  . INSERT / REPLACE / REMOVE PACEMAKER  06/21/2017  . [place]  . PACEMAKER IMPLANT N/A 06/21/2017   Procedure: Pacemaker Implant;  Surgeon: Chad Sprang, MD;  Location: [facility] CV LAB;  Service: Cardiovascular;   Laterality: N/A;  . TRANSURETHRAL RESECTION OF BLADDER TUMOR WITH GYRUS (TURBT-GYRUS)  07/10/2009    Cystourethroscopy, gyrus TURBT (transurethral  resection of bladder tumor).Chad [name] 03/16/2011    Current Outpatient Medications  Medication Sig Dispense Refill  . Calcium-Magnesium 500-250 MG TABS Take 1 tablet by mouth daily.      . Cholecalciferol (VITAMIN D3) 2000 units capsule Take 2,000 Units by mouth 2 (two) times daily.    . Coenzyme Q10 (EQL COQ10) 100 MG capsule Take 200 mg by mouth daily.     Marland Kitchen dofetilide (TIKOSYN) 250 MCG capsule TAKE 1 CAPSULE BY MOUTH TWICE DAILY 180 capsule 2  . escitalopram (LEXAPRO) 10 MG tablet Take 10 mg by mouth at bedtime.     . furosemide (LASIX) 20 MG tablet Take 1 tablet by mouth daily as needed for fluid or edema. Please make yearly appt with Chad [name] for November. 1st attempt 90 tablet 1  . irbesartan (AVAPRO) 300 MG tablet TAKE 1 TABLET BY MOUTH ONCE DAILY 90 tablet 2  . metoprolol succinate (TOPROL-XL) 50 MG 24 hr tablet TAKE 1 TABLET BY MOUTH IN THE MORNING AND 1/2 (ONE-HALF) IN THE EVENING WITH  OR  IMMEDIATELY  FOLLOWING  A  MEAL please keep appt. Thanks 135 tablet 0  . Multiple Vitamins-Minerals (CENTRUM  SILVER PO) Take 1 tablet by mouth daily.      . OXYGEN Inhale 2 L into the lungs at bedtime. Uses with CPAP machine    . vitamin B-12 (CYANOCOBALAMIN) 500 MCG tablet Take 500 mcg by mouth 2 (two) times daily.    Marland Kitchen warfarin (COUMADIN) 5 MG tablet TAKE 1/2 TO 1 (ONE-HALF TO ONE) TABLET BY MOUTH ONCE DAILY AS DIRECTED BY  COUMADIN  CLINIC 90 tablet 1   No current facility-administered medications for this visit.     Allergies  Allergen Reactions  . Carbapenems   . Cephalosporins Other (See Comments)  . Chlorpheniramine Other (See Comments)    Swells prostate  . Ciprofloxacin     Pt can not take with amiodarone   . Decongestant [Oxymetazoline]     All decongestant - makes prostate swell  . Diltiazem Swelling    Left leg swelling  . Flagyl  [Metronidazole] Nausea Only    Stomach ache  . Hctz [Hydrochlorothiazide]   . Levaquin [Levofloxacin In D5w] Other (See Comments)    unknown  . Levofloxacin Nausea Only  . Lidocaine     Pt unsure of  . Penicillins Swelling    Has patient had a PCN reaction causing immediate rash, facial/tongue/throat swelling, SOB or lightheadedness with hypotension: No Has patient had a PCN reaction causing severe rash involving mucus membranes or skin necrosis: No Has patient had a PCN reaction that required hospitalization: No Has patient had a PCN reaction occurring within the last 10 years: No If all of the above answers are "NO", then may proceed with Cephalosporin use. Swelling at injection site  . Teline [Tetracycline] Swelling    Hospitalization for 2 weeks, prostate swelling  . Tetanus Toxoid Swelling    Swells arms, Please pre-medicate with benadryl.  Marland Kitchen Z-Pak [Azithromycin] Other (See Comments)    Pt can not take with Amiodarone   . Sulfonamide Derivatives Rash  . Toprol Xl [Metoprolol Tartrate] Itching and Rash    Pt states he tolerates.      Review of Systems negative except from HPI and PMH  Physical Exam BP 120/70   Pulse 70   Ht 5\' 9"  (1.753 m)   Wt 269 lb 9.6 oz (122.3 kg)   SpO2 94%   BMI 39.81 kg/m  Well developed and nourished in no acute distress HENT normal Neck supple with JVP-flat Clear Regular rate and rhythm, no murmurs or gallops Abd-soft with active BS No Clubbing cyanosis edema Skin-warm and dry A & Oriented  Grossly normal sensory and motor function    ECG Apacing wth some PACs 70 Repol 22/13/42   Assessment and  Plan  VT-NS  Atrial fib  RVR  Sinus bradycardia  Morbidly obese  OSA treated  HFpEF  SVT  Neuropathy  Euvolemic continue current meds  He is most limited by his neuropathy  He has recurrent relatively brief episodes of SVT which are symptomatic.  We will increase his metoprolol from 75--150 daily and decrease his  irbesartan from 300--150 daily.        Current medicines are reviewed at length with the patient today .  The patient does not  have concerns regarding medicines.

## 2018-07-26 LAB — CBC
HEMOGLOBIN: 15.5 g/dL (ref 13.0–17.7)
Hematocrit: 45.2 % (ref 37.5–51.0)
MCH: 30.2 pg (ref 26.6–33.0)
MCHC: 34.3 g/dL (ref 31.5–35.7)
MCV: 88 fL (ref 79–97)
PLATELETS: 194 10*3/uL (ref 150–450)
RBC: 5.13 x10E6/uL (ref 4.14–5.80)
RDW: 14.7 % (ref 12.3–15.4)
WBC: 7.9 10*3/uL (ref 3.4–10.8)

## 2018-07-26 LAB — BASIC METABOLIC PANEL
BUN/Creatinine Ratio: 15 (ref 10–24)
BUN: 16 mg/dL (ref 8–27)
CO2: 26 mmol/L (ref 20–29)
CREATININE: 1.09 mg/dL (ref 0.76–1.27)
Calcium: 9.2 mg/dL (ref 8.6–10.2)
Chloride: 101 mmol/L (ref 96–106)
GFR calc non Af Amer: 61 mL/min/{1.73_m2} (ref >59)
GFR, EST AFRICAN AMERICAN: 70 mL/min/{1.73_m2} (ref >59)
Glucose: 110 mg/dL — ABNORMAL HIGH (ref 65–99)
POTASSIUM: 4.3 mmol/L (ref 3.5–5.2)
SODIUM: 141 mmol/L (ref 134–144)

## 2018-07-26 LAB — MAGNESIUM: MAGNESIUM: 2.4 mg/dL — AB (ref 1.6–2.3)

## 2018-07-30 LAB — CUP PACEART REMOTE DEVICE CHECK
Battery Remaining Longevity: 107 mo
Brady Statistic AP VP Percent: 4.2 %
Brady Statistic AS VP Percent: 1 %
Brady Statistic AS VS Percent: 30 %
Brady Statistic RA Percent Paced: 63 %
Brady Statistic RV Percent Paced: 5.1 %
Date Time Interrogation Session: 20190816140652
Implantable Lead Location: 753859
Implantable Lead Location: 753860
Implantable Lead Model: 5076
Implantable Lead Model: 5076
Lead Channel Impedance Value: 480 Ohm
Lead Channel Pacing Threshold Pulse Width: 0.5 ms
Lead Channel Sensing Intrinsic Amplitude: 12 mV
Lead Channel Setting Pacing Amplitude: 2 V
Lead Channel Setting Sensing Sensitivity: 2 mV
MDC IDC LEAD IMPLANT DT: 20180808
MDC IDC LEAD IMPLANT DT: 20180808
MDC IDC MSMT BATTERY REMAINING PERCENTAGE: 95 %
MDC IDC MSMT BATTERY VOLTAGE: 3.01 V
MDC IDC MSMT LEADCHNL RA IMPEDANCE VALUE: 460 Ohm
MDC IDC MSMT LEADCHNL RA PACING THRESHOLD AMPLITUDE: 0.75 V
MDC IDC MSMT LEADCHNL RA SENSING INTR AMPL: 3 mV
MDC IDC MSMT LEADCHNL RV PACING THRESHOLD AMPLITUDE: 0.5 V
MDC IDC MSMT LEADCHNL RV PACING THRESHOLD PULSEWIDTH: 0.5 ms
MDC IDC PG IMPLANT DT: 20180808
MDC IDC SET LEADCHNL RV PACING AMPLITUDE: 2.5 V
MDC IDC SET LEADCHNL RV PACING PULSEWIDTH: 0.5 ms
MDC IDC STAT BRADY AP VS PERCENT: 64 %
Pulse Gen Model: 2272
Pulse Gen Serial Number: 8932564

## 2018-07-31 MED ORDER — METOPROLOL SUCCINATE ER 50 MG PO TB24: ORAL_TABLET | ORAL | 3 refills | Status: DC

## 2018-07-31 NOTE — Addendum Note (Signed)
Addended by: Dollene Primrose on: 07/31/2018 03:11 PM   Modules accepted: Orders

## 2018-08-01 ENCOUNTER — Ambulatory Visit (INDEPENDENT_AMBULATORY_CARE_PROVIDER_SITE_OTHER): Payer: Medicare Other | Admitting: *Deleted

## 2018-08-01 DIAGNOSIS — Z5181 Encounter for therapeutic drug level monitoring: Secondary | ICD-10-CM

## 2018-08-01 DIAGNOSIS — I481 Persistent atrial fibrillation: Secondary | ICD-10-CM | POA: Diagnosis not present

## 2018-08-01 DIAGNOSIS — I4819 Other persistent atrial fibrillation: Secondary | ICD-10-CM

## 2018-08-01 LAB — CUP PACEART INCLINIC DEVICE CHECK
Battery Voltage: 3.01 V
Brady Statistic RA Percent Paced: 64 %
Date Time Interrogation Session: 20190911170910
Implantable Lead Implant Date: 20180808
Implantable Lead Location: 753860
Implantable Lead Model: 5076
Implantable Lead Model: 5076
Implantable Pulse Generator Implant Date: 20180808
Lead Channel Impedance Value: 475 Ohm
Lead Channel Impedance Value: 475 Ohm
Lead Channel Pacing Threshold Pulse Width: 0.5 ms
Lead Channel Sensing Intrinsic Amplitude: 3.3 mV
MDC IDC LEAD IMPLANT DT: 20180808
MDC IDC LEAD LOCATION: 753859
MDC IDC MSMT BATTERY REMAINING LONGEVITY: 124 mo
MDC IDC MSMT LEADCHNL RA PACING THRESHOLD AMPLITUDE: 0.75 V
MDC IDC MSMT LEADCHNL RV PACING THRESHOLD AMPLITUDE: 0.75 V
MDC IDC MSMT LEADCHNL RV PACING THRESHOLD PULSEWIDTH: 0.5 ms
MDC IDC MSMT LEADCHNL RV SENSING INTR AMPL: 12 mV
MDC IDC SET LEADCHNL RA PACING AMPLITUDE: 2 V
MDC IDC SET LEADCHNL RV PACING AMPLITUDE: 2.5 V
MDC IDC SET LEADCHNL RV PACING PULSEWIDTH: 0.5 ms
MDC IDC SET LEADCHNL RV SENSING SENSITIVITY: 2 mV
MDC IDC STAT BRADY RV PERCENT PACED: 5.8 %
Pulse Gen Serial Number: 8932564

## 2018-08-01 LAB — POCT INR: INR: 3.3 — AB (ref 2.0–3.0)

## 2018-08-01 NOTE — Patient Instructions (Signed)
Had nose bleed at time of visit.  States he has had 5 nose bleeds since Saturday. Hold coumadin tonight then decrease dose to 1/2 tablet (2.5mg ) daily except 1 tablet (5mg ) on Mondays  Recheck INR in 3 wks

## 2018-08-27 ENCOUNTER — Ambulatory Visit (INDEPENDENT_AMBULATORY_CARE_PROVIDER_SITE_OTHER): Payer: Medicare Other | Admitting: *Deleted

## 2018-08-27 DIAGNOSIS — I4819 Other persistent atrial fibrillation: Secondary | ICD-10-CM

## 2018-08-27 DIAGNOSIS — Z5181 Encounter for therapeutic drug level monitoring: Secondary | ICD-10-CM

## 2018-08-27 LAB — POCT INR: INR: 2.3 (ref 2.0–3.0)

## 2018-08-27 NOTE — Patient Instructions (Signed)
Continue coumadin 1/2 tablet (2.5mg ) daily except 1 tablet (5mg ) on Mondays  Recheck INR in 4 wks

## 2018-09-06 DIAGNOSIS — Z23 Encounter for immunization: Secondary | ICD-10-CM | POA: Diagnosis not present

## 2018-09-07 DIAGNOSIS — E1140 Type 2 diabetes mellitus with diabetic neuropathy, unspecified: Secondary | ICD-10-CM | POA: Diagnosis not present

## 2018-09-07 DIAGNOSIS — M5417 Radiculopathy, lumbosacral region: Secondary | ICD-10-CM | POA: Diagnosis not present

## 2018-09-07 DIAGNOSIS — Z6841 Body Mass Index (BMI) 40.0 and over, adult: Secondary | ICD-10-CM | POA: Diagnosis not present

## 2018-09-07 DIAGNOSIS — M47816 Spondylosis without myelopathy or radiculopathy, lumbar region: Secondary | ICD-10-CM | POA: Diagnosis not present

## 2018-09-19 DIAGNOSIS — C670 Malignant neoplasm of trigone of bladder: Secondary | ICD-10-CM | POA: Diagnosis not present

## 2018-09-19 DIAGNOSIS — Z6841 Body Mass Index (BMI) 40.0 and over, adult: Secondary | ICD-10-CM | POA: Diagnosis not present

## 2018-09-19 DIAGNOSIS — M5417 Radiculopathy, lumbosacral region: Secondary | ICD-10-CM | POA: Diagnosis not present

## 2018-09-19 DIAGNOSIS — M1991 Primary osteoarthritis, unspecified site: Secondary | ICD-10-CM | POA: Diagnosis not present

## 2018-09-24 ENCOUNTER — Ambulatory Visit (INDEPENDENT_AMBULATORY_CARE_PROVIDER_SITE_OTHER): Payer: Medicare Other | Admitting: *Deleted

## 2018-09-24 DIAGNOSIS — I4819 Other persistent atrial fibrillation: Secondary | ICD-10-CM | POA: Diagnosis not present

## 2018-09-24 DIAGNOSIS — Z5181 Encounter for therapeutic drug level monitoring: Secondary | ICD-10-CM | POA: Diagnosis not present

## 2018-09-24 LAB — POCT INR: INR: 1.4 — AB (ref 2.0–3.0)

## 2018-09-24 NOTE — Patient Instructions (Signed)
Take coumadin 1 1/2 tablets tonight, take 1 tablet tomorrow night then increase dose to 1/2 tablet (2.5mg ) daily except 1 tablet (5mg ) on Mondays and Fridays Recheck INR in 2 wks

## 2018-09-27 ENCOUNTER — Ambulatory Visit (INDEPENDENT_AMBULATORY_CARE_PROVIDER_SITE_OTHER): Payer: Medicare Other | Admitting: *Deleted

## 2018-09-27 DIAGNOSIS — R001 Bradycardia, unspecified: Secondary | ICD-10-CM

## 2018-09-27 DIAGNOSIS — I495 Sick sinus syndrome: Secondary | ICD-10-CM

## 2018-09-27 NOTE — Progress Notes (Signed)
Remote pacemaker transmission.   

## 2018-10-08 ENCOUNTER — Ambulatory Visit (INDEPENDENT_AMBULATORY_CARE_PROVIDER_SITE_OTHER): Payer: Medicare Other | Admitting: *Deleted

## 2018-10-08 DIAGNOSIS — I4819 Other persistent atrial fibrillation: Secondary | ICD-10-CM | POA: Diagnosis not present

## 2018-10-08 DIAGNOSIS — Z5181 Encounter for therapeutic drug level monitoring: Secondary | ICD-10-CM

## 2018-10-08 LAB — POCT INR: INR: 3 (ref 2.0–3.0)

## 2018-10-08 NOTE — Patient Instructions (Signed)
Continue coumadin 1/2 tablet (2.5mg ) daily except 1 tablet (5mg ) on Mondays and Fridays Recheck INR in 3 weeks

## 2018-10-18 DIAGNOSIS — T1512XA Foreign body in conjunctival sac, left eye, initial encounter: Secondary | ICD-10-CM | POA: Diagnosis not present

## 2018-10-22 ENCOUNTER — Other Ambulatory Visit: Payer: Self-pay | Admitting: Internal Medicine

## 2018-10-29 ENCOUNTER — Ambulatory Visit (INDEPENDENT_AMBULATORY_CARE_PROVIDER_SITE_OTHER): Payer: Medicare Other | Admitting: *Deleted

## 2018-10-29 DIAGNOSIS — Z5181 Encounter for therapeutic drug level monitoring: Secondary | ICD-10-CM | POA: Diagnosis not present

## 2018-10-29 DIAGNOSIS — I4819 Other persistent atrial fibrillation: Secondary | ICD-10-CM | POA: Diagnosis not present

## 2018-10-29 LAB — POCT INR: INR: 2.7 (ref 2.0–3.0)

## 2018-10-29 NOTE — Patient Instructions (Signed)
Continue coumadin 1/2 tablet (2.5mg ) daily except 1 tablet (5mg ) on Mondays and Fridays Recheck INR in 4 weeks

## 2018-11-27 LAB — CUP PACEART REMOTE DEVICE CHECK
Battery Remaining Longevity: 115 mo
Battery Remaining Percentage: 95.5 %
Brady Statistic AP VS Percent: 68 %
Brady Statistic AS VP Percent: 1.2 %
Brady Statistic AS VS Percent: 21 %
Brady Statistic RA Percent Paced: 74 %
Brady Statistic RV Percent Paced: 11 %
Implantable Lead Implant Date: 20180808
Implantable Lead Location: 753859
Implantable Lead Model: 5076
Implantable Pulse Generator Implant Date: 20180808
Lead Channel Impedance Value: 480 Ohm
Lead Channel Pacing Threshold Amplitude: 0.75 V
Lead Channel Pacing Threshold Pulse Width: 0.5 ms
Lead Channel Pacing Threshold Pulse Width: 0.5 ms
Lead Channel Sensing Intrinsic Amplitude: 12 mV
Lead Channel Sensing Intrinsic Amplitude: 3.5 mV
Lead Channel Setting Pacing Amplitude: 2.5 V
Lead Channel Setting Pacing Pulse Width: 0.5 ms
MDC IDC LEAD IMPLANT DT: 20180808
MDC IDC LEAD LOCATION: 753860
MDC IDC MSMT BATTERY VOLTAGE: 3.01 V
MDC IDC MSMT LEADCHNL RA PACING THRESHOLD AMPLITUDE: 0.75 V
MDC IDC MSMT LEADCHNL RV IMPEDANCE VALUE: 480 Ohm
MDC IDC PG SERIAL: 8932564
MDC IDC SESS DTM: 20191114104754
MDC IDC SET LEADCHNL RA PACING AMPLITUDE: 2 V
MDC IDC SET LEADCHNL RV SENSING SENSITIVITY: 2 mV
MDC IDC STAT BRADY AP VP PERCENT: 9.7 %

## 2018-12-17 ENCOUNTER — Ambulatory Visit (INDEPENDENT_AMBULATORY_CARE_PROVIDER_SITE_OTHER): Payer: Medicare Other | Admitting: Pharmacist

## 2018-12-17 DIAGNOSIS — I4819 Other persistent atrial fibrillation: Secondary | ICD-10-CM | POA: Diagnosis not present

## 2018-12-17 DIAGNOSIS — Z5181 Encounter for therapeutic drug level monitoring: Secondary | ICD-10-CM | POA: Diagnosis not present

## 2018-12-17 LAB — POCT INR: INR: 2.4 (ref 2.0–3.0)

## 2018-12-17 NOTE — Patient Instructions (Signed)
Description   Take only 1/2 tablet today (due to dental procedure) then continue coumadin 1/2 tablet (2.5mg ) daily except 1 tablet (5mg ) on Mondays and Fridays Recheck INR in 4 weeks

## 2018-12-27 ENCOUNTER — Ambulatory Visit (INDEPENDENT_AMBULATORY_CARE_PROVIDER_SITE_OTHER): Payer: Medicare Other

## 2018-12-27 DIAGNOSIS — I495 Sick sinus syndrome: Secondary | ICD-10-CM | POA: Diagnosis not present

## 2018-12-30 LAB — CUP PACEART REMOTE DEVICE CHECK
Battery Remaining Longevity: 115 mo
Battery Remaining Percentage: 95.5 %
Battery Voltage: 3.01 V
Brady Statistic AP VS Percent: 74 %
Brady Statistic AS VP Percent: 1 %
Brady Statistic AS VS Percent: 19 %
Brady Statistic RA Percent Paced: 76 %
Brady Statistic RV Percent Paced: 7 %
Implantable Lead Implant Date: 20180808
Implantable Lead Implant Date: 20180808
Implantable Lead Location: 753860
Implantable Lead Model: 5076
Implantable Lead Model: 5076
Implantable Pulse Generator Implant Date: 20180808
Lead Channel Impedance Value: 480 Ohm
Lead Channel Pacing Threshold Amplitude: 0.75 V
Lead Channel Pacing Threshold Pulse Width: 0.5 ms
Lead Channel Sensing Intrinsic Amplitude: 12 mV
Lead Channel Sensing Intrinsic Amplitude: 4 mV
Lead Channel Setting Pacing Amplitude: 2 V
Lead Channel Setting Sensing Sensitivity: 2 mV
MDC IDC LEAD LOCATION: 753859
MDC IDC MSMT LEADCHNL RV IMPEDANCE VALUE: 480 Ohm
MDC IDC MSMT LEADCHNL RV PACING THRESHOLD AMPLITUDE: 0.75 V
MDC IDC MSMT LEADCHNL RV PACING THRESHOLD PULSEWIDTH: 0.5 ms
MDC IDC PG SERIAL: 8932564
MDC IDC SESS DTM: 20200213092417
MDC IDC SET LEADCHNL RV PACING AMPLITUDE: 2.5 V
MDC IDC SET LEADCHNL RV PACING PULSEWIDTH: 0.5 ms
MDC IDC STAT BRADY AP VP PERCENT: 6.2 %

## 2019-01-08 NOTE — Progress Notes (Signed)
Remote pacemaker transmission.   

## 2019-01-09 NOTE — Progress Notes (Signed)
Cardiology Office Note   Date:  01/10/2019   ID:  [name], DOB 1929/11/03, MRN [id number]  PCP:  [name], MD  Cardiologist:   No primary care provider on file.   No chief complaint on file.     History of Present Illness: Chad [name] is a 85 y.o. male who presents for follow up of atrial fib.   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  He has been treated for SVT as well by Dr. Caryl Comes.  At the last visit in the fall he was having palpitations and his beta blocker was increased.  He says that he occasionally will still get palpitations.  He had an episode lasting about 10 minutes.  It sounds like he is able to do a vagal maneuver and stop it.  He otherwise is done relatively well.  He is limited with back pain and joint pain.  He has no new shortness of breath, PND or orthopnea.  He has no presyncope or syncope.  He does have lower extremity swelling.  He occasionally has to take an extra diuretic.  His feet swell at the end of the day but not in the morning.   Past Medical History:  Diagnosis Date  . Bladder cancer ([place]) 2010  . Chronic bronchitis ([place])   . Hypertension   . Hypoglycemia   . On home oxygen therapy    "2L w/CPAP at night" (06/21/2017)  . OSA on CPAP    "w/2L O2 at night" (06/21/2017)  . Persistent atrial fibrillation       . Pneumonia    "when he was young; again in 2016" (06/21/2017)  . Presence of permanent cardiac pacemaker   . Sensory ataxia     Past Surgical History:  Procedure Laterality Date  . BACK SURGERY    . CARDIOVERSION N/A 04/23/2015   Procedure: CARDIOVERSION;  Surgeon: [name], MD;  Location: [place];  Service: Cardiovascular;  Laterality: N/A;  . CARDIOVERSION N/A 07/30/2015   Procedure: CARDIOVERSION;  Surgeon: [name], MD;  Location: [facility] ENDOSCOPY;  Service: Cardiovascular;  Laterality: N/A;  . EYE SURGERY Bilateral 2009   "to lower pressure; no glaucoma" (06/21/2017)  . INSERT / REPLACE /  REMOVE PACEMAKER  06/21/2017  . [place]  . PACEMAKER IMPLANT N/A 06/21/2017   Procedure: Pacemaker Implant;  Surgeon: Deboraha Sprang, MD;  Location: [place] CV [facility];  Service: Cardiovascular;  Laterality: N/A;  . TRANSURETHRAL RESECTION OF BLADDER TUMOR WITH GYRUS (TURBT-GYRUS)  07/10/2009    Cystourethroscopy, gyrus TURBT (transurethral  resection of bladder tumor).[name] 03/16/2011     Current Outpatient Medications  Medication Sig Dispense Refill  . Calcium-Magnesium 500-250 MG TABS Take 1 tablet by mouth daily.      . Cholecalciferol (VITAMIN D3) 2000 units capsule Take 2,000 Units by mouth 2 (two) times daily.    . Coenzyme Q10 (EQL COQ10) 100 MG capsule Take 200 mg by mouth daily.     Marland Kitchen dofetilide (TIKOSYN) 250 MCG capsule TAKE 1 CAPSULE BY MOUTH TWICE DAILY 180 capsule 2  . escitalopram (LEXAPRO) 10 MG tablet Take 10 mg by mouth at bedtime.     . furosemide (LASIX) 20 MG tablet TAKE 1 TABLET BY MOUTH ONCE DAILY AS NEEDED FOR  FLUID  OR  EDEMA 90 tablet 3  . irbesartan (AVAPRO) 300 MG tablet Take 0.5 tablets (150 mg total) by mouth daily. 90 tablet 2  . metoprolol  succinate (TOPROL-XL) 50 MG 24 hr tablet Take 2 tabs (100mg ) in the AM, take 1 tab (50mg ) in the evening 270 tablet 3  . Multiple Vitamins-Minerals (CENTRUM SILVER PO) Take 1 tablet by mouth daily.      . OXYGEN Inhale 2 L into the lungs at bedtime. Uses with CPAP machine    . vitamin B-12 (CYANOCOBALAMIN) 500 MCG tablet Take 500 mcg by mouth 2 (two) times daily.    Marland Kitchen warfarin (COUMADIN) 5 MG tablet TAKE 1/2 TO 1 (ONE-HALF TO ONE) TABLET BY MOUTH ONCE DAILY AS DIRECTED BY  COUMADIN  CLINIC 90 tablet 1   No current facility-administered medications for this visit.     Allergies:   Carbapenems; Cephalosporins; Chlorpheniramine; Ciprofloxacin; Decongestant [oxymetazoline]; Diltiazem; Flagyl [metronidazole]; Hctz [hydrochlorothiazide]; Levaquin [levofloxacin in d5w]; Levofloxacin; Lidocaine; Penicillins;  Teline [tetracycline]; Tetanus toxoid; Z-pak [azithromycin]; Sulfonamide derivatives; and Toprol xl [metoprolol tartrate]    ROS:  Please see the history of present illness.   Otherwise, review of systems are positive for neuropathy, hip and back pain.   All other systems are reviewed and negative.    PHYSICAL EXAM: VS:  BP (!) 143/69   Pulse 69   Ht 5\' 9"  (1.753 m)   Wt 271 lb 3.2 oz (123 kg)   BMI 40.05 kg/m  , BMI Body mass index is 40.05 kg/m. GENERAL:  Well appearing NECK:  No jugular venous distention, waveform within normal limits, carotid upstroke brisk and symmetric, no bruits, no thyromegaly LUNGS:  Clear to auscultation bilaterally CHEST:  Well healed pacemaker pocket. HEART:  PMI not displaced or sustained,S1 and S2 within normal limits, no S3, no S4, no clicks, no rubs, no murmurs ABD:  Flat, positive bowel sounds normal in frequency in pitch, no bruits, no rebound, no guarding, no midline pulsatile mass, no hepatomegaly, no splenomegaly EXT:  2 plus pulses throughout, moderate edema, no cyanosis no clubbing  EKG: Atrial paced rhythm with premature atrial contractions, right bundle branch block, nonspecific diffuse T wave inversions unchanged from previous  Recent Labs: 07/25/2018: BUN 16; Creatinine, Ser 1.09; Hemoglobin 15.5; Magnesium 2.4; Platelets 194; Potassium 4.3; Sodium 141    Lipid Panel    Component Value Date/Time   CHOL 179 06/10/2010 1617   TRIG 75 06/10/2010 1617   HDL 46 06/10/2010 1617   LDLCALC 118 06/10/2010 1617      Wt Readings from Last 3 Encounters:  01/10/19 271 lb 3.2 oz (123 kg)  07/25/18 269 lb 9.6 oz (122.3 kg)  01/09/18 264 lb (119.7 kg)      Other studies Reviewed: Additional studies/ records that were reviewed today include: EP records Review of the above records demonstrates:  See above   ASSESSMENT AND PLAN:   ATRIAL FIB:  Mr. [name] has a CHA2DS2 - VASc score of 3.   He does not seem to have significant  symptomatic paroxysms and no change in therapy is indicated.  He tolerates anticoagulation.  HTN:  The blood pressure is at target.  No change in therapy.   HFpEF:  He seems to have no symptoms of left-sided failure.  He does have some lower extremity edema.  I suggested compression stockings.  SVT:   He has symptomatic paroxysms but he is able to break these.  They seem to be improved with a higher dose of beta-blocker.  No change in therapy.   Current medicines are reviewed at length with the patient today.  The patient does not have concerns regarding medicines.  The following changes have been made:   None  Labs/ tests ordered today include:  None No orders of the defined types were placed in this encounter.    Disposition:   FU with me in 12 months.     Signed, Minus Breeding, MD  01/10/2019 11:07 AM    [facility]

## 2019-01-10 ENCOUNTER — Ambulatory Visit: Payer: Medicare Other | Admitting: Cardiology

## 2019-01-10 ENCOUNTER — Encounter: Payer: Self-pay | Admitting: Cardiology

## 2019-01-10 VITALS — BP 143/69 | HR 69 | Ht 69.0 in | Wt 271.2 lb

## 2019-01-10 DIAGNOSIS — I4819 Other persistent atrial fibrillation: Secondary | ICD-10-CM | POA: Diagnosis not present

## 2019-01-10 DIAGNOSIS — I10 Essential (primary) hypertension: Secondary | ICD-10-CM | POA: Diagnosis not present

## 2019-01-10 NOTE — Patient Instructions (Signed)
Medication Instructions:  Continue current medications  If you need a refill on your cardiac medications before your next appointment, please call your pharmacy.  Labwork: None Ordered   Testing/Procedures: None Ordered  Follow-Up: You will need a follow up appointment in 1 Year.  Please call our office 2 months in advance to schedule this appointment.  You may see Dr Hochrein or one of the following Advanced Practice Providers on your designated Care Team:   Rhonda Barrett, PA-C . Kathryn Lawrence, DNP, ANP   At [facility], you and your health needs are our priority.  As part of our continuing mission to provide you with exceptional heart care, we have created designated Provider Care Teams.  These Care Teams include your primary Cardiologist (physician) and Advanced Practice Providers (APPs -  Physician Assistants and Nurse Practitioners) who all work together to provide you with the care you need, when you need it.  Thank you for choosing [facility]!!     

## 2019-01-14 ENCOUNTER — Ambulatory Visit (INDEPENDENT_AMBULATORY_CARE_PROVIDER_SITE_OTHER): Payer: Medicare Other | Admitting: *Deleted

## 2019-01-14 DIAGNOSIS — I4819 Other persistent atrial fibrillation: Secondary | ICD-10-CM

## 2019-01-14 DIAGNOSIS — Z5181 Encounter for therapeutic drug level monitoring: Secondary | ICD-10-CM

## 2019-01-14 LAB — POCT INR: INR: 4.3 — AB (ref 2.0–3.0)

## 2019-01-14 NOTE — Patient Instructions (Signed)
Hold coumadin tonight and tomorrow night the decrease dose to 1/2 tablet daily Recheck INR in 2 weeks

## 2019-01-17 DIAGNOSIS — M5136 Other intervertebral disc degeneration, lumbar region: Secondary | ICD-10-CM | POA: Diagnosis not present

## 2019-01-17 DIAGNOSIS — G894 Chronic pain syndrome: Secondary | ICD-10-CM | POA: Diagnosis not present

## 2019-01-17 DIAGNOSIS — Z6841 Body Mass Index (BMI) 40.0 and over, adult: Secondary | ICD-10-CM | POA: Diagnosis not present

## 2019-01-18 ENCOUNTER — Emergency Department (HOSPITAL_COMMUNITY)
Admission: EM | Admit: 2019-01-18 | Discharge: 2019-01-18 | Disposition: A | Discharge status: Home / Self Care | Payer: Medicare Other | Attending: Emergency Medicine | Admitting: Emergency Medicine

## 2019-01-18 ENCOUNTER — Encounter (HOSPITAL_COMMUNITY): Payer: Self-pay | Admitting: *Deleted

## 2019-01-18 ENCOUNTER — Emergency Department (HOSPITAL_COMMUNITY): Payer: Medicare Other

## 2019-01-18 ENCOUNTER — Other Ambulatory Visit: Payer: Self-pay

## 2019-01-18 DIAGNOSIS — R918 Other nonspecific abnormal finding of lung field: Secondary | ICD-10-CM | POA: Diagnosis not present

## 2019-01-18 DIAGNOSIS — M25559 Pain in unspecified hip: Secondary | ICD-10-CM | POA: Diagnosis present

## 2019-01-18 DIAGNOSIS — R0602 Shortness of breath: Secondary | ICD-10-CM

## 2019-01-18 DIAGNOSIS — R079 Chest pain, unspecified: Secondary | ICD-10-CM | POA: Diagnosis not present

## 2019-01-18 DIAGNOSIS — Z87891 Personal history of nicotine dependence: Secondary | ICD-10-CM | POA: Insufficient documentation

## 2019-01-18 DIAGNOSIS — M25552 Pain in left hip: Secondary | ICD-10-CM | POA: Insufficient documentation

## 2019-01-18 DIAGNOSIS — M160 Bilateral primary osteoarthritis of hip: Secondary | ICD-10-CM | POA: Diagnosis not present

## 2019-01-18 DIAGNOSIS — R52 Pain, unspecified: Secondary | ICD-10-CM

## 2019-01-18 DIAGNOSIS — I10 Essential (primary) hypertension: Secondary | ICD-10-CM | POA: Diagnosis not present

## 2019-01-18 DIAGNOSIS — Z79899 Other long term (current) drug therapy: Secondary | ICD-10-CM | POA: Insufficient documentation

## 2019-01-18 DIAGNOSIS — M25551 Pain in right hip: Secondary | ICD-10-CM | POA: Diagnosis not present

## 2019-01-18 DIAGNOSIS — R1084 Generalized abdominal pain: Secondary | ICD-10-CM | POA: Insufficient documentation

## 2019-01-18 DIAGNOSIS — K5730 Diverticulosis of large intestine without perforation or abscess without bleeding: Secondary | ICD-10-CM | POA: Diagnosis not present

## 2019-01-18 LAB — URINALYSIS, ROUTINE W REFLEX MICROSCOPIC
Bilirubin Urine: NEGATIVE
Glucose, UA: NEGATIVE mg/dL
Hgb urine dipstick: NEGATIVE
Ketones, ur: NEGATIVE mg/dL
Leukocytes,Ua: NEGATIVE
Nitrite: NEGATIVE
PH: 7 (ref 5.0–8.0)
Protein, ur: NEGATIVE mg/dL
SPECIFIC GRAVITY, URINE: 1.01 (ref 1.005–1.030)

## 2019-01-18 LAB — COMPREHENSIVE METABOLIC PANEL
ALT: 16 U/L (ref 0–44)
AST: 26 U/L (ref 15–41)
Albumin: 3.3 g/dL — ABNORMAL LOW (ref 3.5–5.0)
Alkaline Phosphatase: 78 U/L (ref 38–126)
Anion gap: 8 (ref 5–15)
BUN: 18 mg/dL (ref 8–23)
CO2: 27 mmol/L (ref 22–32)
CREATININE: 1.12 mg/dL (ref 0.61–1.24)
Calcium: 9.2 mg/dL (ref 8.9–10.3)
Chloride: 102 mmol/L (ref 98–111)
GFR calc Af Amer: >60 mL/min (ref >60)
GFR calc non Af Amer: 58 mL/min — ABNORMAL LOW (ref >60)
GLUCOSE: 165 mg/dL — AB (ref 70–99)
Potassium: 4.4 mmol/L (ref 3.5–5.1)
Sodium: 137 mmol/L (ref 135–145)
Total Bilirubin: 0.5 mg/dL (ref 0.3–1.2)
Total Protein: 6.4 g/dL — ABNORMAL LOW (ref 6.5–8.1)

## 2019-01-18 LAB — BRAIN NATRIURETIC PEPTIDE: B Natriuretic Peptide: 169 pg/mL — ABNORMAL HIGH (ref 0.0–100.0)

## 2019-01-18 LAB — CBC WITH DIFFERENTIAL/PLATELET
ABS IMMATURE GRANULOCYTES: 0.03 10*3/uL (ref 0.00–0.07)
Basophils Absolute: 0 10*3/uL (ref 0.0–0.1)
Basophils Relative: 1 %
Eosinophils Absolute: 0.3 10*3/uL (ref 0.0–0.5)
Eosinophils Relative: 4 %
HCT: 44.7 % (ref 39.0–52.0)
Hemoglobin: 14 g/dL (ref 13.0–17.0)
Immature Granulocytes: 0 %
LYMPHS ABS: 0.9 10*3/uL (ref 0.7–4.0)
Lymphocytes Relative: 11 %
MCH: 28.9 pg (ref 26.0–34.0)
MCHC: 31.3 g/dL (ref 30.0–36.0)
MCV: 92.2 fL (ref 80.0–100.0)
MONOS PCT: 7 %
Monocytes Absolute: 0.5 10*3/uL (ref 0.1–1.0)
Neutro Abs: 6 10*3/uL (ref 1.7–7.7)
Neutrophils Relative %: 77 %
Platelets: 212 10*3/uL (ref 150–400)
RBC: 4.85 MIL/uL (ref 4.22–5.81)
RDW: 13.4 % (ref 11.5–15.5)
WBC: 7.7 10*3/uL (ref 4.0–10.5)
nRBC: 0 % (ref 0.0–0.2)

## 2019-01-18 LAB — LIPASE, BLOOD: Lipase: 32 U/L (ref 11–51)

## 2019-01-18 LAB — PROTIME-INR
INR: 1.5 — AB (ref 0.8–1.2)
Prothrombin Time: 17.8 seconds — ABNORMAL HIGH (ref 11.4–15.2)

## 2019-01-18 LAB — TROPONIN I: Troponin I: <0.03 ng/mL (ref <0.03)

## 2019-01-18 MED ORDER — MORPHINE SULFATE (PF) 4 MG/ML IV SOLN
4.0000 mg | Freq: Once | INTRAVENOUS | Status: AC
  Administered 2019-01-18: 4 mg via INTRAVENOUS
  Filled 2019-01-18: qty 1

## 2019-01-18 MED ORDER — IOHEXOL 300 MG/ML  SOLN
100.0000 mL | Freq: Once | INTRAMUSCULAR | Status: AC | PRN
  Administered 2019-01-18: 100 mL via INTRAVENOUS

## 2019-01-18 MED ORDER — HYDROCODONE-ACETAMINOPHEN 5-325 MG PO TABS
2.0000 | ORAL_TABLET | Freq: Once | ORAL | Status: AC
  Administered 2019-01-18: 2 via ORAL
  Filled 2019-01-18: qty 2

## 2019-01-18 MED ORDER — HYDROCODONE-ACETAMINOPHEN 5-325 MG PO TABS: 2.0000 | ORAL_TABLET | ORAL | 0 refills | Status: DC | PRN

## 2019-01-18 NOTE — ED Provider Notes (Signed)
[facility] EMERGENCY DEPARTMENT Provider Note   CSN: [id number] Arrival date & time: 01/18/19  1252    History   Chief Complaint Chief Complaint  Patient presents with  . Hip Pain    HPI Chad [name] is a 85 y.o. male.  He is presenting to the emergency department with multiple complaints.  He said he has had back and hip problems for a long time and they have been worse over the past few months.  He is also been troubled by diffuse abdominal and chest pain.  He has had prostate issues before he does not know if it is his arthritis or his prostate or something else acting up.  He is complaining of some generalized lower abdominal pain and pain in his hips that increased with movement.  No known fevers vomiting or diarrhea.  No urinary symptoms.  Supposedly he seen his physician for this.     The history is provided by the patient.  Hip Pain  This is a recurrent problem. The current episode started more than 1 week ago. The problem occurs constantly. The problem has not changed since onset.Associated symptoms include chest pain, abdominal pain and shortness of breath. Pertinent negatives include no headaches. The symptoms are aggravated by bending, twisting, standing, exertion and walking. Nothing relieves the symptoms. He has tried nothing for the symptoms. The treatment provided no relief.    Past Medical History:  Diagnosis Date  . Bladder cancer ([place]) 2010  . Chronic bronchitis ([place])   . Hypertension   . Hypoglycemia   . On home oxygen therapy    "2L w/CPAP at night" (06/21/2017)  . OSA on CPAP    "w/2L O2 at night" (06/21/2017)  . Persistent atrial fibrillation       . Pneumonia    "when he was young; again in 2016" (06/21/2017)  . Presence of permanent cardiac pacemaker   . Sensory ataxia     Patient Active Problem List   Diagnosis Date Noted  . Tachycardia-bradycardia syndrome ([place]) 06/21/2017  . Atrial extrasystoles 01/07/2016  . Sleep related  hypoventilation/hypoxemia in other disease 01/07/2016  . OSA on CPAP 01/07/2016  . Persistent atrial fibrillation 07/28/2015  . Acute on chronic diastolic heart failure ([place]) 05/05/2015  . Atrial flutter, unspecified   . Atrial flutter ([place]) 03/13/2015  . Encounter for therapeutic drug monitoring 12/25/2013  . 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 Tinnitus of both ears 09/25/2013  . Nystagmus, end-position 09/25/2013  . Neuropathy 09/25/2013  . Hypersomnia with sleep apnea, unspecified 09/25/2013  . Tinnitus aurium 09/25/2013  . Long term current use of anticoagulant 02/01/2011  . HYPOGLYCEMIA 07/02/2009  . 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 Essential hypertension 07/02/2009  . Atrial fibrillation, persistent 07/02/2009    Past Surgical History:  Procedure Laterality Date  . BACK SURGERY    . CARDIOVERSION N/A 04/23/2015   Procedure: CARDIOVERSION;  Surgeon: [name], MD;  Location: [place];  Service: Cardiovascular;  Laterality: N/A;  . CARDIOVERSION N/A 07/30/2015   Procedure: CARDIOVERSION;  Surgeon: [name], MD;  Location: [facility] ENDOSCOPY;  Service: Cardiovascular;  Laterality: N/A;  . EYE SURGERY Bilateral 2009   "to lower pressure; no glaucoma" (06/21/2017)  . INSERT / REPLACE / REMOVE PACEMAKER  06/21/2017  . [place]  . PACEMAKER IMPLANT N/A 06/21/2017   Procedure: Pacemaker Implant;  Surgeon: Deboraha Sprang, MD;  Location: [place] CV LAB;  Service: Cardiovascular;  Laterality: N/A;  . TRANSURETHRAL RESECTION OF BLADDER  TUMOR WITH GYRUS (TURBT-GYRUS)  07/10/2009    Cystourethroscopy, gyrus TURBT (transurethral  resection of bladder tumor).[name] 03/16/2011        Home Medications    Prior to Admission medications   Medication Sig Start Date End Date Taking? Authorizing Provider  Calcium-Magnesium 500-250 MG TABS Take 1 tablet by mouth daily.      [provider]  Cholecalciferol (VITAMIN D3) 2000 units capsule Take 2,000 Units by mouth 2  (two) times daily.    [provider]  Coenzyme Q10 (EQL COQ10) 100 MG capsule Take 200 mg by mouth daily.     [provider]  dofetilide (TIKOSYN) 250 MCG capsule TAKE 1 CAPSULE BY MOUTH TWICE DAILY 05/24/18   Minus Breeding, MD  escitalopram (LEXAPRO) 10 MG tablet Take 10 mg by mouth at bedtime.     [provider]  furosemide (LASIX) 20 MG tablet TAKE 1 TABLET BY MOUTH ONCE DAILY AS NEEDED FOR  FLUID  OR  EDEMA 10/23/18   Deboraha Sprang, MD  irbesartan (AVAPRO) 300 MG tablet Take 0.5 tablets (150 mg total) by mouth daily. 07/25/18   Deboraha Sprang, MD  metoprolol succinate (TOPROL-XL) 50 MG 24 hr tablet Take 2 tabs (100mg ) in the AM, take 1 tab (50mg ) in the evening 07/31/18   Deboraha Sprang, MD  Multiple Vitamins-Minerals (CENTRUM SILVER PO) Take 1 tablet by mouth daily.      [provider]  OXYGEN Inhale 2 L into the lungs at bedtime. Uses with CPAP machine    [provider]  vitamin B-12 (CYANOCOBALAMIN) 500 MCG tablet Take 500 mcg by mouth 2 (two) times daily.    [provider]  warfarin (COUMADIN) 5 MG tablet TAKE 1/2 TO 1 (ONE-HALF TO ONE) TABLET BY MOUTH ONCE DAILY AS DIRECTED BY  COUMADIN  CLINIC 05/01/18   Minus Breeding, MD    Family History Family History  Problem Relation Age of Onset  . Heart failure Father   . Hypertension Father   . Dementia Mother     Social History Social History   Tobacco Use  . Smoking status: Former Smoker    Packs/day: 0.50    Years: 20.00    Pack years: 10.00    Types: Cigarettes    Last attempt to quit: 06/03/1971    Years since quitting: 47.6  . Smokeless tobacco: Never Used  Substance Use Topics  . Alcohol use: No  . Drug use: No     Allergies   Carbapenems; Cephalosporins; Chlorpheniramine; Ciprofloxacin; Decongestant [oxymetazoline]; Diltiazem; Flagyl [metronidazole]; Hctz [hydrochlorothiazide]; Levaquin [levofloxacin in d5w]; Levofloxacin; Lidocaine; Penicillins; Teline  [tetracycline]; Tetanus toxoid; Z-pak [azithromycin]; Sulfonamide derivatives; and Toprol xl [metoprolol tartrate]   Review of Systems Review of Systems  Constitutional: Negative for fever.  HENT: Negative for sore throat.   Eyes: Negative for visual disturbance.  Respiratory: Positive for shortness of breath.   Cardiovascular: Positive for chest pain.  Gastrointestinal: Positive for abdominal pain. Negative for blood in stool, constipation, diarrhea, nausea and vomiting.  Genitourinary: Positive for testicular pain. Negative for dysuria, hematuria and scrotal swelling.  Musculoskeletal: Positive for back pain. Negative for neck pain.  Skin: Negative for rash.  Neurological: Negative for syncope and headaches.     Physical Exam Updated Vital Signs BP 136/61   Pulse 64   Temp 97.6 F (36.4 C)   Resp 18   Ht 5' 9.5" (1.765 m)   Wt 119.3 kg   SpO2 91%   BMI 38.28  kg/m   Physical Exam Vitals signs and nursing note reviewed. Exam conducted with a chaperone present.  Constitutional:      Appearance: He is well-developed.  HENT:     Head: Normocephalic and atraumatic.  Eyes:     Conjunctiva/sclera: Conjunctivae normal.  Neck:     Musculoskeletal: Neck supple.  Cardiovascular:     Rate and Rhythm: Normal rate and regular rhythm.     Heart sounds: No murmur.  Pulmonary:     Effort: Pulmonary effort is normal. No respiratory distress.     Breath sounds: Wheezing present.  Abdominal:     Palpations: Abdomen is soft.     Tenderness: There is no abdominal tenderness. There is no guarding or rebound.     Hernia: No hernia is present.  Genitourinary:    Scrotum/Testes:        Right: Tenderness present. Mass not present.        Left: Tenderness present. Mass not present.  Musculoskeletal: Normal range of motion.  Skin:    General: Skin is warm and dry.  Neurological:     Mental Status: He is alert.      ED Treatments / Results  Labs (all labs ordered are listed, but  only abnormal results are displayed) Labs Reviewed  COMPREHENSIVE METABOLIC PANEL - Abnormal; Notable for the following components:      Result Value   Glucose, Bld 165 (*)    Total Protein 6.4 (*)    Albumin 3.3 (*)    GFR calc non Af Amer 58 (*)    All other components within normal limits  BRAIN NATRIURETIC PEPTIDE - Abnormal; Notable for the following components:   B Natriuretic Peptide 169.0 (*)    All other components within normal limits  PROTIME-INR - Abnormal; Notable for the following components:   Prothrombin Time 17.8 (*)    INR 1.5 (*)    All other components within normal limits  URINE CULTURE  LIPASE, BLOOD  CBC WITH DIFFERENTIAL/PLATELET  TROPONIN I  URINALYSIS, ROUTINE W REFLEX MICROSCOPIC    EKG EKG Interpretation  Date/Time:  Friday January 18 2019 14:05:11 EST Ventricular Rate:  66 PR Interval:    QRS Duration: 154 QT Interval:  467 QTC Calculation: 475 R Axis:   30 Text Interpretation:  Atrial-paced complexes Right bundle branch block Abnormal T, consider ischemia, lateral leads similar to prior 8/18 Confirmed by Aletta Edouard [phone]) on 01/18/2019 2:14:34 PM   Radiology Dg Chest 1 View  Result Date: 01/18/2019 CLINICAL DATA:  Shortness of breath EXAM: CHEST  1 VIEW COMPARISON:  06/22/2017 FINDINGS: Left-sided pacing device as before. Borderline cardiomegaly. Aortic atherosclerosis. Mild increased opacity in the right upper lobe adjacent to the pulmonary fissure. Vascular congestion. IMPRESSION: 1. Mild opacity in the right upper lobe adjacent to the pulmonary fissure may reflect atelectasis or a mild pneumonia 2. Cardiomegaly with central vascular congestion. Electronically Signed   By: Donavan Foil M.D.   On: 01/18/2019 15:13   Ct Abdomen Pelvis W Contrast  Result Date: 01/18/2019 CLINICAL DATA:  Generalized abdominal pain EXAM: CT ABDOMEN AND PELVIS WITH CONTRAST TECHNIQUE: Multidetector CT imaging of the abdomen and pelvis was performed using the  standard protocol following bolus administration of intravenous contrast. CONTRAST:  112mL OMNIPAQUE IOHEXOL 300 MG/ML  SOLN COMPARISON:  09/27/2011 FINDINGS: Lower chest: Lung bases demonstrate no acute consolidation or pleural effusion. Small pericardial effusion. Partially visualized intracardiac pacing leads. Cardiomegaly. Hepatobiliary: Subcentimeter hypodensity within the left hepatic lobe, too small to  further characterize. No biliary dilatation. Possible small amount of hyperdense sludge in the gallbladder. Pancreas: Unremarkable. No pancreatic ductal dilatation or surrounding inflammatory changes. Spleen: Normal in size without focal abnormality. Adrenals/Urinary Tract: Adrenal glands are normal. No hydronephrosis. Bilobed or 2 adjacent cysts in the mid right kidney measuring 6.1 cm. The urinary bladder is unremarkable Stomach/Bowel: Stomach is within normal limits. Appendix appears normal. No evidence of bowel wall thickening, distention, or inflammatory changes. Sigmoid colon diverticular disease without acute inflammatory process. Vascular/Lymphatic: Moderate aortic atherosclerosis. No aneurysm. No significantly enlarged lymph nodes. Reproductive: Enlarged heterogeneous prostate Other: Negative for free air or free fluid further coarse calcification of left lower quadrant omental nodule. Musculoskeletal: Degenerative changes of the spine without acute or suspicious abnormality. IMPRESSION: 1. No CT evidence for acute intra-abdominal or pelvic abnormality. Possible small amount of hyperdense sludge in the gallbladder 2. Sigmoid colon diverticular disease without acute inflammatory process 3. Prostatomegaly 4. Small pericardial effusion Electronically Signed   By: Donavan Foil M.D.   On: 01/18/2019 16:03   Dg Hips Bilat With Pelvis Min 5 Views  Result Date: 01/18/2019 CLINICAL DATA:  Bilateral hip pain EXAM: DG HIP (WITH OR WITHOUT PELVIS) 5+V BILAT COMPARISON:  None. FINDINGS: No acute displaced  fracture or malalignment. Pubic symphysis and rami are intact. Moderate arthritis of the bilateral hips. Vascular calcifications. IMPRESSION: Moderate arthritis.  No definite acute osseous abnormality. Electronically Signed   By: Donavan Foil M.D.   On: 01/18/2019 15:16    Procedures Procedures (including critical care time)  Medications Ordered in ED Medications  morphine 4 MG/ML injection 4 mg (4 mg Intravenous Given 01/18/19 1408)  iohexol (OMNIPAQUE) 300 MG/ML solution 100 mL (100 mLs Intravenous Contrast Given 01/18/19 1543)  HYDROcodone-acetaminophen (NORCO/VICODIN) 5-325 MG per tablet 2 tablet (2 tablets Oral Given 01/18/19 1723)     Initial Impression / Assessment and Plan / ED Course  I have reviewed the triage vital signs and the nursing notes.  Pertinent labs & imaging results that were available during my care of the patient were reviewed by me and considered in my medical decision making (see chart for details).  Clinical Course as of Jan 19 1031  Fri Jan 18, 2019  1523 She states he had some pain relief with the morphine.  His lab work is underwhelming.  His INR is subtherapeutic at 1.5.  I put him in for CT abdomen pelvis as he is so vague about his symptoms.  Troponin normal.   [MB]  1524 Chest x-ray they read as possible infiltrate versus atelectasis.   [MB]    Clinical Course User Index [MB] Hayden Rasmussen, MD       Final Clinical Impressions(s) / ED Diagnoses   Final diagnoses:  Acute pain of both hips    ED Discharge Orders         Ordered    HYDROcodone-acetaminophen (NORCO/VICODIN) 5-325 MG tablet  Every 4 hours PRN     01/18/19 2100           Hayden Rasmussen, MD 01/19/19 [phone]

## 2019-01-18 NOTE — ED Notes (Signed)
Pt ambulated well with a walked from and to room.

## 2019-01-18 NOTE — ED Triage Notes (Signed)
Left hip pain, states he was seen by PCP  FOR SAME, sever pain, unable to walk

## 2019-01-18 NOTE — ED Provider Notes (Signed)
Pain medicine did work well here patient was able to ambulate.  Patient willing to go home.  He was given a prepack of hydrocodone to help him in the short run.  He will follow-up with his doctors.   Fredia Sorrow, MD 01/18/19 2101

## 2019-01-18 NOTE — Discharge Instructions (Addendum)
Take the hydrocodone as directed.  Return for any new or worse symptoms.  Make an appointment to follow-up with your doctors.

## 2019-01-21 LAB — URINE CULTURE
Culture: 10000 — AB
Special Requests: NORMAL

## 2019-01-22 ENCOUNTER — Telehealth: Payer: Self-pay | Admitting: Emergency Medicine

## 2019-01-22 NOTE — Telephone Encounter (Signed)
Post ED Visit - Positive Culture Follow-up  Culture report reviewed by antimicrobial stewardship pharmacist: [place] Team []  Elenor Quinones, Pharm.D. []  Heide Guile, Pharm.D., BCPS AQ-ID []  Parks Neptune, Pharm.D., BCPS []  Alycia Rossetti, Pharm.D., BCPS []  [place], Pharm.D., BCPS, AAHIVP []  Legrand Como, Pharm.D., BCPS, AAHIVP []  Salome Arnt, PharmD, BCPS []  Johnnette Gourd, PharmD, BCPS []  Hughes Better, PharmD, BCPS []  Leeroy Cha, PharmD []  Laqueta Linden, PharmD, BCPS []  Albertina Parr, PharmD Janae Bridgeman PharmD  [place] Team []  Leodis Sias, PharmD []  Lindell Spar, PharmD []  Royetta Asal, PharmD []  Graylin Shiver, Rph []  Rema Fendt) Glennon Mac, PharmD []  Arlyn Dunning, PharmD []  Netta Cedars, PharmD []  Dia Sitter, PharmD []  Leone Haven, PharmD []  Gretta Arab, PharmD []  Theodis Shove, PharmD []  Peggyann Juba, PharmD []  Reuel Boom, PharmD   Positive urine culture Treated with none, asymptomatic, no further patient follow-up is required at this time.  Hazle Nordmann 01/22/2019, 9:22 AM

## 2019-01-23 ENCOUNTER — Other Ambulatory Visit: Payer: Self-pay | Admitting: Cardiology

## 2019-01-23 MED FILL — Hydrocodone-Acetaminophen Tab 5-325 MG: ORAL | Qty: 6 | Status: AC

## 2019-01-28 ENCOUNTER — Other Ambulatory Visit: Payer: Self-pay

## 2019-01-28 ENCOUNTER — Ambulatory Visit (INDEPENDENT_AMBULATORY_CARE_PROVIDER_SITE_OTHER): Payer: Medicare Other | Admitting: *Deleted

## 2019-01-28 DIAGNOSIS — M545 Low back pain: Secondary | ICD-10-CM | POA: Diagnosis not present

## 2019-01-28 DIAGNOSIS — Z681 Body mass index (BMI) 19 or less, adult: Secondary | ICD-10-CM | POA: Diagnosis not present

## 2019-01-28 DIAGNOSIS — M13851 Other specified arthritis, right hip: Secondary | ICD-10-CM | POA: Diagnosis not present

## 2019-01-28 DIAGNOSIS — Z5181 Encounter for therapeutic drug level monitoring: Secondary | ICD-10-CM | POA: Diagnosis not present

## 2019-01-28 DIAGNOSIS — I4819 Other persistent atrial fibrillation: Secondary | ICD-10-CM

## 2019-01-28 DIAGNOSIS — M13852 Other specified arthritis, left hip: Secondary | ICD-10-CM | POA: Diagnosis not present

## 2019-01-28 LAB — POCT INR: INR: 2.2 (ref 2.0–3.0)

## 2019-01-28 NOTE — Patient Instructions (Signed)
Continue coumadin 1/2 tablet daily Recheck INR in 3 weeks 

## 2019-02-19 ENCOUNTER — Other Ambulatory Visit: Payer: Self-pay

## 2019-02-19 ENCOUNTER — Telehealth: Payer: Self-pay | Admitting: *Deleted

## 2019-02-19 NOTE — Telephone Encounter (Signed)

## 2019-02-20 ENCOUNTER — Ambulatory Visit (INDEPENDENT_AMBULATORY_CARE_PROVIDER_SITE_OTHER): Payer: Medicare Other | Admitting: *Deleted

## 2019-02-20 DIAGNOSIS — Z5181 Encounter for therapeutic drug level monitoring: Secondary | ICD-10-CM | POA: Diagnosis not present

## 2019-02-20 DIAGNOSIS — I4819 Other persistent atrial fibrillation: Secondary | ICD-10-CM | POA: Diagnosis not present

## 2019-02-20 LAB — POCT INR: INR: 3 (ref 2.0–3.0)

## 2019-02-20 NOTE — Patient Instructions (Signed)
Continue coumadin 1/2 tablet daily Recheck INR in 4 weeks

## 2019-03-20 ENCOUNTER — Ambulatory Visit (INDEPENDENT_AMBULATORY_CARE_PROVIDER_SITE_OTHER): Payer: Medicare Other | Admitting: *Deleted

## 2019-03-20 DIAGNOSIS — Z5181 Encounter for therapeutic drug level monitoring: Secondary | ICD-10-CM | POA: Diagnosis not present

## 2019-03-20 DIAGNOSIS — I4819 Other persistent atrial fibrillation: Secondary | ICD-10-CM

## 2019-03-20 LAB — POCT INR: INR: 2 (ref 2.0–3.0)

## 2019-03-20 NOTE — Patient Instructions (Signed)
Take coumadin 1 tablet tonight then resume 1/2 tablet daily Recheck INR in 5 weeks

## 2019-03-28 ENCOUNTER — Other Ambulatory Visit: Payer: Self-pay

## 2019-03-28 ENCOUNTER — Ambulatory Visit (INDEPENDENT_AMBULATORY_CARE_PROVIDER_SITE_OTHER): Payer: Medicare Other | Admitting: *Deleted

## 2019-03-28 DIAGNOSIS — I495 Sick sinus syndrome: Secondary | ICD-10-CM

## 2019-03-28 LAB — CUP PACEART REMOTE DEVICE CHECK
Date Time Interrogation Session: 20200514181507
Implantable Lead Implant Date: 20180808
Implantable Lead Implant Date: 20180808
Implantable Lead Location: 753859
Implantable Lead Location: 753860
Implantable Lead Model: 5076
Implantable Lead Model: 5076
Implantable Pulse Generator Implant Date: 20180808
Pulse Gen Model: 2272
Pulse Gen Serial Number: 8932564

## 2019-04-02 ENCOUNTER — Encounter: Payer: Self-pay | Admitting: Cardiology

## 2019-04-02 NOTE — Progress Notes (Signed)
Remote pacemaker transmission.   

## 2019-04-23 ENCOUNTER — Telehealth: Payer: Self-pay | Admitting: *Deleted

## 2019-04-23 NOTE — Telephone Encounter (Signed)

## 2019-04-24 ENCOUNTER — Ambulatory Visit (INDEPENDENT_AMBULATORY_CARE_PROVIDER_SITE_OTHER): Payer: Medicare Other | Admitting: *Deleted

## 2019-04-24 DIAGNOSIS — I4819 Other persistent atrial fibrillation: Secondary | ICD-10-CM | POA: Diagnosis not present

## 2019-04-24 DIAGNOSIS — Z5181 Encounter for therapeutic drug level monitoring: Secondary | ICD-10-CM | POA: Diagnosis not present

## 2019-04-24 LAB — POCT INR: INR: 2 (ref 2.0–3.0)

## 2019-04-24 NOTE — Patient Instructions (Signed)
Increase coumadin to 1/2 tablet daily except 1 tablet on Wednesdays Recheck INR in 6 weeks

## 2019-05-03 DIAGNOSIS — Z8551 Personal history of malignant neoplasm of bladder: Secondary | ICD-10-CM | POA: Diagnosis not present

## 2019-05-27 DIAGNOSIS — E6609 Other obesity due to excess calories: Secondary | ICD-10-CM | POA: Diagnosis not present

## 2019-05-27 DIAGNOSIS — Z0001 Encounter for general adult medical examination with abnormal findings: Secondary | ICD-10-CM | POA: Diagnosis not present

## 2019-05-27 DIAGNOSIS — Z6836 Body mass index (BMI) 36.0-36.9, adult: Secondary | ICD-10-CM | POA: Diagnosis not present

## 2019-05-27 DIAGNOSIS — Z1389 Encounter for screening for other disorder: Secondary | ICD-10-CM | POA: Diagnosis not present

## 2019-05-27 DIAGNOSIS — M545 Low back pain: Secondary | ICD-10-CM | POA: Diagnosis not present

## 2019-06-05 ENCOUNTER — Ambulatory Visit (INDEPENDENT_AMBULATORY_CARE_PROVIDER_SITE_OTHER): Payer: Medicare Other | Admitting: *Deleted

## 2019-06-05 DIAGNOSIS — Z5181 Encounter for therapeutic drug level monitoring: Secondary | ICD-10-CM | POA: Diagnosis not present

## 2019-06-05 DIAGNOSIS — I4819 Other persistent atrial fibrillation: Secondary | ICD-10-CM

## 2019-06-05 LAB — POCT INR: INR: 2.5 (ref 2.0–3.0)

## 2019-06-05 NOTE — Patient Instructions (Signed)
Continue coumadin 1/2 tablet daily except 1 tablet on Wednesdays Recheck INR in 6 weeks

## 2019-06-25 ENCOUNTER — Telehealth: Payer: Self-pay | Admitting: Internal Medicine

## 2019-06-25 NOTE — Telephone Encounter (Signed)
  Daughter would like to come with patient to his appt with Dr Caryl Comes on 09/05/19 to help him.

## 2019-06-26 NOTE — Telephone Encounter (Signed)
Pt's daughter calling to ask if she can come with him to his appt. He is HOH and usually has his wife accompany him, but she is ill at the moment. I advised pts daughter she would be allowed to accompany him since he is [facility] and needs assistance with details.

## 2019-06-27 ENCOUNTER — Ambulatory Visit (INDEPENDENT_AMBULATORY_CARE_PROVIDER_SITE_OTHER): Payer: Medicare Other | Admitting: *Deleted

## 2019-06-27 DIAGNOSIS — I495 Sick sinus syndrome: Secondary | ICD-10-CM

## 2019-06-27 LAB — CUP PACEART REMOTE DEVICE CHECK
Battery Remaining Longevity: 113 mo
Battery Remaining Percentage: 95.5 %
Battery Voltage: 3.01 V
Brady Statistic AP VP Percent: 11 %
Brady Statistic AP VS Percent: 74 %
Brady Statistic AS VP Percent: 1.1 %
Brady Statistic AS VS Percent: 14 %
Brady Statistic RA Percent Paced: 82 %
Brady Statistic RV Percent Paced: 12 %
Date Time Interrogation Session: 20200813064208
Implantable Lead Implant Date: 20180808
Implantable Lead Implant Date: 20180808
Implantable Lead Location: 753859
Implantable Lead Location: 753860
Implantable Lead Model: 5076
Implantable Lead Model: 5076
Implantable Pulse Generator Implant Date: 20180808
Lead Channel Impedance Value: 460 Ohm
Lead Channel Impedance Value: 480 Ohm
Lead Channel Sensing Intrinsic Amplitude: 12 mV
Lead Channel Sensing Intrinsic Amplitude: 3.1 mV
Lead Channel Setting Pacing Amplitude: 2 V
Lead Channel Setting Pacing Amplitude: 2.5 V
Lead Channel Setting Pacing Pulse Width: 0.5 ms
Lead Channel Setting Sensing Sensitivity: 2 mV
Pulse Gen Model: 2272
Pulse Gen Serial Number: 8932564

## 2019-07-05 ENCOUNTER — Encounter: Payer: Self-pay | Admitting: Cardiology

## 2019-07-05 NOTE — Progress Notes (Signed)
Remote pacemaker transmission.   

## 2019-07-31 IMAGING — CT CT ABD-PELV W/ CM
2 of 5 series · 16 of 46 positions shown, 18 images · IV contrast (Isovue)
Comparison: 09/27/2011

CLINICAL DATA: Generalized abdominal pain

EXAM:
CT ABDOMEN AND PELVIS WITH CONTRAST
TECHNIQUE: Multidetector CT imaging of the abdomen and pelvis was performed
using the standard protocol following bolus administration of
intravenous contrast.
CONTRAST:  100mL OMNIPAQUE IOHEXOL 300 MG/ML  SOLN

[Series 2: axial st · axial · 0.92mm/px · z∈[+790,+1235]mm · 13 of 99 slices shown, 15 images]
[im 5/99  soft-tissue]
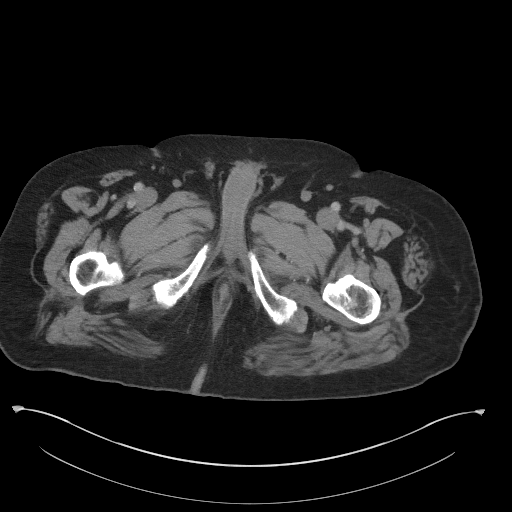
[im 5/99  bone]
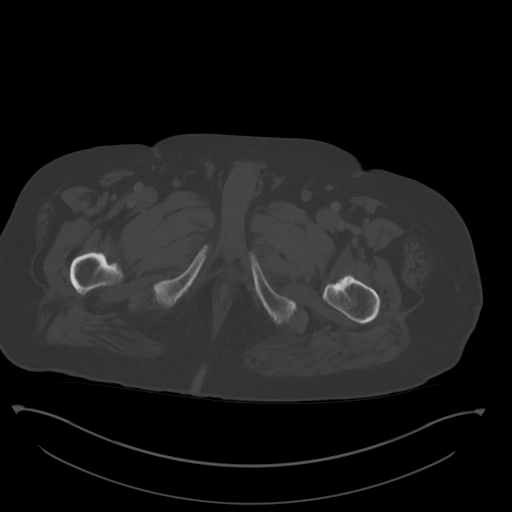
[im 15/99  soft-tissue]
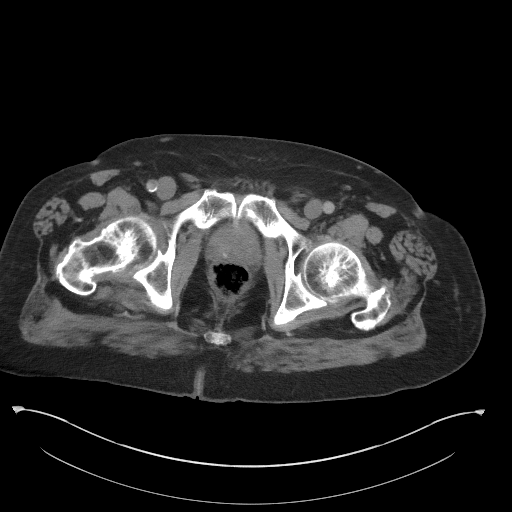
[im 19/99  soft-tissue]
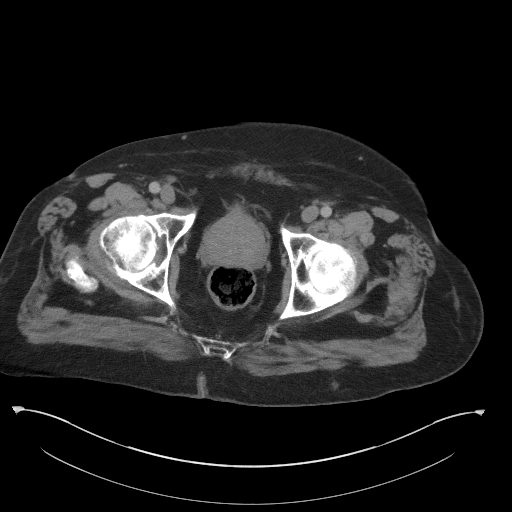
[im 29/99  soft-tissue]
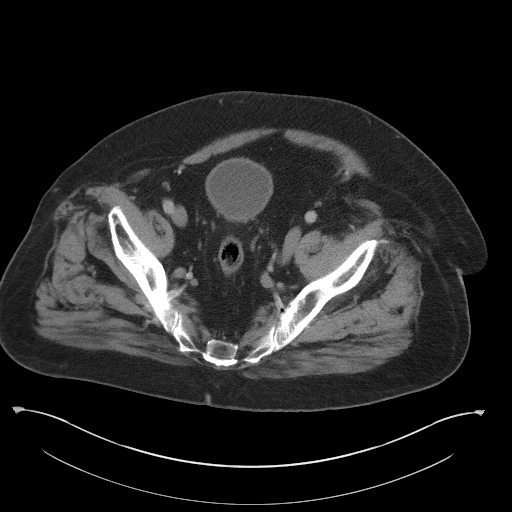
[im 33/99  soft-tissue]
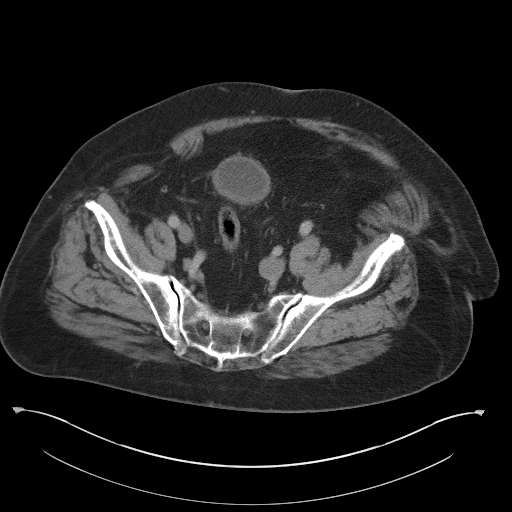
[im 43/99  soft-tissue]
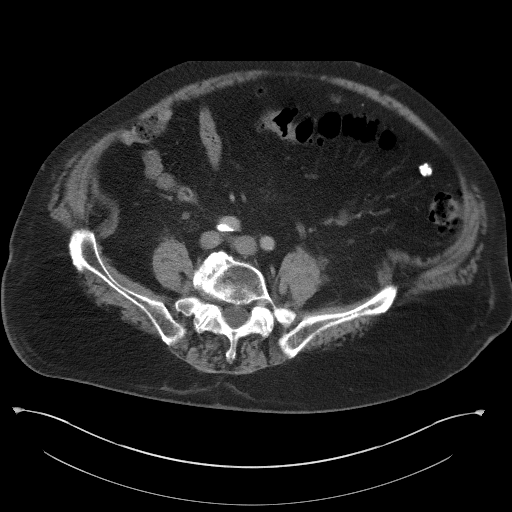
[im 52/99  soft-tissue]
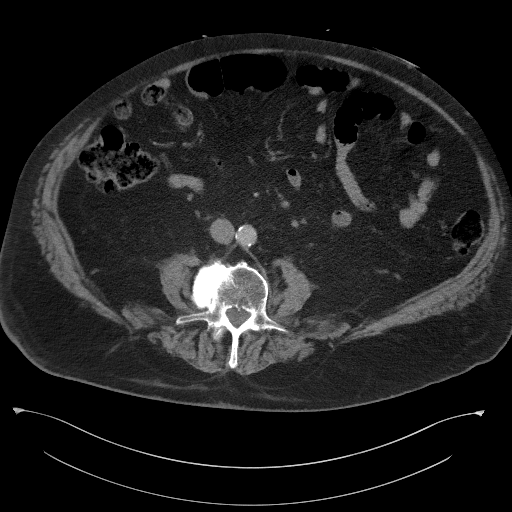
[im 57/99  soft-tissue]
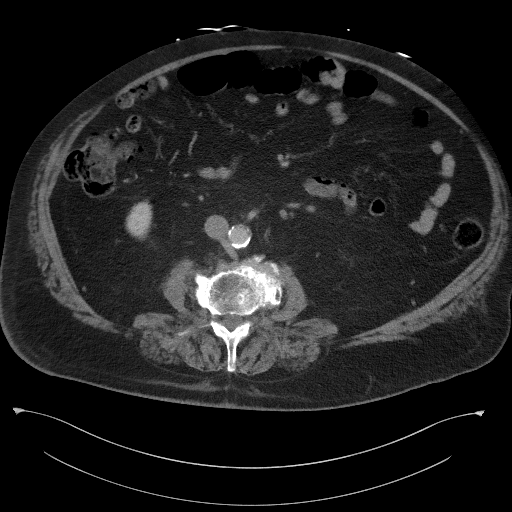
[im 66/99  soft-tissue]
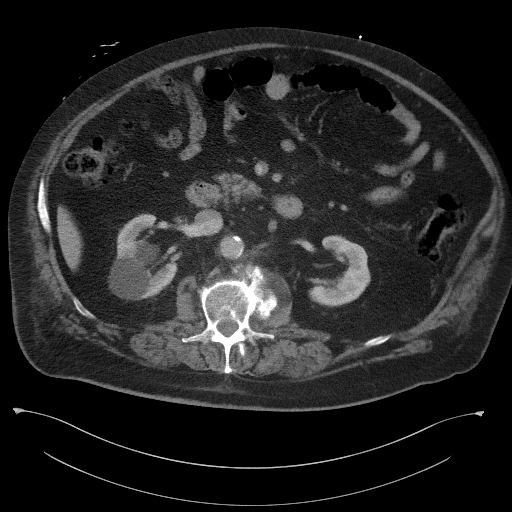
[im 66/99  bone]
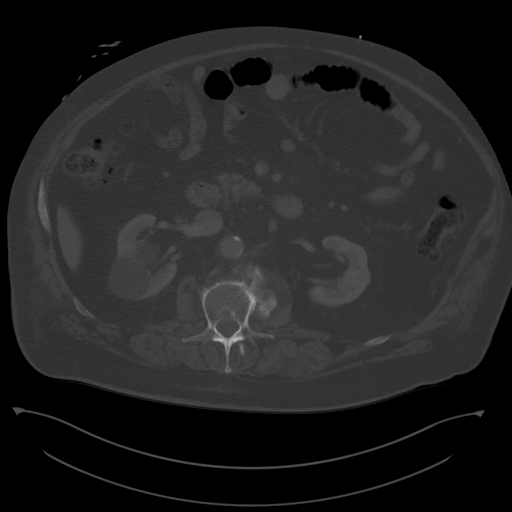
[im 71/99  soft-tissue]
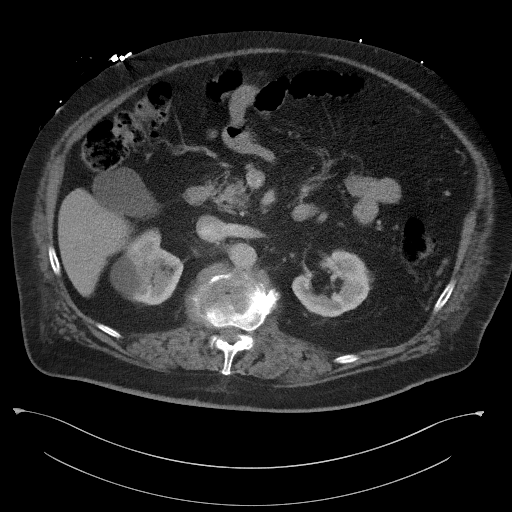
[im 80/99  soft-tissue]
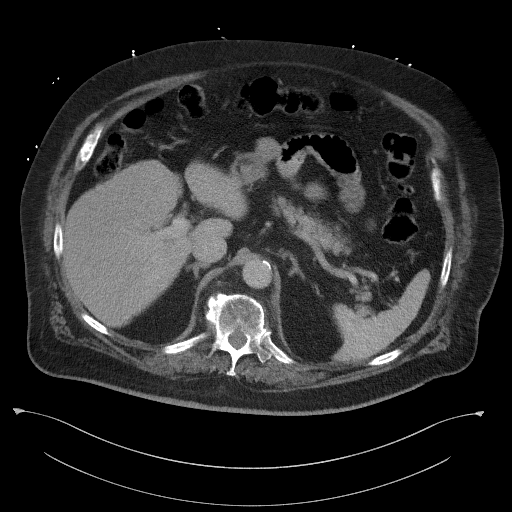
[im 85/99  soft-tissue]
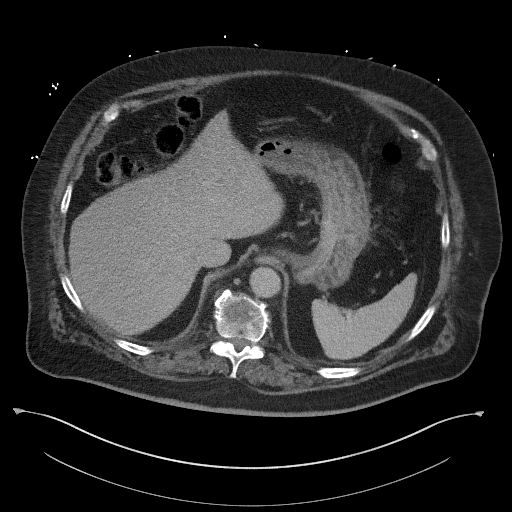
[im 94/99  soft-tissue]
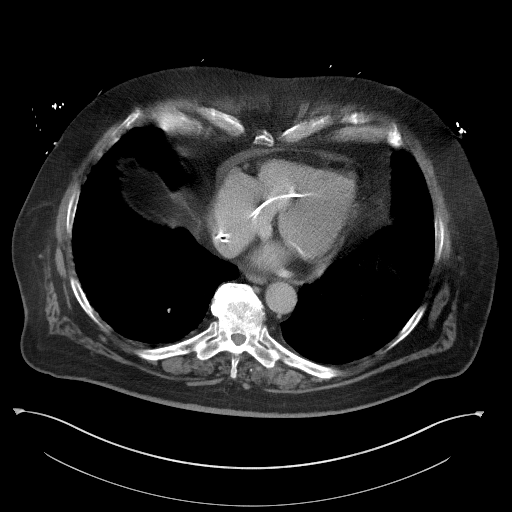

[Series 5: coronal st · coronal · 0.96mm/px · 3 of 114 slices shown]
[im 38/114  soft-tissue]
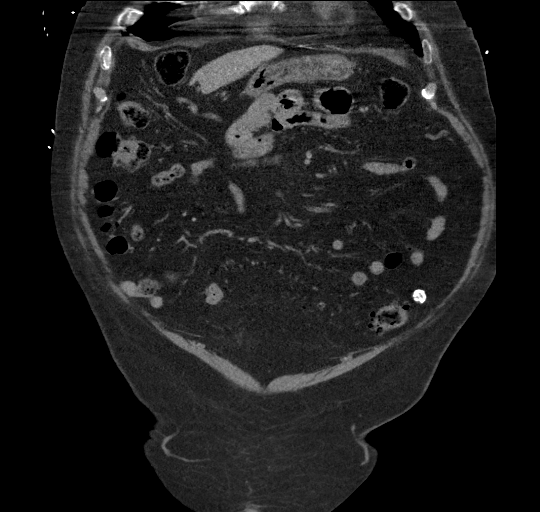
[im 51/114  soft-tissue]
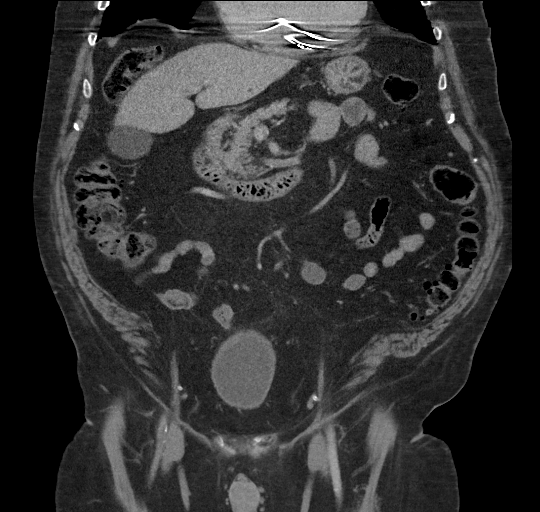
[im 63/114  soft-tissue]
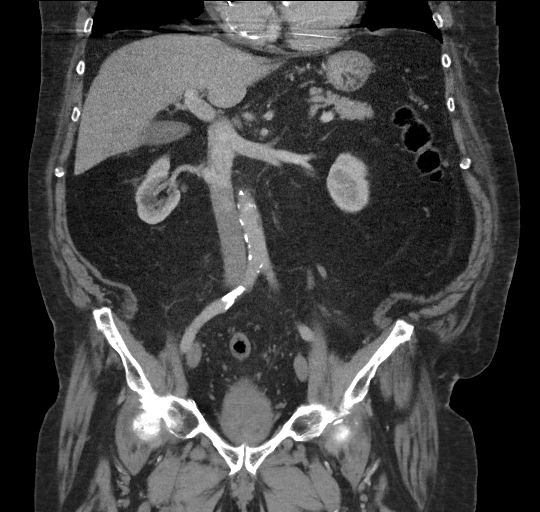

[16 of 46 positions shown; findings below may reference images not displayed]

FINDINGS: Lower chest: Lung bases demonstrate no acute consolidation or
pleural effusion. Small pericardial effusion. Partially visualized
intracardiac pacing leads. Cardiomegaly.

Hepatobiliary: Subcentimeter hypodensity within the left hepatic
lobe, too small to further characterize. No biliary dilatation.
Possible small amount of hyperdense sludge in the gallbladder.

Pancreas: Unremarkable. No pancreatic ductal dilatation or
surrounding inflammatory changes.

Spleen: Normal in size without focal abnormality.

Adrenals/Urinary Tract: Adrenal glands are normal. No
hydronephrosis. Bilobed or 2 adjacent cysts in the mid right kidney
measuring 6.1 cm. The urinary bladder is unremarkable

Stomach/Bowel: Stomach is within normal limits. Appendix appears
normal. No evidence of bowel wall thickening, distention, or
inflammatory changes. Sigmoid colon diverticular disease without
acute inflammatory process.

Vascular/Lymphatic: Moderate aortic atherosclerosis. No aneurysm. No
significantly enlarged lymph nodes.

Reproductive: Enlarged heterogeneous prostate

Other: Negative for free air or free fluid further coarse
calcification of left lower quadrant omental nodule.

Musculoskeletal: Degenerative changes of the spine without acute or
suspicious abnormality.
IMPRESSION: 1. No CT evidence for acute intra-abdominal or pelvic abnormality.
Possible small amount of hyperdense sludge in the gallbladder
2. Sigmoid colon diverticular disease without acute inflammatory
process
3. Prostatomegaly
4. Small pericardial effusion

## 2019-07-31 IMAGING — DX DG HIP (WITH OR WITHOUT PELVIS) 5+V BILAT
6 series · 6 of 6 positions shown · non-contrast
Comparison: None.

CLINICAL DATA: Bilateral hip pain

EXAM:
DG HIP (WITH OR WITHOUT PELVIS) 5+V BILAT

[pelvis ap (1 of 2)]
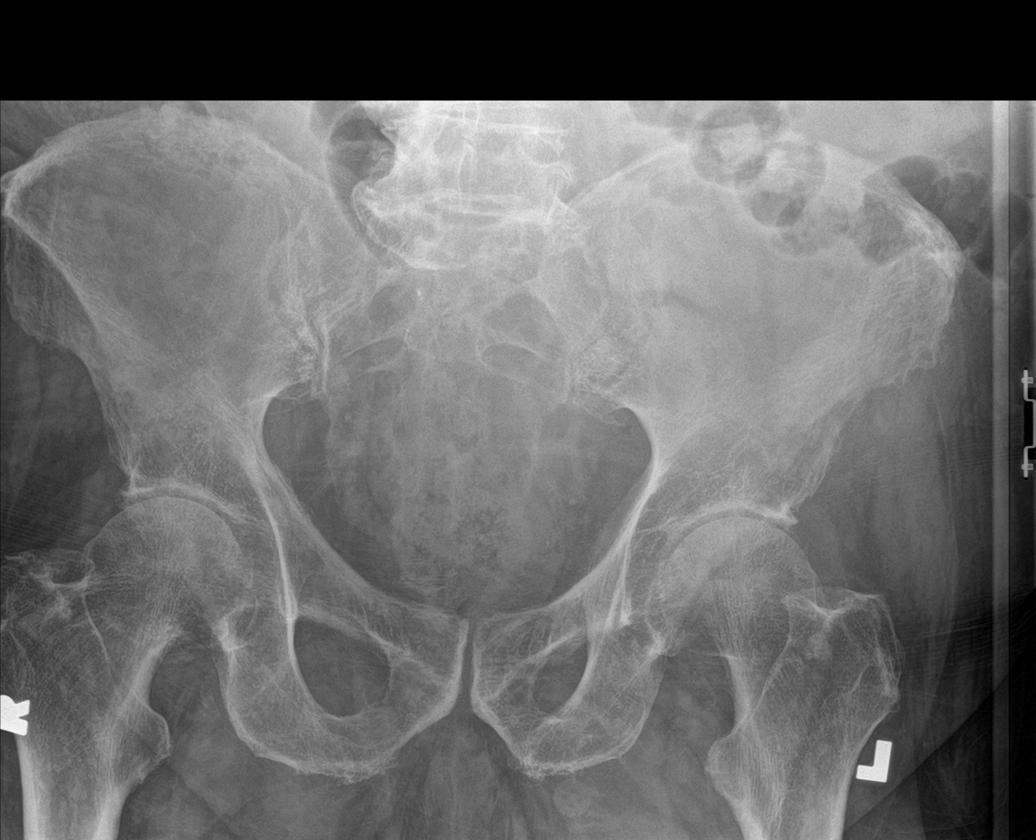

[hip ap (1 of 2)]
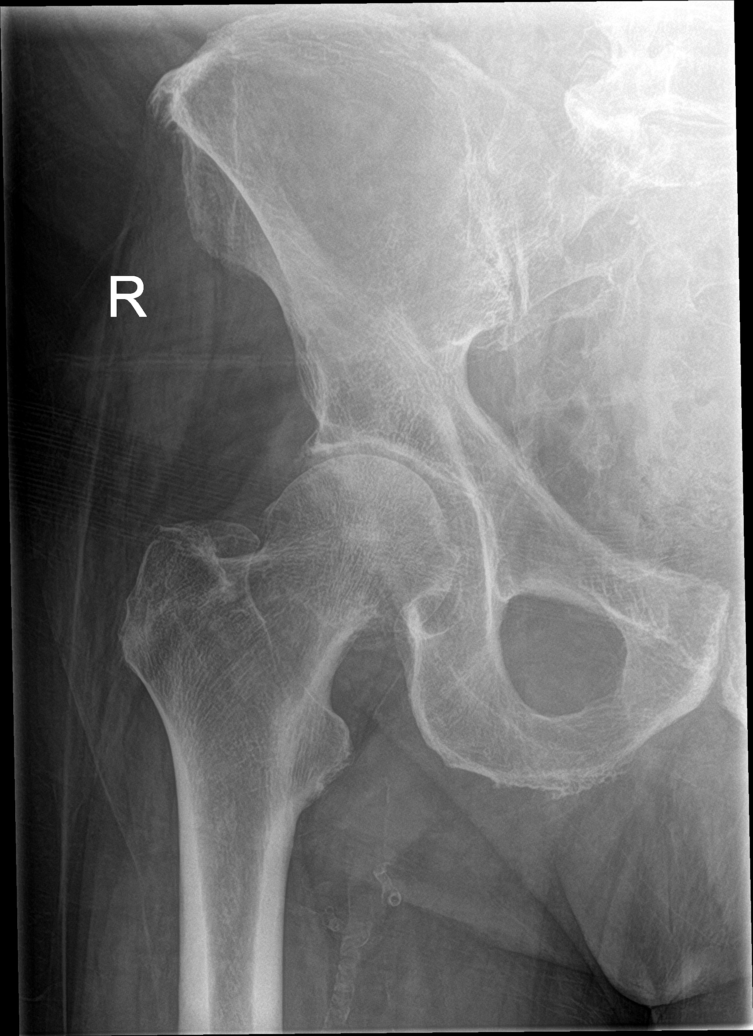

[hip lat (1 of 2)]
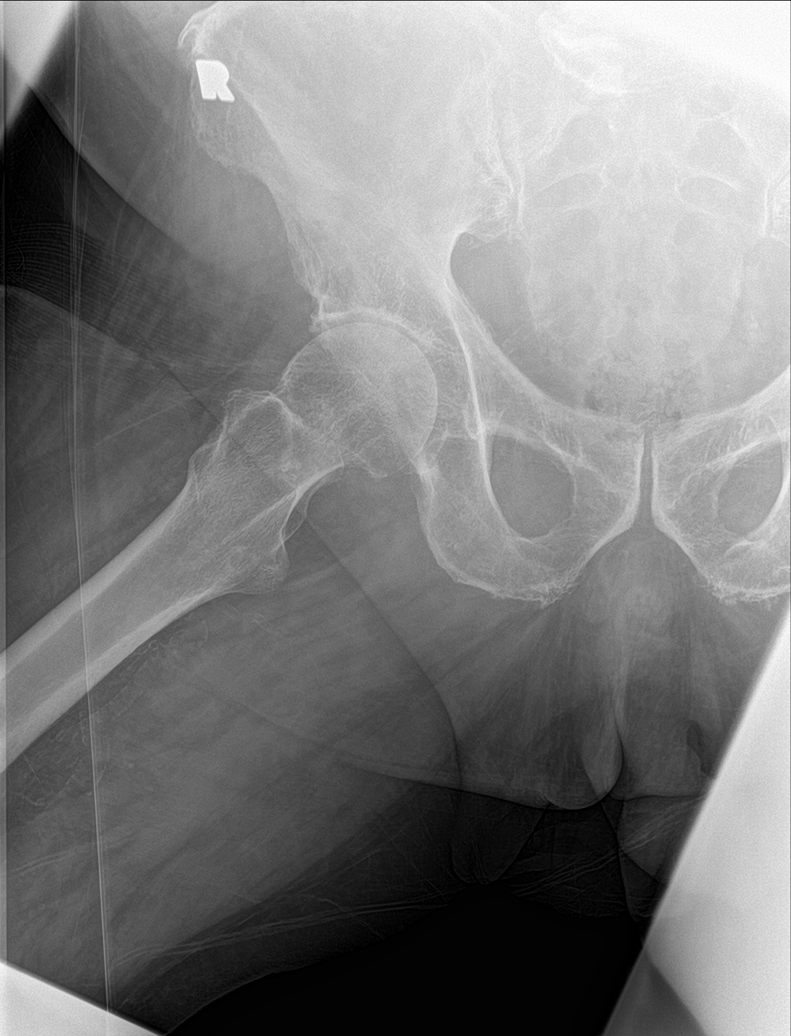

[hip ap (2 of 2)]
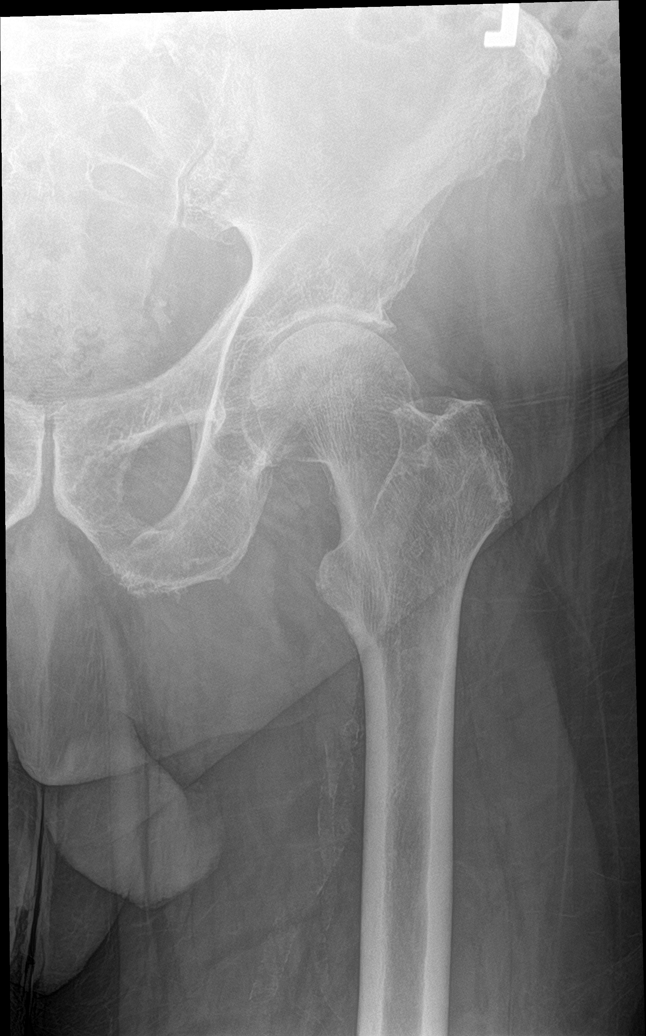

[hip lat (2 of 2)]
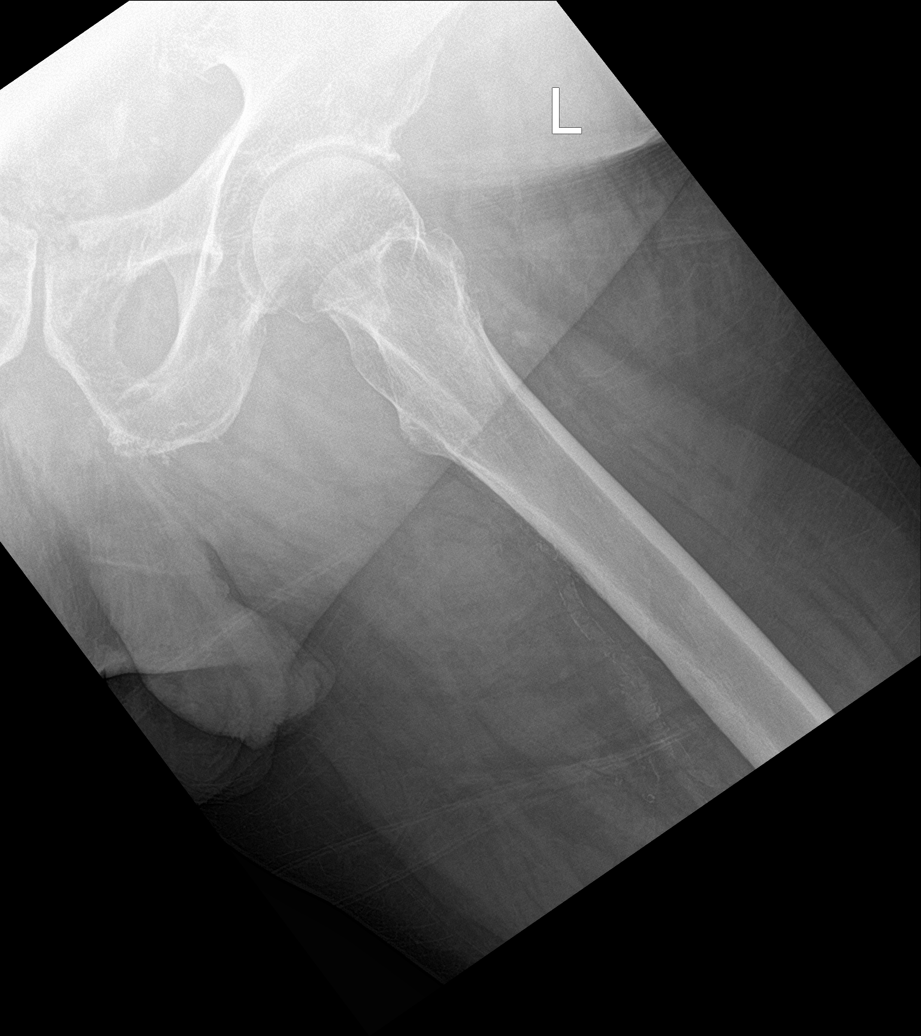

[pelvis ap (2 of 2)]
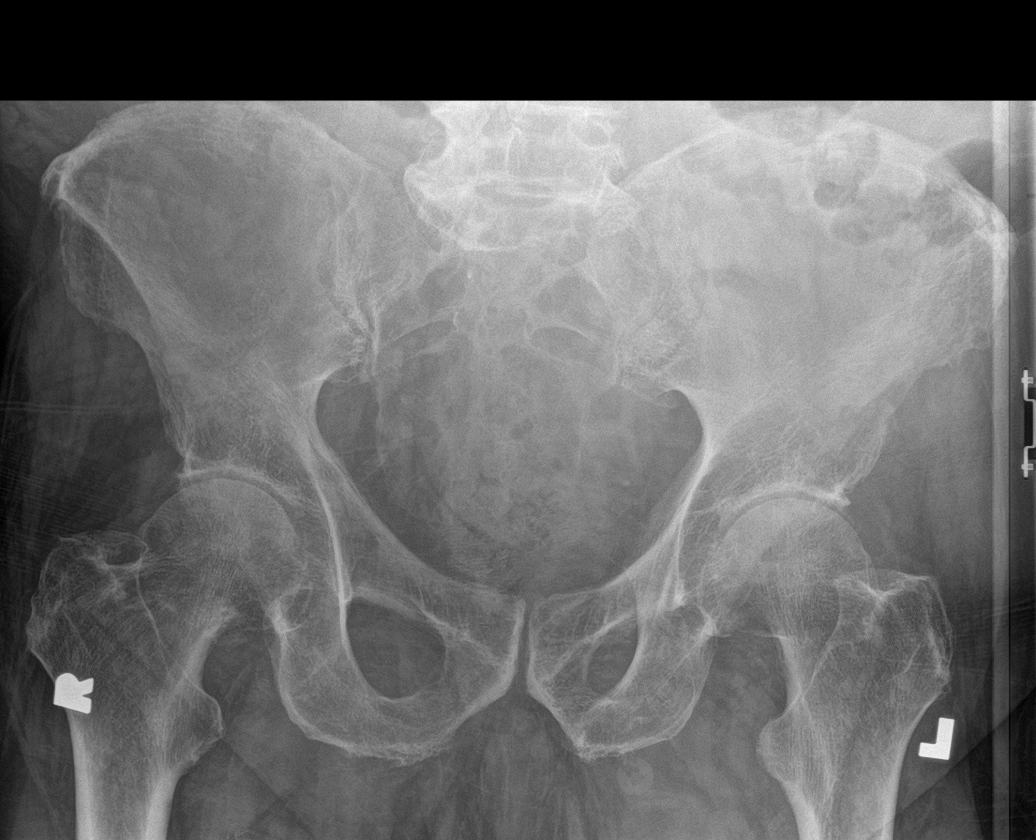

[6 of 6 positions shown; findings below may reference images not displayed]

FINDINGS: No acute displaced fracture or malalignment. Pubic symphysis and
rami are intact. Moderate arthritis of the bilateral hips. Vascular
calcifications.
IMPRESSION: Moderate arthritis.  No definite acute osseous abnormality.

## 2019-08-01 ENCOUNTER — Other Ambulatory Visit: Payer: Self-pay

## 2019-08-01 ENCOUNTER — Ambulatory Visit (INDEPENDENT_AMBULATORY_CARE_PROVIDER_SITE_OTHER): Payer: Medicare Other | Admitting: *Deleted

## 2019-08-01 DIAGNOSIS — Z5181 Encounter for therapeutic drug level monitoring: Secondary | ICD-10-CM | POA: Diagnosis not present

## 2019-08-01 DIAGNOSIS — I4819 Other persistent atrial fibrillation: Secondary | ICD-10-CM

## 2019-08-01 LAB — POCT INR: INR: 3.2 — AB (ref 2.0–3.0)

## 2019-08-01 NOTE — Patient Instructions (Signed)
Started on hydrocodone 2 x daily for back pain Hold coumadin tonight then decrease dose to 1/2 tablet daily Recheck INR in 6 weeks

## 2019-08-06 ENCOUNTER — Other Ambulatory Visit: Payer: Self-pay | Admitting: Dermatology

## 2019-08-06 DIAGNOSIS — D044 Carcinoma in situ of skin of scalp and neck: Secondary | ICD-10-CM | POA: Diagnosis not present

## 2019-08-06 DIAGNOSIS — C44329 Squamous cell carcinoma of skin of other parts of face: Secondary | ICD-10-CM | POA: Diagnosis not present

## 2019-08-17 ENCOUNTER — Other Ambulatory Visit: Payer: Self-pay | Admitting: Cardiology

## 2019-08-21 DIAGNOSIS — H52223 Regular astigmatism, bilateral: Secondary | ICD-10-CM | POA: Diagnosis not present

## 2019-08-24 ENCOUNTER — Other Ambulatory Visit: Payer: Self-pay | Admitting: Internal Medicine

## 2019-08-27 ENCOUNTER — Telehealth: Payer: Self-pay | Admitting: *Deleted

## 2019-08-27 MED ORDER — IRBESARTAN 300 MG PO TABS: 150.0000 mg | ORAL_TABLET | Freq: Every day | ORAL | 1 refills | Status: DC

## 2019-08-27 NOTE — Telephone Encounter (Signed)
REFILL 

## 2019-09-03 ENCOUNTER — Telehealth: Payer: Self-pay | Admitting: Pharmacist

## 2019-09-03 NOTE — Telephone Encounter (Signed)
Called pt to discuss changing from warfarin to [place] due to better efficacy and safety data, as well as less frequent monitoring, especially given COVID-19 pandemic.   Spoke with patient's daughter, she will discuss with pt. Pt sees Dr Caryl Comes tomorrow and will discuss.

## 2019-09-05 ENCOUNTER — Encounter: Payer: Self-pay | Admitting: Internal Medicine

## 2019-09-05 ENCOUNTER — Ambulatory Visit (INDEPENDENT_AMBULATORY_CARE_PROVIDER_SITE_OTHER): Payer: Medicare Other | Admitting: Internal Medicine

## 2019-09-05 ENCOUNTER — Other Ambulatory Visit: Payer: Self-pay

## 2019-09-05 VITALS — BP 144/72 | HR 61 | Ht 69.5 in | Wt 239.0 lb

## 2019-09-05 DIAGNOSIS — Z79899 Other long term (current) drug therapy: Secondary | ICD-10-CM

## 2019-09-05 DIAGNOSIS — I4819 Other persistent atrial fibrillation: Secondary | ICD-10-CM | POA: Diagnosis not present

## 2019-09-05 DIAGNOSIS — I495 Sick sinus syndrome: Secondary | ICD-10-CM | POA: Diagnosis not present

## 2019-09-05 DIAGNOSIS — I10 Essential (primary) hypertension: Secondary | ICD-10-CM

## 2019-09-05 DIAGNOSIS — Z950 Presence of cardiac pacemaker: Secondary | ICD-10-CM

## 2019-09-05 NOTE — Patient Instructions (Addendum)
Medication Instructions:  Your physician recommends that you continue on your current medications as directed. Please refer to the Current Medication list given to you today.  *If you need a refill on your cardiac medications before your next appointment, please call your pharmacy*  Labwork: Today: BMET, & Magnesium level  Testing/Procedures: None ordered  Follow-Up: Remote monitoring is used to monitor your Pacemaker or ICD from home. This monitoring reduces the number of office visits required to check your device to one time per year. It allows [place] to keep an eye on the functioning of your device to ensure it is working properly. You are scheduled for a device check from home on 09/26/2019. You may send your transmission at any time that day. If you have a wireless device, the transmission will be sent automatically. After your physician reviews your transmission, you will receive a postcard with your next transmission date.  At [facility], you and your health needs are our priority.  As part of our continuing mission to provide you with exceptional heart care, we have created designated Provider Care Teams.  These Care Teams include your primary Cardiologist (physician) and Advanced Practice Providers (APPs -  Physician Assistants and Nurse Practitioners) who all work together to provide you with the care you need, when you need it.  You will need a follow up appointment in 12 months.  Please call our office 2 months in advance to schedule this appointment.  You may see Dr Caryl Comes or one of the following Advanced Practice Providers on your designated Care Team:    Chanetta Marshall, NP  Tommye Standard, PA-C  [place], [place]  Thank you for choosing [facility]!!   Any Other Special Instructions Will Be Listed Below (If Applicable).  Please call your insurance and check to see how much each will cost you.  Call the office if any of the medications will be affordable and we can make the  switch from Coumadin.  Eliquis Xarelto Pradaxa

## 2019-09-05 NOTE — Progress Notes (Signed)
Patient Care Team: [name], MD as PCP - General (Family Medicine)   HPI  Chad [name] is a 85 y.o. male Seen in follow-up for pacing undertaken 2018 for sinus node dysfunction.  He also has atrial fibrillation nonsustained ventricular tachycardia; anticoagulated with warfarin.  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  Previously on warfarin   No bleeding  Stable shortness of breath no chest pain no edema  Wife of 34 years died is being very tomorrow.  Her name is Chad [name].  Younger sister of his buddies   DATE TEST EF   1/15 Echo   60-65 %           Records and Results Reviewed labs from ER vsist  Date Cr K Mg Hgb  7/18  1.16 4.5 2.2 15.2  11/18  1.06 4.5 2.3   3/20 1.12 4.4  14.0      He is Past Medical History:  Diagnosis Date  . Bladder cancer ([place]) 2010  . Chronic bronchitis ([place])   . Hypertension   . Hypoglycemia   . On home oxygen therapy    "2L w/CPAP at night" (06/21/2017)  . OSA on CPAP    "w/2L O2 at night" (06/21/2017)  . Persistent atrial fibrillation ([place])       . Pneumonia    "when he was young; again in 2016" (06/21/2017)  . Presence of permanent cardiac pacemaker   . Sensory ataxia     Past Surgical History:  Procedure Laterality Date  . BACK SURGERY    . CARDIOVERSION N/A 04/23/2015   Procedure: CARDIOVERSION;  Surgeon: [name], MD;  Location: [facility];  Service: Cardiovascular;  Laterality: N/A;  . CARDIOVERSION N/A 07/30/2015   Procedure: CARDIOVERSION;  Surgeon: [name], MD;  Location: [facility] ENDOSCOPY;  Service: Cardiovascular;  Laterality: N/A;  . EYE SURGERY Bilateral 2009   "to lower pressure; no glaucoma" (06/21/2017)  . INSERT / REPLACE / REMOVE PACEMAKER  06/21/2017  . [place]  . PACEMAKER IMPLANT N/A 06/21/2017   Procedure: Pacemaker Implant;  Surgeon: Deboraha Sprang, MD;  Location: [place] CV LAB;  Service: Cardiovascular;  Laterality: N/A;  . TRANSURETHRAL RESECTION OF  BLADDER TUMOR WITH GYRUS (TURBT-GYRUS)  07/10/2009    Cystourethroscopy, gyrus TURBT (transurethral  resection of bladder tumor).[name] 03/16/2011    Current Outpatient Medications  Medication Sig Dispense Refill  . Calcium-Magnesium 500-250 MG TABS Take 1 tablet by mouth daily.      . Cholecalciferol (VITAMIN D3) 2000 units capsule Take 2,000 Units by mouth 2 (two) times daily.    . Coenzyme Q10 (EQL COQ10) 100 MG capsule Take 100 mg by mouth daily.     Marland Kitchen dofetilide (TIKOSYN) 250 MCG capsule Take 1 capsule by mouth twice daily 180 capsule 3  . escitalopram (LEXAPRO) 10 MG tablet Take 10 mg by mouth at bedtime.     . furosemide (LASIX) 20 MG tablet TAKE 1 TABLET BY MOUTH ONCE DAILY AS NEEDED FOR  FLUID  OR  EDEMA 90 tablet 3  . HYDROcodone-acetaminophen (NORCO) 10-325 MG tablet Take 1-2 tablets by mouth 4 (four) times daily as needed.    . irbesartan (AVAPRO) 300 MG tablet Take 0.5 tablets (150 mg total) by mouth daily. 45 tablet 1  . metoprolol succinate (TOPROL-XL) 50 MG 24 hr tablet TAKE 2 TABLETS BY MOUTH ONCE DAILY IN THE MORNING AND 1 IN THE EVENING. Please keep upcoming  appt in October for future refills. Thank you 270 tablet 0  . Multiple Vitamins-Minerals (CENTRUM SILVER PO) Take 1 tablet by mouth daily.      . OXYGEN Inhale 2 L into the lungs at bedtime. Uses with CPAP machine    . triamcinolone cream (KENALOG) 0.1 % APPLY TOPICALLY TO AFFECTED AREA(S) AFTER BATHING    . vitamin B-12 (CYANOCOBALAMIN) 500 MCG tablet Take 500 mcg by mouth 2 (two) times daily.    Marland Kitchen warfarin (COUMADIN) 5 MG tablet TAKE 1/2 TO 1 (ONE-HALF TO ONE) TABLET BY MOUTH ONCE DAILY AS DIRECTED BY COUMADIN CLINIC 90 tablet 0   No current facility-administered medications for this visit.     Allergies  Allergen Reactions  . Carbapenems   . Cephalosporins Other (See Comments)  . Chlorpheniramine Other (See Comments)    Swells prostate  . Ciprofloxacin     Pt can not take with amiodarone   . Decongestant  [Oxymetazoline]     All decongestant - makes prostate swell  . Diltiazem Swelling    Left leg swelling  . Flagyl [Metronidazole] Nausea Only    Stomach ache  . Hctz [Hydrochlorothiazide]   . Levaquin [Levofloxacin In D5w] Other (See Comments)    unknown  . Levofloxacin Nausea Only  . Lidocaine     Pt unsure of  . Penicillins Swelling    Has patient had a PCN reaction causing immediate rash, facial/tongue/throat swelling, SOB or lightheadedness with hypotension: No Has patient had a PCN reaction causing severe rash involving mucus membranes or skin necrosis: No Has patient had a PCN reaction that required hospitalization: No Has patient had a PCN reaction occurring within the last 10 years: No If all of the above answers are "NO", then may proceed with Cephalosporin use. Swelling at injection site  . Teline [Tetracycline] Swelling    Hospitalization for 2 weeks, prostate swelling  . Tetanus Toxoid Swelling    Swells arms, Please pre-medicate with benadryl.  Marland Kitchen Z-Pak [Azithromycin] Other (See Comments)    Pt can not take with Amiodarone   . Sulfonamide Derivatives Rash  . Toprol Xl [Metoprolol Tartrate] Itching and Rash    Pt states he tolerates.      Review of Systems negative except from HPI and PMH  Physical Exam BP (!) 144/72   Pulse 61   Ht 5' 9.5" (1.765 m)   Wt 239 lb (108.4 kg)   SpO2 95%   BMI 34.79 kg/m  Well developed and well nourished in no acute distress HENT normal Neck supple with JVP-flat Clear Device pocket well healed; without hematoma or erythema.  There is no tethering  Regular rate and rhythm, no  gallop N murmur Abd-soft with active BS No Clubbing cyanosis   edema Skin-warm and dry A & Oriented  Grossly normal sensory and motor function  ECG atrial pacing at 64 Intervals 26/12/42 RSR prime    Assessment and  Plan  VT-NS  Atrial fib  RVR  Sinus bradycardia  Pacemaker /St Jude   Morbidly obese  OSA treated  HFpEF  SVT   Neuropathy  Intermittent SVT asymptomatic  No Afib  On Anticoagulation;  No bleeding issues   Discussed changing to NOAC<  Will Rx apixoban  5 mg bid  Euvolemic continue current meds  disucssed the loss of his wife and grieving   We spent more than 50% of our >25 min visit in face to face counseling regarding the above       Current medicines  are reviewed at length with the patient today .  The patient does not  have concerns regarding medicines.

## 2019-09-06 ENCOUNTER — Telehealth: Payer: Self-pay

## 2019-09-06 LAB — MAGNESIUM: Magnesium: 2.3 mg/dL (ref 1.6–2.3)

## 2019-09-06 LAB — BASIC METABOLIC PANEL
BUN/Creatinine Ratio: 10 (ref 10–24)
BUN: 11 mg/dL (ref 8–27)
CO2: 29 mmol/L (ref 20–29)
Calcium: 9.8 mg/dL (ref 8.6–10.2)
Chloride: 102 mmol/L (ref 96–106)
Creatinine, Ser: 1.11 mg/dL (ref 0.76–1.27)
GFR calc Af Amer: 68 mL/min/{1.73_m2} (ref >59)
GFR calc non Af Amer: 59 mL/min/{1.73_m2} — ABNORMAL LOW (ref >59)
Glucose: 80 mg/dL (ref 65–99)
Potassium: 4.4 mmol/L (ref 3.5–5.2)
Sodium: 142 mmol/L (ref 134–144)

## 2019-09-06 NOTE — Telephone Encounter (Signed)
LVM for patient to return call regarding medication question prior to appointment next week. Asking if he switched to Eliquis after seeing Dr. Olin Pia on 10/22. There was no rx sent in but the change was mentioned in the note.

## 2019-09-11 NOTE — Telephone Encounter (Signed)
Spoke with pt, states he would prefer to stay on warfarin at this time. States he has a lot going on personally (his wife passed recently) and the [place] copay is too high.

## 2019-09-12 ENCOUNTER — Ambulatory Visit (INDEPENDENT_AMBULATORY_CARE_PROVIDER_SITE_OTHER): Payer: Medicare Other | Admitting: *Deleted

## 2019-09-12 ENCOUNTER — Other Ambulatory Visit: Payer: Self-pay

## 2019-09-12 DIAGNOSIS — I4819 Other persistent atrial fibrillation: Secondary | ICD-10-CM | POA: Diagnosis not present

## 2019-09-12 DIAGNOSIS — Z5181 Encounter for therapeutic drug level monitoring: Secondary | ICD-10-CM

## 2019-09-12 LAB — POCT INR: INR: 1.9 — AB (ref 2.0–3.0)

## 2019-09-12 NOTE — Patient Instructions (Signed)
Started on hydrocodone 2 x daily for back pain Increase warfarin to 1/2 tablet daily except 1 tablet on Thursdays Recheck INR in 6 weeks

## 2019-09-24 DIAGNOSIS — L409 Psoriasis, unspecified: Secondary | ICD-10-CM | POA: Diagnosis not present

## 2019-09-24 DIAGNOSIS — M150 Primary generalized (osteo)arthritis: Secondary | ICD-10-CM | POA: Diagnosis not present

## 2019-09-24 DIAGNOSIS — Z6834 Body mass index (BMI) 34.0-34.9, adult: Secondary | ICD-10-CM | POA: Diagnosis not present

## 2019-09-24 DIAGNOSIS — M545 Low back pain: Secondary | ICD-10-CM | POA: Diagnosis not present

## 2019-09-26 ENCOUNTER — Ambulatory Visit (INDEPENDENT_AMBULATORY_CARE_PROVIDER_SITE_OTHER): Payer: Medicare Other | Admitting: *Deleted

## 2019-09-26 DIAGNOSIS — I495 Sick sinus syndrome: Secondary | ICD-10-CM

## 2019-09-26 DIAGNOSIS — I4819 Other persistent atrial fibrillation: Secondary | ICD-10-CM

## 2019-09-27 LAB — CUP PACEART REMOTE DEVICE CHECK
Battery Remaining Longevity: 114 mo
Battery Remaining Percentage: 95.5 %
Battery Voltage: 3.01 V
Brady Statistic AP VP Percent: 8.1 %
Brady Statistic AP VS Percent: 76 %
Brady Statistic AS VP Percent: 1.1 %
Brady Statistic AS VS Percent: 15 %
Brady Statistic RA Percent Paced: 81 %
Brady Statistic RV Percent Paced: 9.1 %
Date Time Interrogation Session: 20201113091844
Implantable Lead Implant Date: 20180808
Implantable Lead Implant Date: 20180808
Implantable Lead Location: 753859
Implantable Lead Location: 753860
Implantable Lead Model: 5076
Implantable Lead Model: 5076
Implantable Pulse Generator Implant Date: 20180808
Lead Channel Impedance Value: 450 Ohm
Lead Channel Impedance Value: 480 Ohm
Lead Channel Pacing Threshold Amplitude: 0.5 V
Lead Channel Pacing Threshold Amplitude: 0.75 V
Lead Channel Pacing Threshold Pulse Width: 0.5 ms
Lead Channel Pacing Threshold Pulse Width: 0.5 ms
Lead Channel Sensing Intrinsic Amplitude: 12 mV
Lead Channel Sensing Intrinsic Amplitude: 3.1 mV
Lead Channel Setting Pacing Amplitude: 2 V
Lead Channel Setting Pacing Amplitude: 2.5 V
Lead Channel Setting Pacing Pulse Width: 0.5 ms
Lead Channel Setting Sensing Sensitivity: 2 mV
Pulse Gen Model: 2272
Pulse Gen Serial Number: 8932564

## 2019-10-03 DIAGNOSIS — Z6836 Body mass index (BMI) 36.0-36.9, adult: Secondary | ICD-10-CM | POA: Diagnosis not present

## 2019-10-03 DIAGNOSIS — G894 Chronic pain syndrome: Secondary | ICD-10-CM | POA: Diagnosis not present

## 2019-10-03 DIAGNOSIS — C670 Malignant neoplasm of trigone of bladder: Secondary | ICD-10-CM | POA: Diagnosis not present

## 2019-10-03 DIAGNOSIS — M1991 Primary osteoarthritis, unspecified site: Secondary | ICD-10-CM | POA: Diagnosis not present

## 2019-10-08 ENCOUNTER — Ambulatory Visit (INDEPENDENT_AMBULATORY_CARE_PROVIDER_SITE_OTHER): Payer: Medicare Other | Admitting: *Deleted

## 2019-10-08 ENCOUNTER — Other Ambulatory Visit: Payer: Self-pay

## 2019-10-08 DIAGNOSIS — Z5181 Encounter for therapeutic drug level monitoring: Secondary | ICD-10-CM | POA: Diagnosis not present

## 2019-10-08 DIAGNOSIS — I4819 Other persistent atrial fibrillation: Secondary | ICD-10-CM

## 2019-10-08 LAB — POCT INR: INR: 2.9 (ref 2.0–3.0)

## 2019-10-08 NOTE — Patient Instructions (Signed)
On hydrocodone 1 x daily for back pain Continue warfarin 1/2 tablet daily except 1 tablet on Thursdays Recheck INR in 6 weeks

## 2019-10-14 DIAGNOSIS — N400 Enlarged prostate without lower urinary tract symptoms: Secondary | ICD-10-CM | POA: Diagnosis not present

## 2019-10-14 DIAGNOSIS — Z8551 Personal history of malignant neoplasm of bladder: Secondary | ICD-10-CM | POA: Diagnosis not present

## 2019-10-14 DIAGNOSIS — Z950 Presence of cardiac pacemaker: Secondary | ICD-10-CM | POA: Diagnosis not present

## 2019-10-14 DIAGNOSIS — I4891 Unspecified atrial fibrillation: Secondary | ICD-10-CM | POA: Diagnosis not present

## 2019-10-14 DIAGNOSIS — I129 Hypertensive chronic kidney disease with stage 1 through stage 4 chronic kidney disease, or unspecified chronic kidney disease: Secondary | ICD-10-CM | POA: Diagnosis not present

## 2019-10-14 DIAGNOSIS — Z9981 Dependence on supplemental oxygen: Secondary | ICD-10-CM | POA: Diagnosis not present

## 2019-10-14 DIAGNOSIS — Z9181 History of falling: Secondary | ICD-10-CM | POA: Diagnosis not present

## 2019-10-14 DIAGNOSIS — Z7901 Long term (current) use of anticoagulants: Secondary | ICD-10-CM | POA: Diagnosis not present

## 2019-10-14 DIAGNOSIS — M150 Primary generalized (osteo)arthritis: Secondary | ICD-10-CM | POA: Diagnosis not present

## 2019-10-14 DIAGNOSIS — G4730 Sleep apnea, unspecified: Secondary | ICD-10-CM | POA: Diagnosis not present

## 2019-10-14 DIAGNOSIS — G894 Chronic pain syndrome: Secondary | ICD-10-CM | POA: Diagnosis not present

## 2019-10-14 DIAGNOSIS — N183 Chronic kidney disease, stage 3 unspecified: Secondary | ICD-10-CM | POA: Diagnosis not present

## 2019-10-14 DIAGNOSIS — E669 Obesity, unspecified: Secondary | ICD-10-CM | POA: Diagnosis not present

## 2019-10-14 DIAGNOSIS — M47816 Spondylosis without myelopathy or radiculopathy, lumbar region: Secondary | ICD-10-CM | POA: Diagnosis not present

## 2019-10-14 DIAGNOSIS — E1122 Type 2 diabetes mellitus with diabetic chronic kidney disease: Secondary | ICD-10-CM | POA: Diagnosis not present

## 2019-10-14 DIAGNOSIS — E785 Hyperlipidemia, unspecified: Secondary | ICD-10-CM | POA: Diagnosis not present

## 2019-10-17 NOTE — Progress Notes (Signed)
Remote pacemaker transmission.   

## 2019-10-18 ENCOUNTER — Other Ambulatory Visit: Payer: Self-pay | Admitting: Internal Medicine

## 2019-10-21 DIAGNOSIS — I4891 Unspecified atrial fibrillation: Secondary | ICD-10-CM | POA: Diagnosis not present

## 2019-10-21 DIAGNOSIS — M47816 Spondylosis without myelopathy or radiculopathy, lumbar region: Secondary | ICD-10-CM | POA: Diagnosis not present

## 2019-10-21 DIAGNOSIS — E1122 Type 2 diabetes mellitus with diabetic chronic kidney disease: Secondary | ICD-10-CM | POA: Diagnosis not present

## 2019-10-21 DIAGNOSIS — G894 Chronic pain syndrome: Secondary | ICD-10-CM | POA: Diagnosis not present

## 2019-10-23 ENCOUNTER — Other Ambulatory Visit: Payer: Self-pay | Admitting: Dermatology

## 2019-10-23 DIAGNOSIS — D485 Neoplasm of uncertain behavior of skin: Secondary | ICD-10-CM | POA: Diagnosis not present

## 2019-10-23 DIAGNOSIS — L570 Actinic keratosis: Secondary | ICD-10-CM | POA: Diagnosis not present

## 2019-10-23 DIAGNOSIS — D0439 Carcinoma in situ of skin of other parts of face: Secondary | ICD-10-CM | POA: Diagnosis not present

## 2019-10-23 DIAGNOSIS — D2222 Melanocytic nevi of left ear and external auricular canal: Secondary | ICD-10-CM | POA: Diagnosis not present

## 2019-11-18 DIAGNOSIS — Z8551 Personal history of malignant neoplasm of bladder: Secondary | ICD-10-CM | POA: Diagnosis not present

## 2019-11-18 DIAGNOSIS — E785 Hyperlipidemia, unspecified: Secondary | ICD-10-CM | POA: Diagnosis not present

## 2019-11-18 DIAGNOSIS — G4730 Sleep apnea, unspecified: Secondary | ICD-10-CM | POA: Diagnosis not present

## 2019-11-18 DIAGNOSIS — N183 Chronic kidney disease, stage 3 unspecified: Secondary | ICD-10-CM | POA: Diagnosis not present

## 2019-11-18 DIAGNOSIS — E669 Obesity, unspecified: Secondary | ICD-10-CM | POA: Diagnosis not present

## 2019-11-18 DIAGNOSIS — Z7901 Long term (current) use of anticoagulants: Secondary | ICD-10-CM | POA: Diagnosis not present

## 2019-11-18 DIAGNOSIS — I4891 Unspecified atrial fibrillation: Secondary | ICD-10-CM | POA: Diagnosis not present

## 2019-11-18 DIAGNOSIS — M150 Primary generalized (osteo)arthritis: Secondary | ICD-10-CM | POA: Diagnosis not present

## 2019-11-18 DIAGNOSIS — N400 Enlarged prostate without lower urinary tract symptoms: Secondary | ICD-10-CM | POA: Diagnosis not present

## 2019-11-18 DIAGNOSIS — M47816 Spondylosis without myelopathy or radiculopathy, lumbar region: Secondary | ICD-10-CM | POA: Diagnosis not present

## 2019-11-18 DIAGNOSIS — Z9981 Dependence on supplemental oxygen: Secondary | ICD-10-CM | POA: Diagnosis not present

## 2019-11-18 DIAGNOSIS — I129 Hypertensive chronic kidney disease with stage 1 through stage 4 chronic kidney disease, or unspecified chronic kidney disease: Secondary | ICD-10-CM | POA: Diagnosis not present

## 2019-11-18 DIAGNOSIS — G894 Chronic pain syndrome: Secondary | ICD-10-CM | POA: Diagnosis not present

## 2019-11-18 DIAGNOSIS — Z9181 History of falling: Secondary | ICD-10-CM | POA: Diagnosis not present

## 2019-11-18 DIAGNOSIS — Z950 Presence of cardiac pacemaker: Secondary | ICD-10-CM | POA: Diagnosis not present

## 2019-11-18 DIAGNOSIS — E1122 Type 2 diabetes mellitus with diabetic chronic kidney disease: Secondary | ICD-10-CM | POA: Diagnosis not present

## 2019-11-19 ENCOUNTER — Other Ambulatory Visit: Payer: Self-pay

## 2019-11-19 ENCOUNTER — Ambulatory Visit (INDEPENDENT_AMBULATORY_CARE_PROVIDER_SITE_OTHER): Payer: Medicare Other | Admitting: *Deleted

## 2019-11-19 DIAGNOSIS — I4819 Other persistent atrial fibrillation: Secondary | ICD-10-CM

## 2019-11-19 DIAGNOSIS — Z5181 Encounter for therapeutic drug level monitoring: Secondary | ICD-10-CM | POA: Diagnosis not present

## 2019-11-19 LAB — POCT INR: INR: 2.1 (ref 2.0–3.0)

## 2019-11-19 NOTE — Patient Instructions (Signed)
On hydrocodone 1 x daily for back pain Continue warfarin 1/2 tablet daily except 1 tablet on Thursdays Recheck INR in 6 weeks

## 2019-11-21 ENCOUNTER — Telehealth: Payer: Self-pay

## 2019-11-21 NOTE — Telephone Encounter (Signed)
The pt daughter wants to know if it is safe for the pt to use a massager with his ppm. She also wanted to know if it is okay for him to go to get a massage and use accupunture? I gave her the number to [place] to get her questions answered.

## 2019-11-22 ENCOUNTER — Other Ambulatory Visit: Payer: Self-pay | Admitting: Internal Medicine

## 2019-11-22 ENCOUNTER — Other Ambulatory Visit: Payer: Self-pay | Admitting: Cardiology

## 2019-11-25 ENCOUNTER — Telehealth: Payer: Self-pay | Admitting: Cardiology

## 2019-11-25 NOTE — Telephone Encounter (Signed)
  Daughter is calling to ask Dr Percival Spanish if her dad is okay to get the covid vaccine because he has a long list of allergies. I did advise her of our recommendation but she wanted to check about an allergy interaction.

## 2019-11-25 NOTE — Telephone Encounter (Signed)
  We are recommending the COVID-19 vaccine to all of our patients. Cardiac medications (including blood thinners) should not deter anyone from being vaccinated and there is no need to hold any of those medications prior to vaccine administration.     Currently, there is a hotline to call (active 11/22/19) to schedule vaccination appointments as no walk-ins will be accepted.   Number: [phone]    If you have further questions or concerns about the vaccine process, please visit [email or web address] or contact your primary care physician.   Advised daughter of instructions

## 2019-11-25 NOTE — Telephone Encounter (Signed)
Lm2cb 

## 2019-11-26 NOTE — Telephone Encounter (Signed)
He is OK to get the vaccine unless he has had anaphylaxis with a previous vaccine.

## 2019-11-26 NOTE — Telephone Encounter (Signed)
LM TO CALL BACK ./CY 

## 2019-11-26 NOTE — Telephone Encounter (Signed)
Pt's daughter aware of the recommendations ./cy

## 2019-12-12 ENCOUNTER — Other Ambulatory Visit: Payer: Self-pay | Admitting: Dermatology

## 2019-12-12 DIAGNOSIS — L570 Actinic keratosis: Secondary | ICD-10-CM | POA: Diagnosis not present

## 2019-12-12 DIAGNOSIS — D2322 Other benign neoplasm of skin of left ear and external auricular canal: Secondary | ICD-10-CM | POA: Diagnosis not present

## 2019-12-12 DIAGNOSIS — D2222 Melanocytic nevi of left ear and external auricular canal: Secondary | ICD-10-CM | POA: Diagnosis not present

## 2019-12-26 ENCOUNTER — Ambulatory Visit (INDEPENDENT_AMBULATORY_CARE_PROVIDER_SITE_OTHER): Payer: Medicare Other | Admitting: *Deleted

## 2019-12-26 DIAGNOSIS — I495 Sick sinus syndrome: Secondary | ICD-10-CM

## 2019-12-27 LAB — CUP PACEART REMOTE DEVICE CHECK
Battery Remaining Longevity: 110 mo
Battery Remaining Percentage: 95.5 %
Battery Voltage: 2.99 V
Brady Statistic AP VP Percent: 25 %
Brady Statistic AP VS Percent: 58 %
Brady Statistic AS VP Percent: 3.3 %
Brady Statistic AS VS Percent: 13 %
Brady Statistic RA Percent Paced: 80 %
Brady Statistic RV Percent Paced: 28 %
Date Time Interrogation Session: 20210212020030
Implantable Lead Implant Date: 20180808
Implantable Lead Implant Date: 20180808
Implantable Lead Location: 753859
Implantable Lead Location: 753860
Implantable Lead Model: 5076
Implantable Lead Model: 5076
Implantable Pulse Generator Implant Date: 20180808
Lead Channel Impedance Value: 450 Ohm
Lead Channel Impedance Value: 480 Ohm
Lead Channel Pacing Threshold Amplitude: 0.5 V
Lead Channel Pacing Threshold Amplitude: 0.75 V
Lead Channel Pacing Threshold Pulse Width: 0.5 ms
Lead Channel Pacing Threshold Pulse Width: 0.5 ms
Lead Channel Sensing Intrinsic Amplitude: 12 mV
Lead Channel Sensing Intrinsic Amplitude: 3.1 mV
Lead Channel Setting Pacing Amplitude: 2 V
Lead Channel Setting Pacing Amplitude: 2.5 V
Lead Channel Setting Pacing Pulse Width: 0.5 ms
Lead Channel Setting Sensing Sensitivity: 2 mV
Pulse Gen Model: 2272
Pulse Gen Serial Number: 8932564

## 2019-12-27 NOTE — Progress Notes (Signed)
PPM Remote  

## 2019-12-31 ENCOUNTER — Other Ambulatory Visit: Payer: Self-pay

## 2019-12-31 ENCOUNTER — Ambulatory Visit (INDEPENDENT_AMBULATORY_CARE_PROVIDER_SITE_OTHER): Payer: Medicare Other | Admitting: *Deleted

## 2019-12-31 DIAGNOSIS — I4819 Other persistent atrial fibrillation: Secondary | ICD-10-CM | POA: Diagnosis not present

## 2019-12-31 DIAGNOSIS — Z5181 Encounter for therapeutic drug level monitoring: Secondary | ICD-10-CM

## 2019-12-31 LAB — POCT INR: INR: 3.3 — AB (ref 2.0–3.0)

## 2019-12-31 NOTE — Patient Instructions (Signed)
Using CBD oil for pain Hold warfarin tonight then resume 1/2 tablet daily except 1 tablet on Thursdays Recheck INR in 4 weeks

## 2020-01-17 ENCOUNTER — Other Ambulatory Visit: Payer: Self-pay | Admitting: Cardiology

## 2020-01-20 NOTE — Telephone Encounter (Signed)
Coumadin has been refilled

## 2020-01-22 ENCOUNTER — Other Ambulatory Visit: Payer: Self-pay

## 2020-01-23 ENCOUNTER — Other Ambulatory Visit: Payer: Self-pay | Admitting: Cardiology

## 2020-01-26 DIAGNOSIS — R109 Unspecified abdominal pain: Secondary | ICD-10-CM | POA: Insufficient documentation

## 2020-01-27 DIAGNOSIS — C672 Malignant neoplasm of lateral wall of bladder: Secondary | ICD-10-CM | POA: Diagnosis not present

## 2020-01-27 DIAGNOSIS — R109 Unspecified abdominal pain: Secondary | ICD-10-CM | POA: Diagnosis not present

## 2020-01-27 DIAGNOSIS — Z86002 Personal history of in-situ neoplasm of other and unspecified genital organs: Secondary | ICD-10-CM | POA: Diagnosis not present

## 2020-01-27 DIAGNOSIS — N281 Cyst of kidney, acquired: Secondary | ICD-10-CM | POA: Diagnosis not present

## 2020-01-27 DIAGNOSIS — Z8551 Personal history of malignant neoplasm of bladder: Secondary | ICD-10-CM | POA: Diagnosis not present

## 2020-01-27 DIAGNOSIS — N400 Enlarged prostate without lower urinary tract symptoms: Secondary | ICD-10-CM | POA: Diagnosis not present

## 2020-01-28 ENCOUNTER — Other Ambulatory Visit: Payer: Self-pay

## 2020-01-28 ENCOUNTER — Ambulatory Visit (INDEPENDENT_AMBULATORY_CARE_PROVIDER_SITE_OTHER): Payer: Medicare Other | Admitting: *Deleted

## 2020-01-28 DIAGNOSIS — Z5181 Encounter for therapeutic drug level monitoring: Secondary | ICD-10-CM | POA: Diagnosis not present

## 2020-01-28 DIAGNOSIS — I4819 Other persistent atrial fibrillation: Secondary | ICD-10-CM

## 2020-01-28 LAB — POCT INR: INR: 3.8 — AB (ref 2.0–3.0)

## 2020-01-28 NOTE — Patient Instructions (Signed)
Using CBD oil for pain Hold warfarin tonight then decrease dose to 1/2 tablet daily  Recheck INR in 3 weeks

## 2020-02-20 ENCOUNTER — Ambulatory Visit (INDEPENDENT_AMBULATORY_CARE_PROVIDER_SITE_OTHER): Payer: Medicare Other | Admitting: *Deleted

## 2020-02-20 ENCOUNTER — Other Ambulatory Visit: Payer: Self-pay

## 2020-02-20 DIAGNOSIS — I4819 Other persistent atrial fibrillation: Secondary | ICD-10-CM

## 2020-02-20 DIAGNOSIS — Z5181 Encounter for therapeutic drug level monitoring: Secondary | ICD-10-CM

## 2020-02-20 LAB — POCT INR: INR: 2.5 (ref 2.0–3.0)

## 2020-02-20 NOTE — Patient Instructions (Signed)
Using CBD oil for pain Continue warfarin 1/2 tablet daily  Recheck INR in 4 weeks 

## 2020-02-24 DIAGNOSIS — I480 Paroxysmal atrial fibrillation: Secondary | ICD-10-CM | POA: Insufficient documentation

## 2020-02-24 DIAGNOSIS — I471 Supraventricular tachycardia: Secondary | ICD-10-CM | POA: Insufficient documentation

## 2020-02-24 NOTE — Progress Notes (Signed)
Cardiology Office Note   Date:  02/26/2020   ID:  Chad [name], DOB [birth year], MRN Chad [name]  PCP:  [name], MD  Cardiologist:   Minus Breeding, MD   Chief Complaint  Patient presents with  . Palpitations      History of Present Illness: Chad [name] is a 86 y.o. male who presents for follow up of atrial fib.   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  He has been treated for SVT as well by Dr. Caryl Comes.  Since I last saw him he unfortunately lost his wife of 74 years.  She has metastatic cancer.  He has done relatively well from a cardiovascular standpoint.  He will occasionally have some rapid heartbeats but these are not prolonged and he can do a Valsalva maneuver and they will go away.  He does not have presyncope or syncope.  He has had a couple of episodes where he felt diaphoretic and he said his pulse oximeter showed his heart rate was 37.  This happened at the end of February.  It might of happened one other time.  He might have been feeling his heart skipping at that time but is not clear.  He is very limited in his activities because of severe chronic back and hip pain.  He does get dyspneic with exertion but this is baseline.  He is not describing PND or orthopnea.  He is not describing chest pressure, neck or arm discomfort.  He has had no weight gain or edema.   Past Medical History:  Diagnosis Date  . Bladder cancer ([place]) 2010  . Chronic bronchitis ([place])   . Hypertension   . Hypoglycemia   . On home oxygen therapy    "2L w/CPAP at night" (06/21/2017)  . OSA on CPAP    "w/2L O2 at night" (06/21/2017)  . Persistent atrial fibrillation ([place])       . Pneumonia    "when he was young; again in 2016" (06/21/2017)  . Presence of permanent cardiac pacemaker   . Sensory ataxia     Past Surgical History:  Procedure Laterality Date  . BACK SURGERY    . CARDIOVERSION N/A 04/23/2015   Procedure: CARDIOVERSION;  Surgeon: [name], MD;   Location: [facility];  Service: Cardiovascular;  Laterality: N/A;  . CARDIOVERSION N/A 07/30/2015   Procedure: CARDIOVERSION;  Surgeon: [name], MD;  Location: [facility] ENDOSCOPY;  Service: Cardiovascular;  Laterality: N/A;  . EYE SURGERY Bilateral 2009   "to lower pressure; no glaucoma" (06/21/2017)  . INSERT / REPLACE / REMOVE PACEMAKER  06/21/2017  . [place]  . PACEMAKER IMPLANT N/A 06/21/2017   Procedure: Pacemaker Implant;  Surgeon: Deboraha Sprang, MD;  Location: [facility] CV [facility];  Service: Cardiovascular;  Laterality: N/A;  . TRANSURETHRAL RESECTION OF BLADDER TUMOR WITH GYRUS (TURBT-GYRUS)  07/10/2009    Cystourethroscopy, gyrus TURBT (transurethral  resection of bladder tumor).[name] 03/16/2011     Current Outpatient Medications  Medication Sig Dispense Refill  . Calcium-Magnesium 500-250 MG TABS Take 1 tablet by mouth daily.      . Cholecalciferol (VITAMIN D3) 2000 units capsule Take 2,000 Units by mouth 2 (two) times daily.    . Coenzyme Q10 (EQL COQ10) 100 MG capsule Take 100 mg by mouth daily.     Marland Kitchen dofetilide (TIKOSYN) 250 MCG capsule Take 1 capsule by mouth twice daily 180 capsule 0  . escitalopram (LEXAPRO) 10 MG tablet  Take 10 mg by mouth at bedtime.     . furosemide (LASIX) 20 MG tablet TAKE 1 TABLET BY MOUTH ONCE DAILY AS NEEDED FOR FLUID OR EDEMA 90 tablet 3  . gabapentin (NEURONTIN) 100 MG capsule Take 100 mg by mouth 3 (three) times daily.    Marland Kitchen HYDROcodone-acetaminophen (NORCO) 10-325 MG tablet Take 1-2 tablets by mouth 4 (four) times daily as needed.    . irbesartan (AVAPRO) 300 MG tablet Take 1 tablet by mouth once daily 90 tablet 0  . metoprolol succinate (TOPROL-XL) 50 MG 24 hr tablet TAKE 2 TABLETS BY MOUTH ONCE DAILY IN THE MORNING AND 1 TABLET ONCE DAILY IN THE EVENING . 270 tablet 2  . Multiple Vitamins-Minerals (CENTRUM SILVER PO) Take 1 tablet by mouth daily.      . OXYGEN Inhale 2 L into the lungs at bedtime. Uses with CPAP machine    .  triamcinolone cream (KENALOG) 0.1 % APPLY TOPICALLY TO AFFECTED AREA(S) AFTER BATHING    . vitamin B-12 (CYANOCOBALAMIN) 500 MCG tablet Take 500 mcg by mouth 2 (two) times daily.    Marland Kitchen warfarin (COUMADIN) 5 MG tablet TAKE 1/2 TO 1 (ONE-HALF TO ONE) TABLET BY MOUTH ONCE DAILY AS DIRECTED BY COUMADIN CLINIC 90 tablet 0   No current facility-administered medications for this visit.    Allergies:   Carbapenems, Cephalexin, Cephalosporins, Chlorpheniramine, Ciprofloxacin, Decongestant [oxymetazoline], Diltiazem, Flagyl [metronidazole], Hctz [hydrochlorothiazide], Levaquin [levofloxacin in d5w], Levofloxacin, Lidocaine, Penicillins, Teline [tetracycline], Tetanus toxoid, Z-pak [azithromycin], Sulfonamide derivatives, and Toprol xl [metoprolol tartrate]    ROS:  Please see the history of present illness.   Otherwise, review of systems are positive for none.   All other systems are reviewed and negative.    PHYSICAL EXAM: VS:  BP 134/66   Pulse 68   Temp (!) 97.1 F (36.2 C)   Ht 5\' 9"  (1.753 m)   Wt 239 lb 12.8 oz (108.8 kg)   BMI 35.41 kg/m  , BMI Body mass index is 35.41 kg/m. GENERAL:  Well appearing NECK:  No jugular venous distention, waveform within normal limits, carotid upstroke brisk and symmetric, no bruits, no thyromegaly LUNGS:  Clear to auscultation bilaterally CHEST: Well-healed pacemaker scar.  Large lipoma. HEART:  PMI not displaced or sustained,S1 and S2 within normal limits, no S3, no S4, no clicks, no rubs, no murmurs ABD:  Flat, positive bowel sounds normal in frequency in pitch, no bruits, no rebound, no guarding, no midline pulsatile mass, no hepatomegaly, no splenomegaly EXT:  2 plus pulses throughout, no edema, no cyanosis no clubbing   EKG: Atrial paced rhythm, first-degree AV block, right bundle branch block.  Recent Labs: 09/05/2019: BUN 11; Creatinine, Ser 1.11; Magnesium 2.3; Potassium 4.4; Sodium 142    Lipid Panel    Component Value Date/Time   CHOL 179  06/10/2010 1617   TRIG 75 06/10/2010 1617   HDL 46 06/10/2010 1617   LDLCALC 118 06/10/2010 1617      Wt Readings from Last 3 Encounters:  02/26/20 239 lb 12.8 oz (108.8 kg)  09/05/19 239 lb (108.4 kg)  01/18/19 263 lb (119.3 kg)      Other studies Reviewed: Additional studies/ records that were reviewed today include: Labs Review of the above records demonstrates:  See elsewhere   ASSESSMENT AND PLAN:   ATRIAL FIB:  Chad [name] has a CHA2DS2 - VASc score of 3.   He has not had any sustained paroxysms.  He tolerates anticoagulation.  No change  in therapy.   HTN:  The blood pressure is at target.  No change in therapy.   HFpEF:   He seems to be euvolemic.  His lower extremity swelling is actually less than previous.  No change in therapy.  SVT: He has had rare tachypalpitations that he can break.  Therefore, I will not change his current dose of beta-blocker.   BRADYCARDIA: He reports some low heart rates.  I will send him to get pacemaker interrogation.  COVID EDUCATION: He has had both of his vaccines.   Current medicines are reviewed at length with the patient today.  The patient does not have concerns regarding medicines.  The following changes have been made:   None  Labs/ tests ordered today include:  None  Orders Placed This Encounter  Procedures  . EKG 12-Lead     Disposition:   FU with me in 12 months.     Signed, Minus Breeding, MD  02/26/2020 12:49 PM    [facility]

## 2020-02-26 ENCOUNTER — Other Ambulatory Visit: Payer: Self-pay

## 2020-02-26 ENCOUNTER — Encounter: Payer: Self-pay | Admitting: Cardiology

## 2020-02-26 ENCOUNTER — Ambulatory Visit: Payer: Medicare Other | Admitting: Cardiology

## 2020-02-26 ENCOUNTER — Telehealth: Payer: Self-pay

## 2020-02-26 VITALS — BP 134/66 | HR 68 | Temp 97.1°F | Ht 69.0 in | Wt 239.8 lb

## 2020-02-26 DIAGNOSIS — I480 Paroxysmal atrial fibrillation: Secondary | ICD-10-CM | POA: Diagnosis not present

## 2020-02-26 DIAGNOSIS — I5032 Chronic diastolic (congestive) heart failure: Secondary | ICD-10-CM

## 2020-02-26 DIAGNOSIS — I471 Supraventricular tachycardia: Secondary | ICD-10-CM

## 2020-02-26 DIAGNOSIS — I10 Essential (primary) hypertension: Secondary | ICD-10-CM | POA: Diagnosis not present

## 2020-02-26 DIAGNOSIS — Z7189 Other specified counseling: Secondary | ICD-10-CM

## 2020-02-26 NOTE — Telephone Encounter (Signed)
Dr Percival Spanish wants to know how the pt transmission looks. The pt is having some low heart rates. The pt is to go home and send a manual transmission and call me to let me know if it came to [place].

## 2020-02-26 NOTE — Patient Instructions (Addendum)
Medication Instructions:  No changes *If you need a refill on your cardiac medications before your next appointment, please call your pharmacy*  Lab Work: None ordered this visit  Testing/Procedures: None ordered this visit  Follow-Up: At [facility], you and your health needs are our priority.  As part of our continuing mission to provide you with exceptional heart care, we have created designated Provider Care Teams.  These Care Teams include your primary Cardiologist (physician) and Advanced Practice Providers (APPs -  Physician Assistants and Nurse Practitioners) who all work together to provide you with the care you need, when you need it.  Your next appointment:   12 month(s)  You will receive a reminder letter in the mail two months in advance. If you don't receive a letter, please call our office to schedule the follow-up appointment.  The format for your next appointment:   In Person  Provider:   Minus Breeding, MD   Other Instructions Have pacemaker interrogated soon. [facility] has been contacted. Go home and send a remote transmission and then call [phone] to see if it went through.   We recommend signing up for the patient portal called "MyChart".  Sign up information is provided on this After Visit Summary.  MyChart is used to connect with patients for Virtual Visits (Telemedicine).  Patients are able to view lab/test results, encounter notes, upcoming appointments, etc.  Non-urgent messages can be sent to your provider as well.   To learn more about what you can do with MyChart, go to [email or web address].

## 2020-02-27 NOTE — Telephone Encounter (Signed)
Device function normal.  Pt did have episodes of AF towards the "end of February" which may explain why his pulse ox falsely read his pulse as being low (due to irregularity).   Legrand Como [address]" [place], PA-C  02/27/2020 3:52 PM

## 2020-02-27 NOTE — Telephone Encounter (Signed)
Transmission received 02/27/2020

## 2020-02-27 NOTE — Telephone Encounter (Signed)
Patient and daughter Chad [name]) called. Informed patient and daughter than transmission looked good, patient did have brief episode of AF end of feburary. Informed/educated patient and daughter how to check manual pulse if patient became symptomatic. Advised daughter and patient to call DC if they have any questions or concerns. Verbalizes understanding.

## 2020-02-27 NOTE — Telephone Encounter (Signed)
LMOM to [facility] device clinic

## 2020-02-27 NOTE — Telephone Encounter (Signed)
The pt wife is going to call me when the pt is ready to send a transmission.

## 2020-03-09 ENCOUNTER — Telehealth: Payer: Self-pay | Admitting: Cardiology

## 2020-03-09 NOTE — Telephone Encounter (Signed)
Patient takes Warfarin, bad arthritis and is scheduled for appointment Wednesday for acupuncture  Discussed with Pharm D and ok as long as one doing acupuncture aware/ok with it Viviann Spare Will forward to Dr Percival Spanish so he will be aware

## 2020-03-09 NOTE — Telephone Encounter (Signed)
Chad [name] is calling wanting to know if it is okay for Chad [name] to get acupuncture due to him being on blood thinners. Please advise.

## 2020-03-23 ENCOUNTER — Other Ambulatory Visit: Payer: Self-pay

## 2020-03-23 ENCOUNTER — Ambulatory Visit (INDEPENDENT_AMBULATORY_CARE_PROVIDER_SITE_OTHER): Payer: Medicare Other | Admitting: *Deleted

## 2020-03-23 DIAGNOSIS — Z5181 Encounter for therapeutic drug level monitoring: Secondary | ICD-10-CM

## 2020-03-23 DIAGNOSIS — I4819 Other persistent atrial fibrillation: Secondary | ICD-10-CM

## 2020-03-23 LAB — POCT INR: INR: 2.1 (ref 2.0–3.0)

## 2020-03-23 NOTE — Patient Instructions (Signed)
Using CBD oil for pain Continue warfarin 1/2 tablet daily  Recheck INR in 4 weeks 

## 2020-03-26 ENCOUNTER — Ambulatory Visit (INDEPENDENT_AMBULATORY_CARE_PROVIDER_SITE_OTHER): Payer: Medicare Other | Admitting: *Deleted

## 2020-03-26 DIAGNOSIS — I495 Sick sinus syndrome: Secondary | ICD-10-CM | POA: Diagnosis not present

## 2020-03-26 DIAGNOSIS — G894 Chronic pain syndrome: Secondary | ICD-10-CM | POA: Diagnosis not present

## 2020-03-26 DIAGNOSIS — E1129 Type 2 diabetes mellitus with other diabetic kidney complication: Secondary | ICD-10-CM | POA: Diagnosis not present

## 2020-03-26 DIAGNOSIS — Z6836 Body mass index (BMI) 36.0-36.9, adult: Secondary | ICD-10-CM | POA: Diagnosis not present

## 2020-03-26 DIAGNOSIS — E74819 Disorders of glucose transport, unspecified: Secondary | ICD-10-CM | POA: Diagnosis not present

## 2020-03-26 DIAGNOSIS — I480 Paroxysmal atrial fibrillation: Secondary | ICD-10-CM

## 2020-03-26 LAB — CUP PACEART REMOTE DEVICE CHECK
Battery Remaining Longevity: 112 mo
Battery Remaining Percentage: 95.5 %
Battery Voltage: 3.01 V
Brady Statistic AP VP Percent: 19 %
Brady Statistic AP VS Percent: 61 %
Brady Statistic AS VP Percent: 2.5 %
Brady Statistic AS VS Percent: 16 %
Brady Statistic RA Percent Paced: 78 %
Brady Statistic RV Percent Paced: 22 %
Date Time Interrogation Session: 20210513023611
Implantable Lead Implant Date: 20180808
Implantable Lead Implant Date: 20180808
Implantable Lead Location: 753859
Implantable Lead Location: 753860
Implantable Lead Model: 5076
Implantable Lead Model: 5076
Implantable Pulse Generator Implant Date: 20180808
Lead Channel Impedance Value: 450 Ohm
Lead Channel Impedance Value: 460 Ohm
Lead Channel Pacing Threshold Amplitude: 0.5 V
Lead Channel Pacing Threshold Amplitude: 0.75 V
Lead Channel Pacing Threshold Pulse Width: 0.5 ms
Lead Channel Pacing Threshold Pulse Width: 0.5 ms
Lead Channel Sensing Intrinsic Amplitude: 12 mV
Lead Channel Sensing Intrinsic Amplitude: 3.4 mV
Lead Channel Setting Pacing Amplitude: 2 V
Lead Channel Setting Pacing Amplitude: 2.5 V
Lead Channel Setting Pacing Pulse Width: 0.5 ms
Lead Channel Setting Sensing Sensitivity: 2 mV
Pulse Gen Model: 2272
Pulse Gen Serial Number: 8932564

## 2020-03-27 DIAGNOSIS — H40012 Open angle with borderline findings, low risk, left eye: Secondary | ICD-10-CM | POA: Diagnosis not present

## 2020-03-27 DIAGNOSIS — H26493 Other secondary cataract, bilateral: Secondary | ICD-10-CM | POA: Diagnosis not present

## 2020-03-27 NOTE — Progress Notes (Signed)
Remote pacemaker transmission.   

## 2020-03-30 ENCOUNTER — Other Ambulatory Visit: Payer: Self-pay

## 2020-03-30 ENCOUNTER — Ambulatory Visit: Payer: Medicare Other | Admitting: Podiatry

## 2020-03-30 DIAGNOSIS — M79675 Pain in left toe(s): Secondary | ICD-10-CM

## 2020-03-30 DIAGNOSIS — M79674 Pain in right toe(s): Secondary | ICD-10-CM

## 2020-03-30 DIAGNOSIS — B351 Tinea unguium: Secondary | ICD-10-CM | POA: Diagnosis not present

## 2020-03-30 DIAGNOSIS — G629 Polyneuropathy, unspecified: Secondary | ICD-10-CM

## 2020-03-30 MED ORDER — CICLOPIROX 8 % EX SOLN: Freq: Every day | CUTANEOUS | 2 refills | Status: DC

## 2020-03-30 NOTE — Patient Instructions (Signed)
Ciclopirox nail solution What is this medicine? CICLOPIROX (sye kloe PEER ox) NAIL SOLUTION is an antifungal medicine. It used to treat fungal infections of the nails. This medicine may be used for other purposes; ask your health care provider or pharmacist if you have questions. COMMON BRAND NAME(S): CNL8, Penlac What should I tell my health care provider before I take this medicine? They need to know if you have any of these conditions:  diabetes mellitus  history of seizures  HIV infection  immune system problems or organ transplant  large areas of burned or damaged skin  peripheral vascular disease or poor circulation  taking corticosteroid medication (including steroid inhalers, cream, or lotion)  an unusual or allergic reaction to ciclopirox, isopropyl alcohol, other medicines, foods, dyes, or preservatives  pregnant or trying to get pregnant  breast-feeding How should I use this medicine? This medicine is for external use only. Follow the directions that come with this medicine exactly. Wash and dry your hands before use. Avoid contact with the eyes, mouth or nose. If you do get this medicine in your eyes, rinse out with plenty of cool tap water. Contact your doctor or health care professional if eye irritation occurs. Use at regular intervals. Do not use your medicine more often than directed. Finish the full course prescribed by your doctor or health care professional even if you think you are better. Do not stop using except on your doctor's advice. Talk to your pediatrician regarding the use of this medicine in children. While this medicine may be prescribed for children as young as 12 years for selected conditions, precautions do apply. Overdosage: If you think you have taken too much of this medicine contact a poison control center or emergency room at once. NOTE: This medicine is only for you. Do not share this medicine with others. What if I miss a dose? If you miss a  dose, use it as soon as you can. If it is almost time for your next dose, use only that dose. Do not use double or extra doses. What may interact with this medicine? Interactions are not expected. Do not use any other skin products without telling your doctor or health care professional. This list may not describe all possible interactions. Give your health care provider a list of all the medicines, herbs, non-prescription drugs, or dietary supplements you use. Also tell them if you smoke, drink alcohol, or use illegal drugs. Some items may interact with your medicine. What should I watch for while using this medicine? Tell your doctor or health care professional if your symptoms get worse. Four to six months of treatment may be needed for the nail(s) to improve. Some people may not achieve a complete cure or clearing of the nails by this time. Tell your doctor or health care professional if you develop sores or blisters that do not heal properly. If your nail infection returns after stopping using this product, contact your doctor or health care professional. What side effects may I notice from receiving this medicine? Side effects that you should report to your doctor or health care professional as soon as possible:  allergic reactions like skin rash, itching or hives, swelling of the face, lips, or tongue  severe irritation, redness, burning, blistering, peeling, swelling, oozing Side effects that usually do not require medical attention (report to your doctor or health care professional if they continue or are bothersome):  mild reddening of the skin  nail discoloration  temporary burning or mild   stinging at the site of application This list may not describe all possible side effects. Call your doctor for medical advice about side effects. You may report side effects to FDA at 1-800-FDA-1088. Where should I keep my medicine? Keep out of the reach of children. Store at room temperature  between 15 and 30 degrees C (59 and 86 degrees F). Do not freeze. Protect from light by storing the bottle in the carton after every use. This medicine is flammable. Keep away from heat and flame. Throw away any unused medicine after the expiration date. NOTE: This sheet is a summary. It may not cover all possible information. If you have questions about this medicine, talk to your doctor, pharmacist, or health care provider.  2020 Elsevier/Gold Standard (2008-02-04 16:49:20)  

## 2020-04-06 NOTE — Progress Notes (Signed)
Subjective:   Patient ID: Chad [name], male   DOB: 86 y.o.   MRN: [id number]   HPI 86 year old male presents the office today for concerns of thick, discolored toenails which has been in about 1 year.  He has tried coconut oil which has helped some.  He is requesting his nails be trimmed today's are very thick and he cannot trim them himself and they are causing discomfort.  He does have a history of neuropathy.  He is on gabapentin.  He is not diabetic.  Last A1c was 5.9.  No open sores.   Review of Systems  All other systems reviewed and are negative.  Past Medical History:  Diagnosis Date  . Bladder cancer ([place]) 2010  . Chronic bronchitis ([place])   . Hypertension   . Hypoglycemia   . On home oxygen therapy    "2L w/CPAP at night" (06/21/2017)  . OSA on CPAP    "w/2L O2 at night" (06/21/2017)  . Persistent atrial fibrillation ([place])       . Pneumonia    "when he was young; again in 2016" (06/21/2017)  . Presence of permanent cardiac pacemaker   . Sensory ataxia     Past Surgical History:  Procedure Laterality Date  . BACK SURGERY    . CARDIOVERSION N/A 04/23/2015   Procedure: CARDIOVERSION;  Surgeon: [name], MD;  Location: [place];  Service: Cardiovascular;  Laterality: N/A;  . CARDIOVERSION N/A 07/30/2015   Procedure: CARDIOVERSION;  Surgeon: [name], MD;  Location: [facility];  Service: Cardiovascular;  Laterality: N/A;  . EYE SURGERY Bilateral 2009   "to lower pressure; no glaucoma" (06/21/2017)  . INSERT / REPLACE / REMOVE PACEMAKER  06/21/2017  . [place]  . PACEMAKER IMPLANT N/A 06/21/2017   Procedure: Pacemaker Implant;  Surgeon: Deboraha Sprang, MD;  Location: [place] [facility];  Service: Cardiovascular;  Laterality: N/A;  . TRANSURETHRAL RESECTION OF BLADDER TUMOR WITH GYRUS (TURBT-GYRUS)  07/10/2009    Cystourethroscopy, gyrus TURBT (transurethral  resection of bladder tumor).[name] 03/16/2011     Current Outpatient Medications:  .   Calcium-Magnesium 500-250 MG TABS, Take 1 tablet by mouth daily.  , Disp: , Rfl:  .  Cholecalciferol (VITAMIN D3) 2000 units capsule, Take 2,000 Units by mouth 2 (two) times daily., Disp: , Rfl:  .  ciclopirox (PENLAC) 8 % solution, Apply topically at bedtime. Apply over nail and surrounding skin. Apply daily over previous coat. After seven (7) days, may remove with alcohol and continue cycle., Disp: 6.6 mL, Rfl: 2 .  Coenzyme Q10 (EQL COQ10) 100 MG capsule, Take 100 mg by mouth daily. , Disp: , Rfl:  .  dofetilide (TIKOSYN) 250 MCG capsule, Take 1 capsule by mouth twice daily, Disp: 180 capsule, Rfl: 0 .  escitalopram (LEXAPRO) 10 MG tablet, Take 10 mg by mouth at bedtime. , Disp: , Rfl:  .  furosemide (LASIX) 20 MG tablet, TAKE 1 TABLET BY MOUTH ONCE DAILY AS NEEDED FOR FLUID OR EDEMA, Disp: 90 tablet, Rfl: 3 .  gabapentin (NEURONTIN) 100 MG capsule, Take 100 mg by mouth 3 (three) times daily., Disp: , Rfl:  .  HYDROcodone-acetaminophen (NORCO) 10-325 MG tablet, Take 1-2 tablets by mouth 4 (four) times daily as needed., Disp: , Rfl:  .  irbesartan (AVAPRO) 300 MG tablet, Take 1 tablet by mouth once daily, Disp: 90 tablet, Rfl: 0 .  metoprolol succinate (TOPROL-XL) 50 MG 24 hr tablet, TAKE 2 TABLETS BY  MOUTH ONCE DAILY IN THE MORNING AND 1 TABLET ONCE DAILY IN THE EVENING ., Disp: 270 tablet, Rfl: 2 .  Multiple Vitamins-Minerals (CENTRUM SILVER PO), Take 1 tablet by mouth daily.  , Disp: , Rfl:  .  OXYGEN, Inhale 2 L into the lungs at bedtime. Uses with CPAP machine, Disp: , Rfl:  .  triamcinolone cream (KENALOG) 0.1 %, APPLY TOPICALLY TO AFFECTED AREA(S) AFTER BATHING, Disp: , Rfl:  .  vitamin B-12 (CYANOCOBALAMIN) 500 MCG tablet, Take 500 mcg by mouth 2 (two) times daily., Disp: , Rfl:  .  warfarin (COUMADIN) 5 MG tablet, TAKE 1/2 TO 1 (ONE-HALF TO ONE) TABLET BY MOUTH ONCE DAILY AS DIRECTED BY COUMADIN CLINIC, Disp: 90 tablet, Rfl: 0  Allergies  Allergen Reactions  . Carbapenems   .  Cephalexin Other (See Comments)  . Cephalosporins Other (See Comments)  . Chlorpheniramine Other (See Comments)    Swells prostate Swells prostate  . Ciprofloxacin     Pt can not take with amiodarone   . Decongestant [Oxymetazoline]     All decongestant - makes prostate swell  . Diltiazem Swelling    Left leg swelling  . Flagyl [Metronidazole] Nausea Only    Stomach ache  . Hctz [Hydrochlorothiazide]   . Levaquin [Levofloxacin In D5w] Other (See Comments)    unknown  . Levofloxacin Nausea Only  . Lidocaine     Pt unsure of  . Penicillins Swelling    Has patient had a PCN reaction causing immediate rash, facial/tongue/throat swelling, SOB or lightheadedness with hypotension: No Has patient had a PCN reaction causing severe rash involving mucus membranes or skin necrosis: No Has patient had a PCN reaction that required hospitalization: No Has patient had a PCN reaction occurring within the last 10 years: No If all of the above answers are "NO", then may proceed with Cephalosporin use. Swelling at injection site  . Teline [Tetracycline] Swelling    Hospitalization for 2 weeks, prostate swelling  . Tetanus Toxoid Swelling    Swells arms, Please pre-medicate with benadryl.  Marland Kitchen Z-Pak [Azithromycin] Other (See Comments)    Pt can not take with Amiodarone   . Sulfonamide Derivatives Rash  . Toprol Xl [Metoprolol Tartrate] Itching and Rash    Pt states he tolerates.         Objective:  Physical Exam  General: AAO x3, NAD  Dermatological: Nails are hypertrophic, dystrophic, brittle, yellow discoloration with subungual debris and elongated 10. No surrounding redness or drainage. Tenderness nails 1-5 bilaterally. No open lesions or pre-ulcerative lesions are identified today.  Vascular: Dorsalis Pedis artery and Posterior Tibial artery pedal pulses are 2/4 bilateral with immedate capillary fill time. There is no pain with calf compression, swelling, warmth, erythema.   Neruologic:  Sensation decreased with SWMF.   Musculoskeletal: No gross boney pedal deformities bilateral. No pain, crepitus, or limitation noted with foot and ankle range of motion bilateral. Muscular strength 5/5 in all groups tested bilateral.  Gait: Unassisted, Nonantalgic.       Assessment:   Symptomatic onychomycosis    Plan:  -Treatment options discussed including all alternatives, risks, and complications -Etiology of symptoms were discussed -Nails debrided 10 without complications or bleeding. Rx Penlac.  -Daily foot inspection -Follow-up in 3 months or sooner if any problems arise. In the meantime, encouraged to call the office with any questions, concerns, change in symptoms.   Celesta Gentile, DPM

## 2020-04-07 ENCOUNTER — Ambulatory Visit: Payer: Medicare Other | Admitting: Podiatry

## 2020-04-14 ENCOUNTER — Ambulatory Visit: Payer: Medicare Other | Admitting: Podiatry

## 2020-04-16 DIAGNOSIS — H26492 Other secondary cataract, left eye: Secondary | ICD-10-CM | POA: Diagnosis not present

## 2020-04-22 ENCOUNTER — Ambulatory Visit (INDEPENDENT_AMBULATORY_CARE_PROVIDER_SITE_OTHER): Payer: Medicare Other | Admitting: Pharmacist

## 2020-04-22 ENCOUNTER — Other Ambulatory Visit: Payer: Self-pay

## 2020-04-22 DIAGNOSIS — Z5181 Encounter for therapeutic drug level monitoring: Secondary | ICD-10-CM

## 2020-04-22 DIAGNOSIS — I4819 Other persistent atrial fibrillation: Secondary | ICD-10-CM

## 2020-04-22 LAB — POCT INR: INR: 1.9 — AB (ref 2.0–3.0)

## 2020-04-22 NOTE — Patient Instructions (Signed)
Description   Using CBD oil for pain Take 1 tablet today, then continue warfarin 1/2 tablet daily  Recheck INR in 4 weeks

## 2020-04-27 ENCOUNTER — Other Ambulatory Visit: Payer: Self-pay | Admitting: Cardiology

## 2020-04-30 ENCOUNTER — Telehealth: Payer: Self-pay | Admitting: Cardiology

## 2020-04-30 NOTE — Telephone Encounter (Signed)
New Message    Pt c/o medication issue:  1. Name of Medication: irbesartan (AVAPRO) 300 MG tablet  2. How are you currently taking this medication (dosage and times per day)? Pt is taking 150 mg daily   3. Are you having a reaction (difficulty breathing--STAT)? No   4. What is your medication issue? Abundio Miu from [facility] is calling and says she spoke with the pts wife and she says the pt is now taking 150 mg daily. Abundio Miu is asking for a new refill of the medication with the right dosage to be sent to [facility], [address] for a 90 day supply.    Please advise

## 2020-05-02 NOTE — Telephone Encounter (Signed)
He takes two 50 mg in the morning and one in the evening so he is taking 150 total.

## 2020-05-04 ENCOUNTER — Telehealth: Payer: Self-pay | Admitting: Cardiology

## 2020-05-04 DIAGNOSIS — N281 Cyst of kidney, acquired: Secondary | ICD-10-CM | POA: Diagnosis not present

## 2020-05-04 DIAGNOSIS — N401 Enlarged prostate with lower urinary tract symptoms: Secondary | ICD-10-CM | POA: Diagnosis not present

## 2020-05-04 DIAGNOSIS — R3912 Poor urinary stream: Secondary | ICD-10-CM | POA: Diagnosis not present

## 2020-05-04 DIAGNOSIS — Z8551 Personal history of malignant neoplasm of bladder: Secondary | ICD-10-CM | POA: Diagnosis not present

## 2020-05-04 MED ORDER — IRBESARTAN 300 MG PO TABS
300.0000 mg | ORAL_TABLET | Freq: Every day | ORAL | 3 refills | Status: DC
Start: 2020-05-04 — End: 2020-06-15

## 2020-05-04 NOTE — Telephone Encounter (Signed)
*  STAT* If patient is at the pharmacy, call can be transferred to refill team.   1. Which medications need to be refilled? (please list name of each medication and dose if known)  Need new prescription for Irbesartan- change in dosage  2. Which pharmacy/location (including street and city if local pharmacy) is medication to be sent to? [facility] [place]  3. Do they need a 30 day or 90 day supply? 90 days and refills

## 2020-05-22 ENCOUNTER — Other Ambulatory Visit: Payer: Self-pay

## 2020-05-22 ENCOUNTER — Ambulatory Visit (INDEPENDENT_AMBULATORY_CARE_PROVIDER_SITE_OTHER): Payer: Medicare Other | Admitting: *Deleted

## 2020-05-22 DIAGNOSIS — I4819 Other persistent atrial fibrillation: Secondary | ICD-10-CM

## 2020-05-22 DIAGNOSIS — Z5181 Encounter for therapeutic drug level monitoring: Secondary | ICD-10-CM | POA: Diagnosis not present

## 2020-05-22 DIAGNOSIS — M47816 Spondylosis without myelopathy or radiculopathy, lumbar region: Secondary | ICD-10-CM | POA: Diagnosis not present

## 2020-05-22 DIAGNOSIS — M9903 Segmental and somatic dysfunction of lumbar region: Secondary | ICD-10-CM | POA: Diagnosis not present

## 2020-05-22 LAB — POCT INR: INR: 2.4 (ref 2.0–3.0)

## 2020-05-22 NOTE — Patient Instructions (Signed)
Using CBD oil for pain Continue warfarin 1/2 tablet daily  Recheck INR in 4 weeks

## 2020-05-26 DIAGNOSIS — Z6836 Body mass index (BMI) 36.0-36.9, adult: Secondary | ICD-10-CM | POA: Diagnosis not present

## 2020-05-26 DIAGNOSIS — Z0001 Encounter for general adult medical examination with abnormal findings: Secondary | ICD-10-CM | POA: Diagnosis not present

## 2020-05-26 DIAGNOSIS — Z1389 Encounter for screening for other disorder: Secondary | ICD-10-CM | POA: Diagnosis not present

## 2020-05-26 DIAGNOSIS — E6609 Other obesity due to excess calories: Secondary | ICD-10-CM | POA: Diagnosis not present

## 2020-05-26 DIAGNOSIS — E7849 Other hyperlipidemia: Secondary | ICD-10-CM | POA: Diagnosis not present

## 2020-05-27 DIAGNOSIS — M47816 Spondylosis without myelopathy or radiculopathy, lumbar region: Secondary | ICD-10-CM | POA: Diagnosis not present

## 2020-05-27 DIAGNOSIS — M9903 Segmental and somatic dysfunction of lumbar region: Secondary | ICD-10-CM | POA: Diagnosis not present

## 2020-06-15 ENCOUNTER — Other Ambulatory Visit: Payer: Self-pay | Admitting: Cardiology

## 2020-06-22 ENCOUNTER — Ambulatory Visit: Payer: Medicare Other | Admitting: *Deleted

## 2020-06-22 DIAGNOSIS — Z5181 Encounter for therapeutic drug level monitoring: Secondary | ICD-10-CM

## 2020-06-22 DIAGNOSIS — I4819 Other persistent atrial fibrillation: Secondary | ICD-10-CM | POA: Diagnosis not present

## 2020-06-22 LAB — POCT INR: INR: 2.3 (ref 2.0–3.0)

## 2020-06-22 NOTE — Patient Instructions (Signed)
Using CBD oil for pain Continue warfarin 1/2 tablet daily  Recheck INR in 6 weeks 

## 2020-06-30 ENCOUNTER — Telehealth: Payer: Self-pay | Admitting: Cardiology

## 2020-06-30 ENCOUNTER — Other Ambulatory Visit: Payer: Self-pay

## 2020-06-30 ENCOUNTER — Encounter: Payer: Self-pay | Admitting: Podiatry

## 2020-06-30 ENCOUNTER — Ambulatory Visit (INDEPENDENT_AMBULATORY_CARE_PROVIDER_SITE_OTHER): Payer: Medicare Other | Admitting: Podiatry

## 2020-06-30 ENCOUNTER — Ambulatory Visit (INDEPENDENT_AMBULATORY_CARE_PROVIDER_SITE_OTHER): Payer: Medicare Other | Admitting: *Deleted

## 2020-06-30 DIAGNOSIS — I5033 Acute on chronic diastolic (congestive) heart failure: Secondary | ICD-10-CM

## 2020-06-30 DIAGNOSIS — M79674 Pain in right toe(s): Secondary | ICD-10-CM

## 2020-06-30 DIAGNOSIS — B351 Tinea unguium: Secondary | ICD-10-CM

## 2020-06-30 DIAGNOSIS — G629 Polyneuropathy, unspecified: Secondary | ICD-10-CM

## 2020-06-30 DIAGNOSIS — R001 Bradycardia, unspecified: Secondary | ICD-10-CM

## 2020-06-30 DIAGNOSIS — M79675 Pain in left toe(s): Secondary | ICD-10-CM

## 2020-06-30 DIAGNOSIS — R0602 Shortness of breath: Secondary | ICD-10-CM

## 2020-06-30 LAB — CUP PACEART REMOTE DEVICE CHECK
Battery Remaining Longevity: 112 mo
Battery Remaining Percentage: 95.5 %
Battery Voltage: 2.99 V
Brady Statistic AP VP Percent: 17 %
Brady Statistic AP VS Percent: 57 %
Brady Statistic AS VP Percent: 3.3 %
Brady Statistic AS VS Percent: 23 %
Brady Statistic RA Percent Paced: 69 %
Brady Statistic RV Percent Paced: 20 %
Date Time Interrogation Session: 20210817031441
Implantable Lead Implant Date: 20180808
Implantable Lead Implant Date: 20180808
Implantable Lead Location: 753859
Implantable Lead Location: 753860
Implantable Lead Model: 5076
Implantable Lead Model: 5076
Implantable Pulse Generator Implant Date: 20180808
Lead Channel Impedance Value: 460 Ohm
Lead Channel Impedance Value: 460 Ohm
Lead Channel Pacing Threshold Amplitude: 0.5 V
Lead Channel Pacing Threshold Amplitude: 0.75 V
Lead Channel Pacing Threshold Pulse Width: 0.5 ms
Lead Channel Pacing Threshold Pulse Width: 0.5 ms
Lead Channel Sensing Intrinsic Amplitude: 12 mV
Lead Channel Sensing Intrinsic Amplitude: 2.7 mV
Lead Channel Setting Pacing Amplitude: 2 V
Lead Channel Setting Pacing Amplitude: 2.5 V
Lead Channel Setting Pacing Pulse Width: 0.5 ms
Lead Channel Setting Sensing Sensitivity: 2 mV
Pulse Gen Model: 2272
Pulse Gen Serial Number: 8932564

## 2020-06-30 NOTE — Telephone Encounter (Signed)
New Message  Pt c/o swelling: STAT is pt has developed SOB within 24 hours  1) How much weight have you gained and in what time span? unsure  2) If swelling, where is the swelling located? Feet and ankles  3) Are you currently taking a fluid pill? Yes  4) Are you currently SOB? Yes  5) Do you have a log of your daily weights (if so, list)? n/a  6) Have you gained 3 pounds in a day or 5 pounds in a week? unsure  7) Have you traveled recently? No

## 2020-06-30 NOTE — Telephone Encounter (Signed)
Spoke with pt daughter, patient just left from [facility] and he was told to contact us because of swelling and SOB. The daughter reports SOB with lying down and any activity. The patient currently takes 20 mg once daily of furosemide. Patient instructed to increase furosemide to 40 mg once daily for the next 3 days. If no improvement in symptoms by Friday he is to call us back.

## 2020-06-30 NOTE — Progress Notes (Signed)
Subjective: 86 y.o. returns the office today for painful, elongated, thickened toenails which they cannot trim themself. Denies any redness or drainage around the nails. He has been using the topical antifungal. Denies any systemic complaints such as fevers, chills, nausea, vomiting.   PCP: [name], MD   Objective: AAO 3, NAD DP/PT pulses palpable, CRT less than 3 seconds Nails hypertrophic, dystrophic, elongated, brittle, discolored 10. There is tenderness overlying the nails 1-5 bilaterally. There is no surrounding erythema or drainage along the nail sites. No open lesions or pre-ulcerative lesions are identified. No other areas of tenderness bilateral lower extremities. No overlying edema, erythema, increased warmth. No pain with calf compression, swelling, warmth, erythema.  Assessment: Patient presents with symptomatic onychomycosis  Plan: -Treatment options including alternatives, risks, complications were discussed -Nails sharply debrided 10 without complication/bleeding. -Discussed daily foot inspection. If there are any changes, to call the office immediately.  -Follow-up in 3 months or sooner if any problems are to arise. In the meantime, encouraged to call the office with any questions, concerns, changes symptoms.  *He has been having SOB over the last few days, no chest pain. Mild increase in ankle edema bilaterally. Vital signs stable. Encouraged him to call his PCP and/or cardiologist today.   Celesta Gentile, DPM

## 2020-07-02 NOTE — Progress Notes (Signed)
Remote pacemaker transmission.   

## 2020-07-03 NOTE — Addendum Note (Signed)
Addended by: Sherrie Mustache on: 07/03/2020 04:36 PM   Modules accepted: Orders

## 2020-07-03 NOTE — Telephone Encounter (Signed)
Spoke with patient's daughter. Patient seen by podiatrist this week and noted edema/SOB. Advised patient to call cardiologist. Symptoms ongoing for around 2 weeks per daughter. Patient increased PRN lasix to 40mg  daily for 3 days. Patient's swelling in legs has come down per patient. Edema still in feet. Shortness of breath has decreased but patient still has shortness of breath with activity. Patient going to decrease to 20mg  of Lasix daily through Monday. Patient placed on schedule for 8/23 at 4p. Orders placed for BMP and BNP due to shortness of breath and edema. Last BMP was 08/2019.   Patient to call back if symptoms worsen over the weekend.

## 2020-07-03 NOTE — Telephone Encounter (Signed)
    Pt c/o medication issue:  1. Name of Medication: furosemide (LASIX) 20 MG tablet  2. How are you currently taking this medication (dosage and times per day)?   3. Are you having a reaction (difficulty breathing--STAT)?   4. What is your medication issue? Pt's daughter calling to follow up, she said pt's swelling did go down and it helped pt's SOB. She wanted to know if pt needs to keep taking the lasix 2x a day or pt can take 1 tablet every other day.

## 2020-07-05 DIAGNOSIS — R001 Bradycardia, unspecified: Secondary | ICD-10-CM | POA: Insufficient documentation

## 2020-07-05 DIAGNOSIS — Z7189 Other specified counseling: Secondary | ICD-10-CM | POA: Insufficient documentation

## 2020-07-05 NOTE — Progress Notes (Signed)
Cardiology Office Note   Date:  07/07/2020   ID:  Chad [name], DOB 1928-12-21, MRN [id number]  PCP:  [name], MD  Cardiologist:   Minus Breeding, MD   Chief Complaint  Patient presents with  . Shortness of Breath  . Edema      History of Present Illness: JAICOB DIA is a 86 y.o. male who presents for follow up of atrial fib.   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  He has been treated for SVT as well by Dr. Caryl Comes.  He called recently with increased dyspnea.   He says that this started a couple of weeks ago.  He has noted that his sats have been in the low 90s.  He does use O2 at night with CPAP.  He was treated for a few days with increased Lasix and had some improvement.  The patient denies any new symptoms such as chest discomfort, neck or arm discomfort. There has been no new PND or orthopnea. There have been no reported palpitations, presyncope or syncope. He has had a cough with phlegm.  He has had some increased salt in his diet.  He does not add salt.    Of note he suggests that his heart rate sometimes is recorded in the 30s when he checks it on his home devices.  However, he has had normal pacemaker function on interrogation.   Past Medical History:  Diagnosis Date  . Bladder cancer ([place]) 2010  . Chronic bronchitis ([place])   . Hypertension   . Hypoglycemia   . On home oxygen therapy    "2L w/CPAP at night" (06/21/2017)  . OSA on CPAP    "w/2L O2 at night" (06/21/2017)  . Persistent atrial fibrillation ([place])       . Pneumonia    "when he was young; again in 2016" (06/21/2017)  . Presence of permanent cardiac pacemaker   . Sensory ataxia     Past Surgical History:  Procedure Laterality Date  . BACK SURGERY    . CARDIOVERSION N/A 04/23/2015   Procedure: CARDIOVERSION;  Surgeon: [name], MD;  Location: [facility];  Service: Cardiovascular;  Laterality: N/A;  . CARDIOVERSION N/A 07/30/2015   Procedure: CARDIOVERSION;  Surgeon: [name], MD;  Location: [facility] ENDOSCOPY;  Service: Cardiovascular;  Laterality: N/A;  . EYE SURGERY Bilateral 2009   "to lower pressure; no glaucoma" (06/21/2017)  . INSERT / REPLACE / REMOVE PACEMAKER  06/21/2017  . [place]  . PACEMAKER IMPLANT N/A 06/21/2017   Procedure: Pacemaker Implant;  Surgeon: Deboraha Sprang, MD;  Location: [place] CV LAB;  Service: Cardiovascular;  Laterality: N/A;  . TRANSURETHRAL RESECTION OF BLADDER TUMOR WITH GYRUS (TURBT-GYRUS)  07/10/2009    Cystourethroscopy, gyrus TURBT (transurethral  resection of bladder tumor).[name] 03/16/2011     Current Outpatient Medications  Medication Sig Dispense Refill  . Cholecalciferol (VITAMIN D3) 2000 units capsule Take 2,000 Units by mouth 2 (two) times daily.    . ciclopirox (PENLAC) 8 % solution Apply topically at bedtime. Apply over nail and surrounding skin. Apply daily over previous coat. After seven (7) days, may remove with alcohol and continue cycle. 6.6 mL 2  . Coenzyme Q10 (EQL COQ10) 100 MG capsule Take 100 mg by mouth daily.     Marland Kitchen dofetilide (TIKOSYN) 250 MCG capsule Take 1 capsule by mouth twice daily 180 capsule 0  . escitalopram (LEXAPRO) 10 MG tablet Take 10  mg by mouth at bedtime.     . furosemide (LASIX) 20 MG tablet TAKE 1 TABLET BY MOUTH ONCE DAILY AS NEEDED FOR FLUID OR EDEMA 90 tablet 3  . irbesartan (AVAPRO) 300 MG tablet Take 1 tablet by mouth once daily 90 tablet 3  . metoprolol succinate (TOPROL-XL) 50 MG 24 hr tablet TAKE 2 TABLETS BY MOUTH ONCE DAILY IN THE MORNING AND 1 TABLET ONCE DAILY IN THE EVENING . 270 tablet 2  . Multiple Vitamins-Minerals (CENTRUM SILVER PO) Take 1 tablet by mouth daily.      . OXYGEN Inhale 2 L into the lungs at bedtime. Uses with CPAP machine    . triamcinolone cream (KENALOG) 0.1 % APPLY TOPICALLY TO AFFECTED AREA(S) AFTER BATHING    . vitamin B-12 (CYANOCOBALAMIN) 500 MCG tablet Take 500 mcg by mouth 2 (two) times daily.    Marland Kitchen warfarin (COUMADIN) 5 MG  tablet TAKE 1/2 TO 1 (ONE-HALF TO ONE) TABLET BY MOUTH ONCE DAILY AS DIRECTED BY COUMADIN CLINIC 90 tablet 0   No current facility-administered medications for this visit.    Allergies:   Carbapenems, Cephalexin, Cephalosporins, Chlorpheniramine, Ciprofloxacin, Decongestant [oxymetazoline], Diltiazem, Flagyl [metronidazole], Hctz [hydrochlorothiazide], Levaquin [levofloxacin in d5w], Levofloxacin, Lidocaine, Penicillins, Teline [tetracycline], Tetanus toxoid, Z-pak [azithromycin], Sulfonamide derivatives, and Toprol xl [metoprolol tartrate]    ROS:  Please see the history of present illness.   Otherwise, review of systems are positive for none.   All other systems are reviewed and negative.    PHYSICAL EXAM: VS:  BP (!) 142/100 (BP Location: Left Arm, Patient Position: Sitting, Cuff Size: Normal)   Pulse 64   Ht 5\' 9"  (1.753 m)   Wt 244 lb 12.8 oz (111 kg)   BMI 36.15 kg/m  , BMI Body mass index is 36.15 kg/m. GENERAL:  Well appearing NECK:  No jugular venous distention, waveform within normal limits, carotid upstroke brisk and symmetric, no bruits, no thyromegaly LUNGS:  Clear to auscultation bilaterally CHEST:  Unremarkable HEART:  PMI not displaced or sustained,S1 and S2 within normal limits, no S3, no S4, no clicks, no rubs, no murmurs ABD:  Flat, positive bowel sounds normal in frequency in pitch, no bruits, no rebound, no guarding, no midline pulsatile mass, no hepatomegaly, no splenomegaly EXT:  2 plus pulses throughout, mild bilateral leg edema, no cyanosis no clubbing  EKG: Atrial paced rhythm, rate 64, first-degree AV block, right bundle branch block.  Lateral T wave inversions unchanged from previous.  Recent Labs: 09/05/2019: Magnesium 2.3 07/06/2020: BNP 119.9; BUN 11; Creatinine, Ser 1.07; Potassium 5.0; Sodium 139    Lipid Panel    Component Value Date/Time   CHOL 179 06/10/2010 1617   TRIG 75 06/10/2010 1617   HDL 46 06/10/2010 1617   LDLCALC 118 06/10/2010 1617       Wt Readings from Last 3 Encounters:  07/06/20 244 lb 12.8 oz (111 kg)  02/26/20 239 lb 12.8 oz (108.8 kg)  09/05/19 239 lb (108.4 kg)      Other studies Reviewed: Additional studies/ records that were reviewed today include: Labs Review of the above records demonstrates:  See elsewhere   ASSESSMENT AND PLAN:   ATRIAL FIB:  Mr. ROHAIL KLEES has a CHA2DS2 - VASc score of 3.   He has had no sustained paroxysms.  No change in therapy.  He tolerates anticoagulation.  HTN:  The blood pressure is slightly elevated.  This is unusual however.  He will keep a blood pressure diary.  I  might need to titrate his meds further.   HFpEF:    He seems to have some excess fluid.  BNP and basic metabolic profile are pending.  Would like to check a chest x-ray and an echocardiogram.  I likely will increase his Lasix permanently to 40 mg twice daily pending his renal function.  We talked about lower salt and volume management.  Hopefully he can comply.   SVT:     He has had no symptomatic tachypalpitations with this.  Is up-to-date with pacemaker placement.   BRADYCARDIA:   As above no change in therapy.  COVID EDUCATION: He has had both of his vaccines.   Current medicines are reviewed at length with the patient today.  The patient does not have concerns regarding medicines.  The following changes have been made:     Labs/ tests ordered today include:    Orders Placed This Encounter  Procedures  . DG Chest 2 View  . EKG 12-Lead  . ECHOCARDIOGRAM COMPLETE     Disposition:   FU with me in 3 months.     Signed, Minus Breeding, MD  07/07/2020 12:35 PM    [facility]

## 2020-07-06 ENCOUNTER — Other Ambulatory Visit: Payer: Self-pay

## 2020-07-06 ENCOUNTER — Ambulatory Visit: Payer: Medicare Other | Admitting: Cardiology

## 2020-07-06 ENCOUNTER — Encounter: Payer: Self-pay | Admitting: Cardiology

## 2020-07-06 VITALS — BP 142/100 | HR 64 | Ht 69.0 in | Wt 244.8 lb

## 2020-07-06 DIAGNOSIS — I480 Paroxysmal atrial fibrillation: Secondary | ICD-10-CM | POA: Diagnosis not present

## 2020-07-06 DIAGNOSIS — R001 Bradycardia, unspecified: Secondary | ICD-10-CM | POA: Diagnosis not present

## 2020-07-06 DIAGNOSIS — I5033 Acute on chronic diastolic (congestive) heart failure: Secondary | ICD-10-CM | POA: Diagnosis not present

## 2020-07-06 DIAGNOSIS — I10 Essential (primary) hypertension: Secondary | ICD-10-CM

## 2020-07-06 DIAGNOSIS — R0602 Shortness of breath: Secondary | ICD-10-CM

## 2020-07-06 DIAGNOSIS — Z7189 Other specified counseling: Secondary | ICD-10-CM

## 2020-07-06 NOTE — Patient Instructions (Signed)
Medication Instructions:  No changes  *If you need a refill on your cardiac medications before your next appointment, please call your pharmacy*   Lab Work:  Not needed If you have labs (blood work) drawn today and your tests are completely normal, you will receive your results only by: Marland Kitchen MyChart Message (if you have MyChart) OR . A paper copy in the mail If you have any lab test that is abnormal or we need to change your treatment, we will call you to review the results.   Testing/Procedures:  will be schedule at [facility] has requested that you have an echocardiogram. Echocardiography is a painless test that uses sound waves to create images of your heart. It provides your doctor with information about the size and shape of your heart and how well your heart's chambers and valves are working. This procedure takes approximately one hour. There are no restrictions for this procedure.   And Will be done at [facility] [address] A chest x-ray takes a picture of the organs and structures inside the chest, including the heart, lungs, and blood vessels. This test can show several things, including, whether the heart is enlarges; whether fluid is building up in the lungs; and whether pacemaker / defibrillator leads are still in place.   Follow-Up: At [facility], you and your health needs are our priority.  As part of our continuing mission to provide you with exceptional heart care, we have created designated Provider Care Teams.  These Care Teams include your primary Cardiologist (physician) and Advanced Practice Providers (APPs -  Physician Assistants and Nurse Practitioners) who all work together to provide you with the care you need, when you need it.  We recommend signing up for the patient portal called "MyChart".  Sign up information is provided on this After Visit Summary.  MyChart is used to connect with patients for Virtual Visits  (Telemedicine).  Patients are able to view lab/test results, encounter notes, upcoming appointments, etc.  Non-urgent messages can be sent to your provider as well.   To learn more about what you can do with MyChart, go to [email or web address].    Your next appointment:   3 month(s)  The format for your next appointment:   In Person  Provider:   Minus Breeding, MD   Other Instructions

## 2020-07-07 ENCOUNTER — Encounter: Payer: Self-pay | Admitting: Cardiology

## 2020-07-07 LAB — BRAIN NATRIURETIC PEPTIDE: BNP: 119.9 pg/mL — ABNORMAL HIGH (ref 0.0–100.0)

## 2020-07-07 LAB — BASIC METABOLIC PANEL
BUN/Creatinine Ratio: 10 (ref 10–24)
BUN: 11 mg/dL (ref 8–27)
CO2: 31 mmol/L — ABNORMAL HIGH (ref 20–29)
Calcium: 9.8 mg/dL (ref 8.6–10.2)
Chloride: 99 mmol/L (ref 96–106)
Creatinine, Ser: 1.07 mg/dL (ref 0.76–1.27)
GFR calc Af Amer: 71 mL/min/{1.73_m2} (ref >59)
GFR calc non Af Amer: 61 mL/min/{1.73_m2} (ref >59)
Glucose: 102 mg/dL — ABNORMAL HIGH (ref 65–99)
Potassium: 5 mmol/L (ref 3.5–5.2)
Sodium: 139 mmol/L (ref 134–144)

## 2020-07-08 ENCOUNTER — Ambulatory Visit
Admission: RE | Admit: 2020-07-08 | Discharge: 2020-07-08 | Disposition: A | Discharge status: Home / Self Care | Payer: Medicare Other | Source: Ambulatory Visit | Attending: Cardiology | Admitting: Cardiology

## 2020-07-08 ENCOUNTER — Other Ambulatory Visit: Payer: Self-pay

## 2020-07-08 ENCOUNTER — Telehealth: Payer: Self-pay

## 2020-07-08 DIAGNOSIS — I480 Paroxysmal atrial fibrillation: Secondary | ICD-10-CM

## 2020-07-08 DIAGNOSIS — R0602 Shortness of breath: Secondary | ICD-10-CM

## 2020-07-08 DIAGNOSIS — Z79899 Other long term (current) drug therapy: Secondary | ICD-10-CM

## 2020-07-08 DIAGNOSIS — R05 Cough: Secondary | ICD-10-CM | POA: Diagnosis not present

## 2020-07-08 NOTE — Telephone Encounter (Addendum)
Left voice message for patient to give office a call about results, change in medication and repeat labs.  ----- Message from Minus Breeding, MD sent at 07/07/2020 12:41 PM EDT ----- BNP is mildly elevated.  I think he would tolerate 20  mg bid of the Lasix.   Repeat a BMET in two weeks.

## 2020-07-09 ENCOUNTER — Telehealth: Payer: Self-pay | Admitting: Cardiology

## 2020-07-09 DIAGNOSIS — I10 Essential (primary) hypertension: Secondary | ICD-10-CM

## 2020-07-09 DIAGNOSIS — Z5181 Encounter for therapeutic drug level monitoring: Secondary | ICD-10-CM

## 2020-07-09 MED ORDER — FUROSEMIDE 20 MG PO TABS
20.0000 mg | ORAL_TABLET | Freq: Two times a day (BID) | ORAL | 3 refills | Status: DC
Start: 2020-07-09 — End: 2020-07-31

## 2020-07-09 MED ORDER — DOXYCYCLINE HYCLATE 100 MG PO CAPS: 100.0000 mg | ORAL_CAPSULE | Freq: Two times a day (BID) | ORAL | 0 refills | Status: DC

## 2020-07-09 NOTE — Telephone Encounter (Signed)
-----   Message from Minus Breeding, MD sent at 07/07/2020 12:41 PM EDT ----- BNP is mildly elevated.  I think he would tolerate 20  mg bid of the Lasix.   Repeat a BMET in two weeks.

## 2020-07-09 NOTE — Telephone Encounter (Signed)
-----   Message from Damascus Rodriguez-Guzman, [place] sent at 07/09/2020  7:47 AM EDT ----- Based on allergies and interactions, our best option is doxycycline 100mg  BID for 7 days.  Raquel ----- Message ----- From: Minus Breeding, MD Sent: 07/08/2020   6:00 PM EDT To: Earvin Hansen, LPN, #  He has a questionable pneumonia but a long list of allergies and drug interactions.  I am going to need to prescribe an antibiotic but will ask our Pharm Ds to look over the list of drug interactions to help suggest a therapy.

## 2020-07-09 NOTE — Telephone Encounter (Signed)
Advised patient and daughter, verbalized understanding  Mailed lab forms to recheck BMET

## 2020-07-09 NOTE — Telephone Encounter (Signed)
Left message to call back    Minus Breeding, MD  07/07/2020 12:41 PM EDT     BNP is mildly elevated. I think he would tolerate 20 mg bid of the Lasix.  Repeat a BMET in two weeks.

## 2020-07-09 NOTE — Telephone Encounter (Signed)
Patient's daughter is returning phone call.  °

## 2020-07-27 ENCOUNTER — Other Ambulatory Visit: Payer: Self-pay | Admitting: Internal Medicine

## 2020-07-27 ENCOUNTER — Other Ambulatory Visit: Payer: Self-pay

## 2020-07-27 ENCOUNTER — Ambulatory Visit (HOSPITAL_COMMUNITY): Payer: Medicare Other | Attending: Cardiology

## 2020-07-27 DIAGNOSIS — R001 Bradycardia, unspecified: Secondary | ICD-10-CM | POA: Insufficient documentation

## 2020-07-27 DIAGNOSIS — I480 Paroxysmal atrial fibrillation: Secondary | ICD-10-CM | POA: Diagnosis not present

## 2020-07-27 DIAGNOSIS — R0602 Shortness of breath: Secondary | ICD-10-CM | POA: Insufficient documentation

## 2020-07-27 LAB — ECHOCARDIOGRAM COMPLETE
Area-P 1/2: 5.7 cm2
S' Lateral: 4.3 cm

## 2020-07-27 NOTE — Progress Notes (Signed)
2D echo performed.

## 2020-07-29 ENCOUNTER — Telehealth: Payer: Self-pay | Admitting: Cardiology

## 2020-07-29 DIAGNOSIS — Z6836 Body mass index (BMI) 36.0-36.9, adult: Secondary | ICD-10-CM | POA: Diagnosis not present

## 2020-07-29 DIAGNOSIS — E1129 Type 2 diabetes mellitus with other diabetic kidney complication: Secondary | ICD-10-CM | POA: Diagnosis not present

## 2020-07-29 DIAGNOSIS — R002 Palpitations: Secondary | ICD-10-CM | POA: Diagnosis not present

## 2020-07-29 DIAGNOSIS — I480 Paroxysmal atrial fibrillation: Secondary | ICD-10-CM

## 2020-07-29 DIAGNOSIS — I4891 Unspecified atrial fibrillation: Secondary | ICD-10-CM | POA: Diagnosis not present

## 2020-07-29 NOTE — Telephone Encounter (Signed)
Pts daughter, Leavy Cella [phone] , called to report that the pt has been ion and out of Afib more than usual the last few days..he has had some palpitations but his biggest complaint has been fatigue.   He is seeing his PCP Dr. Gerarda Fraction now and she will let us know if he makes any med changes.   His rate this morning was 77 but has been up to 130 the last few days.   He had an echo 07/27/20. He has been eating and drinking well and taking all of his meds.   Will forward to Dr. Percival Spanish for review.

## 2020-07-29 NOTE — Telephone Encounter (Signed)
Patient c/o Palpitations:  High priority if patient c/o lightheadedness, shortness of breath, or chest pain  1) How long have you had palpitations/irregular HR/ Afib? Are you having the symptoms now? No but did have them around 12:30am  2) Are you currently experiencing lightheadedness, SOB or CP? Not recently. Echo was done on 07/27/20 for his SOB and swelling. Swelling has gone down.  3) Do you have a history of afib (atrial fibrillation) or irregular heart rhythm? yes  4) Have you checked your BP or HR? (document readings if available): HR 77 but was getting as high as 130 the last two days and fluctuating up and down.   5) Are you experiencing any other symptoms? No. Per daughter, patient was doing his breathing exercises which normally help but they did not help this time.

## 2020-07-30 NOTE — Telephone Encounter (Signed)
I would suggest that he could be seen in Atrial Fib Clinic if they have space or by one of our APPs.  I am not in the office next week.

## 2020-07-31 NOTE — Telephone Encounter (Signed)
Spoke with daughter and Afib clinic appointment scheduled

## 2020-08-01 ENCOUNTER — Other Ambulatory Visit: Payer: Self-pay | Admitting: Cardiology

## 2020-08-03 ENCOUNTER — Ambulatory Visit (INDEPENDENT_AMBULATORY_CARE_PROVIDER_SITE_OTHER): Payer: Medicare Other | Admitting: *Deleted

## 2020-08-03 DIAGNOSIS — Z5181 Encounter for therapeutic drug level monitoring: Secondary | ICD-10-CM

## 2020-08-03 DIAGNOSIS — I4819 Other persistent atrial fibrillation: Secondary | ICD-10-CM

## 2020-08-03 LAB — POCT INR: INR: 2.4 (ref 2.0–3.0)

## 2020-08-03 NOTE — Patient Instructions (Signed)
Using CBD oil for pain Continue warfarin 1/2 tablet daily  Recheck INR in 6 weeks 

## 2020-08-04 ENCOUNTER — Other Ambulatory Visit: Payer: Self-pay

## 2020-08-04 ENCOUNTER — Encounter (HOSPITAL_COMMUNITY): Payer: Self-pay | Admitting: Nurse Practitioner

## 2020-08-04 ENCOUNTER — Ambulatory Visit (HOSPITAL_COMMUNITY): Payer: Medicare Other | Admitting: Nurse Practitioner

## 2020-08-04 ENCOUNTER — Ambulatory Visit (HOSPITAL_COMMUNITY)
Admission: RE | Admit: 2020-08-04 | Discharge: 2020-08-04 | Disposition: A | Discharge status: Home / Self Care | Payer: Medicare Other | Source: Ambulatory Visit | Attending: Nurse Practitioner | Admitting: Nurse Practitioner

## 2020-08-04 VITALS — BP 136/68 | HR 66 | Ht 69.0 in | Wt 242.4 lb

## 2020-08-04 DIAGNOSIS — I4819 Other persistent atrial fibrillation: Secondary | ICD-10-CM | POA: Diagnosis not present

## 2020-08-04 DIAGNOSIS — Z950 Presence of cardiac pacemaker: Secondary | ICD-10-CM | POA: Diagnosis not present

## 2020-08-04 DIAGNOSIS — Z880 Allergy status to penicillin: Secondary | ICD-10-CM | POA: Insufficient documentation

## 2020-08-04 DIAGNOSIS — Z882 Allergy status to sulfonamides status: Secondary | ICD-10-CM | POA: Diagnosis not present

## 2020-08-04 DIAGNOSIS — Z79899 Other long term (current) drug therapy: Secondary | ICD-10-CM | POA: Insufficient documentation

## 2020-08-04 DIAGNOSIS — Z7901 Long term (current) use of anticoagulants: Secondary | ICD-10-CM | POA: Diagnosis not present

## 2020-08-04 DIAGNOSIS — J42 Unspecified chronic bronchitis: Secondary | ICD-10-CM | POA: Insufficient documentation

## 2020-08-04 DIAGNOSIS — E162 Hypoglycemia, unspecified: Secondary | ICD-10-CM | POA: Insufficient documentation

## 2020-08-04 DIAGNOSIS — Z884 Allergy status to anesthetic agent status: Secondary | ICD-10-CM | POA: Diagnosis not present

## 2020-08-04 DIAGNOSIS — Z888 Allergy status to other drugs, medicaments and biological substances status: Secondary | ICD-10-CM | POA: Diagnosis not present

## 2020-08-04 DIAGNOSIS — Z87891 Personal history of nicotine dependence: Secondary | ICD-10-CM | POA: Diagnosis not present

## 2020-08-04 DIAGNOSIS — Z881 Allergy status to other antibiotic agents status: Secondary | ICD-10-CM | POA: Insufficient documentation

## 2020-08-04 DIAGNOSIS — Z9981 Dependence on supplemental oxygen: Secondary | ICD-10-CM | POA: Diagnosis not present

## 2020-08-04 DIAGNOSIS — I10 Essential (primary) hypertension: Secondary | ICD-10-CM | POA: Insufficient documentation

## 2020-08-04 DIAGNOSIS — I4892 Unspecified atrial flutter: Secondary | ICD-10-CM

## 2020-08-04 LAB — BASIC METABOLIC PANEL
Anion gap: 8 (ref 5–15)
BUN: 12 mg/dL (ref 8–23)
CO2: 31 mmol/L (ref 22–32)
Calcium: 9.7 mg/dL (ref 8.9–10.3)
Chloride: 101 mmol/L (ref 98–111)
Creatinine, Ser: 1.34 mg/dL — ABNORMAL HIGH (ref 0.61–1.24)
GFR calc Af Amer: 54 mL/min — ABNORMAL LOW (ref >60)
GFR calc non Af Amer: 47 mL/min — ABNORMAL LOW (ref >60)
Glucose, Bld: 97 mg/dL (ref 70–99)
Potassium: 4.2 mmol/L (ref 3.5–5.1)
Sodium: 140 mmol/L (ref 135–145)

## 2020-08-04 LAB — MAGNESIUM: Magnesium: 2.2 mg/dL (ref 1.7–2.4)

## 2020-08-04 MED ORDER — IRBESARTAN 300 MG PO TABS: 150.0000 mg | ORAL_TABLET | Freq: Every day | ORAL | Status: DC

## 2020-08-04 NOTE — Progress Notes (Addendum)
Patient ID: Chad [name], male   DOB: July 18, 1929, 86 y.o.   MRN: [id number]     Primary Care Physician: Chad Sites, MD Referring Physician: [name] f/u   Chad [name] is a 86 y.o. 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 He was  started on tikosyn 9/16 and was seen in the afib clinc for f/u in January. He was in SR, no complaints. He had prolonged qtc that day, reviewed with Dr. Caryl [name] and he felt QTC was ok. EKG repeated one week and qtc acceptable.Pt was not having any  afib that pt was aware of. He had been feeling well. He denies any new drugs or change in previous drugs. No change in general health.  He was seen in the afib clinic today, 3/16 due to complaints of having vision dim for a few seconds at a time. He feels liks he could pass out. No syncope. Started a couple of weeks ago. He can be sitting or standing. He is not aware of any sensation of heart racing or skipping with episodes. He was aware of heart racing, for short period of time last night but did not have any episodes of vision dimming with it. He has been instructed not to drive until the origin of these episodes can be further determined.30 day monitor was placed. Within a few days of wearing the monitor, an episode of elevated heart beat was noted, afib with RVR. Metoprolol was added at 25 mg a day and for the remainder of the monitor, there was no further elevated heart rate or episodes of feeling like he would pass out. No significant brady or pauses.   Returns 8/8 and feels well. No episodes of afib that he is aware of and no further episodes of RVR causing presyncopal rhythms, since starting back on metoprolol ER 25 mg a day. HR is office is 52 bpm, but he is not symptomatic with this. Has some issues with peripheral neuropathy but no falls. Continues on tikosyn bid and is compliant with warfarin.  F/u 12/21. He reports that he has had some lightheaded  spells but has not had any syncope. He just finished wearing an event monitor for these symptoms, placed by Chad [name], and pt will mail back today. Last time pt had these symptoms, monitor showed brief episodes of rapid afib and rate control was increased. He continues on Tikosyn with warfarin.  F/u 3/22. He has felt well. He had one episode of tachycardia but was associated with climbing 8 flights of stairs. No further dizziness spells. Last time he saw Dr. Caryl [name], he talked to Dr. Percival [name] re possibly stopping dofetilide and possibility of a PPM. Pt does f/u with Dr. Caryl [name] 4/9 at which time this will be further discussed. Pt was leaving for [name] right after that appointment.He is slow today at 48 bpm.  F/u in afib clinic 08/04/20. I have not seen pt since 2018. He is here as he had some elevated HR last week. He saw his PCP who increased his metoprolol succinate from  50 mg bid to 100 mg bid last Wednesday. He has not seen any elevated HR since then. He appears to be tolerating this increased dose. He continues on dofetilide 250 mcg bid. He saw Dr. Percival [name] in August with increased shortness of breath and pedal edema. His lasix was increased. He was found toan URI at that time.  He was not having any  arrhythmia then. Recent echo showed normal EF, but technically challenging study. Today, he feels well. He is in an a paced rhythm. He remains on warfarin  for a CHA2DS2VASc of at least 4.   Today, he denies symptoms of palpitations, chest pain, shortness of breath, orthopnea, PND, lower extremity edema, dizziness, presyncope, syncope, or neurologic sequela. The patient is tolerating medications without difficulties and is otherwise without complaint today.   Past Medical History:  Diagnosis Date  . Bladder cancer ([place]) 2010  . Chronic bronchitis ([place])   . Hypertension   . Hypoglycemia   . On home oxygen therapy    "2L w/CPAP at night" (06/21/2017)  . OSA on CPAP    "w/2L O2 at night" (06/21/2017)   . Persistent atrial fibrillation ([place])       . Pneumonia    "when he was young; again in 2016" (06/21/2017)  . Presence of permanent cardiac pacemaker   . Sensory ataxia    Past Surgical History:  Procedure Laterality Date  . BACK SURGERY    . CARDIOVERSION N/A 04/23/2015   Procedure: CARDIOVERSION;  Surgeon: [name], MD;  Location: [facility];  Service: Cardiovascular;  Laterality: N/A;  . CARDIOVERSION N/A 07/30/2015   Procedure: CARDIOVERSION;  Surgeon: [name], MD;  Location:  [facility] ENDOSCOPY;  Service: Cardiovascular;  Laterality: N/A;  . EYE SURGERY Bilateral 2009   "to lower pressure; no glaucoma" (06/21/2017)  . INSERT / REPLACE / REMOVE PACEMAKER  06/21/2017  . [facility]  . PACEMAKER IMPLANT N/A 06/21/2017   Procedure: Pacemaker Implant;  Surgeon: Deboraha Sprang, MD;  Location: [facility];  Service: Cardiovascular;  Laterality: N/A;  . TRANSURETHRAL RESECTION OF BLADDER TUMOR WITH GYRUS (TURBT-GYRUS)  07/10/2009    Cystourethroscopy, gyrus TURBT (transurethral  resection of bladder tumor).[name] 03/16/2011    Current Outpatient Medications  Medication Sig Dispense Refill  . Cholecalciferol (VITAMIN D3) 2000 units capsule Take 2,000 Units by mouth 2 (two) times daily.    . ciclopirox (PENLAC) 8 % solution Apply topically at bedtime. Apply over nail and surrounding skin. Apply daily over previous coat. After seven (7) days, may remove with alcohol and continue cycle. 6.6 mL 2  . Coenzyme Q10 (EQL COQ10) 100 MG capsule Take 100 mg by mouth daily.     Marland Kitchen dofetilide (TIKOSYN) 250 MCG capsule Take 1 capsule by mouth twice daily 180 capsule 0  . escitalopram (LEXAPRO) 10 MG tablet Take 10 mg by mouth at bedtime.     . furosemide (LASIX) 20 MG tablet Take 40 mg by mouth daily.     . irbesartan (AVAPRO) 300 MG tablet Take 0.5 tablets (150 mg total) by mouth daily.    . metoprolol succinate (TOPROL-XL) 50 MG 24 hr tablet Taking by mouth 2 tablets twice a day     . Multiple Vitamins-Minerals (CENTRUM SILVER PO) Take 1 tablet by mouth daily.      . OXYGEN Inhale 2 L into the lungs at bedtime. Uses with CPAP machine    . triamcinolone cream (KENALOG) 0.1 % APPLY TOPICALLY TO AFFECTED AREA(S) AFTER BATHING    . vitamin B-12 (CYANOCOBALAMIN) 500 MCG tablet Take 500 mcg by mouth 2 (two) times daily.    Marland Kitchen warfarin (COUMADIN) 5 MG tablet TAKE 1/2 TO 1 (ONE-HALF TO ONE) TABLET BY MOUTH ONCE DAILY AS DIRECTED BY COUMADIN CLINIC 90 tablet 1   No current facility-administered medications for this encounter.    Allergies  Allergen Reactions  .  Amiodarone     Caused neuropathy  . Carbapenems   . Cephalexin Other (See Comments)  . Cephalosporins Other (See Comments)  . Chlorpheniramine Other (See Comments)    Swells prostate Swells prostate  . Ciprofloxacin     Pt can not take with amiodarone   . Decongestant [Oxymetazoline]     All decongestant - makes prostate swell  . Diltiazem Swelling    Left leg swelling  . Flagyl [Metronidazole] Nausea Only    Stomach ache  . Hctz [Hydrochlorothiazide]   . Levaquin [Levofloxacin In D5w] Other (See Comments)    unknown  . Levofloxacin Nausea Only  . Lidocaine     Pt unsure of  . Penicillins Swelling    Has patient had a PCN reaction causing immediate rash, facial/tongue/throat swelling, SOB or lightheadedness with hypotension: No Has patient had a PCN reaction causing severe rash involving mucus membranes or skin necrosis: No Has patient had a PCN reaction that required hospitalization: No Has patient had a PCN reaction occurring within the last 10 years: No If all of the above answers are "NO", then may proceed with Cephalosporin use. Swelling at injection site  . Teline [Tetracycline] Swelling    Hospitalization for 2 weeks, prostate swelling  . Tetanus Toxoid Swelling    Swells arms, Please pre-medicate with benadryl.  Marland Kitchen Z-Pak [Azithromycin] Other (See Comments)    Pt can not take with Amiodarone    . Sulfonamide Derivatives Rash  . Toprol Xl [Metoprolol Tartrate] Itching and Rash    Pt states he tolerates.    Social History   Socioeconomic History  . Marital status: Married    Spouse name: [name]  . Number of children: 2  . Years of education: 6  . Highest education level: Not on file  Occupational History    Employer: RETIRED  Tobacco Use  . Smoking status: Former Smoker    Packs/day: 0.50    Years: 20.00    Pack years: 10.00    Types: Cigarettes    Quit date: 06/03/1971    Years since quitting: 49.2  . Smokeless tobacco: Never Used  Vaping Use  . Vaping Use: Never used  Substance and Sexual Activity  . Alcohol use: No  . Drug use: No  . Sexual activity: Not on file  Other Topics Concern  . Not on file  Social History Narrative   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   Patient has two children.   Patient is retired.   Patient has a college education.   Patient is right-handed.   Patient does not drink any caffeine.   Social Determinants of Health   Financial Resource Strain:   . Difficulty of Paying Living Expenses: Not on file  Food Insecurity:   . Worried About Charity fundraiser in the Last Year: Not on file  . Ran Out of Food in the Last Year: Not on file  Transportation Needs:   . Lack of Transportation (Medical): Not on file  . Lack of Transportation (Non-Medical): Not on file  Physical Activity:   . Days of Exercise per Week: Not on file  . Minutes of Exercise per Session: Not on file  Stress:   . Feeling of Stress : Not on file  Social Connections:   . Frequency of Communication with Friends and Family: Not on file  . Frequency of Social Gatherings with Friends and Family: Not on file  . Attends Religious Services: Not on file  .  Active Member of Clubs or Organizations: Not on file  . Attends Archivist Meetings: Not on file  . Marital Status: Not on file  Intimate Partner Violence:   . Fear of Current or  Ex-Partner: Not on file  . Emotionally Abused: Not on file  . Physically Abused: Not on file  . Sexually Abused: Not on file    Family History  Problem Relation Age of Onset  . Heart failure Father   . Hypertension Father   . Dementia Mother     ROS- All systems are reviewed and negative except as per the HPI above  Physical Exam: Vitals:   08/04/20 1546  BP: 136/68  Pulse: 66  Weight: 110 kg  Height: 5\' 9"  (1.753 m)    GEN- The patient is well appearing, alert and oriented x 3 today.   Head- normocephalic, atraumatic Eyes-  Sclera clear, conjunctiva pink Ears- hearing intact Oropharynx- clear Neck- supple, no JVP Lymph- no cervical lymphadenopathy Lungs- Clear to ausculation bilaterally, normal work of breathing Heart- regular rate and rhythm, no murmurs, rubs or gallops, PMI not laterally displaced GI- soft, NT, ND, + BS Extremities- no clubbing, cyanosis, or edema MS- no significant deformity or atrophy Skin- no rash or lesion Psych- euthymic mood, full affect Neuro- strength and sensation are intact  EKG- AV dual paced rhythm at 66 bpm  Echo- 1. Technically challenging study. Recommend use of echo contrast for  future studies.  2. Grossly normal LVEF, reduced sensitivity for focal wall motion  abnormalities given technical limitations.. Left ventricular ejection  fraction, by estimation, is 55 to 60%. The left ventricle has normal  function. The left ventricle has no regional  wall motion abnormalities. Left ventricular diastolic parameters are  indeterminate.  3. Right ventricular systolic function was not well visualized. The right  ventricular size is not well visualized. Tricuspid regurgitation signal is  inadequate for assessing PA pressure.  4. The mitral valve was not well visualized. Trivial mitral valve  regurgitation. No evidence of mitral stenosis. Moderate mitral annular  calcification.  5. The aortic valve was not well visualized. Aortic  valve regurgitation  is not visualized.   Assessment and Plan: 1. afib with tachy/brady syndrome  S/P PPM  He noted some irregular HR last week, confirmed with [facility] report, atrial flutter 150 bpm the 12 th thru the  15 th of September,  none since then  (BB was increased on the 15th) I agree with increased of metoprolol to 100 mg bid, he is tolerating well  Continue dofetilide 250 mcg bid  Bmet/mag today  Pt currently asymptomatic  2. Htn  Stable  3. CHA2DS2VASc score of 4 Continue  warfain   F/u Dr. Percival [name] as scheduled 12/10, afib clinic as needed    [place]. Deshaun Weisinger, [name] [address] [place], [place] [address] [phone]

## 2020-08-06 ENCOUNTER — Telehealth: Payer: Self-pay | Admitting: *Deleted

## 2020-08-06 NOTE — Telephone Encounter (Signed)
Spoke with daughter and patient has been having increased shortness of breath since last office visit Does not weigh daily, will start Swelling has improved  Taking Lasix 40 mg daily and Metoprolol 2 twice a day  Did improve slightly after last visit with Dr Percival Spanish but has gotten worse Will forward to Dr Percival Spanish for review

## 2020-08-06 NOTE — Telephone Encounter (Signed)
He did have some atelectasis on CXR but they could not exclude a pneumonia.  He was not having fevers, chills cough but probably should be seen at an urgent care or by his PCP to follow up that CXR if he is getting worse.

## 2020-08-06 NOTE — Telephone Encounter (Signed)
Advised daughter, verbalized understanding  

## 2020-08-07 ENCOUNTER — Other Ambulatory Visit: Payer: Self-pay

## 2020-08-07 ENCOUNTER — Telehealth: Payer: Self-pay | Admitting: Cardiology

## 2020-08-07 ENCOUNTER — Other Ambulatory Visit (HOSPITAL_COMMUNITY): Payer: Self-pay | Admitting: Family Medicine

## 2020-08-07 ENCOUNTER — Ambulatory Visit (HOSPITAL_COMMUNITY)
Admission: RE | Admit: 2020-08-07 | Discharge: 2020-08-07 | Disposition: A | Discharge status: Home / Self Care | Payer: Medicare Other | Source: Ambulatory Visit | Attending: Family Medicine | Admitting: Family Medicine

## 2020-08-07 DIAGNOSIS — R05 Cough: Secondary | ICD-10-CM | POA: Insufficient documentation

## 2020-08-07 DIAGNOSIS — Z6837 Body mass index (BMI) 37.0-37.9, adult: Secondary | ICD-10-CM | POA: Diagnosis not present

## 2020-08-07 DIAGNOSIS — G64 Other disorders of peripheral nervous system: Secondary | ICD-10-CM | POA: Diagnosis not present

## 2020-08-07 DIAGNOSIS — I10 Essential (primary) hypertension: Secondary | ICD-10-CM | POA: Diagnosis not present

## 2020-08-07 DIAGNOSIS — R918 Other nonspecific abnormal finding of lung field: Secondary | ICD-10-CM | POA: Diagnosis not present

## 2020-08-07 DIAGNOSIS — R059 Cough, unspecified: Secondary | ICD-10-CM

## 2020-08-07 DIAGNOSIS — R0602 Shortness of breath: Secondary | ICD-10-CM | POA: Diagnosis not present

## 2020-08-07 NOTE — Telephone Encounter (Signed)
Agree,  Urgent care should be able to get a cxr

## 2020-08-07 NOTE — Telephone Encounter (Signed)
New message:     Patient daughter calling to see if she could get a order for her father for a chest xray at [facility].

## 2020-08-07 NOTE — Telephone Encounter (Signed)
Called patient's daughter about her message. Patient's daughter is wanting Dr. Percival Spanish to order another chest xray since patient is not improving. Informed patient's daughter in a message from Dr. Percival Spanish yesterday (08/06/20) "probably should be seen at an urgent care or by his PCP to follow up that CXR if he is getting worse." Patient's daughter stated patient saw his PCP last week and all he did was give him metoprolol. Encouraged patient's daughter to take Dr. Rosezella Florida advise and go to urgent care since she is not satisfied with the visit with PCP and patient's SOB is not improving. Patient's daughter stated she was already planning to go to Urgent Care, but wanted to get CXR ordered from [facility] since they are so quick. Informed her that it would be best for patient to get evaluated first at urgent care and let the provider at urgent care decide if patient needs another CXR and they can order a CXR at [facility]. Patient's daughter agreed to plan. Will send message to Dr. Percival Spanish for further advisement.

## 2020-08-10 ENCOUNTER — Telehealth: Payer: Self-pay | Admitting: Cardiology

## 2020-08-10 NOTE — Telephone Encounter (Signed)
Patient was given a Z pac today. Patient is on warfarin. Daughter called concerned about mixing the Z-Pac and warfarin.  Please call [phone].

## 2020-08-10 NOTE — Telephone Encounter (Signed)
Called and spoke to pt's daughter and made her aware that the Zpack can interfere with warfarin. She stated that pt will be on a Zpack for 1 week. Instructed her that pt should try to eat some extra servings of greens while he is on the antibiotic. Scheduled pt an appointment to have INR checked on 10/1.

## 2020-08-14 ENCOUNTER — Ambulatory Visit (INDEPENDENT_AMBULATORY_CARE_PROVIDER_SITE_OTHER): Payer: Medicare Other | Admitting: *Deleted

## 2020-08-14 DIAGNOSIS — Z5181 Encounter for therapeutic drug level monitoring: Secondary | ICD-10-CM

## 2020-08-14 DIAGNOSIS — I4819 Other persistent atrial fibrillation: Secondary | ICD-10-CM

## 2020-08-14 LAB — POCT INR: INR: 2 (ref 2.0–3.0)

## 2020-08-14 NOTE — Patient Instructions (Signed)
Using CBD oil for pain Continue warfarin 1/2 tablet daily  Recheck INR in 6 weeks 

## 2020-08-15 ENCOUNTER — Other Ambulatory Visit: Payer: Self-pay | Admitting: Internal Medicine

## 2020-08-21 DIAGNOSIS — H40022 Open angle with borderline findings, high risk, left eye: Secondary | ICD-10-CM | POA: Diagnosis not present

## 2020-08-21 DIAGNOSIS — Z961 Presence of intraocular lens: Secondary | ICD-10-CM | POA: Diagnosis not present

## 2020-09-01 ENCOUNTER — Ambulatory Visit: Payer: Medicare Other | Admitting: Podiatry

## 2020-09-03 DIAGNOSIS — Z950 Presence of cardiac pacemaker: Secondary | ICD-10-CM | POA: Insufficient documentation

## 2020-09-08 ENCOUNTER — Other Ambulatory Visit: Payer: Self-pay

## 2020-09-08 ENCOUNTER — Encounter: Payer: Self-pay | Admitting: Internal Medicine

## 2020-09-08 ENCOUNTER — Ambulatory Visit: Payer: Medicare Other | Admitting: Internal Medicine

## 2020-09-08 VITALS — BP 122/72 | HR 69 | Ht 69.0 in | Wt 242.0 lb

## 2020-09-08 DIAGNOSIS — R0609 Other forms of dyspnea: Secondary | ICD-10-CM

## 2020-09-08 DIAGNOSIS — I4819 Other persistent atrial fibrillation: Secondary | ICD-10-CM

## 2020-09-08 DIAGNOSIS — Z950 Presence of cardiac pacemaker: Secondary | ICD-10-CM | POA: Diagnosis not present

## 2020-09-08 DIAGNOSIS — I495 Sick sinus syndrome: Secondary | ICD-10-CM

## 2020-09-08 DIAGNOSIS — I471 Supraventricular tachycardia: Secondary | ICD-10-CM

## 2020-09-08 DIAGNOSIS — R0600 Dyspnea, unspecified: Secondary | ICD-10-CM

## 2020-09-08 NOTE — Progress Notes (Signed)
Patient Care Team: Redmond School, MD as PCP - General (Internal Medicine) Minus Breeding, MD as PCP - Cardiology (Cardiology)   HPI  Chad [name] is a 86 y.o. male Seen in follow-up for pacing [facility] [employer]  undertaken 2018 for sinus node dysfunction.  He also has atrial fibrillation nonsustained ventricular tachycardia; anticoagulated with warfarin.  Currently taking dofetilide  Has seen both [facility] as well and has A. fib clinic in the last 2 months because of dyspnea and or rapid heart rate..  Been in sinus on both occasions.  And then which one-point across the eyebrow The patient denies chest pain,.  There have been lightheadedness or syncope.   He developed shortness of breath 8/21.  Saw Dr. Lenore Cordia as noted.  Evaluation as below.  No clear explanation and no significant improvement not withstanding efforts to diurese associated with less peripheral edema.   Wife of 61 years died is being very tomorrow.  Her name is Chad [name], Chad [name].  Younger sister of his buddies   DATE TEST EF   1/15 Echo   60-65 %   9/21 Echo  55-60%      Records and Results Reviewed labs from ER vsist  Date Cr K Mg Hgb  7/18  1.16 4.5 2.2 15.2  11/18  1.06 4.5 2.3   3/20 1.12 4.4  14.0      He is Past Medical History:  Diagnosis Date  . Bladder cancer ([place]) 2010  . Chronic bronchitis ([place])   . Hypertension   . Hypoglycemia   . On home oxygen therapy    "2L w/CPAP at night" (06/21/2017)  . OSA on CPAP    "w/2L O2 at night" (06/21/2017)  . Persistent atrial fibrillation ([place])       . Pneumonia    "when he was young; again in 2016" (06/21/2017)  . Presence of permanent cardiac pacemaker   . Sensory ataxia     Past Surgical History:  Procedure Laterality Date  . BACK SURGERY    . CARDIOVERSION N/A 04/23/2015   Procedure: CARDIOVERSION;  Surgeon: [name], MD;  Location: [facility];  Service: Cardiovascular;  Laterality: N/A;  . CARDIOVERSION N/A 07/30/2015   Procedure: CARDIOVERSION;  Surgeon: [name], MD;  Location: [facility] ENDOSCOPY;  Service: Cardiovascular;  Laterality: N/A;  . EYE SURGERY Bilateral 2009   "to lower pressure; no glaucoma" (06/21/2017)  . INSERT / REPLACE / REMOVE PACEMAKER  06/21/2017  . [facility]  . PACEMAKER IMPLANT N/A 06/21/2017   Procedure: Pacemaker Implant;  Surgeon: Deboraha Sprang, MD;  Location: [facility] LAB;  Service: Cardiovascular;  Laterality: N/A;  . TRANSURETHRAL RESECTION OF BLADDER TUMOR WITH GYRUS (TURBT-GYRUS)  07/10/2009    Cystourethroscopy, gyrus TURBT (transurethral  resection of bladder tumor).[name] 03/16/2011    Current Outpatient Medications  Medication Sig Dispense Refill  . Cholecalciferol (VITAMIN D3) 2000 units capsule Take 2,000 Units by mouth 2 (two) times daily.    . ciclopirox (PENLAC) 8 % solution Apply topically at bedtime. Apply over nail and surrounding skin. Apply daily over previous coat. After seven (7) days, may remove with alcohol and continue cycle. 6.6 mL 2  . Coenzyme Q10 (EQL COQ10) 100 MG capsule Take 100 mg by mouth daily.     Marland Kitchen dofetilide (TIKOSYN) 250 MCG capsule Take 1 capsule by mouth twice daily 180 capsule 0  . escitalopram (LEXAPRO) 10 MG tablet Take 10 mg by mouth at bedtime.     Marland Kitchen  furosemide (LASIX) 20 MG tablet Take 40 mg by mouth daily.     . irbesartan (AVAPRO) 300 MG tablet Take 0.5 tablets (150 mg total) by mouth daily.    . metoprolol succinate (TOPROL-XL) 50 MG 24 hr tablet TAKE 2 TABLETS BY MOUTH ONCE DAILY IN THE MORNING AND 1 TABLET ONCE DAILY IN THE EVENING (Patient taking differently: 50 mg in the morning and at bedtime. 2 tabs am. (100mg )  2 tabs pm) 270 tablet 0  . Multiple Vitamins-Minerals (CENTRUM SILVER PO) Take 1 tablet by mouth daily.      . OXYGEN Inhale 2 L into the lungs at bedtime. Uses with CPAP machine    . triamcinolone cream (KENALOG) 0.1 % as needed.     . vitamin B-12 (CYANOCOBALAMIN) 500 MCG tablet Take 500 mcg by mouth 2 (two) times daily.    Marland Kitchen warfarin  (COUMADIN) 5 MG tablet TAKE 1/2 TO 1 (ONE-HALF TO ONE) TABLET BY MOUTH ONCE DAILY AS DIRECTED BY COUMADIN CLINIC 90 tablet 1   No current facility-administered medications for this visit.    Allergies  Allergen Reactions  . Amiodarone     Caused neuropathy  . Carbapenems   . Cephalexin Other (See Comments)  . Cephalosporins Other (See Comments)  . Chlorpheniramine Other (See Comments)    Swells prostate Swells prostate  . Ciprofloxacin     Pt can not take with amiodarone   . Decongestant [Oxymetazoline]     All decongestant - makes prostate swell  . Diltiazem Swelling    Left leg swelling  . Flagyl [Metronidazole] Nausea Only    Stomach ache  . Hctz [Hydrochlorothiazide]   . Levaquin [Levofloxacin In D5w] Other (See Comments)    unknown  . Levofloxacin Nausea Only  . Lidocaine     Pt unsure of  . Penicillins Swelling    Has patient had a PCN reaction causing immediate rash, facial/tongue/throat swelling, SOB or lightheadedness with hypotension: No Has patient had a PCN reaction causing severe rash involving mucus membranes or skin necrosis: No Has patient had a PCN reaction that required hospitalization: No Has patient had a PCN reaction occurring within the last 10 years: No If all of the above answers are "NO", then may proceed with Cephalosporin use. Swelling at injection site  . Teline [Tetracycline] Swelling    Hospitalization for 2 weeks, prostate swelling  . Tetanus Toxoid Swelling    Swells arms, Please pre-medicate with benadryl.  Marland Kitchen Z-Pak [Azithromycin] Other (See Comments)    Pt can not take with Amiodarone   . Sulfonamide Derivatives Rash  . Toprol Xl [Metoprolol Tartrate] Itching and Rash    Pt states he tolerates.      Review of Systems negative except from HPI and PMH  Physical Exam BP 122/72   Pulse 69   Ht 5\' 9"  (1.753 m)   Wt 242 lb (109.8 kg)   SpO2 96%   BMI 35.74 kg/m  Well developed and well nourished in mild respiratory  distress HENT  normal Neck supple with JVP-7-8 cm Clear Device pocket well healed; without hematoma or erythema.  There is no tethering  Regular rate and rhythm, no murmur Abd-soft with active BS No Clubbing cyanosis  edema Skin-warm and dry A & Oriented  Grossly normal sensory and motor function  ECG atrial pacing with intermittent and frequent atrial ectopy Intervals 20/14/42 with right bundle branch block   Assessment and  Plan  VT-NS  Atrial fib  RVR  Atrial tachycardia-slow  Sinus bradycardia  Pacemaker /[facility] Jude   Morbidly obese  OSA treated  HFpEF  SVT  Neuropathy    Complaints of dyspnea dating back to August.  Evaluation has been unrevealing with possible pneumonia treated with antibiotics x2 without improvement BNP which is modestly elevated at 119. Heart rate excursion on his device is reasonable.  Resting O2 sat went from 91--89 with walking  He is doing about 20% atrially non-paced going back about 1 year. These nonpaced events represent probably a slow atrial tachycardia as they interpolated themselves into pacing repeatedly. This has not changed.  I do not see a rhythm explanation for his dyspnea. I will arrange for a high-resolution CT scan and then perhaps further evaluation by his PCP can be directed.  He has a history of radiation exposure for many years.  Remote Myoview were negative.  Perhaps he is dyspnea is an anginal equivalent.  We will undertake Lexiscan I am also struck by his resting oxygen saturation being 89-90.    Doing pretty well following the death of his wife a year ago.        Current medicines are reviewed at length with the patient today .  The patient does not  have concerns regarding medicines.

## 2020-09-08 NOTE — Patient Instructions (Signed)
Medication Instructions:  Your physician recommends that you continue on your current medications as directed. Please refer to the Current Medication list given to you today.  *If you need a refill on your cardiac medications before your next appointment, please call your pharmacy*   Lab Work: None ordered.  If you have labs (blood work) drawn today and your tests are completely normal, you will receive your results only by: Marland Kitchen MyChart Message (if you have MyChart) OR . A paper copy in the mail If you have any lab test that is abnormal or we need to change your treatment, we will call you to review the results.   Testing/Procedures: Your physician has requested that you have a lexiscan myoview. For further information please visit [email or web address]. Please follow instruction sheet, as given.  Non-Cardiac CT scanning, (CAT scanning), is a noninvasive, special x-ray that produces cross-sectional images of the body using x-rays and a computer. CT scans help physicians diagnose and treat medical conditions. For some CT exams, a contrast material is used to enhance visibility in the area of the body being studied. CT scans provide greater clarity and reveal more details than regular x-ray exams.     Follow-Up: At [facility], you and your health needs are our priority.  As part of our continuing mission to provide you with exceptional heart care, we have created designated Provider Care Teams.  These Care Teams include your primary Cardiologist (physician) and Advanced Practice Providers (APPs -  Physician Assistants and Nurse Practitioners) who all work together to provide you with the care you need, when you need it.  We recommend signing up for the patient portal called "MyChart".  Sign up information is provided on this After Visit Summary.  MyChart is used to connect with patients for Virtual Visits (Telemedicine).  Patients are able to view lab/test results, encounter notes, upcoming  appointments, etc.  Non-urgent messages can be sent to your provider as well.   To learn more about what you can do with MyChart, go to [email or web address].    Your next appointment:   6 month(s)  The format for your next appointment:   In Person  Provider:   Virl Axe, MD

## 2020-09-15 ENCOUNTER — Telehealth (HOSPITAL_COMMUNITY): Payer: Self-pay

## 2020-09-15 NOTE — Telephone Encounter (Signed)
Detailed instructions left on the patient's answering machine. Asked to call back with any questions. S.Kaya Pottenger EMTP 

## 2020-09-21 ENCOUNTER — Telehealth: Payer: Self-pay | Admitting: Internal Medicine

## 2020-09-21 NOTE — Telephone Encounter (Signed)
Spoke with pt's daughter, DPR who states she and pt are nervous re: pt's Myoview tomorrow d/t pt's age.  Advised pt's choice to have testing but he will not have to exercise as test ordered by Dr Caryl Comes is a chemical testing.  Reviewed instructions with pt's daughter.  Pt's daughter states she will discuss again with pt and if he decides not to move forward with Lexiscan they will callback this afternoon.  Pt's daughter thanked Therapist, sports for the call.

## 2020-09-21 NOTE — Telephone Encounter (Signed)
Patient's daughter calling to speak with a nurse, because she states she and the patient are very nervous about his stress test since he is almost 86 years old.

## 2020-09-22 ENCOUNTER — Other Ambulatory Visit: Payer: Self-pay

## 2020-09-22 ENCOUNTER — Ambulatory Visit (HOSPITAL_COMMUNITY): Payer: Medicare Other | Attending: Cardiology

## 2020-09-22 ENCOUNTER — Ambulatory Visit (INDEPENDENT_AMBULATORY_CARE_PROVIDER_SITE_OTHER)
Admission: RE | Admit: 2020-09-22 | Discharge: 2020-09-22 | Disposition: A | Discharge status: Home / Self Care | Payer: Medicare Other | Source: Ambulatory Visit | Attending: Internal Medicine | Admitting: Internal Medicine

## 2020-09-22 DIAGNOSIS — R0600 Dyspnea, unspecified: Secondary | ICD-10-CM | POA: Insufficient documentation

## 2020-09-22 DIAGNOSIS — I313 Pericardial effusion (noninflammatory): Secondary | ICD-10-CM | POA: Diagnosis not present

## 2020-09-22 DIAGNOSIS — R0609 Other forms of dyspnea: Secondary | ICD-10-CM

## 2020-09-22 DIAGNOSIS — I2510 Atherosclerotic heart disease of native coronary artery without angina pectoris: Secondary | ICD-10-CM | POA: Diagnosis not present

## 2020-09-22 DIAGNOSIS — I4891 Unspecified atrial fibrillation: Secondary | ICD-10-CM | POA: Diagnosis not present

## 2020-09-22 LAB — MYOCARDIAL PERFUSION IMAGING
LV dias vol: 49 mL (ref 62–150)
LV sys vol: 20 mL
Peak HR: 81 {beats}/min
Rest HR: 67 {beats}/min
SDS: 0
SRS: 2
SSS: 2
TID: 1.12

## 2020-09-22 MED ORDER — REGADENOSON 0.4 MG/5ML IV SOLN
0.4000 mg | Freq: Once | INTRAVENOUS | Status: AC
  Administered 2020-09-22: 0.4 mg via INTRAVENOUS

## 2020-09-22 MED ORDER — TECHNETIUM TC 99M TETROFOSMIN IV KIT
10.4000 | PACK | Freq: Once | INTRAVENOUS | Status: AC | PRN
  Administered 2020-09-22: 10.4 via INTRAVENOUS
  Filled 2020-09-22: qty 11

## 2020-09-22 MED ORDER — TECHNETIUM TC 99M TETROFOSMIN IV KIT
31.4000 | PACK | Freq: Once | INTRAVENOUS | Status: AC | PRN
  Administered 2020-09-22: 31.4 via INTRAVENOUS
  Filled 2020-09-22: qty 32

## 2020-09-23 ENCOUNTER — Ambulatory Visit (INDEPENDENT_AMBULATORY_CARE_PROVIDER_SITE_OTHER): Payer: Medicare Other | Admitting: *Deleted

## 2020-09-23 ENCOUNTER — Telehealth: Payer: Self-pay

## 2020-09-23 DIAGNOSIS — I4819 Other persistent atrial fibrillation: Secondary | ICD-10-CM | POA: Diagnosis not present

## 2020-09-23 DIAGNOSIS — J84112 Idiopathic pulmonary fibrosis: Secondary | ICD-10-CM

## 2020-09-23 DIAGNOSIS — Z5181 Encounter for therapeutic drug level monitoring: Secondary | ICD-10-CM

## 2020-09-23 DIAGNOSIS — R9389 Abnormal findings on diagnostic imaging of other specified body structures: Secondary | ICD-10-CM

## 2020-09-23 LAB — POCT INR: INR: 2.4 (ref 2.0–3.0)

## 2020-09-23 NOTE — Patient Instructions (Signed)
Using CBD oil for pain Continue warfarin 1/2 tablet daily  Recheck INR in 6 weeks

## 2020-09-23 NOTE — Telephone Encounter (Signed)
Spoke with pt's daughter. DPR and advised of Dr Olin Pia comment on pt's CT of chest.  See comment below.  Pt's daughter states she will discuss with her father but to go ahead and make referral to Pulmonary as Dr Caryl Comes suggested.  Order placed as requested.  Pt's daughter verbalizes understanding and agrees with current plan.  Encouraged pt's daughter to sign up pt for MyChart.  Pt's daughter thanked Therapist, sports for call.      Deboraha Sprang, MD 09/22/2020 8:39 PM EST Back to Top    Please Inform Patient that -CT is abnormal and will require further eval either by his PCP or if he prefers we can refer to pulmonary

## 2020-09-24 DIAGNOSIS — G4733 Obstructive sleep apnea (adult) (pediatric): Secondary | ICD-10-CM | POA: Diagnosis not present

## 2020-09-24 DIAGNOSIS — I4891 Unspecified atrial fibrillation: Secondary | ICD-10-CM | POA: Diagnosis not present

## 2020-09-28 ENCOUNTER — Telehealth: Payer: Self-pay | Admitting: Cardiology

## 2020-09-28 NOTE — Telephone Encounter (Signed)
Patient's daughter states is requesting to have a referral faxed to patient's sleep study provider, Dr. Asencion Partridge Dohmeier, as it has been over 3 years since the patient was seen. Please assist.

## 2020-09-28 NOTE — Telephone Encounter (Signed)
She will call PCP for referral

## 2020-09-29 ENCOUNTER — Telehealth: Payer: Self-pay | Admitting: Internal Medicine

## 2020-09-29 ENCOUNTER — Ambulatory Visit (INDEPENDENT_AMBULATORY_CARE_PROVIDER_SITE_OTHER): Payer: Medicare Other

## 2020-09-29 DIAGNOSIS — I495 Sick sinus syndrome: Secondary | ICD-10-CM

## 2020-09-29 LAB — CUP PACEART REMOTE DEVICE CHECK
Battery Remaining Longevity: 112 mo
Battery Remaining Percentage: 95.5 %
Battery Voltage: 2.99 V
Brady Statistic AP VP Percent: 18 %
Brady Statistic AP VS Percent: 52 %
Brady Statistic AS VP Percent: 3.4 %
Brady Statistic AS VS Percent: 25 %
Brady Statistic RA Percent Paced: 64 %
Brady Statistic RV Percent Paced: 21 %
Date Time Interrogation Session: 20211116045301
Implantable Lead Implant Date: 20180808
Implantable Lead Implant Date: 20180808
Implantable Lead Location: 753859
Implantable Lead Location: 753860
Implantable Lead Model: 5076
Implantable Lead Model: 5076
Implantable Pulse Generator Implant Date: 20180808
Lead Channel Impedance Value: 450 Ohm
Lead Channel Impedance Value: 480 Ohm
Lead Channel Pacing Threshold Amplitude: 0.5 V
Lead Channel Pacing Threshold Amplitude: 0.75 V
Lead Channel Pacing Threshold Pulse Width: 0.5 ms
Lead Channel Pacing Threshold Pulse Width: 0.5 ms
Lead Channel Sensing Intrinsic Amplitude: 12 mV
Lead Channel Sensing Intrinsic Amplitude: 2.7 mV
Lead Channel Setting Pacing Amplitude: 2 V
Lead Channel Setting Pacing Amplitude: 2.5 V
Lead Channel Setting Pacing Pulse Width: 0.5 ms
Lead Channel Setting Sensing Sensitivity: 2 mV
Pulse Gen Model: 2272
Pulse Gen Serial Number: 8932564

## 2020-09-29 NOTE — Telephone Encounter (Signed)
Spoke with pt and pt's daughter Chad [name], [place] and advised per Dr Caryl Comes cardiac study shows:  No evidence of ischemia normal* heart muscle function  His cardiac evaluation has not been so helpful CT abnormal and piulm referral placed   Pulmonology referral is active and ready for scheduling per Epic note.  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   Encouraged pt's daughter to discuss these questions with Dr Percival Spanish and pt's PCP.  Pt's daughter verbalizes understanding and agrees with current plan.

## 2020-09-29 NOTE — Telephone Encounter (Signed)
Patient daughter called and stated that she needs to ask Dr. Caryl Comes or nurse some questions regarding the patient. Please call back

## 2020-10-01 NOTE — Progress Notes (Signed)
Remote pacemaker transmission.   

## 2020-10-06 ENCOUNTER — Ambulatory Visit: Payer: Medicare Other | Admitting: Podiatry

## 2020-10-06 ENCOUNTER — Other Ambulatory Visit: Payer: Self-pay

## 2020-10-06 DIAGNOSIS — M79675 Pain in left toe(s): Secondary | ICD-10-CM | POA: Diagnosis not present

## 2020-10-06 DIAGNOSIS — M79674 Pain in right toe(s): Secondary | ICD-10-CM

## 2020-10-06 DIAGNOSIS — Z7901 Long term (current) use of anticoagulants: Secondary | ICD-10-CM

## 2020-10-06 DIAGNOSIS — B351 Tinea unguium: Secondary | ICD-10-CM

## 2020-10-12 DIAGNOSIS — L821 Other seborrheic keratosis: Secondary | ICD-10-CM | POA: Diagnosis not present

## 2020-10-12 DIAGNOSIS — D692 Other nonthrombocytopenic purpura: Secondary | ICD-10-CM | POA: Diagnosis not present

## 2020-10-12 DIAGNOSIS — D1801 Hemangioma of skin and subcutaneous tissue: Secondary | ICD-10-CM | POA: Diagnosis not present

## 2020-10-12 DIAGNOSIS — L720 Epidermal cyst: Secondary | ICD-10-CM | POA: Diagnosis not present

## 2020-10-12 NOTE — Progress Notes (Signed)
Subjective: 86 y.o. returns the office today for painful, elongated, thickened toenails which they cannot trim themself. Denies any redness or drainage around the nails. He has been using the topical antifungal. Denies any systemic complaints such as fevers, chills, nausea, vomiting.   He is on Coumadin  PCP: [name], MD   Objective: AAO 3, NAD DP/PT pulses palpable, CRT less than 3 seconds Nails hypertrophic, dystrophic, elongated, brittle, discolored 10. There is tenderness overlying the nails 1-5 bilaterally. There is no surrounding erythema or drainage along the nail sites. No open lesions or pre-ulcerative lesions are identified. No pain with calf compression, swelling, warmth, erythema.  Assessment: Patient presents with symptomatic onychomycosis  Plan: -Treatment options including alternatives, risks, complications were discussed -Nails sharply debrided 10 without complication/bleeding. -Discussed daily foot inspection. If there are any changes, to call the office immediately.  -Follow-up in 3 months or sooner if any problems are to arise. In the meantime, encouraged to call the office with any questions, concerns, changes symptoms.  Celesta Gentile, DPM

## 2020-10-22 ENCOUNTER — Other Ambulatory Visit: Payer: Self-pay

## 2020-10-22 ENCOUNTER — Ambulatory Visit: Payer: Medicare Other | Admitting: Pulmonary Disease

## 2020-10-22 ENCOUNTER — Encounter: Payer: Self-pay | Admitting: Pulmonary Disease

## 2020-10-22 VITALS — BP 124/66 | HR 56 | Ht 70.0 in | Wt 248.2 lb

## 2020-10-22 DIAGNOSIS — J84112 Idiopathic pulmonary fibrosis: Secondary | ICD-10-CM

## 2020-10-22 DIAGNOSIS — Z23 Encounter for immunization: Secondary | ICD-10-CM

## 2020-10-22 NOTE — Patient Instructions (Signed)
Blood work today  PFT or breathing tests in coming days to weeks.  Follow up with Dr. Silas Flood in 3 months.

## 2020-10-22 NOTE — Progress Notes (Signed)
Cardiology Office Note   Date:  10/23/2020   ID:  Chad [name], DOB [birth year], MRN [id number]  PCP:  Redmond School, MD  Cardiologist:   Minus Breeding, MD   Chief Complaint  Patient presents with  . Shortness of Breath      History of Present Illness: Chad [name] is a 86 y.o. male who presents for follow up of atrial fib.   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  He has been treated for SVT as well by Dr. Caryl Comes.   He saw Dr. Caryl Comes this fall and had a high resolution CT with possible usual interstitial pneumonitis.  He was referred to pulmonary.    He reports that he does actually feel better.  He walks with a cane.  He does his grocery shopping.  He denies any cardiovascular symptoms such as PND or orthopnea he sleeps with his CPAP with oxygen.  He has not had any weight gain or edema.  He does not really notice any palpitations, presyncope or syncope.  He is not having any chest pressure, neck or arm discomfort.   Past Medical History:  Diagnosis Date  . Bladder cancer ([place]) 2010  . Chronic bronchitis ([place])   . Hypertension   . Hypoglycemia   . On home oxygen therapy    "2L w/CPAP at night" (06/21/2017)  . OSA on CPAP    "w/2L O2 at night" (06/21/2017)  . Persistent atrial fibrillation ([place])       . Pneumonia    "when he was young; again in 2016" (06/21/2017)  . Presence of permanent cardiac pacemaker   . Sensory ataxia     Past Surgical History:  Procedure Laterality Date  . BACK SURGERY    . CARDIOVERSION N/A 04/23/2015   Procedure: CARDIOVERSION;  Surgeon: [name], MD;  Location: [facility];  Service: Cardiovascular;  Laterality: N/A;  . CARDIOVERSION N/A 07/30/2015   Procedure: CARDIOVERSION;  Surgeon: [name], MD;  Location: [facility] ENDOSCOPY;  Service: Cardiovascular;  Laterality: N/A;  . EYE SURGERY Bilateral 2009   "to lower pressure; no glaucoma" (06/21/2017)  . INSERT / REPLACE / REMOVE PACEMAKER  06/21/2017  . [place]  . PACEMAKER IMPLANT N/A 06/21/2017   Procedure: Pacemaker Implant;  Surgeon: Deboraha Sprang, MD;  Location: [facility] CV [facility];  Service: Cardiovascular;  Laterality: N/A;  . TRANSURETHRAL RESECTION OF BLADDER TUMOR WITH GYRUS (TURBT-GYRUS)  07/10/2009    Cystourethroscopy, gyrus TURBT (transurethral  resection of bladder tumor).[name] 03/16/2011     Current Outpatient Medications  Medication Sig Dispense Refill  . Cholecalciferol (VITAMIN D3) 2000 units capsule Take 2,000 Units by mouth 2 (two) times daily.    . ciclopirox (PENLAC) 8 % solution Apply topically at bedtime. Apply over nail and surrounding skin. Apply daily over previous coat. After seven (7) days, may remove with alcohol and continue cycle. 6.6 mL 2  . Coenzyme Q10 100 MG capsule Take 100 mg by mouth daily.    Marland Kitchen dofetilide (TIKOSYN) 250 MCG capsule Take 1 capsule by mouth twice daily 180 capsule 2  . escitalopram (LEXAPRO) 10 MG tablet Take 10 mg by mouth at bedtime.    . furosemide (LASIX) 20 MG tablet Take 40 mg by mouth daily.     . irbesartan (AVAPRO) 300 MG tablet Take 0.5 tablets (150 mg total) by mouth daily.    . Multiple Vitamins-Minerals (CENTRUM SILVER PO) Take 1 tablet by mouth  daily.    . OXYGEN Inhale 2 L into the lungs at bedtime. Uses with CPAP machine    . triamcinolone cream (KENALOG) 0.1 % as needed.     . vitamin B-12 (CYANOCOBALAMIN) 500 MCG tablet Take 500 mcg by mouth 2 (two) times daily.    Marland Kitchen warfarin (COUMADIN) 5 MG tablet TAKE 1/2 TO 1 (ONE-HALF TO ONE) TABLET BY MOUTH ONCE DAILY AS DIRECTED BY COUMADIN CLINIC 90 tablet 1  . metoprolol succinate (TOPROL-XL) 50 MG 24 hr tablet Take 1 tablet (50 mg total) by mouth in the morning and at bedtime. 60 tablet 11   No current facility-administered medications for this visit.    Allergies:   Amiodarone, Carbapenems, Cephalexin, Cephalosporins, Chlorpheniramine, Ciprofloxacin, Decongestant [oxymetazoline], Diltiazem, Flagyl [metronidazole], Hctz  [hydrochlorothiazide], Levaquin [levofloxacin in d5w], Levofloxacin, Lidocaine, Penicillins, Teline [tetracycline], Tetanus toxoid, Z-pak [azithromycin], Sulfonamide derivatives, and Toprol xl [metoprolol tartrate]    ROS:  Please see the history of present illness.   Otherwise, review of systems are positive for none.   All other systems are reviewed and negative.    PHYSICAL EXAM: VS:  BP 130/72   Pulse 64   Ht 5\' 10"  (1.778 m)   Wt 251 lb (113.9 kg)   SpO2 97%   BMI 36.01 kg/m  , BMI Body mass index is 36.01 kg/m. GENERAL:  Well appearing NECK:  No jugular venous distention, waveform within normal limits, carotid upstroke brisk and symmetric, no bruits, no thyromegaly LUNGS:  Clear to auscultation bilaterally CHEST:  Unremarkable HEART:  PMI not displaced or sustained,S1 and S2 within normal limits, no S3, no S4, no clicks, no rubs, no murmurs ABD:  Flat, positive bowel sounds normal in frequency in pitch, no bruits, no rebound, no guarding, no midline pulsatile mass, no hepatomegaly, no splenomegaly EXT:  2 plus pulses throughout, no edema, no cyanosis no clubbing  EKG: NA  Recent Labs: 07/06/2020: BNP 119.9 08/04/2020: BUN 12; Creatinine, Ser 1.34; Magnesium 2.2; Potassium 4.2; Sodium 140    Lipid Panel    Component Value Date/Time   CHOL 179 06/10/2010 1617   TRIG 75 06/10/2010 1617   HDL 46 06/10/2010 1617   LDLCALC 118 06/10/2010 1617      Wt Readings from Last 3 Encounters:  10/23/20 251 lb (113.9 kg)  10/22/20 248 lb 3.2 oz (112.6 kg)  09/22/20 242 lb (109.8 kg)      Other studies Reviewed: Additional studies/ records that were reviewed today include: Pulmonary notes Review of the above records demonstrates:  See elsewhere   ASSESSMENT AND PLAN:   SOB:  He saw pulmonary today.  He was told that he might have some interstitial fibrosis apparently but it did not require treatment and sounds like pulmonary function testing is ordered and may be pulmonary  rehab.  I do not see that note yet.  He did have a negative perfusion study.   He is family thinks that maybe his shortness of breath got worse when he had increase beta-blocker.  Not clear why the dose was increased to 100 twice a day so I will change it back to 50 mg twice a day.  ATRIAL FIB:  Chad [name] has a CHA2DS2 - VASc score of 3.  He has less than 1% burden on his device and I look at this.  HTN:  The blood pressure is at target.  I will reduce his metoprolol as described and he can keep an eye on his blood pressure.  HFpEF:    He seems to be euvolemic.  No change in therapy.   SVT:   He has had no symptomatic tachypalpitations.  No change in therapy.  SLEEP APNEA: He says he has an old machine would like to write out about getting a new one.  I have referred him back to his previous sleep doctor.  BRADYCARDIA:   He had normal pacemaker function and I looked at the November reading.  He is a paced 64% of the time and V paced 21% of the time.  There is less than 1% mode switching.  COVID EDUCATION:   We talked about him getting the booster vaccine for [employer].  Current medicines are reviewed at length with the patient today.  The patient does not have concerns regarding medicines.  The following changes have been made:     Labs/ tests ordered today include:    Orders Placed This Encounter  Procedures  . Ambulatory referral to Neurology     Disposition:   FU with me in 3 months.     Signed, Minus Breeding, MD  10/23/2020 2:17 PM    [facility]

## 2020-10-23 ENCOUNTER — Other Ambulatory Visit: Payer: Self-pay | Admitting: Cardiology

## 2020-10-23 ENCOUNTER — Encounter: Payer: Self-pay | Admitting: Cardiology

## 2020-10-23 ENCOUNTER — Telehealth: Payer: Self-pay

## 2020-10-23 ENCOUNTER — Other Ambulatory Visit: Payer: Self-pay | Admitting: Internal Medicine

## 2020-10-23 ENCOUNTER — Ambulatory Visit: Payer: Medicare Other | Admitting: Cardiology

## 2020-10-23 VITALS — BP 130/72 | HR 64 | Ht 70.0 in | Wt 251.0 lb

## 2020-10-23 DIAGNOSIS — I10 Essential (primary) hypertension: Secondary | ICD-10-CM | POA: Diagnosis not present

## 2020-10-23 DIAGNOSIS — R0602 Shortness of breath: Secondary | ICD-10-CM | POA: Diagnosis not present

## 2020-10-23 DIAGNOSIS — I4819 Other persistent atrial fibrillation: Secondary | ICD-10-CM | POA: Diagnosis not present

## 2020-10-23 DIAGNOSIS — I5032 Chronic diastolic (congestive) heart failure: Secondary | ICD-10-CM | POA: Diagnosis not present

## 2020-10-23 DIAGNOSIS — G4730 Sleep apnea, unspecified: Secondary | ICD-10-CM

## 2020-10-23 DIAGNOSIS — R001 Bradycardia, unspecified: Secondary | ICD-10-CM

## 2020-10-23 LAB — SEDIMENTATION RATE: Sed Rate: 21 mm/hr — ABNORMAL HIGH (ref 0–20)

## 2020-10-23 LAB — C-REACTIVE PROTEIN: CRP: <1 mg/dL (ref 0.5–20.0)

## 2020-10-23 MED ORDER — METOPROLOL SUCCINATE ER 50 MG PO TB24
50.0000 mg | ORAL_TABLET | Freq: Two times a day (BID) | ORAL | 11 refills | Status: DC
Start: 2020-10-23 — End: 2020-12-02

## 2020-10-23 MED ORDER — METOPROLOL SUCCINATE ER 50 MG PO TB24
50.0000 mg | ORAL_TABLET | Freq: Two times a day (BID) | ORAL | 11 refills | Status: DC
Start: 2020-10-23 — End: 2020-10-23

## 2020-10-23 NOTE — Telephone Encounter (Signed)
Metoprolol refilled per patient request post visit.

## 2020-10-23 NOTE — Patient Instructions (Addendum)
Medication Instructions:  Decrease Metoprolol to 50mg  twice a day *If you need a refill on your cardiac medications before your next appointment, please call your pharmacy*  Lab Work: None ordered this visit  Testing/Procedures: Referred to Dr. Brett Fairy for CPAP and Sleep Apnea  Follow-Up: At [facility], you and your health needs are our priority.  As part of our continuing mission to provide you with exceptional heart care, we have created designated Provider Care Teams.  These Care Teams include your primary Cardiologist (physician) and Advanced Practice Providers (APPs -  Physician Assistants and Nurse Practitioners) who all work together to provide you with the care you need, when you need it.  Your next appointment:   6 month(s)  You will receive a reminder letter in the mail two months in advance. If you don't receive a letter, please call our office to schedule the follow-up appointment.  The format for your next appointment:   In Person  Provider:   Minus Breeding, MD

## 2020-10-24 LAB — ANA+ENA+DNA/DS+SCL 70+SJOSSA/B
ANA Titer 1: NEGATIVE
ENA RNP Ab: <0.2 AI (ref 0.0–0.9)
ENA SM Ab Ser-aCnc: <0.2 AI (ref 0.0–0.9)
ENA SSA (RO) Ab: <0.2 AI (ref 0.0–0.9)
ENA SSB (LA) Ab: <0.2 AI (ref 0.0–0.9)
Scleroderma (Scl-70) (ENA) Antibody, IgG: <0.2 AI (ref 0.0–0.9)
dsDNA Ab: <1 IU/mL (ref 0–9)

## 2020-10-24 LAB — ANA: Anti Nuclear Antibody (ANA): NEGATIVE

## 2020-10-24 LAB — ANTI-DNA ANTIBODY, DOUBLE-STRANDED: ds DNA Ab: <1 IU/mL

## 2020-10-29 NOTE — Progress Notes (Signed)
@Patient  ID: Chad [name], male    DOB: 07/27/1929, 86 y.o.   MRN: [id number]  Chief Complaint  Patient presents with  . ILD    Pt referred by cardiologist for IPF.  HRCT in Epic.     Referring provider: Deboraha Sprang, MD  HPI:   86 year old whom we are seeing in consultation at the request of Virl Axe, MD for evaluation of pulmonary fibrosis on CT chest. Notes from referring provider reviewed. Chief complaint is DOE.  Notes worsening DOE over last several months to 1 year. Worse with inclines, stairs. Also notable on flat surfaces - for example when walking in grocery store.  Improves with rest. Denies SOB at rest. No timing when better or worse during day or night. No clear change with changes in environment, location. This prompted work up by cardiologist. TTE reassuring. Stress test reassuring. CT chest was obtained that demonstrated on my review and intepretation mild fibrotic changes in bilateral dependent bases and other similar mild changes in anterior RUL. This prompted referral.  He notes during pandemic he is much less active. Used to regularly go out, shop, walk around outside. Now his activity is pretty minimal. Does not leave house often. Notes significant GI issues, diarrhea. Issues with diverticulitis. Leery of medicines that may cause GI issues.  PMH: Afib, HTN Surgical History: back surgery, pacemaker, TURBT Family History: Father with HTN, CHF, mother with dementia Social History: Retired, lives alone, 10 pack year smoking history   Questionaires / Pulmonary Flowsheets:   ACT:  No flowsheet data found.  MMRC: No flowsheet data found.  Epworth:  No flowsheet data found.  Tests:   FENO:  No results found for: NITRICOXIDE  PFT: No flowsheet data found.  WALK:  No flowsheet data found.  Imaging: No results found.  Lab Results: Personally reviewed, Eos 300 most recently.  CBC    Component Value Date/Time   WBC 7.7 01/18/2019 1352   RBC  4.85 01/18/2019 1352   HGB 14.0 01/18/2019 1352   HGB 15.5 07/25/2018 1348   HCT 44.7 01/18/2019 1352   HCT 45.2 07/25/2018 1348   PLT 212 01/18/2019 1352   PLT 194 07/25/2018 1348   MCV 92.2 01/18/2019 1352   MCV 88 07/25/2018 1348   MCH 28.9 01/18/2019 1352   MCHC 31.3 01/18/2019 1352   RDW 13.4 01/18/2019 1352   RDW 14.7 07/25/2018 1348   LYMPHSABS 0.9 01/18/2019 1352   MONOABS 0.5 01/18/2019 1352   EOSABS 0.3 01/18/2019 1352   BASOSABS 0.0 01/18/2019 1352    BMET    Component Value Date/Time   NA 140 08/04/2020 1626   NA 139 07/06/2020 1612   K 4.2 08/04/2020 1626   CL 101 08/04/2020 1626   CO2 31 08/04/2020 1626   GLUCOSE 97 08/04/2020 1626   BUN 12 08/04/2020 1626   BUN 11 07/06/2020 1612   CREATININE 1.34 (H) 08/04/2020 1626   CREATININE 1.16 (H) 06/12/2017 1635   CALCIUM 9.7 08/04/2020 1626   GFRNONAA 47 (L) 08/04/2020 1626   GFRAA 54 (L) 08/04/2020 1626    BNP    Component Value Date/Time   BNP 119.9 (H) 07/06/2020 1612   BNP 169.0 (H) 01/18/2019 1352    ProBNP No results found for: PROBNP  Specialty Problems      Pulmonary Problems   OSA on CPAP      Allergies  Allergen Reactions  . Amiodarone     Caused neuropathy  . Carbapenems   .  Cephalexin Other (See Comments)  . Cephalosporins Other (See Comments)  . Chlorpheniramine Other (See Comments)    Swells prostate Swells prostate  . Ciprofloxacin     Pt can not take with amiodarone   . Decongestant [Oxymetazoline]     All decongestant - makes prostate swell  . Diltiazem Swelling    Left leg swelling  . Flagyl [Metronidazole] Nausea Only    Stomach ache  . Hctz [Hydrochlorothiazide]   . Levaquin [Levofloxacin In D5w] Other (See Comments)    unknown  . Levofloxacin Nausea Only  . Lidocaine     Pt unsure of  . Penicillins Swelling    Has patient had a PCN reaction causing immediate rash, facial/tongue/throat swelling, SOB or lightheadedness with hypotension: No Has patient had a PCN  reaction causing severe rash involving mucus membranes or skin necrosis: No Has patient had a PCN reaction that required hospitalization: No Has patient had a PCN reaction occurring within the last 10 years: No If all of the above answers are "NO", then may proceed with Cephalosporin use. Swelling at injection site  . Teline [Tetracycline] Swelling    Hospitalization for 2 weeks, prostate swelling  . Tetanus Toxoid Swelling    Swells arms, Please pre-medicate with benadryl.  Marland Kitchen Z-Pak [Azithromycin] Other (See Comments)    Pt can not take with Amiodarone   . Sulfonamide Derivatives Rash  . Toprol Xl [Metoprolol Tartrate] Itching and Rash    Pt states he tolerates.    Immunization History  Administered Date(s) Administered  . Fluad Quad(high Dose 65+) 10/22/2020    Past Medical History:  Diagnosis Date  . Bladder cancer ([place]) 2010  . Chronic bronchitis ([place])   . Hypertension   . Hypoglycemia   . On home oxygen therapy    "2L w/CPAP at night" (06/21/2017)  . OSA on CPAP    "w/2L O2 at night" (06/21/2017)  . Persistent atrial fibrillation ([place])       . Pneumonia    "when he was young; again in 2016" (06/21/2017)  . Presence of permanent cardiac pacemaker   . Sensory ataxia     Tobacco History: Social History   Tobacco Use  Smoking Status Former Smoker  . Packs/day: 0.50  . Years: 20.00  . Pack years: 10.00  . Types: Cigarettes  . Quit date: 06/03/1971  . Years since quitting: 49.4  Smokeless Tobacco Never Used   Counseling given: Not Answered   Continue to not smoke  Outpatient Encounter Medications as of 10/22/2020  Medication Sig  . Cholecalciferol (VITAMIN D3) 2000 units capsule Take 2,000 Units by mouth 2 (two) times daily.  . ciclopirox (PENLAC) 8 % solution Apply topically at bedtime. Apply over nail and surrounding skin. Apply daily over previous coat. After seven (7) days, may remove with alcohol and continue cycle.  . Coenzyme Q10 100 MG capsule Take 100 mg by  mouth daily.  Marland Kitchen escitalopram (LEXAPRO) 10 MG tablet Take 10 mg by mouth at bedtime.  . furosemide (LASIX) 20 MG tablet Take 40 mg by mouth daily.   . irbesartan (AVAPRO) 300 MG tablet Take 0.5 tablets (150 mg total) by mouth daily.  . Multiple Vitamins-Minerals (CENTRUM SILVER PO) Take 1 tablet by mouth daily.  . OXYGEN Inhale 2 L into the lungs at bedtime. Uses with CPAP machine  . triamcinolone cream (KENALOG) 0.1 % as needed.   . vitamin B-12 (CYANOCOBALAMIN) 500 MCG tablet Take 500 mcg by mouth 2 (two) times daily.  Marland Kitchen warfarin (  COUMADIN) 5 MG tablet TAKE 1/2 TO 1 (ONE-HALF TO ONE) TABLET BY MOUTH ONCE DAILY AS DIRECTED BY COUMADIN CLINIC  . [DISCONTINUED] dofetilide (TIKOSYN) 250 MCG capsule Take 1 capsule by mouth twice daily  . [DISCONTINUED] metoprolol succinate (TOPROL-XL) 50 MG 24 hr tablet TAKE 2 TABLETS BY MOUTH ONCE DAILY IN THE MORNING AND 1 TABLET ONCE DAILY IN THE EVENING (Patient taking differently: 50 mg in the morning and at bedtime. 2 tabs am. (100mg )  2 tabs pm)   No facility-administered encounter medications on file as of 10/22/2020.     Review of Systems  Review of Systems  No orthopnea or PND. No chest pain with exertion. Comprehensive review of systems otherwise negative.   Physical Exam  BP 124/66 (BP Location: Left Arm, Cuff Size: Normal)   Pulse (!) 56   Ht 5\' 10"  (1.778 m)   Wt 248 lb 3.2 oz (112.6 kg)   SpO2 96%   BMI 35.61 kg/m   Wt Readings from Last 5 Encounters:  10/23/20 251 lb (113.9 kg)  10/22/20 248 lb 3.2 oz (112.6 kg)  09/22/20 242 lb (109.8 kg)  09/08/20 242 lb (109.8 kg)  08/04/20 242 lb 6.4 oz (110 kg)    BMI Readings from Last 5 Encounters:  10/23/20 36.01 kg/m  10/22/20 35.61 kg/m  09/22/20 35.74 kg/m  09/08/20 35.74 kg/m  08/04/20 35.80 kg/m     Physical Exam General: elderly, well developed Eyes: EOMI, no icterus Neck: Supple, no JVP appreciated Pulmonary: Mild early inspiratory crackles in bases, no wheeze CV:  RRR, no murmurs Abdomen: soft, ND MSK: no synovitis, no joint effusions Neuro: Normal gait, no weakness Psych: normal mood, full affect   Assessment & Plan:   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 A contributor to his DOE. Long discussion of role of anti-fibrotics. Frankly given age suspect little benefit and with already significant GI issues the side effects likely outweigh the benefits. He agreed. Plan to obtain PFTs. Repeat CT in 6 months. Serologic work up for CTD/CT related disease. If symptoms or imaging progresses, can re-address role of therapy.  DOE: Likely multifactorial, suspect contribution of OSA, likely OHS (elevated bicarb on BMP), deconditioning. Consider asthma given mild eos on CBC. PFTs as above.   Return in about 3 months (around 01/20/2021).   Lanier Clam, MD 10/29/2020

## 2020-11-11 ENCOUNTER — Ambulatory Visit: Payer: Medicare Other | Admitting: Pharmacist

## 2020-11-11 ENCOUNTER — Other Ambulatory Visit: Payer: Self-pay

## 2020-11-11 DIAGNOSIS — I4819 Other persistent atrial fibrillation: Secondary | ICD-10-CM

## 2020-11-11 DIAGNOSIS — Z5181 Encounter for therapeutic drug level monitoring: Secondary | ICD-10-CM | POA: Diagnosis not present

## 2020-11-11 LAB — POCT INR: INR: 2.5 (ref 2.0–3.0)

## 2020-11-11 NOTE — Patient Instructions (Signed)
Description   Using CBD oil for pain Continue warfarin 1/2 tablet daily  Recheck INR in 6 weeks

## 2020-11-23 ENCOUNTER — Telehealth: Payer: Self-pay | Admitting: Cardiology

## 2020-11-23 NOTE — Telephone Encounter (Signed)
Patient's daughter called and wanted to let nurse know that she did everything she told her to do and thinks that the transmission went through. Please call to confirm

## 2020-11-23 NOTE — Telephone Encounter (Signed)
Patient daughter advised that they were talking to a rep for a transmission download, but got disconnected and did not know if it went through. I advised her I did not see anything in our system but maybe the rep would call back to let them know if it went through. She stated she just wanted to let Dr.Kleins office know. Will route to his RN to make aware.

## 2020-11-24 NOTE — Telephone Encounter (Signed)
Confirmed that transmission received. °

## 2020-12-01 ENCOUNTER — Institutional Professional Consult (permissible substitution): Payer: Medicare Other | Admitting: Neurology

## 2020-12-02 ENCOUNTER — Other Ambulatory Visit: Payer: Self-pay

## 2020-12-02 ENCOUNTER — Encounter: Payer: Self-pay | Admitting: Neurology

## 2020-12-02 ENCOUNTER — Ambulatory Visit: Payer: Medicare Other | Admitting: Neurology

## 2020-12-02 VITALS — BP 141/82 | HR 49 | Ht 70.0 in | Wt 246.0 lb

## 2020-12-02 DIAGNOSIS — Z7901 Long term (current) use of anticoagulants: Secondary | ICD-10-CM | POA: Diagnosis not present

## 2020-12-02 DIAGNOSIS — I4819 Other persistent atrial fibrillation: Secondary | ICD-10-CM | POA: Diagnosis not present

## 2020-12-02 DIAGNOSIS — G4719 Other hypersomnia: Secondary | ICD-10-CM

## 2020-12-02 DIAGNOSIS — G4734 Idiopathic sleep related nonobstructive alveolar hypoventilation: Secondary | ICD-10-CM | POA: Diagnosis not present

## 2020-12-02 DIAGNOSIS — J684 Chronic respiratory conditions due to chemicals, gases, fumes and vapors: Secondary | ICD-10-CM

## 2020-12-02 DIAGNOSIS — J849 Interstitial pulmonary disease, unspecified: Secondary | ICD-10-CM | POA: Insufficient documentation

## 2020-12-02 DIAGNOSIS — G4733 Obstructive sleep apnea (adult) (pediatric): Secondary | ICD-10-CM

## 2020-12-02 NOTE — Progress Notes (Signed)
SLEEP MEDICINE CLINIC    Provider:  Larey Seat, MD  Primary Care Physician:  [facility] [employer], [place] [id number]     Referring Provider: Dr Percival Spanish, MD         Chief Complaint according to patient   Patient presents with:    . New Patient (Initial Visit)           HISTORY OF PRESENT ILLNESS:  Chad [name] is a 87 year old  Caucasian male patient and is seen here upon a referral from Dr. Percival Spanish on  12/02/2020 .  Chief concern according to patient :  the patient reports increasing shortness of breath, sudden onset in 06-2020. Patient felt too tired on high beta blockers, is on Tikosyn and on anticoagulation on Warfarin. He was diagnosed with pulmonary fibrosis by Dr Caryl Comes in 09-2020. He has a pacemaker. He is in chronic pain. Pain managing deferred as he is anticoagulated.  He uses 2 liters oxygen.    I have the pleasure of seeing Chad [name] today, a right-handed Caucasian male with a known sleep disorder.  He  has a past medical history of Bladder cancer ([place]) (2010), Chronic bronchitis ([place]), Hypertension, Hypoglycemia, On home oxygen therapy, on OSA on CPAP, Persistent atrial fibrillation ([place]), Pneumonia, Presence of permanent cardiac pacemaker, and DDD and spinal degeneration, chronic pain, and ( NS Dr. Quentin Cornwall )  ataxia..  The patient had the first sleep study in the year 2014 at [place] [facility]- with a result of an AHI ( Apnea Hypopnea index)  Of 63.8/h , and low oxygen saturation for 20 minutes  Nadir at SP02 80%. Many PLMs attributed to spinal stenosis. Atrial fib.      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  Worked night shifts all his career.   Family status is widowed with adult children, was married 41 years. Wife died Sep 06, 2019. He had 6 sisters and still has 6 siblings living, older brother and older sister. One sister died of  COVID.  Tobacco use- history of smoking, quit 46 year ago.  ETOH use; 3 times a week,  Caffeine intake in form of Coffee( /) Soda( /) Tea ( /) some dark chocolate. Regular exercise in form of walking , limited.   Hobbies : 1991, Special educational needs teacher, genealogy.       Sleep habits are as follows:  The patient's dinner time is between 6-7  PM. Snacks at 11 PM.  The patient goes to bed at 2-3 AM and continues to sleep for many hours,often 3-4  hours, wakes for one-ywo bathroom breaks.   The preferred sleep position is supine due to CPAP- and O2.  , with the support of 1-2 pillows. Dreams are reportedly very frequent/vivid.  11-noon AM is the usual rise time. The patient wakes up spontaneously.  He  reports not feeling refreshed or restored in AM, with symptoms such as dry mouth, and stiffness, residual fatigue.  Naps are taken infrequently, but he dozes off- on his couch . These are power naps.   Review of Systems: Out of a complete 14 system review, the patient complains of only the following symptoms, and all other reviewed systems are negative.:  Fatigue, sleepiness , snoring, fragmented sleep, sleeps on CPAP , but snores through.PLMs and Pain from spinal stenosis.   How likely are you to doze in the following  situations: 0 = not likely, 1 = slight chance, 2 = moderate chance, 3 = high chance   Sitting and Reading? Watching Television? Sitting inactive in a public place (theater or meeting)? As a passenger in a car for an hour without a break? Lying down in the afternoon when circumstances permit? Sitting and talking to someone? Sitting quietly after lunch without alcohol? In a car, while stopped for a few minutes in traffic?   Total = 12/ 24 points   FSS endorsed at 49/ 63 points.   Social History   Socioeconomic History  . Marital status: Married    Spouse name: [name]  . Number of children: 2  . Years of education: 50  . Highest education level: Not on file  Occupational History     Employer: RETIRED  Tobacco Use  . Smoking status: Former Smoker    Packs/day: 0.50    Years: 20.00    Pack years: 10.00    Types: Cigarettes    Quit date: 06/03/1971    Years since quitting: 49.5  . Smokeless tobacco: Never Used  Vaping Use  . Vaping Use: Never used  Substance and Sexual Activity  . Alcohol use: No  . Drug use: No  . Sexual activity: Not on file  Other Topics Concern  . Not on file  Social History Narrative   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   Patient has two children.   Patient is retired.   Patient has a college education.   Patient is right-handed.   Patient does not drink any caffeine.   Social Determinants of Health   Financial Resource Strain: Not on file  Food Insecurity: Not on file  Transportation Needs: Not on file  Physical Activity: Not on file  Stress: Not on file  Social Connections: Not on file    Family History  Problem Relation Age of Onset  . Heart failure Father   . Hypertension Father   . Dementia Mother     Past Medical History:  Diagnosis Date  . Bladder cancer ([place]) 2010  . Chronic bronchitis ([place])   . Hypertension   . Hypoglycemia   . On home oxygen therapy    "2L w/CPAP at night" (06/21/2017)  . OSA on CPAP    "w/2L O2 at night" (06/21/2017)  . Persistent atrial fibrillation ([place])       . Pneumonia    "when he was young; again in 2016" (06/21/2017)  . Presence of permanent cardiac pacemaker   . Sensory ataxia     Past Surgical History:  Procedure Laterality Date  . BACK SURGERY    . CARDIOVERSION N/A 04/23/2015   Procedure: CARDIOVERSION;  Surgeon: [name], MD;  Location: [place];  Service: Cardiovascular;  Laterality: N/A;  . CARDIOVERSION N/A 07/30/2015   Procedure: CARDIOVERSION;  Surgeon: [name], MD;  Location: [facility] ENDOSCOPY;  Service: Cardiovascular;  Laterality: N/A;  . EYE SURGERY Bilateral 2009   "to lower pressure; no glaucoma" (06/21/2017)  . INSERT / REPLACE /  REMOVE PACEMAKER  06/21/2017  . [place]  . PACEMAKER IMPLANT N/A 06/21/2017   Procedure: Pacemaker Implant;  Surgeon: Deboraha Sprang, MD;  Location: [facility];  Service: Cardiovascular;  Laterality: N/A;  . TRANSURETHRAL RESECTION OF BLADDER TUMOR WITH GYRUS (TURBT-GYRUS)  07/10/2009    Cystourethroscopy, gyrus TURBT (transurethral  resection of bladder tumor).[name] 03/16/2011     Current Outpatient Medications on File  Prior to Visit  Medication Sig Dispense Refill  . ascorbic acid (VITAMIN C) 1000 MG tablet Take 1,000 mg by mouth daily.    . Cholecalciferol (VITAMIN D3) 2000 units capsule Take 2,000 Units by mouth 2 (two) times daily.    . Coenzyme Q10 200 MG TABS Take 200 mg by mouth daily.    Marland Kitchen dofetilide (TIKOSYN) 250 MCG capsule Take 1 capsule by mouth twice daily 180 capsule 2  . escitalopram (LEXAPRO) 10 MG tablet Take 10 mg by mouth at bedtime.    . furosemide (LASIX) 20 MG tablet Take 40 mg by mouth daily.     . irbesartan (AVAPRO) 75 MG tablet Take 75 mg by mouth daily.    . metoprolol tartrate (LOPRESSOR) 100 MG tablet Take 100 mg by mouth 2 (two) times daily.    . Multiple Vitamins-Minerals (CENTRUM SILVER PO) Take 1 tablet by mouth daily.    . OXYGEN Inhale 2 L into the lungs at bedtime. Uses with CPAP machine    . vitamin B-12 (CYANOCOBALAMIN) 500 MCG tablet Take 500 mcg by mouth 2 (two) times daily.    Marland Kitchen warfarin (COUMADIN) 5 MG tablet TAKE 1/2 TO 1 (ONE-HALF TO ONE) TABLET BY MOUTH ONCE DAILY AS DIRECTED BY COUMADIN CLINIC 90 tablet 1  . ZINC GLUCONATE PO Take 1 tablet by mouth daily.     No current facility-administered medications on file prior to visit.    Allergies  Allergen Reactions  . Amiodarone     Caused neuropathy  . Carbapenems   . Cephalexin Other (See Comments)  . Cephalosporins Other (See Comments)  . Chlorpheniramine Other (See Comments)    Swells prostate Swells prostate  . Ciprofloxacin     Pt can not take with amiodarone    . Decongestant [Oxymetazoline]     All decongestant - makes prostate swell  . Diltiazem Swelling    Left leg swelling  . Flagyl [Metronidazole] Nausea Only    Stomach ache  . Hctz [Hydrochlorothiazide]   . Levaquin [Levofloxacin In D5w] Other (See Comments)    unknown  . Levofloxacin Nausea Only  . Lidocaine     Pt unsure of  . Penicillins Swelling    Has patient had a PCN reaction causing immediate rash, facial/tongue/throat swelling, SOB or lightheadedness with hypotension: No Has patient had a PCN reaction causing severe rash involving mucus membranes or skin necrosis: No Has patient had a PCN reaction that required hospitalization: No Has patient had a PCN reaction occurring within the last 10 years: No If all of the above answers are "NO", then may proceed with Cephalosporin use. Swelling at injection site  . Teline [Tetracycline] Swelling    Hospitalization for 2 weeks, prostate swelling  . Tetanus Toxoid Swelling    Swells arms, Please pre-medicate with benadryl.  Marland Kitchen Z-Pak [Azithromycin] Other (See Comments)    Pt can not take with Amiodarone   . Sulfonamide Derivatives Rash  . Toprol Xl [Metoprolol Tartrate] Itching and Rash    Pt states he tolerates.    Physical exam:  Today's Vitals   12/02/20 1310  BP: (!) 141/82  Pulse: (!) 49  Weight: 246 lb (111.6 kg)  Height: 5\' 10"  (1.778 m)   Body mass index is 35.3 kg/m.   Wt Readings from Last 3 Encounters:  12/02/20 246 lb (111.6 kg)  10/23/20 251 lb (113.9 kg)  10/22/20 248 lb 3.2 oz (112.6 kg)     Ht Readings from Last 3 Encounters:  12/02/20 5'  10" (1.778 m)  10/23/20 5\' 10"  (1.778 m)  10/22/20 5\' 10"  (1.778 m)      General: The patient is awake, alert and appears not in acute distress. The patient is well groomed. Head: Normocephalic, atraumatic. Neck is supple. Mallampati 1 - wide open but dark red. ,  neck circumference 17.5  inches . Nasal airflow  patent.  Retrognathia is not seen.  Dental status:  dentures.  Cardiovascular:  Regular rate and cardiac rhythm by pulse,  without distended neck veins. Respiratory: Lungs are clear to auscultation.  Skin:  Without evidence of ankle edema, or rash. Trunk: The patient's posture is erect.   Neurologic exam : The patient is awake and alert, oriented to place and time.   Memory subjective described as intact.  Attention span & concentration ability appears normal.  Speech is fluent,  without  dysarthria, dysphonia or aphasia.  Mood and affect are appropriate.   Cranial nerves: no loss of smell or taste reported  Pupils are equal and briskly reactive to light. Funduscopic exam deferred.  Extraocular movements in vertical and horizontal planes were intact and without nystagmus. No Diplopia. Visual fields by finger perimetry are intact. Hearing was intact to soft voice and finger rubbing. Facial sensation intact to fine touch.  Facial motor strength is symmetric and tongue and uvula move midline.  Neck ROM : rotation, tilt and flexion extension were normal for age and shoulder shrug was symmetrical.    Motor exam:  Symmetric bulk, tone and ROM.  hip pain and lower back.  Normal tone without cogwheeling, symmetric grip strength . Sensory:  Fine touch, pinprick and vibration were tested  and  normal.  Proprioception tested in the upper extremities was normal. Coordination: Rapid alternating movements in the fingers/hands were of normal speed.  The Finger-to-nose maneuver was intact without evidence of ataxia, dysmetria or tremor.  Gait and station: Patient could not rise unassisted from a seated position, he walked with a cane as  assistive device.  Deep tendon reflexes: in the  upper and lower extremities are symmetrically attenuated, trace only.      After spending a total time of 60  minutes face to face and additional time for physical and neurologic examination, review of laboratory studies,  personal review of imaging studies, reports  and results of other testing and review of referral information / records as far as provided in visit, I have established the following assessments:  1)  He has a history of spinal stenosis, amiodarone neuropathy.  2)  He has atrial fibrillation- on anticoagulation, on CPAP and oxygen, there is newly diagnosed pulmonary fibrosis. Oxygen dependent.    My Plan is to proceed with:  1) we need an attended sleep study to re-titrate to PAP therapy- may need CPAP or BiPAP- and we need to titrate to oxygen if we are to maintain the prescription for oxygen.  She is Chad [name] is still using the original prescribed CPAP from the year 2014.  That means he is not due for a replacement.  He has been 100% compliant user with an average use at time of 7 hours 7 minutes at night set pressure of 12 cmH2O with 1 cm expiratory pressure relief his air sense 10 CPAP has a serial #[id number] his residual apneas currently are high at 13/h he does have a lot of air leakage which may contribute to those residual apneas it is also possible that some of his apneas are not obstructive and central  in origin and the machine cannot differentiate that.  This is another reason why we need to have an in lab sleep study was a new titration. 2) he feels always better when he uses his CPAP, noted a difference when the power was out.    I would like to thank Dr Percival Spanish, MD ,for allowing me to meet with and to take care of this pleasant patient.   In short, Chad [name] is presenting with a need for continued PAP therapy, but may have a different need for Oxygen and for BiPAP and possible ASV.  I plan to follow up either personally or through our NP within 2-3 month.   CC: I will share my notes with PCP.   Electronically signed by: Larey Seat, MD 12/02/2020 1:14 PM  [facility] and [facility] Board certified by The [employer] of Sleep Medicine and Diplomate of the [employer] of Sleep  Medicine. Board certified In Neurology through the [place], Fellow of the [employer] of Neurology. Medical Director of [facility].

## 2020-12-02 NOTE — Patient Instructions (Signed)
171, P1-P2. Online version updated July 2020.Retrieved from https://[email or web address].org/patients/patient-resources/resources/oxygen-therapy.pdf">  Hypoxemia  Hypoxemia happens when the blood does not have enough oxygen in it. Every part of the body needs oxygen to work well. Oxygen enters the lungs when a person breathes in and then it travels to all parts of the body through the blood. Hypoxemia can develop suddenly or slowly and can be mild to severe. What are the causes? Causes of hypoxemia may include:  Lung conditions. These may include: ? Long-term (chronic) lung disease, such as:  Asthma.  Chronic obstructive pulmonary disease (COPD).  Interstitial lung disease. ? Problems that affect breathing at night, such as sleep apnea. ? Fluid buildup in the lungs. ? Lung infection (pneumonia). ? Lung or throat cancer. ? A collapsed lung.  Heart or blood vessel (vascular) conditions, such as: ? A blood clot in the lungs (pulmonary embolus). ? Certain types of heart disease.  Other causes may include: ? Certain diseases that affect nerves or muscles. ? Slow or shallow breathing due to being very overweight (obesity hypoventilation). ? High altitudes, as there is less oxygen in the air. ? Toxic chemicals, smoke, and gases. What are the signs or symptoms? In some cases, there may be no symptoms of this condition. If you do have symptoms, they may include:  Shortness of breath.  Breathing that is fast, noisy, or shallow.  Bluish color of the skin, lips, or nail beds.  A fast heartbeat.  Feeling tired or sleepy.  Feeling confused or agitated. If hypoxemia develops quickly, it is likely you will suddenly have trouble breathing. If hypoxemia develops slowly over months or years, you may not notice any symptoms. How is this diagnosed? This condition is diagnosed by:  A physical exam.  A blood test that measures the amount of oxygen in your blood.  A test that measures the  percentage of oxygen in your blood (pulse oximetry). This is done by placing a sensor on your finger, toe, or earlobe. How is this treated? Treatment for this condition depends on the cause or the severity of your hypoxemia.  You will likely be treated with oxygen therapy to restore your blood oxygen level.  You may need oxygen therapy for a short time, such as weeks or months, or you may need it for the rest of your life.  You may be asked to lay on your stomach (prone). This may help bring your oxygen level up or help you feel less short of breath. Your health care provider may also recommend other therapies to treat the underlying cause of your hypoxemia. Follow these instructions at home:  Take over-the-counter and prescription medicines only as told by your health care provider.  If you are on oxygen therapy, follow oxygen safety precautions as directed by your health care provider. Precautions may include: ? Always have a backup supply of oxygen. ? Do not let anyone smoke or have a fire near your oxygen supply. ? Handle oxygen tanks carefully as told by your health care provider.  Do not use any products that contain nicotine or tobacco, such as cigarettes, e-cigarettes, and chewing tobacco. If you need help quitting, ask your health care provider. Stay away from people who smoke.  Keep all follow-up visits as told by your health care provider. This is important.   Contact a health care provider if:  You have any concerns about your oxygen therapy.  You have trouble breathing, even while wearing an oxygen supply.  You become short of breath   when you exercise.  You are still tired or have a headache when you wake up. Get help right away if:  Your shortness of breath gets worse, especially with normal activity or only a little bit of activity.  Your skin, lips, or nail beds are a bluish color.  You become confused or you cannot think properly.  You have chest pain.  You  have a fever. These symptoms may represent a serious problem that is an emergency. Do not wait to see if the symptoms will go away. Get medical help right away. Call your local emergency services (911 in the [place] Do not drive yourself to the hospital. Summary  Hypoxemia occurs when the blood does not contain enough oxygen.  Hypoxemia may or may not cause symptoms. Often, the main symptom is shortness of breath..  Depending on the cause of your hypoxemia, you may need oxygen therapy for a short time, such as weeks or months, or you may need it for the rest of your life.  If you are on oxygen therapy, follow oxygen safety precautions as directed by your health care provider. This information is not intended to replace advice given to you by your health care provider. Make sure you discuss any questions you have with your health care provider. Document Revised: 11/21/2019 Document Reviewed: 11/21/2019 Elsevier Patient Education  2021 Elsevier Inc.  

## 2020-12-07 ENCOUNTER — Telehealth: Payer: Self-pay | Admitting: Cardiology

## 2020-12-07 NOTE — Telephone Encounter (Signed)
Patient's daughter has been made aware and verbalized her understanding. She will call PCP if the cough is not better.

## 2020-12-07 NOTE — Telephone Encounter (Signed)
Patient can take Robitussin for 3-4 days or zyrtec if cough related to allergies  Avoid other antihistaminic, benadryl, Zyrtec D, or Mucinex D, etc.  PCP can assess cough and prescribe medication as needed

## 2020-12-07 NOTE — Telephone Encounter (Signed)
Patient's daughter, Curt Bears, calling on behalf of patient. She states patient started coughing yesterday and coughed all night sending him arrhythmia. She says after 2 hours he went back into a normal rhythm. She is wanting to know if there is anything we could suggest for patient to take for the cough as he can't have any decongestants due to allergies (she also states the allergen list is up to date with [place].) He is currently taking sugar free lozenges along with his other daily medications and that is all.   Please call/advise   Thank you!

## 2020-12-07 NOTE — Telephone Encounter (Signed)
Returned the call to the patient's daughter, per the DPR. The patient has developed a cough but no other symptoms. She has been made aware to keep his PCP updated as well.  She wants to know if there is any cough medicine that the patient may take that is not contraindicated with his current medications and afib.

## 2020-12-10 ENCOUNTER — Telehealth: Payer: Self-pay

## 2020-12-10 NOTE — Telephone Encounter (Signed)
[facility] alert received for HVR, appear AVNRT.  Pt does not appear to have history of this.  Attempted to reach pt to determine if symptomatic.  Although anticipate that pt was sleeping at time of event since it was 0330 in the morning.    No answer, lvm for pt daughter requesting callback to DC.

## 2020-12-15 ENCOUNTER — Encounter: Payer: Medicare Other | Admitting: Physician Assistant

## 2020-12-17 NOTE — Telephone Encounter (Signed)
LMOM to call office to assess for s/sx for 12/06/20 episode.

## 2020-12-18 DIAGNOSIS — J209 Acute bronchitis, unspecified: Secondary | ICD-10-CM | POA: Diagnosis not present

## 2020-12-18 DIAGNOSIS — I10 Essential (primary) hypertension: Secondary | ICD-10-CM | POA: Diagnosis not present

## 2020-12-18 DIAGNOSIS — J8410 Pulmonary fibrosis, unspecified: Secondary | ICD-10-CM | POA: Diagnosis not present

## 2020-12-18 DIAGNOSIS — Z6837 Body mass index (BMI) 37.0-37.9, adult: Secondary | ICD-10-CM | POA: Diagnosis not present

## 2020-12-18 NOTE — Telephone Encounter (Addendum)
Patient had coughing episode on 12/06/20 and 12/11/20 that may correlate with episode. Patient has had a cough x 2 weeks. He has not been sen by PCP for cough. Recommended follow-up with PCP for cough. He uses CPAP with 2% O2 at night and is scheduled for a f/u sleep study because family was told O2 may need to be increased at night on CPAP. Had episode of SOB 12/17/20 around 1900 that took a few minutes to resolve walking to restaurant seat. He reports no CP, chest tightness ,dizziness or syncope at time of SOB. Transmission on 12/18/20 showed no episodes recorded around the time of the event.

## 2020-12-18 NOTE — Telephone Encounter (Signed)
Pt daughter returning nurse phone call. She asking the nurse to return the call at [phone] or [phone].

## 2020-12-21 ENCOUNTER — Ambulatory Visit (INDEPENDENT_AMBULATORY_CARE_PROVIDER_SITE_OTHER): Payer: Medicare Other | Admitting: Neurology

## 2020-12-21 ENCOUNTER — Other Ambulatory Visit: Payer: Self-pay

## 2020-12-21 DIAGNOSIS — Z7189 Other specified counseling: Secondary | ICD-10-CM

## 2020-12-21 DIAGNOSIS — G4731 Primary central sleep apnea: Secondary | ICD-10-CM

## 2020-12-21 DIAGNOSIS — G4737 Central sleep apnea in conditions classified elsewhere: Secondary | ICD-10-CM

## 2020-12-21 DIAGNOSIS — I4819 Other persistent atrial fibrillation: Secondary | ICD-10-CM

## 2020-12-21 DIAGNOSIS — Z7901 Long term (current) use of anticoagulants: Secondary | ICD-10-CM

## 2020-12-21 DIAGNOSIS — G4733 Obstructive sleep apnea (adult) (pediatric): Secondary | ICD-10-CM

## 2020-12-21 DIAGNOSIS — J684 Chronic respiratory conditions due to chemicals, gases, fumes and vapors: Secondary | ICD-10-CM

## 2020-12-21 DIAGNOSIS — G4719 Other hypersomnia: Secondary | ICD-10-CM

## 2020-12-22 NOTE — Telephone Encounter (Signed)
AMy lets look at this   I can see why you thought it was AVNRT but it is not  :(  atrial tach We should follw and he needs to see his PCP about cough

## 2020-12-24 ENCOUNTER — Ambulatory Visit (INDEPENDENT_AMBULATORY_CARE_PROVIDER_SITE_OTHER): Payer: Medicare Other | Admitting: *Deleted

## 2020-12-24 DIAGNOSIS — Z5181 Encounter for therapeutic drug level monitoring: Secondary | ICD-10-CM

## 2020-12-24 DIAGNOSIS — I4819 Other persistent atrial fibrillation: Secondary | ICD-10-CM

## 2020-12-24 LAB — POCT INR: INR: 1.8 — AB (ref 2.0–3.0)

## 2020-12-24 NOTE — Patient Instructions (Signed)
Using CBD oil for pain Take warfarin 1 tablet tonight then resume 1/2 tablet daily  Recheck INR in 3 weeks

## 2020-12-29 ENCOUNTER — Ambulatory Visit (INDEPENDENT_AMBULATORY_CARE_PROVIDER_SITE_OTHER): Payer: Medicare Other

## 2020-12-29 DIAGNOSIS — I495 Sick sinus syndrome: Secondary | ICD-10-CM | POA: Diagnosis not present

## 2020-12-30 LAB — CUP PACEART REMOTE DEVICE CHECK
Battery Remaining Longevity: 112 mo
Battery Remaining Percentage: 95.5 %
Battery Voltage: 2.99 V
Brady Statistic AP VP Percent: 17 %
Brady Statistic AP VS Percent: 41 %
Brady Statistic AS VP Percent: 6.7 %
Brady Statistic AS VS Percent: 31 %
Brady Statistic RA Percent Paced: 52 %
Brady Statistic RV Percent Paced: 24 %
Date Time Interrogation Session: 20220216073147
Implantable Lead Implant Date: 20180808
Implantable Lead Implant Date: 20180808
Implantable Lead Location: 753859
Implantable Lead Location: 753860
Implantable Lead Model: 5076
Implantable Lead Model: 5076
Implantable Pulse Generator Implant Date: 20180808
Lead Channel Impedance Value: 450 Ohm
Lead Channel Impedance Value: 480 Ohm
Lead Channel Pacing Threshold Amplitude: 0.5 V
Lead Channel Pacing Threshold Amplitude: 0.75 V
Lead Channel Pacing Threshold Pulse Width: 0.5 ms
Lead Channel Pacing Threshold Pulse Width: 0.5 ms
Lead Channel Sensing Intrinsic Amplitude: 12 mV
Lead Channel Sensing Intrinsic Amplitude: 3.3 mV
Lead Channel Setting Pacing Amplitude: 2 V
Lead Channel Setting Pacing Amplitude: 2.5 V
Lead Channel Setting Pacing Pulse Width: 0.5 ms
Lead Channel Setting Sensing Sensitivity: 2 mV
Pulse Gen Model: 2272
Pulse Gen Serial Number: 8932564

## 2020-12-31 ENCOUNTER — Telehealth: Payer: Self-pay | Admitting: Neurology

## 2020-12-31 DIAGNOSIS — G4737 Central sleep apnea in conditions classified elsewhere: Secondary | ICD-10-CM | POA: Insufficient documentation

## 2020-12-31 NOTE — Progress Notes (Signed)
EKG was highly irregular with a fib, paced rhythm. DIAGNOSIS 1. Complex and Central Sleep Apnea with persistent hypoxemia on PAP required 2 liter of oxygen supplementation for Sleep Related Hypoxemia 1. The patient responded to ASV, not CPAP, BiPAP or BiPAP ST. 2. With ASV, the final setting was 15 cm water maximum pressure support, 10 cm EPAP /4 cm water minimum pressure support- with the use of a FFM ResMed 30 Medium.  3. Clear evidence of REM sleep BD.    DISCUSSION:A follow up appointment will be scheduled in the Sleep Clinic at [facility].   Please call [phone] with any questions.

## 2020-12-31 NOTE — Procedures (Signed)
PATIENT'S NAME:  Chad [name], Chad [name] DOB:      October 03, 1929      MR#:    [id number]     DATE OF RECORDING: 12/21/2020 Oneita Jolly REFERRING M.D.:  Dr. Percival Spanish, MD Study Performed:   ASV-Titration HISTORY:  I have the pleasure of seeing GENEROSO [name] today, a 87 year-old  right-handed Caucasian male with a known sleep disorder and medical history of home oxygen therapy- OSA on CPAP, Persistent atrial fibrillation ([place]), Pneumonia, cardiac pacemaker, and DDD and spinal degeneration, chronic pain, and (NS Dr. Quentin Cornwall) and Ataxia.  The patient had the first sleep study in the year 2014 at [facility]- with a result of an AHI (Apnea Hypopnea index)  Of 63.8/h , and low oxygen saturation for 20 minutes  Nadir at SP02 80%. Many PLMs attributed to spinal stenosis. Atrial fib. 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    Chief concern:  the patient reports increasing shortness of breath, sudden onset in 06-2020. Patient felt too tired on high beta blockers, is on Tikosyn and on anticoagulation on Warfarin. He was diagnosed with pulmonary fibrosis by Dr Caryl Comes in 09-2020. He has a pacemaker. He is in chronic pain. Pain managing deferred as he is anticoagulated.  He uses 2 liters oxygen.  1) we need an attended sleep study to re-titrate to PAP therapy- may need CPAP or BiPAP- and we need to titrate to oxygen if we are to maintain the prescription for oxygen.   The patient endorsed the Epworth Sleepiness Scale at 12 points.   The patient's weight 246 pounds with a height of 70 (inches), resulting in a BMI of 35.3 kg/m2. The patient's neck circumference measured 17.5 inches.  CURRENT MEDICATIONS: Coumadin, Oxygen, B-12, Zinc, Centrum Silver, Lopressor, Avapro, Lexapro, Tikosyn, CoQ10, Vit D, Vit C   PROCEDURE:  This is a multichannel digital polysomnogram utilizing the SomnoStar 11.2 system.  Electrodes and sensors were applied and monitored per AASM Specifications.   EEG, EOG, Chin and Limb EMG, were  sampled at 200 Hz.  ECG, Snore and Nasal Pressure, Thermal Airflow, Respiratory Effort, CPAP Flow and Pressure, Oximetry was sampled at 50 Hz. Digital video and audio were recorded.      CPAP was initiated at 8,10 cm water and finally 12 cm water without eliminating apnea or hypoxemia. Central apnea emerged. BiPAP was attempted at 13/9 cm water and 14/10 cm water, added ST - same result. Finally, ASV was used and successfully eliminated the AHI to 0.5/h cmH20 under 15 cm maximum and 10 cm minimum pressure support and 4 cm EEP.   Lights Out was at 22:14 and Lights On at 05:00. Total recording time (TRT) was 407 minutes, with a total sleep time (TST) of 375 minutes. The patient's sleep latency was 3.5 minutes. REM latency was 64 minutes.  The sleep efficiency was 92.1 %.    SLEEP ARCHITECTURE: WASO (Wake after sleep onset) was 28.5 minutes.  There were 44 minutes in Stage N1, 231.5 minutes Stage N2, 42.5 minutes Stage N3 and 57 minutes in Stage REM.  The percentage of Stage N1 was 11.7%, Stage N2 was 61.7%, Stage N3 was 11.3% and Stage R (REM sleep) was 15.2%.  The arousals were noted as: 17 were spontaneous, 19 were associated with PLMs, 26 were associated with respiratory events.  RESPIRATORY ANALYSIS:  There was a total of 84 respiratory events: 1 obstructive apneas, 55 central apneas and 0 mixed apneas with a total of 56 apneas and an apnea index (  AI) of 9. /hour. There were 28 hypopneas with a hypopnea index of 4.48/hour. The patient also had 0 respiratory event related arousals (RERAs).      The total APNEA/HYPOPNEA INDEX  (AHI) was 13.44 /hour and the total RESPIRATORY DISTURBANCE INDEX was 13.44 /hour  1 events occurred in REM sleep and 83 events in NREM. The REM AHI was 1.1 /hour versus a non-REM AHI of 15.7 /hour.  The patient spent 375 minutes of total sleep time in the supine position and 0 minutes in non-supine. The supine AHI was 13.5, versus a non-supine AHI of 0.0.  OXYGEN SATURATION &  C02:  The baseline 02 saturation was 94%, with the lowest being 87%. Time spent below 89% saturation equaled 3 minutes.  PERIODIC LIMB MOVEMENTS:  The patient had a total of 610 Periodic Limb Movements. The Periodic Limb Movement (PLM) index was 97.6 and the PLM Arousal index was 3. /hour.   Audio and video analysis did show sleep vocalization, yelling and moving in REM sleep- typical REM sleep behavior disorder.   The patient took bathroom breaks. EKG was highly irregular with a fib, paced rhythm. DIAGNOSIS 1. Complex and Central Sleep Apnea with persistent hypoxemia on PAP required 2 liter of oxygen supplementation for Sleep Related Hypoxemia 1. The patient responded to ASV, not CPAP, BiPAP or BiPAP ST. 2. With ASV, the final setting was 15 cm water maximum pressure support, 10 cm EPAP /4 cm water minimum pressure support- with the use of a FFM ResMed 30 Medium.  3. Clear evidence of REM sleep BD.    DISCUSSION:A follow up appointment will be scheduled in the Sleep Clinic at [facility].   Please call [phone] with any questions.      I certify that I have reviewed the entire raw data recording prior to the issuance of this report in accordance with the Standards of Accreditation of the American Academy of Sleep Medicine (AASM)   Larey Seat, M.D. Diplomat, Tax adviser of Psychiatry and Neurology  Diplomat, Tax adviser of Sleep Medicine Market researcher, [employer] [facility] at [employer]   [] [employer], Tax adviser of Psychiatry and Neurology  Diplomat, Tax adviser of Sleep Medicine

## 2020-12-31 NOTE — Telephone Encounter (Signed)
-----   Message from Larey Seat, MD sent at 12/31/2020  3:41 PM EST ----- EKG was highly irregular with a fib, paced rhythm. DIAGNOSIS 1. Complex and Central Sleep Apnea with persistent hypoxemia on PAP required 2 liter of oxygen supplementation for Sleep Related Hypoxemia 1. The patient responded to ASV, not CPAP, BiPAP or BiPAP ST. 2. With ASV, the final setting was 15 cm water maximum pressure support, 10 cm EPAP /4 cm water minimum pressure support- with the use of a FFM ResMed 30 Medium.  3. Clear evidence of REM sleep BD.    DISCUSSION:A follow up appointment will be scheduled in the Sleep Clinic at [facility].   Please call [phone] with any questions.

## 2020-12-31 NOTE — Addendum Note (Signed)
Addended by: Larey Seat on: 12/31/2020 03:41 PM   Modules accepted: Orders

## 2020-12-31 NOTE — Telephone Encounter (Signed)
I called Chad [name]. I advised Chad [name] that Dr. Brett Fairy reviewed their sleep study results and found that Chad [name] was best treated with ASV machine. Dr. Brett Fairy recommends that Chad [name] starts ASV machine. I reviewed PAP compliance expectations with the Chad [name]. Chad [name] is agreeable to starting a CPAP. I advised Chad [name] that an order will be sent to a DME, [place], and [place] will call the Chad [name] within about one week after they file with the Chad [name]'s insurance. [facility] will show the Chad [name] how to use the machine, fit for masks, and troubleshoot the CPAP if needed. A follow up appt was made for insurance purposes with Dr. Brett Fairy on May 25,2022 at 2:30 pm . Chad [name] verbalized understanding to arrive 15 minutes early and bring their CPAP. A letter with all of this information in it will be mailed to the Chad [name] as a reminder. I verified with the Chad [name] that the address we have on file is correct. Chad [name] verbalized understanding of results. Chad [name] had no questions at this time but was encouraged to call back if questions arise. I have sent the order to [facility] and have received confirmation that they have received the order.

## 2021-01-05 NOTE — Progress Notes (Signed)
Remote pacemaker transmission.   

## 2021-01-07 ENCOUNTER — Ambulatory Visit: Payer: Medicare Other | Admitting: Podiatry

## 2021-01-07 ENCOUNTER — Other Ambulatory Visit: Payer: Self-pay

## 2021-01-07 DIAGNOSIS — Z7901 Long term (current) use of anticoagulants: Secondary | ICD-10-CM

## 2021-01-07 DIAGNOSIS — B351 Tinea unguium: Secondary | ICD-10-CM

## 2021-01-07 DIAGNOSIS — M79675 Pain in left toe(s): Secondary | ICD-10-CM | POA: Diagnosis not present

## 2021-01-07 DIAGNOSIS — M79674 Pain in right toe(s): Secondary | ICD-10-CM

## 2021-01-10 NOTE — Progress Notes (Signed)
Subjective: 87 y.o. returns the office today for painful, elongated, thickened toenails which they cannot trim themself. Denies any redness or drainage around the nails.  Procedure left big toenail still thickened discolored.  Needed topical medication with minimal improvement.  He has been using the topical antifungal. Denies any systemic complaints such as fevers, chills, nausea, vomiting.   He is on Coumadin  PCP: [name], MD   Objective: AAO 3, NAD DP/PT pulses palpable, CRT less than 3 seconds Nails hypertrophic, dystrophic, elongated, brittle, discolored 10.  Left hallux toenail appears to be the most affected there is tenderness overlying the nails 1-5 bilaterally. There is no surrounding erythema or drainage along the nail sites. No open lesions or pre-ulcerative lesions are identified. No pain with calf compression, swelling, warmth, erythema.  Assessment: Patient presents with symptomatic onychomycosis; chronic anticoagulation  Plan: -Treatment options including alternatives, risks, complications were discussed -Nails sharply debrided 10 without complication/bleeding. -Continue topical medication however I discussed success rates of this and there is a chance that will be no improvement of the nail. -Discussed daily foot inspection. If there are any changes, to call the office immediately.  -Follow-up in 3 months or sooner if any problems are to arise. In the meantime, encouraged to call the office with any questions, concerns, changes symptoms.  Celesta Gentile, DPM

## 2021-01-14 ENCOUNTER — Ambulatory Visit (INDEPENDENT_AMBULATORY_CARE_PROVIDER_SITE_OTHER): Payer: Medicare Other | Admitting: *Deleted

## 2021-01-14 DIAGNOSIS — I4819 Other persistent atrial fibrillation: Secondary | ICD-10-CM | POA: Diagnosis not present

## 2021-01-14 DIAGNOSIS — Z5181 Encounter for therapeutic drug level monitoring: Secondary | ICD-10-CM

## 2021-01-14 LAB — POCT INR: INR: 1.8 — AB (ref 2.0–3.0)

## 2021-01-14 NOTE — Patient Instructions (Signed)
Using CBD oil for pain Increase warfarin to 1/2 tablet daily except 1 tablet on Thursdays Recheck INR in 3 weeks

## 2021-01-25 DIAGNOSIS — G4731 Primary central sleep apnea: Secondary | ICD-10-CM | POA: Diagnosis not present

## 2021-01-25 DIAGNOSIS — G4733 Obstructive sleep apnea (adult) (pediatric): Secondary | ICD-10-CM | POA: Diagnosis not present

## 2021-01-28 ENCOUNTER — Ambulatory Visit: Payer: Medicare Other | Admitting: Neurology

## 2021-02-04 ENCOUNTER — Ambulatory Visit (INDEPENDENT_AMBULATORY_CARE_PROVIDER_SITE_OTHER): Payer: Medicare Other | Admitting: Pharmacist

## 2021-02-04 DIAGNOSIS — Z5181 Encounter for therapeutic drug level monitoring: Secondary | ICD-10-CM

## 2021-02-04 DIAGNOSIS — I4819 Other persistent atrial fibrillation: Secondary | ICD-10-CM

## 2021-02-04 LAB — POCT INR: INR: 2.2 (ref 2.0–3.0)

## 2021-02-04 NOTE — Patient Instructions (Signed)
Description   Using CBD oil for pain Continue warfarin 1/2 tablet daily except 1 tablet on Thursdays Recheck INR in 4 weeks

## 2021-02-17 IMAGING — DX DG CHEST 2V
2 series · 2 of 2 positions shown · non-contrast
Comparison: 07/08/2020

CLINICAL DATA: Cough and shortness of breath.

EXAM:
CHEST - 2 VIEW

[chest pa]
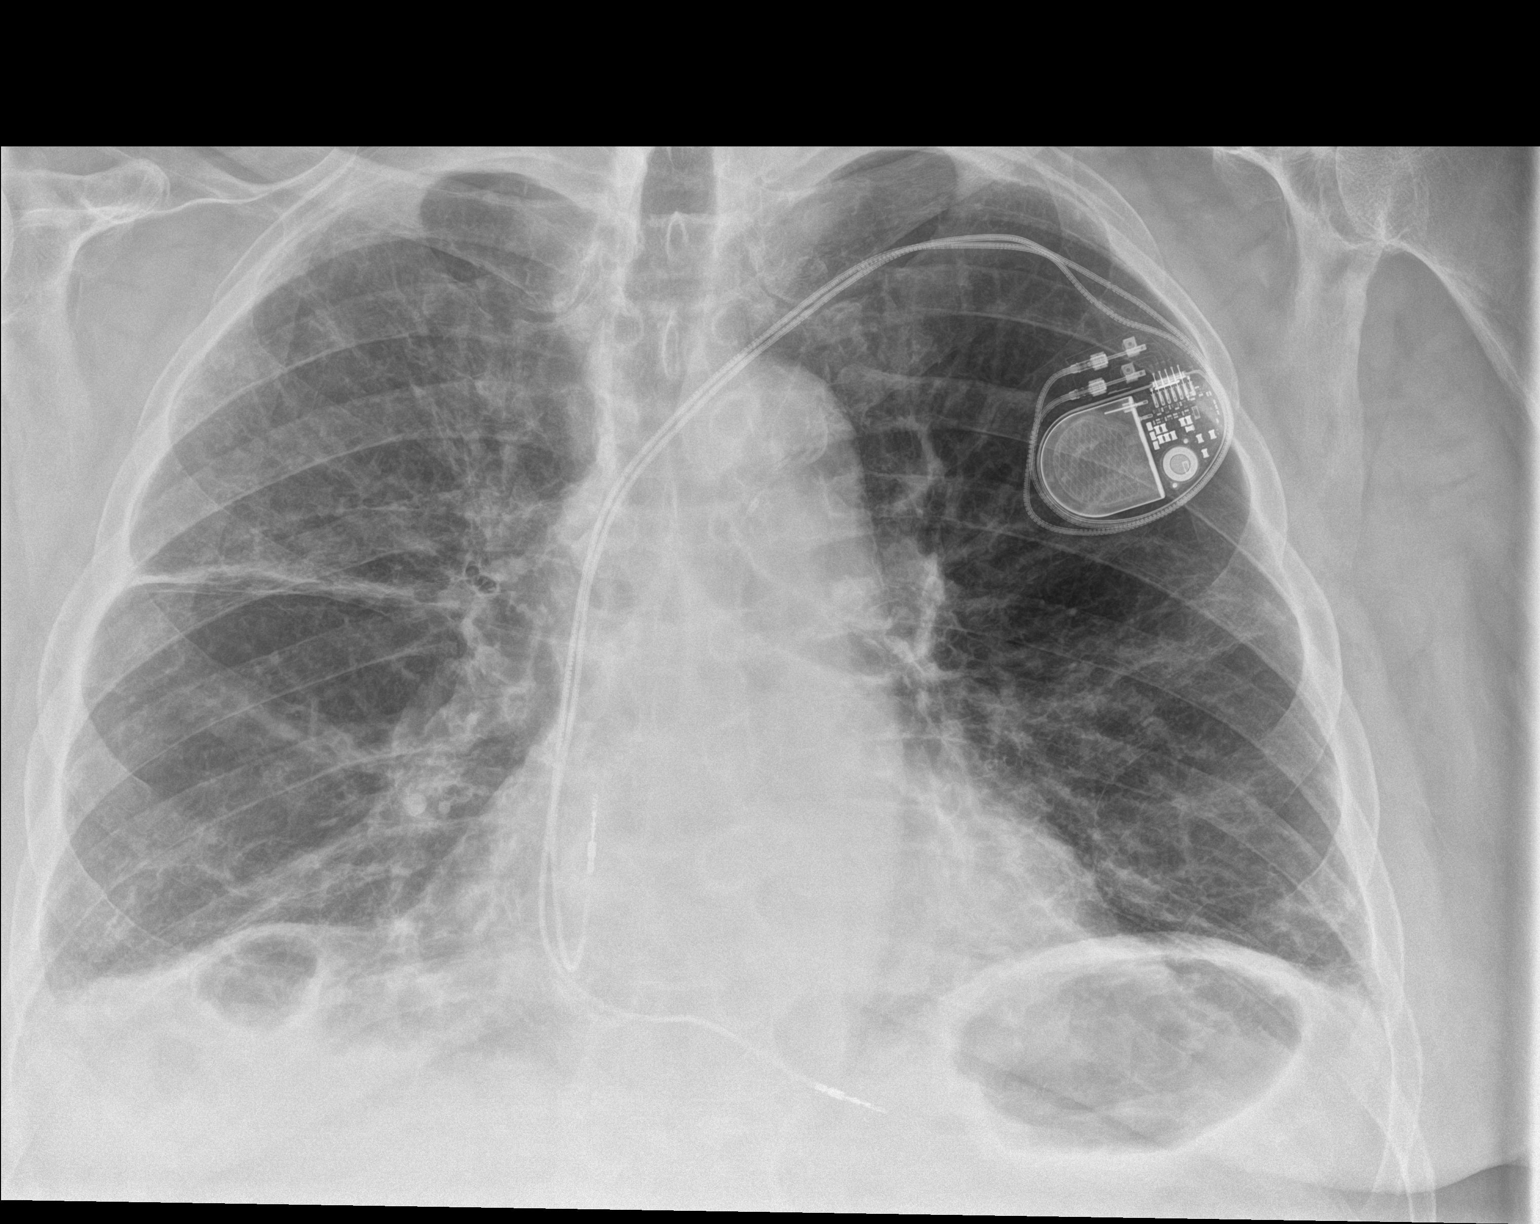

[chest lat]
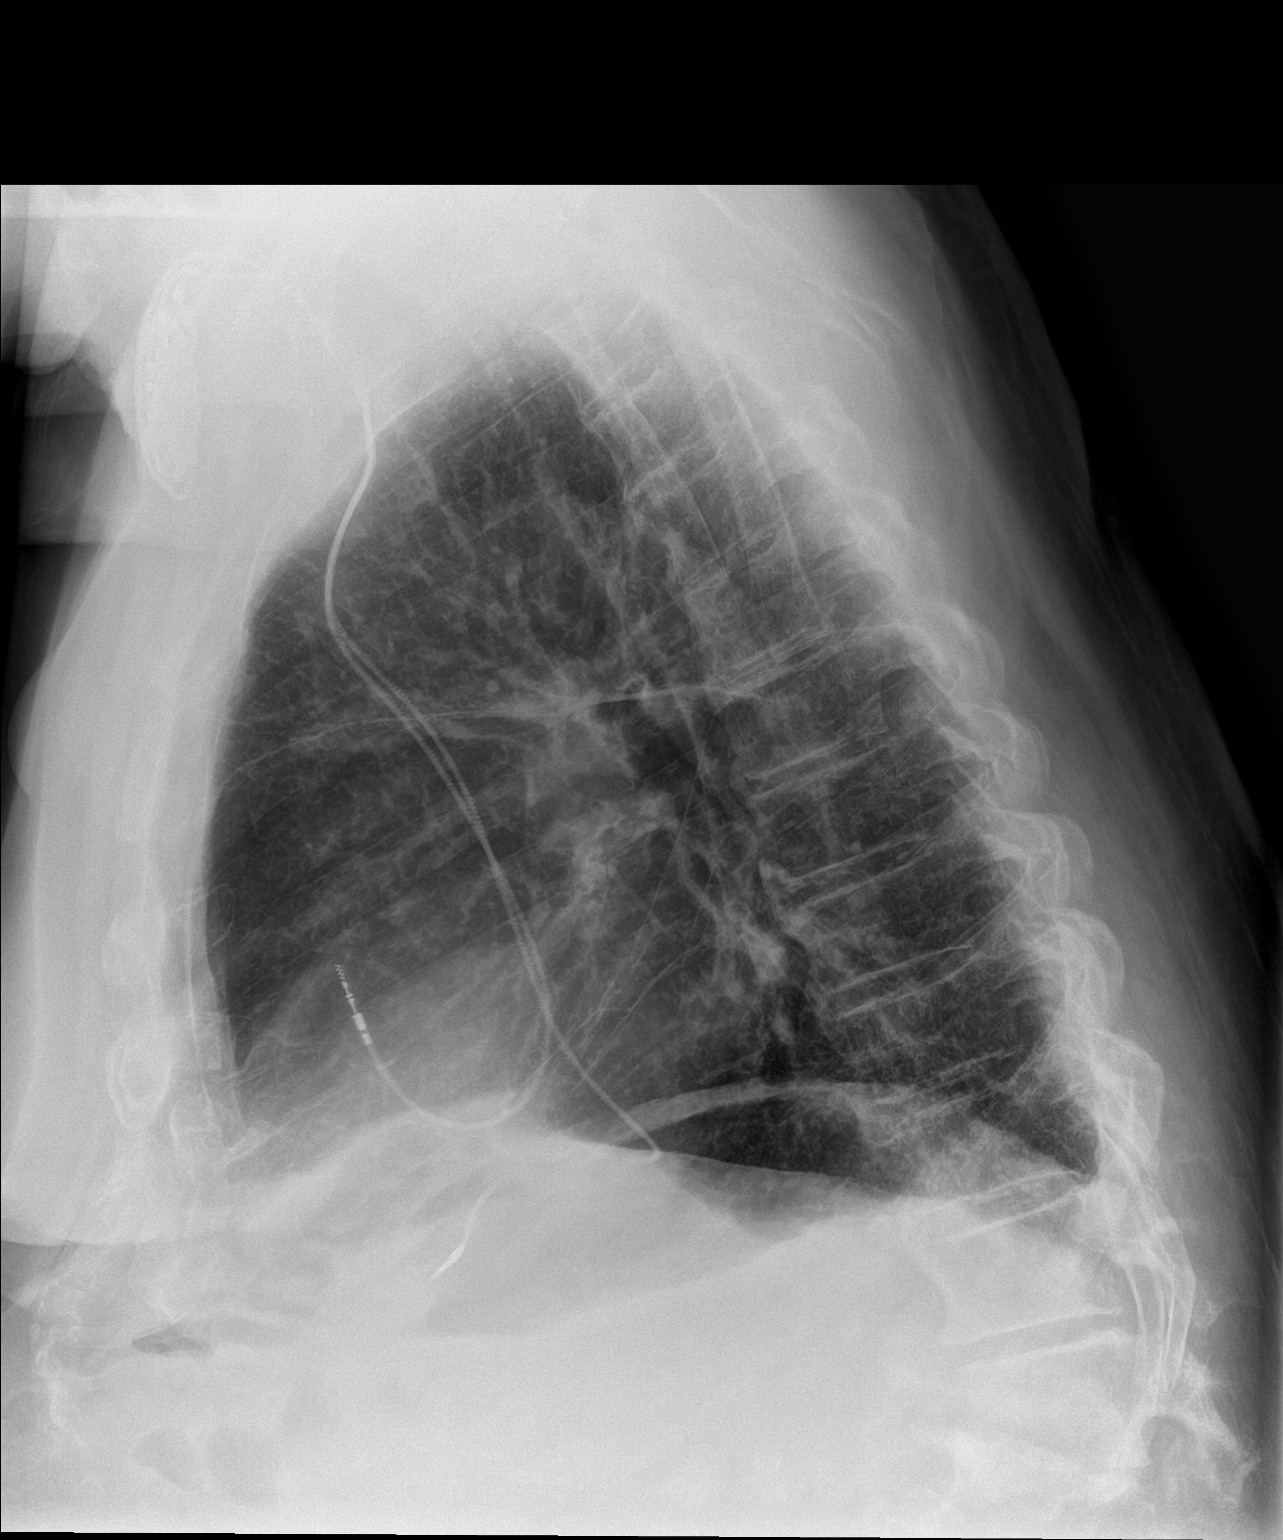

[2 of 2 positions shown; findings below may reference images not displayed]

FINDINGS: The heart size and mediastinal contours are within normal limits.
Aortic atherosclerosis noted. Dual lead transvenous pacemaker
remains in appropriate position.

Previously noted mild opacity at the right lung base is no longer
seen. Mild scarring in the right midlung is stable. No evidence of
acute infiltrate or edema. No evidence of pleural effusion. Stable
old upper lumbar vertebral body compression deformity.
IMPRESSION: Interval resolution of mild right basilar opacity. No active
cardiopulmonary disease.

## 2021-02-23 DIAGNOSIS — Z961 Presence of intraocular lens: Secondary | ICD-10-CM | POA: Diagnosis not present

## 2021-02-23 DIAGNOSIS — H40012 Open angle with borderline findings, low risk, left eye: Secondary | ICD-10-CM | POA: Diagnosis not present

## 2021-02-25 DIAGNOSIS — G4731 Primary central sleep apnea: Secondary | ICD-10-CM | POA: Diagnosis not present

## 2021-02-25 DIAGNOSIS — G4733 Obstructive sleep apnea (adult) (pediatric): Secondary | ICD-10-CM | POA: Diagnosis not present

## 2021-03-03 ENCOUNTER — Telehealth: Payer: Self-pay | Admitting: Cardiology

## 2021-03-03 NOTE — Telephone Encounter (Signed)
Spoke to pt's daughter who report for the past 2 weeks, pt has been experiencing episodes of irregular heart rhythm with heart rate as high as 155. However, daughter report for the past 3 days, pt state he feels like it more consistent and report symptoms of feeling wore out and no energy.  She state pt called her brother this morning requesting he take pt to the hospital but later called back saying he was able to get his HR down to 59 with breathing technique.    Spoke with [facility] and was able to schedule an appointment for tomorrow at 10. Daughter made aware of appointment and ED precaution should any new symptoms develop or worsen.

## 2021-03-03 NOTE — Telephone Encounter (Signed)
   STAT if HR is under 50 or over 120 (normal HR is 60-100 beats per minute)  1) What is your heart rate? 150-155  2) Do you have a log of your heart rate readings (document readings)?   3) Do you have any other symptoms? Pt's daughter calling, she said pt HR been up to 150-155, also having arrhythmia and having trouble sleep at night. She said this is going on for about 2 weeks now and would like to get Dr. Rosezella [place] recommendation

## 2021-03-04 ENCOUNTER — Other Ambulatory Visit: Payer: Self-pay

## 2021-03-04 ENCOUNTER — Ambulatory Visit (HOSPITAL_COMMUNITY)
Admission: RE | Admit: 2021-03-04 | Discharge: 2021-03-04 | Disposition: A | Discharge status: Home / Self Care | Payer: Medicare Other | Source: Ambulatory Visit | Attending: Nurse Practitioner | Admitting: Nurse Practitioner

## 2021-03-04 ENCOUNTER — Ambulatory Visit (INDEPENDENT_AMBULATORY_CARE_PROVIDER_SITE_OTHER): Payer: Medicare Other | Admitting: *Deleted

## 2021-03-04 ENCOUNTER — Encounter (HOSPITAL_COMMUNITY): Payer: Self-pay | Admitting: Nurse Practitioner

## 2021-03-04 VITALS — BP 126/70 | HR 69 | Ht 70.0 in | Wt 246.4 lb

## 2021-03-04 DIAGNOSIS — Z880 Allergy status to penicillin: Secondary | ICD-10-CM | POA: Insufficient documentation

## 2021-03-04 DIAGNOSIS — Z79899 Other long term (current) drug therapy: Secondary | ICD-10-CM | POA: Insufficient documentation

## 2021-03-04 DIAGNOSIS — I10 Essential (primary) hypertension: Secondary | ICD-10-CM | POA: Diagnosis not present

## 2021-03-04 DIAGNOSIS — I471 Supraventricular tachycardia: Secondary | ICD-10-CM | POA: Diagnosis not present

## 2021-03-04 DIAGNOSIS — Z7901 Long term (current) use of anticoagulants: Secondary | ICD-10-CM | POA: Insufficient documentation

## 2021-03-04 DIAGNOSIS — Z888 Allergy status to other drugs, medicaments and biological substances status: Secondary | ICD-10-CM | POA: Insufficient documentation

## 2021-03-04 DIAGNOSIS — Z881 Allergy status to other antibiotic agents status: Secondary | ICD-10-CM | POA: Insufficient documentation

## 2021-03-04 DIAGNOSIS — Z950 Presence of cardiac pacemaker: Secondary | ICD-10-CM | POA: Diagnosis not present

## 2021-03-04 DIAGNOSIS — Z8249 Family history of ischemic heart disease and other diseases of the circulatory system: Secondary | ICD-10-CM | POA: Diagnosis not present

## 2021-03-04 DIAGNOSIS — Z87891 Personal history of nicotine dependence: Secondary | ICD-10-CM | POA: Diagnosis not present

## 2021-03-04 DIAGNOSIS — Z5181 Encounter for therapeutic drug level monitoring: Secondary | ICD-10-CM

## 2021-03-04 DIAGNOSIS — I4819 Other persistent atrial fibrillation: Secondary | ICD-10-CM | POA: Diagnosis not present

## 2021-03-04 LAB — POCT INR: INR: 2.6 (ref 2.0–3.0)

## 2021-03-04 MED ORDER — METOPROLOL SUCCINATE ER 50 MG PO TB24: ORAL_TABLET | ORAL | 11 refills | Status: DC

## 2021-03-04 NOTE — Patient Instructions (Signed)
Increase metoprolol to 75mg  twice a day (1 and 1/2 tablet of your 50mg  tablets twice a day)   Follow up with Dr. Caryl Comes in 4-6 weeks.

## 2021-03-04 NOTE — Progress Notes (Addendum)
Patient ID: Chad [name], male   DOB: [birth year], 87 y.o.   MRN: [id number]     Primary Care Physician: Redmond School, MD Referring Physician: [facility] f/u   Chad [name] is a 87 y.o. 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 He was  started on tikosyn 9/16 and was seen in the afib clinc for f/u in January. He was in SR, no complaints. He had prolonged qtc that day, reviewed with Dr. Caryl Comes and he felt QTC was ok. EKG repeated one week and qtc acceptable.Pt was not having any  afib that pt was aware of. He had been feeling well. He denies any new drugs or change in previous drugs. No change in general health.  He was seen in the afib clinic today, 3/16 due to complaints of having vision dim for a few seconds at a time. He feels liks he could pass out. No syncope. Started a couple of weeks ago. He can be sitting or standing. He is not aware of any sensation of heart racing or skipping with episodes. He was aware of heart racing, for short period of time last night but did not have any episodes of vision dimming with it. He has been instructed not to drive until the origin of these episodes can be further determined.30 day monitor was placed. Within a few days of wearing the monitor, an episode of elevated heart beat was noted, afib with RVR. Metoprolol was added at 25 mg a day and for the remainder of the monitor, there was no further elevated heart rate or episodes of feeling like he would pass out. No significant brady or pauses.   Returns 8/8 and feels well. No episodes of afib that he is aware of and no further episodes of RVR causing presyncopal rhythms, since starting back on metoprolol ER 25 mg a day. HR is office is 52 bpm, but he is not symptomatic with this. Has some issues with peripheral neuropathy but no falls. Continues on tikosyn bid and is compliant with warfarin.  F/u 12/21. He reports that he has had some  lightheaded spells but has not had any syncope. He just finished wearing an event monitor for these symptoms, placed by Dr.Hochrein, and pt will mail back today. Last time pt had these symptoms, monitor showed brief episodes of rapid afib and rate control was increased. He continues on Tikosyn with warfarin.  F/u 3/22. He has felt well. He had one episode of tachycardia but was associated with climbing 8 flights of stairs. No further dizziness spells. Last time he saw Dr. Caryl Comes, he talked to Dr. Percival Spanish re possibly stopping dofetilide and possibility of a PPM. Pt does f/u with Dr. Caryl Comes 4/9 at which time this will be further discussed. Pt was leaving for [facility] right after that appointment.He is slow today at 48 bpm.  F/u in afib clinic 08/04/20. I have not seen pt since 2018. He is here as he had some elevated HR last week. He saw his PCP who increased his metoprolol succinate from  50 mg bid to 100 mg bid last Wednesday. He has not seen any elevated HR since then. He appears to be tolerating this increased dose. He continues on dofetilide 250 mcg bid. He saw Dr. Percival Spanish in August with increased shortness of breath and pedal edema. His lasix was increased. He was found toan URI at that time.  He was not having any  arrhythmia then. Recent echo showed normal EF, but technically challenging study. Today, he feels well. He is in an a paced rhythm. He remains on warfarin  for a CHA2DS2VASc of at least 4.   F/u in afib clinic, 03/04/21. He was referred by Dr. Rosezella [place] office for sensation of palpitations and feeling unwell for a few days this week, seeing HR's up to 155 bpm at night.. He states that he notices this when he lies down to sleep and will make his left arm hurt if it is sustained. He states for several hours last night,  he felt his heart racing but today, his ekg  shows atrial paced with PAC's. Device interrogated and interrupted by [employer] and Chad [name]  can only see a HR of 155 bpm x 44 sec's last  night. The last sustained elevate HR was atrial tach at 150-160 April 11th. Possibly afib at 150 bpm for 8-9 hours March 22 nd. They will lower the detection rate to 120 as they feel the current  detection limit at 145 bpm is possibly not capturing a lot of what pt is feeling. His BB was reduced back in December as it was felt that he was doing well, so plan to increase BB  today. He continues on dofetilide 250 mcg bid with stable qt. .   Today, he denies symptoms of palpitations, chest pain, shortness of breath, orthopnea, PND, lower extremity edema, dizziness, presyncope, syncope, or neurologic sequela. The patient is tolerating medications without difficulties and is otherwise without complaint today.   Past Medical History:  Diagnosis Date  . Bladder cancer ([place]) 2010  . Chronic bronchitis ([place])   . Hypertension   . Hypoglycemia   . On home oxygen therapy    "2L w/CPAP at night" (06/21/2017)  . OSA on CPAP    "w/2L O2 at night" (06/21/2017)  . Persistent atrial fibrillation ([place])       . Pneumonia    "when he was young; again in 2016" (06/21/2017)  . Presence of permanent cardiac pacemaker   . Sensory ataxia    Past Surgical History:  Procedure Laterality Date  . BACK SURGERY    . CARDIOVERSION N/A 04/23/2015   Procedure: CARDIOVERSION;  Surgeon: [name], MD;  Location: [place];  Service: Cardiovascular;  Laterality: N/A;  . CARDIOVERSION N/A 07/30/2015   Procedure: CARDIOVERSION;  Surgeon: [name], MD;  Location: [facility] ENDOSCOPY;  Service: Cardiovascular;  Laterality: N/A;  . EYE SURGERY Bilateral 2009   "to lower pressure; no glaucoma" (06/21/2017)  . INSERT / REPLACE / REMOVE PACEMAKER  06/21/2017  . [place]  . PACEMAKER IMPLANT N/A 06/21/2017   Procedure: Pacemaker Implant;  Surgeon: Deboraha Sprang, MD;  Location: [facility] [facility];  Service: Cardiovascular;  Laterality: N/A;  . TRANSURETHRAL RESECTION OF BLADDER TUMOR WITH GYRUS (TURBT-GYRUS)  07/10/2009     Cystourethroscopy, gyrus TURBT (transurethral  resection of bladder tumor).[name] 03/16/2011    Current Outpatient Medications  Medication Sig Dispense Refill  . ascorbic acid (VITAMIN C) 1000 MG tablet Take 1,000 mg by mouth daily.    . Cholecalciferol (VITAMIN D3) 2000 units capsule Take 2,000 Units by mouth 2 (two) times daily.    . Coenzyme Q10 200 MG TABS Take 200 mg by mouth daily.    Marland Kitchen dofetilide (TIKOSYN) 250 MCG capsule Take 1 capsule by mouth twice daily 180 capsule 2  . escitalopram (LEXAPRO) 10 MG tablet Take 10 mg by mouth at bedtime.    Marland Kitchen  furosemide (LASIX) 20 MG tablet Take 40 mg by mouth daily.     . irbesartan (AVAPRO) 75 MG tablet Take 75 mg by mouth daily.    . metoprolol succinate (TOPROL XL) 50 MG 24 hr tablet Take 75mg  by mouth twice a day 30 tablet 11  . Multiple Vitamins-Minerals (CENTRUM SILVER PO) Take 1 tablet by mouth daily.    . OXYGEN Inhale 2 L into the lungs at bedtime. Uses with CPAP machine    . vitamin B-12 (CYANOCOBALAMIN) 500 MCG tablet Take 500 mcg by mouth 2 (two) times daily.    Marland Kitchen warfarin (COUMADIN) 5 MG tablet TAKE 1/2 TO 1 (ONE-HALF TO ONE) TABLET BY MOUTH ONCE DAILY AS DIRECTED BY COUMADIN CLINIC 90 tablet 1  . ZINC GLUCONATE PO Take 1 tablet by mouth daily.     No current facility-administered medications for this encounter.    Allergies  Allergen Reactions  . Doxycycline Hives  . Amiodarone     Caused neuropathy  . Carbapenems   . Cephalexin Other (See Comments)  . Cephalosporins Other (See Comments)  . Chlorpheniramine Other (See Comments)    Swells prostate Swells prostate  . Ciprofloxacin     Pt can not take with amiodarone   . Decongestant [Oxymetazoline]     All decongestant - makes prostate swell  . Diltiazem Swelling    Left leg swelling  . Flagyl [Metronidazole] Nausea Only    Stomach ache  . Hctz [Hydrochlorothiazide]   . Levaquin [Levofloxacin In D5w] Other (See Comments)    unknown  . Levofloxacin Nausea Only  .  Lidocaine     Pt unsure of  . Penicillins Swelling    Has patient had a PCN reaction causing immediate rash, facial/tongue/throat swelling, SOB or lightheadedness with hypotension: No Has patient had a PCN reaction causing severe rash involving mucus membranes or skin necrosis: No Has patient had a PCN reaction that required hospitalization: No Has patient had a PCN reaction occurring within the last 10 years: No If all of the above answers are "NO", then may proceed with Cephalosporin use. Swelling at injection site  . Teline [Tetracycline] Swelling    Hospitalization for 2 weeks, prostate swelling  . Tetanus Toxoid Swelling    Swells arms, Please pre-medicate with benadryl.  Marland Kitchen Z-Pak [Azithromycin] Other (See Comments)    Pt can not take with Amiodarone   . Sulfonamide Derivatives Rash  . Toprol Xl [Metoprolol Tartrate] Itching and Rash    Pt states he tolerates.    Social History   Socioeconomic History  . Marital status: Married    Spouse name: Chad [name]  . Number of children: 2  . Years of education: 56  . Highest education level: Not on file  Occupational History    Employer: RETIRED  Tobacco Use  . Smoking status: Former Smoker    Packs/day: 0.50    Years: 20.00    Pack years: 10.00    Types: Cigarettes    Quit date: 06/03/1971    Years since quitting: 49.7  . Smokeless tobacco: Never Used  Vaping Use  . Vaping Use: Never used  Substance and Sexual Activity  . Alcohol use: No  . Drug use: No  . Sexual activity: Not on file  Other Topics Concern  . Not on file  Social History Narrative   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   Patient has two children.   Patient is retired.   Patient has a college  education.   Patient is right-handed.   Patient does not drink any caffeine.   Social Determinants of Health   Financial Resource Strain: Not on file  Food Insecurity: Not on file  Transportation Needs: Not on file  Physical Activity: Not on  file  Stress: Not on file  Social Connections: Not on file  Intimate Partner Violence: Not on file    Family History  Problem Relation Age of Onset  . Heart failure Father   . Hypertension Father   . Dementia Mother     ROS- All systems are reviewed and negative except as per the HPI above  Physical Exam: Vitals:   03/04/21 0959  BP: 126/70  Pulse: 69  Weight: 111.8 kg  Height: 5\' 10"  (1.778 m)    GEN- The patient is well appearing, alert and oriented x 3 today.   Head- normocephalic, atraumatic Eyes-  Sclera clear, conjunctiva pink Ears- hearing intact Oropharynx- clear Neck- supple, no JVP Lymph- no cervical lymphadenopathy Lungs- Clear to ausculation bilaterally, normal work of breathing Heart- regular rate and rhythm, no murmurs, rubs or gallops, PMI not laterally displaced GI- soft, NT, ND, + BS Extremities- no clubbing, cyanosis, or edema MS- no significant deformity or atrophy Skin- no rash or lesion Psych- euthymic mood, full affect Neuro- strength and sensation are intact  EKG- Atrial paced rhythm with occasional supraventricular complexes, RBBB, t wave abnormality  PPM interrogated and did not show SVT that fits with pt's description of frequency , detection level lowered to 120, AT/AF burden noted at less than 1% and mode switch less than  1%.   Echo- 1. Technically challenging study. Recommend use of echo contrast for  future studies.  2. Grossly normal LVEF, reduced sensitivity for focal wall motion  abnormalities given technical limitations.. Left ventricular ejection  fraction, by estimation, is 55 to 60%. The left ventricle has normal  function. The left ventricle has no regional  wall motion abnormalities. Left ventricular diastolic parameters are  indeterminate.  3. Right ventricular systolic function was not well visualized. The right  ventricular size is not well visualized. Tricuspid regurgitation signal is  inadequate for assessing PA  pressure.  4. The mitral valve was not well visualized. Trivial mitral valve  regurgitation. No evidence of mitral stenosis. Moderate mitral annular  calcification.  5. The aortic valve was not well visualized. Aortic valve regurgitation  is not visualized.   Assessment and Plan: 1. Perceived tachycardia/ does not match with reported tachycardia per device,   maybe undetected Has noted for several nights recently by  pulse ox in 150 range  Chad [name] with [employer] lowered rate of detection to 120    I will increase  toprol to 50 mg  1/12 tab bid from 50 mg bid, he was on toprol 200 mg bid until last December when it was lowered due to pt doing  well to 50 mg bid  Continue dofetilide 250 mcg bid   2. Htn  Stable  3. CHA2DS2VASc score of 4 Continue  warfain   Nurse check visit with EKG,  BP check in 10 days Will obtain tikosyn labs, bmet/mg then    Refer back to Dr. Caryl Comes in 4-6 weeks  F/u with Dr. Percival Spanish 6/9    Geroge Baseman. Pema Thomure, [facility] [address] [place], [place] [address] [phone]

## 2021-03-04 NOTE — Patient Instructions (Signed)
Using CBD oil for pain Continue warfarin 1/2 tablet daily except 1 tablet on Thursdays Recheck INR in 4 weeks

## 2021-03-16 ENCOUNTER — Ambulatory Visit (HOSPITAL_COMMUNITY)
Admission: RE | Admit: 2021-03-16 | Discharge: 2021-03-16 | Disposition: A | Discharge status: Home / Self Care | Payer: Medicare Other | Source: Ambulatory Visit | Attending: Nurse Practitioner | Admitting: Nurse Practitioner

## 2021-03-16 ENCOUNTER — Other Ambulatory Visit: Payer: Self-pay

## 2021-03-16 VITALS — BP 132/82

## 2021-03-16 DIAGNOSIS — I4819 Other persistent atrial fibrillation: Secondary | ICD-10-CM

## 2021-03-16 DIAGNOSIS — I480 Paroxysmal atrial fibrillation: Secondary | ICD-10-CM

## 2021-03-16 LAB — MAGNESIUM: Magnesium: 2.5 mg/dL — ABNORMAL HIGH (ref 1.7–2.4)

## 2021-03-16 LAB — BASIC METABOLIC PANEL
Anion gap: 10 (ref 5–15)
BUN: 16 mg/dL (ref 8–23)
CO2: 28 mmol/L (ref 22–32)
Calcium: 9.2 mg/dL (ref 8.9–10.3)
Chloride: 99 mmol/L (ref 98–111)
Creatinine, Ser: 1.31 mg/dL — ABNORMAL HIGH (ref 0.61–1.24)
GFR, Estimated: 52 mL/min — ABNORMAL LOW (ref >60)
Glucose, Bld: 143 mg/dL — ABNORMAL HIGH (ref 70–99)
Potassium: 4.2 mmol/L (ref 3.5–5.1)
Sodium: 137 mmol/L (ref 135–145)

## 2021-03-16 NOTE — Addendum Note (Signed)
Encounter addended by: Sherran Needs, NP on: 03/16/2021 9:26 AM  Actions taken: Clinical Note Signed

## 2021-03-16 NOTE — Progress Notes (Signed)
Pt in for increase in BB. He reports no further heart racing with BB  increased from  50 mg bid to 75 mg bid. BP stable at  132/82. tikosyn labs drawn.

## 2021-03-27 DIAGNOSIS — G4731 Primary central sleep apnea: Secondary | ICD-10-CM | POA: Diagnosis not present

## 2021-03-27 DIAGNOSIS — G4733 Obstructive sleep apnea (adult) (pediatric): Secondary | ICD-10-CM | POA: Diagnosis not present

## 2021-04-01 ENCOUNTER — Ambulatory Visit (INDEPENDENT_AMBULATORY_CARE_PROVIDER_SITE_OTHER): Payer: Medicare Other | Admitting: *Deleted

## 2021-04-01 DIAGNOSIS — I4819 Other persistent atrial fibrillation: Secondary | ICD-10-CM

## 2021-04-01 DIAGNOSIS — Z5181 Encounter for therapeutic drug level monitoring: Secondary | ICD-10-CM

## 2021-04-01 LAB — POCT INR: INR: 2.5 (ref 2.0–3.0)

## 2021-04-01 NOTE — Patient Instructions (Signed)
Using CBD oil for pain Continue warfarin 1/2 tablet daily except 1 tablet on Thursdays Recheck INR in 6 weeks

## 2021-04-04 ENCOUNTER — Other Ambulatory Visit: Payer: Self-pay | Admitting: Cardiology

## 2021-04-05 NOTE — Telephone Encounter (Signed)
Rx(s) sent to pharmacy electronically.  

## 2021-04-06 ENCOUNTER — Telehealth: Payer: Self-pay | Admitting: Neurology

## 2021-04-06 NOTE — Telephone Encounter (Signed)
Called the patient's daughter.  There was no answer.  Left a detailed message advising that the patient has a scheduled visit for 04/07/2021.  This visit appeared to be for a initial ASV set up through [employer].  I do not see any data indicating he started his new machine.  We have to have 61 to 90 days of data on the machine.   Advised the daughter to call back to let us know if she would like to cancel and push this appointment out.  If they continue to keep the appointment, that is fine to.  Patient would just need to bring the card in the machine or the machine and power cord for download.  ** if daughter calls back please ask if the pt started the "new ASV machine" if not then we can push this appt out. If so, please make sure he has had it 61-90 days and for him to bring the machine and power cord for download.

## 2021-04-07 ENCOUNTER — Ambulatory Visit: Payer: Medicare Other | Admitting: Neurology

## 2021-04-07 ENCOUNTER — Encounter: Payer: Self-pay | Admitting: Neurology

## 2021-04-07 VITALS — BP 134/86 | HR 63 | Ht 70.0 in | Wt 247.0 lb

## 2021-04-07 DIAGNOSIS — G4752 REM sleep behavior disorder: Secondary | ICD-10-CM | POA: Insufficient documentation

## 2021-04-07 DIAGNOSIS — G620 Drug-induced polyneuropathy: Secondary | ICD-10-CM | POA: Diagnosis not present

## 2021-04-07 DIAGNOSIS — Z9981 Dependence on supplemental oxygen: Secondary | ICD-10-CM | POA: Insufficient documentation

## 2021-04-07 DIAGNOSIS — I4819 Other persistent atrial fibrillation: Secondary | ICD-10-CM | POA: Diagnosis not present

## 2021-04-07 DIAGNOSIS — Z789 Other specified health status: Secondary | ICD-10-CM | POA: Diagnosis not present

## 2021-04-07 DIAGNOSIS — T462X5A Adverse effect of other antidysrhythmic drugs, initial encounter: Secondary | ICD-10-CM

## 2021-04-07 NOTE — Progress Notes (Signed)
SLEEP MEDICINE CLINIC    Provider:  Larey Seat, MD  Primary Care Physician:  [facility], [place] [id number]     Referring Provider: Dr Percival Spanish, MD         Chief Complaint according to patient   Patient presents with:    . New Patient (Initial Visit)     New on ASV with FFM , has trouble tolerating it.       HISTORY OF PRESENT ILLNESS:  Chad [name] is a 87year old  Caucasian male patient and is seen here upon a RV on 04-07-2021: Daughter of this patient is on the phone- we are discussing the results of his last sleep study and download.  Chad [name] reports that he feels his current pressure is too much and he has multiple very high air leakage creating [name] artificially high AHI.  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  He uses 2 L of oxygen by the time he was presenting for a reevaluation and had a new diagnosis of pulmonary fibrosis.  CPAP was initiated at night on 7 February at 8 then 10 and finally 12 cmH2O but did not eliminate apnea or hypoxemia.  The patient stated that he did not use a chinstrap here in the lab while he has been using 1 at home this may be one of the explanations why he has such high air leakage.  Central apnea emerged on the CPAP so BiPAP was attempted and still had the same result at 13/9 and at 14/10 centimeters water pressure.  Finally the technologist Chad [name], switch the patient to ASV and he used 15 cm maximum and 10 cm minimum pressure and 4 cm deep the patient was the patient was able to reach [name] AHI of only 0.5/h.  There was also some evidence of REM sleep behavior disorder during the night.  He used a full facemask ResMed 30 and medium size as fitted by the technologist at night as he states now he feels this is  too much pressure and I would support changing him back to his old mask and chinstrap in order to keep a more comfortable set up.  I also am willing to reduce the ASV pressure because it is entirely possible that only the full facemask made it tolerable.     referral from Dr. Percival Spanish on  04/07/2021 . Chief concern according to patient :  the patient reports increasing shortness of breath, sudden onset in 06-2020. Patient felt too tired on high beta blockers, is on Tikosyn and on anticoagulation on Warfarin. He was diagnosed with pulmonary fibrosis by Dr Caryl Comes in 09-2020. He has a pacemaker. He is in chronic pain. Pain managing deferred as he is anticoagulated.  He uses 2 liters oxygen.    I have the pleasure of seeing Chad [name] today, a right-handed Caucasian male with a known sleep disorder.  He  has a past medical history of Bladder cancer ([place]) (2010), Chronic bronchitis ([place]), Hypertension, Hypoglycemia, On home oxygen therapy, on OSA on CPAP, Persistent atrial fibrillation ([place]), Pneumonia, Presence of permanent cardiac pacemaker, and DDD and spinal degeneration, chronic pain, and ( NS Dr. Quentin Cornwall )  ataxia..  The patient had the first sleep study in  the year 2014 at [facility] sleep- with a result of [name] AHI ( Apnea Hypopnea index)  Of 63.8/h , and low oxygen saturation for 20 minutes  Nadir at SP02 80%. Many PLMs attributed to spinal stenosis. Atrial fib.   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  Worked night shifts all his career. Family status is widowed with adult children, was married 58 years. Wife died 09-18-19. He had 6 sisters and still has 6 siblings living, older brother and older sister. One sister died of COVID.  Tobacco use- history of smoking, quit 46 year ago.  ETOH use; 3 times a week,  Caffeine intake in form of Coffee( /) Soda( /) Tea ( /) some dark chocolate. Regular  exercise in form of walking , limited.   Hobbies : 1991, Special educational needs teacher, genealogy.     Sleep habits are as follows: The patient's dinner time is between 6-7  PM. Snacks at 11 PM.  The patient goes to bed at 2-3 AM and continues to sleep for many hours,often 3-4  hours, wakes for one-ywo bathroom breaks.   The preferred sleep position is supine due to CPAP- and O2.  , with the support of 1-2 pillows. Dreams are reportedly very frequent/vivid.  11-noon AM is the usual rise time. The patient wakes up spontaneously.  He  reports not feeling refreshed or restored in AM, with symptoms such as dry mouth, and stiffness, residual fatigue.  Naps are taken infrequently, but he dozes off- on his couch . These are power naps.     Review of Systems: Out of a complete 14 system review, the patient complains of only the following symptoms, and all other reviewed systems are negative.:  Fatigue, sleepiness , snoring, fragmented sleep, sleeps on CPAP , but snores through.PLMs and Pain from spinal stenosis.   How likely are you to doze in the following situations: 0 = not likely, 1 = slight chance, 2 = moderate chance, 3 = high chance   Sitting and Reading? Watching Television? Sitting inactive in a public place (theater or meeting)? As a passenger in a car for [name] hour without a break? Lying down in the afternoon when circumstances permit? Sitting and talking to someone? Sitting quietly after lunch without alcohol? In a car, while stopped for a few minutes in traffic?   Total = 12/ 24 points - on ASV 5 points. Power naps all the time /.   FSS endorsed at 49/ 63 points.,nowon ASV at 29 points.   Social History   Socioeconomic History  . Marital status: Married    Spouse name: Chad [name]  . Number of children: 2  . Years of education: 5  . Highest education level: Not on file  Occupational History    Employer: RETIRED  Tobacco Use  . Smoking status: Former Smoker    Packs/day: 0.50    Years: 20.00     Pack years: 10.00    Types: Cigarettes    Quit date: 06/03/1971    Years since quitting: 49.8  . Smokeless tobacco: Never Used  Vaping Use  . Vaping Use: Never used  Substance and Sexual Activity  . Alcohol use: No  . Drug use: No  . Sexual activity: Not on file  Other Topics Concern  . Not on file  Social History Narrative   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  Patient has two children.   Patient is retired.   Patient has a college education.   Patient is right-handed.   Patient does not drink any caffeine.   Social Determinants of Health   Financial Resource Strain: Not on file  Food Insecurity: Not on file  Transportation Needs: Not on file  Physical Activity: Not on file  Stress: Not on file  Social Connections: Not on file    Family History  Problem Relation Age of Onset  . Heart failure Father   . Hypertension Father   . Dementia Mother     Past Medical History:  Diagnosis Date  . Bladder cancer ([place]) 2010  . Chronic bronchitis ([place])   . Hypertension   . Hypoglycemia   . On home oxygen therapy    "2L w/CPAP at night" (06/21/2017)  . OSA on CPAP    "w/2L O2 at night" (06/21/2017)  . Persistent atrial fibrillation ([place])       . Pneumonia    "when he was young; again in 2016" (06/21/2017)  . Presence of permanent cardiac pacemaker   . Sensory ataxia     Past Surgical History:  Procedure Laterality Date  . BACK SURGERY    . CARDIOVERSION N/A 04/23/2015   Procedure: CARDIOVERSION;  Surgeon: [name], MD;  Location: [facility];  Service: Cardiovascular;  Laterality: N/A;  . CARDIOVERSION N/A 07/30/2015   Procedure: CARDIOVERSION;  Surgeon: [name], MD;  Location: [facility] ENDOSCOPY;  Service: Cardiovascular;  Laterality: N/A;  . EYE SURGERY Bilateral 2009   "to lower pressure; no glaucoma" (06/21/2017)  . INSERT / REPLACE / REMOVE PACEMAKER  06/21/2017  . [place]  . PACEMAKER IMPLANT N/A 06/21/2017   Procedure:  Pacemaker Implant;  Surgeon: Deboraha Sprang, MD;  Location: [facility] CV LAB;  Service: Cardiovascular;  Laterality: N/A;  . TRANSURETHRAL RESECTION OF BLADDER TUMOR WITH GYRUS (TURBT-GYRUS)  07/10/2009    Cystourethroscopy, gyrus TURBT (transurethral  resection of bladder tumor).[name] 03/16/2011     Current Outpatient Medications on File Prior to Visit  Medication Sig Dispense Refill  . ascorbic acid (VITAMIN C) 1000 MG tablet Take 1,000 mg by mouth daily.    . Cholecalciferol (VITAMIN D3) 2000 units capsule Take 2,000 Units by mouth 2 (two) times daily.    . Coenzyme Q10 200 MG TABS Take 200 mg by mouth daily.    Marland Kitchen dofetilide (TIKOSYN) 250 MCG capsule Take 1 capsule by mouth twice daily 180 capsule 2  . escitalopram (LEXAPRO) 10 MG tablet Take 10 mg by mouth at bedtime.    . furosemide (LASIX) 20 MG tablet Take 1 tablet by mouth twice daily 180 tablet 1  . irbesartan (AVAPRO) 75 MG tablet Take 75 mg by mouth daily.    . metoprolol succinate (TOPROL XL) 50 MG 24 hr tablet Take 75mg  by mouth twice a day 30 tablet 11  . Multiple Vitamins-Minerals (CENTRUM SILVER PO) Take 1 tablet by mouth daily.    . OXYGEN Inhale 2 L into the lungs at bedtime. Uses with CPAP machine    . vitamin B-12 (CYANOCOBALAMIN) 500 MCG tablet Take 500 mcg by mouth 2 (two) times daily.    Marland Kitchen warfarin (COUMADIN) 5 MG tablet TAKE 1/2 TO 1 (ONE-HALF TO ONE) TABLET BY MOUTH ONCE DAILY AS DIRECTED BY COUMADIN CLINIC 90 tablet 1  . ZINC GLUCONATE PO Take 1 tablet by mouth daily.     No current facility-administered medications on file prior to  visit.    Allergies  Allergen Reactions  . Doxycycline Hives  . Amiodarone     Caused neuropathy  . Carbapenems   . Cephalexin Other (See Comments)  . Cephalosporins Other (See Comments)  . Chlorpheniramine Other (See Comments)    Swells prostate Swells prostate  . Ciprofloxacin     Pt can not take with amiodarone   . Decongestant [Oxymetazoline]     All decongestant -  makes prostate swell  . Diltiazem Swelling    Left leg swelling  . Flagyl [Metronidazole] Nausea Only    Stomach ache  . Hctz [Hydrochlorothiazide]   . Levaquin [Levofloxacin In D5w] Other (See Comments)    unknown  . Levofloxacin Nausea Only  . Lidocaine     Pt unsure of  . Penicillins Swelling    Has patient had a PCN reaction causing immediate rash, facial/tongue/throat swelling, SOB or lightheadedness with hypotension: No Has patient had a PCN reaction causing severe rash involving mucus membranes or skin necrosis: No Has patient had a PCN reaction that required hospitalization: No Has patient had a PCN reaction occurring within the last 10 years: No If all of the above answers are "NO", then may proceed with Cephalosporin use. Swelling at injection site  . Teline [Tetracycline] Swelling    Hospitalization for 2 weeks, prostate swelling  . Tetanus Toxoid Swelling    Swells arms, Please pre-medicate with benadryl.  Marland Kitchen Z-Pak [Azithromycin] Other (See Comments)    Pt can not take with Amiodarone   . Sulfonamide Derivatives Rash  . Toprol Xl [Metoprolol Tartrate] Itching and Rash    Pt states he tolerates.    Physical exam:  Today's Vitals   04/07/21 1500  BP: 134/86  Pulse: 63  SpO2: 94%  Weight: 247 lb (112 kg)  Height: 5\' 10"  (1.778 m)   Body mass index is 35.44 kg/m.   Wt Readings from Last 3 Encounters:  04/07/21 247 lb (112 kg)  03/04/21 246 lb 6.4 oz (111.8 kg)  12/02/20 246 lb (111.6 kg)     Ht Readings from Last 3 Encounters:  04/07/21 5\' 10"  (1.778 m)  03/04/21 5\' 10"  (1.778 m)  12/02/20 5\' 10"  (1.778 m)      General: The patient is awake, alert and appears not in acute distress. The patient is well groomed. Head: Normocephalic, atraumatic. Neck is supple. Mallampati 1 - wide open but dark red. ,  neck circumference 17.5  inches . Nasal airflow  patent.  Retrognathia is not seen.  Dental status: dentures.  Cardiovascular:  Regular rate and cardiac  rhythm by pulse,  without distended neck veins. Respiratory: Lungs are clear to auscultation.  Skin:  Without evidence of ankle edema, or rash. Trunk: The patient's posture is erect.   Neurologic exam : The patient is awake and alert, oriented to place and time.   Memory subjective described as intact.  Attention span & concentration ability appears normal.  Speech is fluent,  without  dysarthria, dysphonia or aphasia.  Mood and affect are appropriate.   Cranial nerves: no loss of smell or taste reported  Pupils are equal and briskly reactive to light. Funduscopic exam deferred.  Extraocular movements in vertical and horizontal planes were intact and without nystagmus. No Diplopia. Visual fields by finger perimetry are intact. Hearing was intact to soft voice and finger rubbing. Facial sensation intact to fine touch.  Facial motor strength is symmetric and tongue and uvula move midline.  Neck ROM : rotation, tilt and flexion  extension were normal for age and shoulder shrug was symmetrical.    Motor exam:  Symmetric bulk, tone and ROM.  hip pain and lower back.  Normal tone without cogwheeling, symmetric grip strength . Sensory:  Fine touch, pinprick and vibration were tested  and  normal.  Proprioception tested in the upper extremities was normal. Coordination: Rapid alternating movements in the fingers/hands were of normal speed.  The Finger-to-nose maneuver was intact without evidence of ataxia, dysmetria or tremor.  Gait and station: Patient could not rise unassisted from a seated position, he walked with a cane as  assistive device.  Deep tendon reflexes: in the  upper and lower extremities are symmetrically attenuated, trace only.      After spending a total time of 29 minutes face to face and additional time for physical and neurologic examination, review of laboratory studies,  personal review of imaging studies, reports and results of other testing and review of referral  information / records as far as provided in visit, I have established the following assessments:   Difficulties with adapt servo ventilation.   1) REM BD  2)  He has atrial fibrillation- on anticoagulation, on CPAP and oxygen, there is newly diagnosed pulmonary fibrosis. Oxygen dependent. Now treated on ASV but has very high air leakage. In the lab this seemed to work, we switch back to chin strap and nasal pillow interface.  3) He has a history of spinal stenosis, amiodarone neuropathy.    My Plan is to proceed with: change in interface , add a chin strap and return to nasal pillow, keep on 2 liters, [employer]. / Linna Hoff. Will reduce maximum pressure support by 2 cm and minimum pressure support as well.  13 cm max pressure support, and from 10 to 8 cm minimum pressure support.     I would like to thank Dr Percival Spanish, MD ,for allowing me to meet with and to take care of this pleasant patient.   In short, Chad [name] is presenting with a need for continued PAP therapy, but may have a different need for Oxygen and for BiPAP and possible ASV.  I plan to follow up either personally or through our NP within 2-3 month.   CC: I will share my notes with PCP.   Electronically signed by: Larey Seat, MD 04/07/2021 3:31 PM  [facility] and [facility] Sleep Board certified by The [employer] of Sleep Medicine and Diplomate of the [employer] of Sleep Medicine. Board certified In Neurology through the [place], Fellow of the [employer] of Neurology. Medical Director of [employer].

## 2021-04-08 ENCOUNTER — Ambulatory Visit: Payer: Medicare Other | Admitting: Podiatry

## 2021-04-08 ENCOUNTER — Other Ambulatory Visit: Payer: Self-pay

## 2021-04-08 DIAGNOSIS — Z7901 Long term (current) use of anticoagulants: Secondary | ICD-10-CM | POA: Diagnosis not present

## 2021-04-08 DIAGNOSIS — M79674 Pain in right toe(s): Secondary | ICD-10-CM

## 2021-04-08 DIAGNOSIS — M79675 Pain in left toe(s): Secondary | ICD-10-CM | POA: Diagnosis not present

## 2021-04-08 DIAGNOSIS — B351 Tinea unguium: Secondary | ICD-10-CM | POA: Diagnosis not present

## 2021-04-11 NOTE — Progress Notes (Signed)
Subjective: 87 y.o. returns the office today for painful, elongated, thickened toenails which they cannot trim themself.  States he is still using the topical antifungal and part of the nail did come off on the left side.  He has not been using it recently since the nail came off.  States the color is been looking better since this occurred as well.  Denies any systemic complaints such as fevers, chills, nausea, vomiting.   He is on Coumadin  PCP: [name], MD   Objective: AAO 3, NAD DP/PT pulses palpable, CRT less than 3 seconds Nails hypertrophic, dystrophic, elongated, brittle, discolored 10.  Left hallux toenail appears to be the most affected there is tenderness overlying the nails 1-5 bilaterally.  It does appear that part of the thicker portion of nail came off and there is no open sore to the right hallux.  There is no surrounding erythema or drainage along the nail sites. No open lesions or pre-ulcerative lesions are identified. No pain with calf compression, swelling, warmth, erythema.  Assessment: Patient presents with symptomatic onychomycosis; chronic anticoagulation  Plan: -Treatment options including alternatives, risks, complications were discussed -Nails sharply debrided 10 without complication/bleeding. -Hold off on topical antifungal for now.  He previously was using coconut oil which she thought did well he can continue with this.  This will least help keep the nail soft. -Discussed daily foot inspection. If there are any changes, to call the office immediately.  -Follow-up in 3 months or sooner if any problems are to arise. In the meantime, encouraged to call the office with any questions, concerns, changes symptoms.  Celesta Gentile, DPM

## 2021-04-13 ENCOUNTER — Ambulatory Visit: Payer: Medicare Other | Admitting: Internal Medicine

## 2021-04-13 ENCOUNTER — Other Ambulatory Visit: Payer: Self-pay

## 2021-04-13 ENCOUNTER — Encounter: Payer: Self-pay | Admitting: Internal Medicine

## 2021-04-13 VITALS — BP 157/92 | HR 65 | Ht 70.0 in | Wt 248.8 lb

## 2021-04-13 DIAGNOSIS — I471 Supraventricular tachycardia: Secondary | ICD-10-CM | POA: Diagnosis not present

## 2021-04-13 DIAGNOSIS — I495 Sick sinus syndrome: Secondary | ICD-10-CM

## 2021-04-13 DIAGNOSIS — I4819 Other persistent atrial fibrillation: Secondary | ICD-10-CM | POA: Diagnosis not present

## 2021-04-13 DIAGNOSIS — Z950 Presence of cardiac pacemaker: Secondary | ICD-10-CM | POA: Diagnosis not present

## 2021-04-13 NOTE — Progress Notes (Signed)
Patient ID: Chad [name], male   DOB: 1929-08-08, 87 y.o.   MRN: [id number]      Patient Care Team: Redmond School, MD as PCP - General (Internal Medicine) Minus Breeding, MD as PCP - Cardiology (Cardiology)     Chad [name] is a 87 y.o. male Seen in follow-up for pacing [employer]  undertaken 2018 for sinus node dysfunction.  He also has atrial fibrillation nonsustained ventricular tachycardia; anticoagulated with warfarin, without significant bleeding.  Currently taking dofetilide-- no significant interval atrial fibrillation   The patient denies chest pain, nocturnal dyspnea and orthopnea.There have been no palpitations, lightheadedness or syncope.  Complains of exertional SOB when walking even less than 100 feet- Using O2 now at night. LE Edema- L leg has more swelling as compared to his R leg- secondary to him.  Hydrating Excessively  > 2L     Accompanied by his Daughter Sleeps well in his bed at night   Weight fluctuates between 250-55lbs   Wt Readings from Last 3 Encounters:  04/13/21 248 lb 12.8 oz (112.9 kg)  04/07/21 247 lb (112 kg)  03/04/21 246 lb 6.4 oz (111.8 kg)    DATE TEST EF   1/15 Echo   60-65 %   9/21 Echo  55-60%     Date Cr K Mg Hgb  7/18  1.16 4.5 2.2 15.2  11/18  1.06 4.5 2.3   3/20 1.12 4.4  14.0   10/20 1.11 4.4 2.3   8/21 1.07 5.0    9/21 1.34 4.2 2.2   5/22 1.31 4.2 2.5      Past Medical History:  Diagnosis Date  . Bladder cancer ([place]) 2010  . Chronic bronchitis ([place])   . Hypertension   . Hypoglycemia   . On home oxygen therapy    "2L w/CPAP at night" (06/21/2017)  . OSA on CPAP    "w/2L O2 at night" (06/21/2017)  . Persistent atrial fibrillation ([place])       . Pneumonia    "when he was young; again in 2016" (06/21/2017)  . Presence of permanent cardiac pacemaker   . Sensory ataxia     Past Surgical History:  Procedure Laterality Date  . BACK SURGERY    . CARDIOVERSION N/A 04/23/2015   Procedure: CARDIOVERSION;  Surgeon: [name], MD;  Location: [place];  Service: Cardiovascular;  Laterality: N/A;  . CARDIOVERSION N/A 07/30/2015   Procedure: CARDIOVERSION;  Surgeon: [name], MD;  Location: [facility] ENDOSCOPY;  Service: Cardiovascular;  Laterality: N/A;  . EYE SURGERY Bilateral 2009   "to lower pressure; no glaucoma" (06/21/2017)  . INSERT / REPLACE / REMOVE PACEMAKER  06/21/2017  . [place]  . PACEMAKER IMPLANT N/A 06/21/2017   Procedure: Pacemaker Implant;  Surgeon: Deboraha Sprang, MD;  Location: [place] CV [facility];  Service: Cardiovascular;  Laterality: N/A;  . TRANSURETHRAL RESECTION OF BLADDER TUMOR WITH GYRUS (TURBT-GYRUS)  07/10/2009    Cystourethroscopy, gyrus TURBT (transurethral  resection of bladder tumor).[name] 03/16/2011    Current Outpatient Medications  Medication Sig Dispense Refill  . ascorbic acid (VITAMIN C) 1000 MG tablet Take 1,000 mg by mouth daily.    . Cholecalciferol (VITAMIN D3) 2000 units capsule Take 2,000 Units by mouth 2 (two) times daily.    . Coenzyme Q10 200 MG TABS Take 200 mg by mouth daily.    Marland Kitchen dofetilide (TIKOSYN) 250 MCG capsule Take 1 capsule by mouth twice daily 180 capsule 2  .  escitalopram (LEXAPRO) 10 MG tablet Take 10 mg by mouth at bedtime.    . furosemide (LASIX) 20 MG tablet Take 1 tablet by mouth twice daily 180 tablet 1  . irbesartan (AVAPRO) 75 MG tablet Take 75 mg by mouth daily.    . metoprolol succinate (TOPROL XL) 50 MG 24 hr tablet Take 75mg  by mouth twice a day 30 tablet 11  . Multiple Vitamins-Minerals (CENTRUM SILVER PO) Take 1 tablet by mouth daily.    . OXYGEN Inhale 2 L into the lungs at bedtime. Uses with CPAP machine    . vitamin B-12 (CYANOCOBALAMIN) 500 MCG tablet Take 500 mcg by mouth 2 (two) times daily.    Marland Kitchen warfarin (COUMADIN) 5 MG tablet TAKE 1/2 TO 1 (ONE-HALF TO ONE) TABLET BY MOUTH ONCE DAILY AS DIRECTED BY COUMADIN CLINIC 90 tablet 1  . ZINC GLUCONATE PO Take 1 tablet by mouth daily.     No current  facility-administered medications for this visit.    Allergies  Allergen Reactions  . Doxycycline Hives  . Amiodarone     Caused neuropathy  . Carbapenems   . Cephalexin Other (See Comments)  . Cephalosporins Other (See Comments)  . Chlorpheniramine Other (See Comments)    Swells prostate Swells prostate  . Ciprofloxacin     Pt can not take with amiodarone   . Decongestant [Oxymetazoline]     All decongestant - makes prostate swell  . Diltiazem Swelling    Left leg swelling  . Flagyl [Metronidazole] Nausea Only    Stomach ache  . Hctz [Hydrochlorothiazide]   . Levaquin [Levofloxacin In D5w] Other (See Comments)    unknown  . Levofloxacin Nausea Only  . Lidocaine     Pt unsure of  . Penicillins Swelling    Has patient had a PCN reaction causing immediate rash, facial/tongue/throat swelling, SOB or lightheadedness with hypotension: No Has patient had a PCN reaction causing severe rash involving mucus membranes or skin necrosis: No Has patient had a PCN reaction that required hospitalization: No Has patient had a PCN reaction occurring within the last 10 years: No If all of the above answers are "NO", then may proceed with Cephalosporin use. Swelling at injection site  . Teline [Tetracycline] Swelling    Hospitalization for 2 weeks, prostate swelling  . Tetanus Toxoid Swelling    Swells arms, Please pre-medicate with benadryl.  Marland Kitchen Z-Pak [Azithromycin] Other (See Comments)    Pt can not take with Amiodarone   . Sulfonamide Derivatives Rash  . Toprol Xl [Metoprolol Tartrate] Itching and Rash    Pt states he tolerates.      Review of Systems negative except from HPI and PMH  Physical Exam: BP (!) 157/92   Pulse 65   Ht 5\' 10"  (1.778 m)   Wt 248 lb 12.8 oz (112.9 kg)   SpO2 93%   BMI 35.70 kg/m  Well developed and well nourished in no acute distress HENT normal Neck supple with JVP about 10  Lungs Crackles acute Oak Device pocket well healed; without hematoma or  erythema.  There is no tethering  Regular rate and rhythm, no  gallop No  murmur Abd-soft with active BS No Clubbing cyanosis 1+ on the right and 2+ on the left peripheral edema Skin-warm and dry A & Oriented  Grossly normal sensory and motor function  ECG AV pacing at 65 Interval 24/12/45 Right bundle branch block   ECG atrial pacing with intermittent and frequent atrial ectopy Intervals 20/14/42 with  right bundle branch block   Assessment and  Plan  VT-NS  Atrial fib  RVR  Atrial tachycardia-slow  Sinus bradycardia  Pacemaker /St Jude   Morbidly obese  OSA treated  HFpEF acute/chronic  Pulmonary fibrosis  The patient has dyspnea and oxygen levels into the 80s while he is walking around the house.  Some edema.  Surprisingly little orthopnea.  On examination he is volume overloaded and we will increase his diuretics from 40 daily to 40 twice daily every other day for 7 days.  He is to see Dr. Lenore Cordia next week.  I have asked his daughter to do some pulse oximetry at home walking without his mask to see what his numbers are.  Recently has been as low as 80.  He may well need home oxygen.  Have also recommended that he try to decrease his p.o. fluid intake by 20 to 30%  We have discussed the physiology of heart failure including the importance of salt restriction and fluid restriction and have reviewed sources of dietary salt and water.  He had seen Dr. Virgel Bouquet for evaluation of pulmonary fibrosis.  Unfortunately, it was a not very satisfying visit.  I will reach out to Dr. Silas Flood and let him know of their concerns about possible therapies and the potential role of home oxygen  He is holding sinus rhythm.  We will continue him on his dofetilide at 250 mcg twice daily.  He remains anticoagulated with warfarin.       Current medicines are reviewed at length with the patient today .  The patient does not  have concerns regarding medicines.   I,Stephanie  Williams,acting as a Education administrator for Virl Axe, MD.,have documented all relevant documentation on the behalf of Virl Axe, MD,as directed by  Virl Axe, MD while in the presence of Virl Axe, MD.  I, Virl Axe, MD, have reviewed all documentation for this visit. The documentation on 04/13/21 for the exam, diagnosis, procedures, and orders are all accurate and complete.

## 2021-04-13 NOTE — Patient Instructions (Addendum)
Medication Instructions:  Your physician has recommended you make the following change in your medication:   ** You will increae your Furosemide to 40 mg twice daily every other day x 4 days with 40mg  daily in between for a total of 7 days then return to your original dosing.  *If you need a refill on your cardiac medications before your next appointment, please call your pharmacy*   Lab Work: None ordered.  If you have labs (blood work) drawn today and your tests are completely normal, you will receive your results only by: Marland Kitchen MyChart Message (if you have MyChart) OR . A paper copy in the mail If you have any lab test that is abnormal or we need to change your treatment, we will call you to review the results.   Testing/Procedures: None ordered.    Follow-Up: At [facility], you and your health needs are our priority.  As part of our continuing mission to provide you with exceptional heart care, we have created designated Provider Care Teams.  These Care Teams include your primary Cardiologist (physician) and Advanced Practice Providers (APPs -  Physician Assistants and Nurse Practitioners) who all work together to provide you with the care you need, when you need it.  We recommend signing up for the patient portal called "MyChart".  Sign up information is provided on this After Visit Summary.  MyChart is used to connect with patients for Virtual Visits (Telemedicine).  Patients are able to view lab/test results, encounter notes, upcoming appointments, etc.  Non-urgent messages can be sent to your provider as well.   To learn more about what you can do with MyChart, go to [email or web address].    Your next appointment:   6 months with Dr Caryl Comes

## 2021-04-14 ENCOUNTER — Telehealth: Payer: Self-pay | Admitting: Internal Medicine

## 2021-04-14 ENCOUNTER — Encounter: Payer: Self-pay | Admitting: Cardiology

## 2021-04-14 NOTE — Telephone Encounter (Signed)
Spoke with daughter of patient, Curt Bears, in reference to call she received from [place] after their visit.  I let the daughter know I would send this message to Rosann Auerbach and let her know she was returning her call.  Daughter appreciative of return call.

## 2021-04-14 NOTE — Telephone Encounter (Signed)
Chad [name] is calling stating she is returning a call from [name] she received yesterday after leaving [name] appointment. She states she spoke with someone when calling back and they advised she was busy and would have her return the call, but they never heard back. Was unable to find documentation to support this, and advised her based on Epic Rosann Auerbach is out of office until tomorrow. Requested a message be routed to triage to see if this is something they can call her back in regards to if not she states she will wait until tomorrow when Rosann Auerbach returns. Please advise.

## 2021-04-14 NOTE — Telephone Encounter (Signed)
Error

## 2021-04-15 NOTE — Telephone Encounter (Signed)
Spoke with Curt Bears, pt's daughter and DPR and advised Dr Caryl Comes would like for pt to have CBC.  Pt is scheduled to see Dr Percival Spanish at the [place] office 04/22/2021 and recommend pt have CBC drawn at time of appointment with Dr Percival Spanish.  Notated this request in pt's appointment note.  Pt's daughter verbalizes understanding and states she will also make a note re: need for CBC and will let us know if pt has any problem obtaining lab.

## 2021-04-16 ENCOUNTER — Telehealth: Payer: Self-pay | Admitting: Pulmonary Disease

## 2021-04-16 NOTE — Telephone Encounter (Signed)
Called and spoke with patient's daughter. I gave her multiple dates and times for the days remaining in June. They did not work. She wanted to see when [place] will be in office in July 2022.   MH, have you given your July dates yet?

## 2021-04-16 NOTE — Telephone Encounter (Signed)
Contacted by cardiologist. Patient has not followed up, seems visit not scheduled earlier in Spring as I had planned. Will arrange f/u visit soon.

## 2021-04-21 DIAGNOSIS — R0602 Shortness of breath: Secondary | ICD-10-CM | POA: Insufficient documentation

## 2021-04-21 NOTE — Progress Notes (Signed)
Cardiology Office Note   Date:  04/22/2021   ID:  Chad [name], DOB 31-Dec-1928, MRN [id number]  PCP:  Redmond School, MD  Cardiologist:   Minus Breeding, MD   Chief Complaint  Patient presents with   Shortness of Breath       History of Present Illness: Chad [name] is a 87 y.o. male who presents for follow up of atrial fib.   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  He has been treated for SVT as well by Dr. Caryl Comes.   He saw Dr. Caryl Comes this fall and had a high resolution CT with possible usual interstitial pneumonitis.  He was referred to pulmonary.   He was supposed to be seen in follow-up with a follow-up CT but actually his pulmonologist moved and he has not been called back yet.  He saw Dr. Caryl Comes recently and he had some excess fluid.  He was given an extra 40 mg of Lasix every other day for 7 days.  He said prior to that he was a little short of breath and he now feels better.  His weights have been stable.  He does avoid salt although in close conversation sounds like he is eating a little bit more than I would like in the foods.  He gets around slowly with a cane.  He does have dyspnea on exertion but is not describing any PND or orthopnea.  Has had no new palpitations, presyncope or syncope.    Past Medical History:  Diagnosis Date   Bladder cancer ([place]) 2010   Chronic bronchitis ([place])    Hypertension    Hypoglycemia    On home oxygen therapy    "2L w/CPAP at night" (06/21/2017)   OSA on CPAP    "w/2L O2 at night" (06/21/2017)   Persistent atrial fibrillation ([place])        Pneumonia    "when he was young; again in 2016" (06/21/2017)   Presence of permanent cardiac pacemaker    Sensory ataxia     Past Surgical History:  Procedure Laterality Date   BACK SURGERY     CARDIOVERSION N/A 04/23/2015   Procedure: CARDIOVERSION;  Surgeon: [name], MD;  Location: [place];  Service: Cardiovascular;  Laterality: N/A;   CARDIOVERSION N/A  07/30/2015   Procedure: CARDIOVERSION;  Surgeon: [name], MD;  Location: [facility] ENDOSCOPY;  Service: Cardiovascular;  Laterality: N/A;   EYE SURGERY Bilateral 2009   "to lower pressure; no glaucoma" (06/21/2017)   INSERT / REPLACE / REMOVE PACEMAKER  06/21/2017   LUMBAR [place]   PACEMAKER IMPLANT N/A 06/21/2017   Procedure: Pacemaker Implant;  Surgeon: Deboraha Sprang, MD;  Location: [facility];  Service: Cardiovascular;  Laterality: N/A;   TRANSURETHRAL RESECTION OF BLADDER TUMOR WITH GYRUS (TURBT-GYRUS)  07/10/2009    Cystourethroscopy, gyrus TURBT (transurethral  resection of bladder tumor).[name] 03/16/2011     Current Outpatient Medications  Medication Sig Dispense Refill   ascorbic acid (VITAMIN C) 1000 MG tablet Take 1,000 mg by mouth daily.     Cholecalciferol (VITAMIN D3) 2000 units capsule Take 2,000 Units by mouth 2 (two) times daily.     Coenzyme Q10 200 MG TABS Take 200 mg by mouth daily.     dofetilide (TIKOSYN) 250 MCG capsule Take 1 capsule by mouth twice daily 180 capsule 2   escitalopram (LEXAPRO) 10 MG tablet Take 10 mg by mouth at bedtime.  furosemide (LASIX) 20 MG tablet Take 1 tablet by mouth twice daily 180 tablet 1   irbesartan (AVAPRO) 75 MG tablet Take 75 mg by mouth daily.     metoprolol succinate (TOPROL XL) 50 MG 24 hr tablet Take 75mg  by mouth twice a day 30 tablet 11   Multiple Vitamins-Minerals (CENTRUM SILVER PO) Take 1 tablet by mouth daily.     OXYGEN Inhale 2 L into the lungs at bedtime. Uses with CPAP machine     vitamin B-12 (CYANOCOBALAMIN) 500 MCG tablet Take 500 mcg by mouth 2 (two) times daily.     warfarin (COUMADIN) 5 MG tablet TAKE 1/2 TO 1 (ONE-HALF TO ONE) TABLET BY MOUTH ONCE DAILY AS DIRECTED BY COUMADIN CLINIC 90 tablet 1   ZINC GLUCONATE PO Take 1 tablet by mouth daily.     No current facility-administered medications for this visit.    Allergies:   Doxycycline, Amiodarone, Carbapenems, Cephalexin, Cephalosporins,  Chlorpheniramine, Ciprofloxacin, Decongestant [oxymetazoline], Diltiazem, Flagyl [metronidazole], Hctz [hydrochlorothiazide], Levaquin [levofloxacin in d5w], Levofloxacin, Lidocaine, Penicillins, Teline [tetracycline], Tetanus toxoid, Z-pak [azithromycin], Sulfonamide derivatives, and Toprol xl [metoprolol tartrate]    ROS:  Please see the history of present illness.   Otherwise, review of systems are positive for none.   All other systems are reviewed and negative.    PHYSICAL EXAM: VS:  BP 132/68 (BP Location: Left Arm, Patient Position: Sitting, Cuff Size: Normal)   Pulse 64   Ht 5' 9.5" (1.765 m)   Wt 247 lb 12.8 oz (112.4 kg)   SpO2 97%   BMI 36.07 kg/m  , BMI Body mass index is 36.07 kg/m. GEN:  No distress NECK:  No jugular venous distention at 90 degrees, waveform within normal limits, carotid upstroke brisk and symmetric, no bruits, no thyromegaly LYMPHATICS:  No cervical adenopathy LUNGS:  Clear to auscultation bilaterally BACK:  No CVA tenderness CHEST:  Unremarkable HEART:  S1 and S2 within normal limits, no S3, no S4, no clicks, no rubs, no murmurs ABD:  Positive bowel sounds normal in frequency in pitch, no bruits, no rebound, no guarding, unable to assess midline mass or bruit with the patient seated. EXT:  2 plus pulses throughout, mild right greater than left lower extremity edema, no cyanosis no clubbing SKIN:  No rashes no nodules NEURO:  Cranial nerves II through XII grossly intact, motor grossly intact throughout PSYCH:  Cognitively intact, oriented to person place and time   NGENERAL:  Well appearing NECK:  No jugular venous distention, waveform within normal limits, carotid upstroke brisk and symmetric, no bruits, no thyromegaly LUNGS:  Clear to auscultation bilaterally CHEST:  Unremarkable HEART:  PMI not displaced or sustained,S1 and S2 within normal limits, no S3, no S4, no clicks, no rubs, no murmurs ABD:  Flat, positive bowel sounds normal in frequency in  pitch, no bruits, no rebound, no guarding, no midline pulsatile mass, no hepatomegaly, no splenomegaly EXT:  2 plus pulses throughout, no edema, no cyanosis no clubbing  EKG: NA  Recent Labs: 07/06/2020: BNP 119.9 03/16/2021: BUN 16; Creatinine, Ser 1.31; Magnesium 2.5; Potassium 4.2; Sodium 137    Lipid Panel    Component Value Date/Time   CHOL 179 06/10/2010 1617   TRIG 75 06/10/2010 1617   HDL 46 06/10/2010 1617   LDLCALC 118 06/10/2010 1617      Wt Readings from Last 3 Encounters:  04/22/21 247 lb 12.8 oz (112.4 kg)  04/13/21 248 lb 12.8 oz (112.9 kg)  04/07/21 247 lb (112 kg)  Other studies Reviewed: Additional studies/ records that were reviewed today include: labs  Review of the above records demonstrates: See elsewhere   ASSESSMENT AND PLAN:   SOB:   This seems to be baseline.  We talked about as needed dosing of his diuretic.  We again talked about salt and fluid restriction.  We have a call into the pulmonary service to get him rescheduled for follow-up of this.  ATRIAL FIB:  Chad [name] has a CHA2DS2 - VASc score of 3.  He tolerates anticoagulation.  No change in therapy.  HTN:  The blood pressure is at target.  No change in therapy.   HFpEF:    Seems to be euvolemic.  No change in therapy.  SVT:    He has had no symptomatic recurrence.  No change in therapy.  SLEEP APNEA:   He has a new mask and close follow-up.  He feels much better.  No change in therapy.  BRADYCARDIA:   He has had recent follow-up with Dr. Caryl Comes and sees him again in 6 months.  Current medicines are reviewed at length with the patient today.  The patient does not have concerns regarding medicines.  The following changes have been made:   None  Labs/ tests ordered today include:     Orders Placed This Encounter  Procedures   Basic metabolic panel      Disposition:   FU with me in 12  months.     Signed, Minus Breeding, MD  04/22/2021 1:34 PM    [facility]

## 2021-04-22 ENCOUNTER — Ambulatory Visit: Payer: Medicare Other | Admitting: Cardiology

## 2021-04-22 ENCOUNTER — Other Ambulatory Visit: Payer: Self-pay | Admitting: *Deleted

## 2021-04-22 ENCOUNTER — Encounter: Payer: Self-pay | Admitting: Cardiology

## 2021-04-22 ENCOUNTER — Other Ambulatory Visit: Payer: Self-pay

## 2021-04-22 VITALS — BP 132/68 | HR 64 | Ht 69.5 in | Wt 247.8 lb

## 2021-04-22 DIAGNOSIS — I495 Sick sinus syndrome: Secondary | ICD-10-CM | POA: Diagnosis not present

## 2021-04-22 DIAGNOSIS — I4819 Other persistent atrial fibrillation: Secondary | ICD-10-CM

## 2021-04-22 DIAGNOSIS — Z950 Presence of cardiac pacemaker: Secondary | ICD-10-CM

## 2021-04-22 DIAGNOSIS — I480 Paroxysmal atrial fibrillation: Secondary | ICD-10-CM | POA: Diagnosis not present

## 2021-04-22 DIAGNOSIS — R0602 Shortness of breath: Secondary | ICD-10-CM

## 2021-04-22 DIAGNOSIS — I10 Essential (primary) hypertension: Secondary | ICD-10-CM

## 2021-04-22 DIAGNOSIS — I471 Supraventricular tachycardia: Secondary | ICD-10-CM | POA: Diagnosis not present

## 2021-04-22 DIAGNOSIS — R001 Bradycardia, unspecified: Secondary | ICD-10-CM | POA: Diagnosis not present

## 2021-04-22 LAB — CBC
Hematocrit: 46.6 % (ref 37.5–51.0)
Hemoglobin: 15.8 g/dL (ref 13.0–17.7)
MCH: 30 pg (ref 26.6–33.0)
MCHC: 33.9 g/dL (ref 31.5–35.7)
MCV: 89 fL (ref 79–97)
Platelets: 187 10*3/uL (ref 150–450)
RBC: 5.26 x10E6/uL (ref 4.14–5.80)
RDW: 13.8 % (ref 11.6–15.4)
WBC: 5.8 10*3/uL (ref 3.4–10.8)

## 2021-04-22 NOTE — Patient Instructions (Signed)
Medication Instructions:  Your physician recommends that you continue on your current medications as directed. Please refer to the Current Medication list given to you today.   *If you need a refill on your cardiac medications before your next appointment, please call your pharmacy*  Lab Work: NONE  Testing/Procedures: NONE  Follow-Up: At [facility], you and your health needs are our priority.  As part of our continuing mission to provide you with exceptional heart care, we have created designated Provider Care Teams.  These Care Teams include your primary Cardiologist (physician) and Advanced Practice Providers (APPs -  Physician Assistants and Nurse Practitioners) who all work together to provide you with the care you need, when you need it.  We recommend signing up for the patient portal called "MyChart".  Sign up information is provided on this After Visit Summary.  MyChart is used to connect with patients for Virtual Visits (Telemedicine).  Patients are able to view lab/test results, encounter notes, upcoming appointments, etc.  Non-urgent messages can be sent to your provider as well.   To learn more about what you can do with MyChart, go to [email or web address].    Your next appointment:   12 month(s)  The format for your next appointment:   In Person  Provider:   You may see James Hochrein, MD   or one of the following Advanced Practice Providers on your designated Care Team:    Rhonda Barrett, PA-C  Kathryn Lawrence, DNP, ANP    

## 2021-04-23 LAB — BASIC METABOLIC PANEL
BUN/Creatinine Ratio: 13 (ref 10–24)
BUN: 15 mg/dL (ref 10–36)
CO2: 27 mmol/L (ref 20–29)
Calcium: 9.2 mg/dL (ref 8.6–10.2)
Chloride: 104 mmol/L (ref 96–106)
Creatinine, Ser: 1.16 mg/dL (ref 0.76–1.27)
Glucose: 98 mg/dL (ref 65–99)
Potassium: 4.8 mmol/L (ref 3.5–5.2)
Sodium: 145 mmol/L — ABNORMAL HIGH (ref 134–144)
eGFR: 60 mL/min/{1.73_m2} (ref >59)

## 2021-04-27 DIAGNOSIS — G4731 Primary central sleep apnea: Secondary | ICD-10-CM | POA: Diagnosis not present

## 2021-04-27 DIAGNOSIS — G4733 Obstructive sleep apnea (adult) (pediatric): Secondary | ICD-10-CM | POA: Diagnosis not present

## 2021-04-28 ENCOUNTER — Telehealth: Payer: Self-pay

## 2021-04-28 NOTE — Telephone Encounter (Signed)
-----   Message from Chad Sprang, MD sent at 04/24/2021 12:34 PM EDT ----- Please Inform Patient that labs are normal  Thanks

## 2021-04-28 NOTE — Telephone Encounter (Signed)
Attempted phone call to pt.  Per Epic OK to leave detailed voicemail message.  Advised per Dr Caryl Comes, labs reviewed and are normal.  Please contact office at [phone] for questions or concerns.

## 2021-05-03 ENCOUNTER — Ambulatory Visit (INDEPENDENT_AMBULATORY_CARE_PROVIDER_SITE_OTHER): Payer: Medicare Other

## 2021-05-03 DIAGNOSIS — I495 Sick sinus syndrome: Secondary | ICD-10-CM

## 2021-05-03 LAB — CUP PACEART REMOTE DEVICE CHECK
Battery Remaining Longevity: 72 mo
Battery Remaining Percentage: 64 %
Battery Voltage: 2.99 V
Brady Statistic AP VP Percent: 1 %
Brady Statistic AP VS Percent: 73 %
Brady Statistic AS VP Percent: 1 %
Brady Statistic AS VS Percent: 24 %
Brady Statistic RA Percent Paced: 69 %
Brady Statistic RV Percent Paced: 1.5 %
Date Time Interrogation Session: 20220620032102
Implantable Lead Implant Date: 20180808
Implantable Lead Implant Date: 20180808
Implantable Lead Location: 753859
Implantable Lead Location: 753860
Implantable Lead Model: 5076
Implantable Lead Model: 5076
Implantable Pulse Generator Implant Date: 20180808
Lead Channel Impedance Value: 450 Ohm
Lead Channel Impedance Value: 450 Ohm
Lead Channel Pacing Threshold Amplitude: 0.75 V
Lead Channel Pacing Threshold Amplitude: 1 V
Lead Channel Pacing Threshold Pulse Width: 0.5 ms
Lead Channel Pacing Threshold Pulse Width: 0.5 ms
Lead Channel Sensing Intrinsic Amplitude: 12 mV
Lead Channel Sensing Intrinsic Amplitude: 2.7 mV
Lead Channel Setting Pacing Amplitude: 2 V
Lead Channel Setting Pacing Amplitude: 2.5 V
Lead Channel Setting Pacing Pulse Width: 0.5 ms
Lead Channel Setting Sensing Sensitivity: 2 mV
Pulse Gen Model: 2272
Pulse Gen Serial Number: 8932564

## 2021-05-12 ENCOUNTER — Ambulatory Visit (INDEPENDENT_AMBULATORY_CARE_PROVIDER_SITE_OTHER): Payer: Medicare Other | Admitting: *Deleted

## 2021-05-12 DIAGNOSIS — Z5181 Encounter for therapeutic drug level monitoring: Secondary | ICD-10-CM

## 2021-05-12 DIAGNOSIS — I4819 Other persistent atrial fibrillation: Secondary | ICD-10-CM

## 2021-05-12 LAB — POCT INR: INR: 3.2 — AB (ref 2.0–3.0)

## 2021-05-12 NOTE — Patient Instructions (Signed)
Hold warfarin tonight then resume 1/2 tablet daily except 1 tablet on Thursdays Recheck in 6 weeks

## 2021-05-13 DIAGNOSIS — Z8551 Personal history of malignant neoplasm of bladder: Secondary | ICD-10-CM | POA: Diagnosis not present

## 2021-05-13 DIAGNOSIS — N281 Cyst of kidney, acquired: Secondary | ICD-10-CM | POA: Diagnosis not present

## 2021-05-13 DIAGNOSIS — D074 Carcinoma in situ of penis: Secondary | ICD-10-CM | POA: Diagnosis not present

## 2021-05-14 ENCOUNTER — Ambulatory Visit: Payer: Medicare Other | Admitting: Internal Medicine

## 2021-05-21 NOTE — Progress Notes (Signed)
Remote pacemaker transmission.   

## 2021-05-24 DIAGNOSIS — L0109 Other impetigo: Secondary | ICD-10-CM | POA: Diagnosis not present

## 2021-05-25 ENCOUNTER — Other Ambulatory Visit: Payer: Self-pay | Admitting: Internal Medicine

## 2021-06-07 ENCOUNTER — Other Ambulatory Visit: Payer: Self-pay | Admitting: Dermatology

## 2021-06-24 ENCOUNTER — Ambulatory Visit (INDEPENDENT_AMBULATORY_CARE_PROVIDER_SITE_OTHER): Payer: Medicare Other | Admitting: *Deleted

## 2021-06-24 DIAGNOSIS — Z5181 Encounter for therapeutic drug level monitoring: Secondary | ICD-10-CM

## 2021-06-24 DIAGNOSIS — I4819 Other persistent atrial fibrillation: Secondary | ICD-10-CM

## 2021-06-24 LAB — POCT INR: INR: 2.6 (ref 2.0–3.0)

## 2021-06-24 NOTE — Patient Instructions (Signed)
Continue warfarin 1/2 tablet daily except 1 tablet on Thursdays Recheck in 6 weeks 

## 2021-06-27 ENCOUNTER — Other Ambulatory Visit: Payer: Self-pay | Admitting: Cardiology

## 2021-07-13 ENCOUNTER — Ambulatory Visit: Payer: Medicare Other | Admitting: Podiatry

## 2021-07-13 ENCOUNTER — Other Ambulatory Visit: Payer: Self-pay

## 2021-07-13 ENCOUNTER — Encounter: Payer: Self-pay | Admitting: Podiatry

## 2021-07-13 DIAGNOSIS — M79675 Pain in left toe(s): Secondary | ICD-10-CM | POA: Diagnosis not present

## 2021-07-13 DIAGNOSIS — Z7901 Long term (current) use of anticoagulants: Secondary | ICD-10-CM | POA: Diagnosis not present

## 2021-07-13 DIAGNOSIS — M79674 Pain in right toe(s): Secondary | ICD-10-CM

## 2021-07-13 DIAGNOSIS — B351 Tinea unguium: Secondary | ICD-10-CM | POA: Diagnosis not present

## 2021-07-13 NOTE — Progress Notes (Signed)
Subjective: 87 y.o. returns the office today for painful, elongated, thickened toenails which they cannot trim themself.  He is using coconut oil and states this is been more helpful and the color of the nails were looking better.  No other concerns today.    He is on Coumadin  PCP: [name], MD  Objective: AAO 3, NAD DP/PT pulses palpable, CRT less than 3 seconds Nails hypertrophic, dystrophic, elongated, brittle, discolored 10.  Particular the left hallux nail appears to be improved.  There is still yellow-brown discoloration to the toenail sites. No open lesions or pre-ulcerative lesions are identified. No pain with calf compression, swelling, warmth, erythema.  Assessment: Patient presents with symptomatic onychomycosis; chronic anticoagulation  Plan: -Treatment options including alternatives, risks, complications were discussed -Nails sharply debrided 10 without complication/bleeding. -He continues coconut oil as this has been helpful. -Discussed daily foot inspection. If there are any changes, to call the office immediately.  -Follow-up in 3 months or sooner if any problems are to arise. In the meantime, encouraged to call the office with any questions, concerns, changes symptoms.  Celesta Gentile, DPM

## 2021-07-14 ENCOUNTER — Other Ambulatory Visit: Payer: Self-pay | Admitting: Internal Medicine

## 2021-07-14 ENCOUNTER — Ambulatory Visit: Payer: Medicare Other | Admitting: Family Medicine

## 2021-07-14 VITALS — BP 147/74 | HR 76 | Ht 69.0 in | Wt 246.0 lb

## 2021-07-14 DIAGNOSIS — Z789 Other specified health status: Secondary | ICD-10-CM | POA: Diagnosis not present

## 2021-07-14 NOTE — Progress Notes (Signed)
PATIENT: Chad [name] DOB: [birth year]  REASON FOR VISIT: follow up HISTORY FROM: patient  Chief Complaint  Patient presents with   Obstructive Sleep Apnea    Pt states he is doing well, states there is too much heat and not enough moisture      HISTORY OF PRESENT ILLNESS: 07/14/21 ALL: Chad [name] is a 87 y.o. male here today for follow up for OSA on ASV. He was last seen by Chad [name] 03/2021. She recommended he switch back to nasal pillow with chin strap and made adjustments to pressure settings. Since, he feels that he is doing great. He likes the nasal pillow. He has not noticed any leaking from mask or mouth. He is tolerating chin strap. He reports that humidity level will not adjust below 71% and he does not like the heated tubing. He states that the water chamber is always empty when he wakes.     HISTORY: (copied from Chad Dohmeier's previous note)  Chad [name] is a 87year old  Caucasian male patient and is seen here upon a RV on 04-07-2021: Daughter of this patient is on the phone- we are discussing the results of his last sleep study and download.  Chad [name] reports that he feels his current pressure is too much and he has multiple very high air leakage creating [name] artificially high AHI.  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  He uses 2 L of oxygen by the time he was presenting for a reevaluation and had a new diagnosis of pulmonary fibrosis.  CPAP was initiated at night on 7 February at 8 then 10 and finally 12 cmH2O but did not eliminate apnea or hypoxemia.  The patient stated that he did not use a chinstrap here in the lab while he has been using 1 at home this may be one of the explanations why he has such high air leakage.  Central apnea emerged on the CPAP so BiPAP  was attempted and still had the same result at 13/9 and at 14/10 centimeters water pressure.  Finally the technologist Chad [name], switch the patient to ASV and he used 15 cm maximum and 10 cm minimum pressure and 4 cm deep the patient was the patient was able to reach [name] AHI of only 0.5/h.  There was also some evidence of REM sleep behavior disorder during the night.  He used a full facemask ResMed 30 and medium size as fitted by the technologist at night as he states now he feels this is too much pressure and I would support changing him back to his old mask and chinstrap in order to keep a more comfortable set up.  I also am willing to reduce the ASV pressure because it is entirely possible that only the full facemask made it tolerable.   referral from Chad. Percival Spanish on  04/07/2021 . Chief concern according to patient :  the patient reports increasing shortness of breath, sudden onset in 06-2020. Patient felt too tired on high beta blockers, is on Tikosyn and on anticoagulation on Warfarin. He was diagnosed with pulmonary fibrosis by Chad Caryl Comes in 09-2020. He has a pacemaker. He is in chronic pain. Pain managing deferred as he is anticoagulated.  He uses 2 liters oxygen.  I have the pleasure of seeing Chad [name] today, a right-handed Caucasian male with a known sleep disorder.  He  has a past medical history of Bladder cancer ([place]) (2010), Chronic bronchitis ([place]), Hypertension, Hypoglycemia, On home oxygen therapy, on OSA on CPAP, Persistent atrial fibrillation ([place]), Pneumonia, Presence of permanent cardiac pacemaker, and DDD and spinal degeneration, chronic pain, and ( NS Chad. Quentin Cornwall )  ataxia..  The patient had the first sleep study in the year 2014 at [place] [facility]- with a result of [name] AHI ( Apnea Hypopnea index)  Of 63.8/h , and low oxygen saturation for 20 minutes  Nadir at SP02 80%. Many PLMs attributed to spinal stenosis. Atrial fib.   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  Worked night shifts all his career. Family status is widowed with adult children, was married 32 years. Wife died 09-12-19. He had 6 sisters and still has 6 siblings living, older brother and older sister. One sister died of COVID.  Tobacco use- history of smoking, quit 46 year ago.  ETOH use; 3 times a week,  Caffeine intake in form of Coffee( /) Soda( /) Tea ( /) some dark chocolate. Regular exercise in form of walking , limited.   Hobbies : 1991, Special educational needs teacher, genealogy.    Sleep habits are as follows: The patient's dinner time is between 6-7  PM. Snacks at 11 PM.  The patient goes to bed at 2-3 AM and continues to sleep for many hours,often 3-4  hours, wakes for one-ywo bathroom breaks.   The preferred sleep position is supine due to CPAP- and O2.  , with the support of 1-2 pillows. Dreams are reportedly very frequent/vivid.  11-noon AM is the usual rise time. The patient wakes up spontaneously.  He  reports not feeling refreshed or restored in AM, with symptoms such as dry mouth, and stiffness, residual fatigue.  Naps are taken infrequently, but he dozes off- on his couch . These are power naps.     REVIEW OF SYSTEMS: Out of a complete 14 system review of symptoms, the patient complains only of the following symptoms, arthritis and all other reviewed systems are negative.   ALLERGIES: Allergies  Allergen Reactions   Doxycycline Hives   Amiodarone     Caused neuropathy   Carbapenems    Cephalexin Other (See Comments)   Cephalosporins Other (See Comments)   Chlorpheniramine Other (See Comments)    Swells prostate Swells prostate   Ciprofloxacin     Pt can not take with amiodarone    Decongestant [Oxymetazoline]     All decongestant - makes prostate swell   Diltiazem Swelling    Left leg swelling   Flagyl [Metronidazole] Nausea Only    Stomach ache   Hctz [Hydrochlorothiazide]    Levaquin  [Levofloxacin In D5w] Other (See Comments)    unknown   Levofloxacin Nausea Only   Lidocaine     Pt unsure of   Penicillins Swelling    Has patient had a PCN reaction causing immediate rash, facial/tongue/throat swelling, SOB or lightheadedness with hypotension: No Has patient had a PCN reaction causing severe rash involving mucus membranes or skin necrosis: No Has patient had a PCN reaction that required hospitalization: No Has patient had a PCN reaction occurring within the last 10 years: No If all of the above answers are "NO", then may proceed with Cephalosporin use.  Swelling at injection site   Teline [Tetracycline] Swelling    Hospitalization for 2 weeks, prostate swelling   Tetanus Toxoid Swelling    Swells arms, Please pre-medicate with benadryl.   Z-Pak [Azithromycin] Other (See Comments)    Pt can not take with Amiodarone    Sulfonamide Derivatives Rash   Toprol Xl [Metoprolol Tartrate] Itching and Rash    Pt states he tolerates.    HOME MEDICATIONS: Outpatient Medications Prior to Visit  Medication Sig Dispense Refill   ascorbic acid (VITAMIN C) 1000 MG tablet Take 1,000 mg by mouth daily.     Cholecalciferol (VITAMIN D3) 2000 units capsule Take 2,000 Units by mouth 2 (two) times daily.     Coenzyme Q10 200 MG TABS Take 200 mg by mouth daily.     dofetilide (TIKOSYN) 250 MCG capsule Take 1 capsule by mouth twice daily 180 capsule 2   escitalopram (LEXAPRO) 10 MG tablet Take 10 mg by mouth at bedtime.     furosemide (LASIX) 20 MG tablet Take 1 tablet by mouth twice daily 180 tablet 1   irbesartan (AVAPRO) 75 MG tablet Take 75 mg by mouth daily.     metoprolol succinate (TOPROL XL) 50 MG 24 hr tablet Take '75mg'$  by mouth twice a day 30 tablet 11   Multiple Vitamins-Minerals (CENTRUM SILVER PO) Take 1 tablet by mouth daily.     mupirocin ointment (BACTROBAN) 2 % Apply topically 2 (two) times daily.     OXYGEN Inhale 2 L into the lungs at bedtime. Uses with CPAP machine      triamcinolone ointment (KENALOG) 0.1 % Apply 1 application topically 2 (two) times daily.     vitamin B-12 (CYANOCOBALAMIN) 500 MCG tablet Take 500 mcg by mouth 2 (two) times daily.     warfarin (COUMADIN) 5 MG tablet TAKE 1/2 TO 1 (ONE-HALF TO ONE) TABLET BY MOUTH ONCE DAILY AS DIRECTED BY  THE  COUMADIN  CLINIC 90 tablet 0   ZINC GLUCONATE PO Take 1 tablet by mouth daily.     No facility-administered medications prior to visit.    PAST MEDICAL HISTORY: Past Medical History:  Diagnosis Date   Bladder cancer ([place]) 2010   Chronic bronchitis ([place])    Hypertension    Hypoglycemia    On home oxygen therapy    "2L w/CPAP at night" (06/21/2017)   OSA on CPAP    "w/2L O2 at night" (06/21/2017)   Persistent atrial fibrillation ([place])        Pneumonia    "when he was young; again in 2016" (06/21/2017)   Presence of permanent cardiac pacemaker    Sensory ataxia     PAST SURGICAL HISTORY: Past Surgical History:  Procedure Laterality Date   BACK SURGERY     CARDIOVERSION N/A 04/23/2015   Procedure: CARDIOVERSION;  Surgeon: [name], MD;  Location: [facility];  Service: Cardiovascular;  Laterality: N/A;   CARDIOVERSION N/A 07/30/2015   Procedure: CARDIOVERSION;  Surgeon: [name], MD;  Location[facility];  Service: Cardiovascular;  Laterality: N/A;   EYE SURGERY Bilateral 2009   "to lower pressure; no glaucoma" (06/21/2017)   INSERT / REPLACE / REMOVE PACEMAKER  06/21/2017   LUMBAR Alpha   PACEMAKER IMPLANT N/A 06/21/2017   Procedure: Pacemaker Implant;  Surgeon: Deboraha Sprang, MD;  Location: [facility];  Service: Cardiovascular;  Laterality: N/A;   TRANSURETHRAL RESECTION OF BLADDER TUMOR WITH GYRUS (TURBT-GYRUS)  07/10/2009    Cystourethroscopy,  gyrus TURBT (transurethral  resection of bladder tumor).[name] 03/16/2011    FAMILY HISTORY: Family History  Problem Relation Age of Onset   Heart failure Father    Hypertension Father    Dementia Mother      SOCIAL HISTORY: Social History   Socioeconomic History   Marital status: Married    Spouse name: [name]   Number of children: 2   Years of education: 16   Highest education level: Not on file  Occupational History    Employer: RETIRED  Tobacco Use   Smoking status: Former    Packs/day: 0.50    Years: 20.00    Pack years: 10.00    Types: Cigarettes    Quit date: 06/03/1971    Years since quitting: 50.1   Smokeless tobacco: Never  Vaping Use   Vaping Use: Never used  Substance and Sexual Activity   Alcohol use: No   Drug use: No   Sexual activity: Not on file  Other Topics Concern   Not on file  Social History Narrative   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   Patient has two children.   Patient is retired.   Patient has a college education.   Patient is right-handed.   Patient does not drink any caffeine.   Social Determinants of Health   Financial Resource Strain: Not on file  Food Insecurity: Not on file  Transportation Needs: Not on file  Physical Activity: Not on file  Stress: Not on file  Social Connections: Not on file  Intimate Partner Violence: Not on file     PHYSICAL EXAM  Vitals:   07/14/21 1450  BP: (!) 147/74  Pulse: 76  Weight: 246 lb (111.6 kg)  Height: '5\' 9"'$  (1.753 m)   Body mass index is 36.33 kg/m.  Generalized: Well developed, in no acute distress  Cardiology: normal rate and rhythm, no murmur noted Respiratory: clear to auscultation bilaterally  Neurological examination  Mentation: Alert oriented to time, place, history taking. Follows all commands speech and language fluent Cranial nerve II-XII: Pupils were equal round reactive to light. Extraocular movements were full, visual field were full  Motor: The motor testing reveals 5 over 5 strength of all 4 extremities. Good symmetric motor tone is noted throughout.  Gait and station: arthritic gait, stable with four prong cane    DIAGNOSTIC DATA (LABS,  IMAGING, TESTING) - I reviewed patient records, labs, notes, testing and imaging myself where available.  No flowsheet data found.   Lab Results  Component Value Date   WBC 5.8 04/22/2021   HGB 15.8 04/22/2021   HCT 46.6 04/22/2021   MCV 89 04/22/2021   PLT 187 04/22/2021      Component Value Date/Time   NA 145 (H) 04/22/2021 1423   K 4.8 04/22/2021 1423   CL 104 04/22/2021 1423   CO2 27 04/22/2021 1423   GLUCOSE 98 04/22/2021 1423   GLUCOSE 143 (H) 03/16/2021 1000   BUN 15 04/22/2021 1423   CREATININE 1.16 04/22/2021 1423   CREATININE 1.16 (H) 06/12/2017 1635   CALCIUM 9.2 04/22/2021 1423   PROT 6.4 (L) 01/18/2019 1352   ALBUMIN 3.3 (L) 01/18/2019 1352   AST 26 01/18/2019 1352   ALT 16 01/18/2019 1352   ALKPHOS 78 01/18/2019 1352   BILITOT 0.5 01/18/2019 1352   GFRNONAA 52 (L) 03/16/2021 1000   GFRAA 54 (L) 08/04/2020 1626   Lab Results  Component Value Date   CHOL 179 06/10/2010  HDL 46 06/10/2010   LDLCALC 118 06/10/2010   TRIG 75 06/10/2010   No results found for: HGBA1C No results found for: VITAMINB12 Lab Results  Component Value Date   TSH 2.062 04/17/2015     ASSESSMENT AND PLAN 87 y.o. year old male  has a past medical history of Bladder cancer ([place]) (2010), Chronic bronchitis ([place]), Hypertension, Hypoglycemia, On home oxygen therapy, OSA on CPAP, Persistent atrial fibrillation ([place]), Pneumonia, Presence of permanent cardiac pacemaker, and Sensory ataxia. here with     ICD-10-CM   1. Difficulty with adaptive servo-ventilation (ASV) use  Z78.9 For home use only DME [facility]       Chad [name] contnues to have a large air leak with ASV therapy. It has improved significant from >100l/min, now 56l/min. AHI was 22/hr, now 8.8/hr. Compliance report reveals excellent daily and acceptable four hour compliance. We have adjusted humidity level to 2 (from 4) on [place]. I have ordered mask refitting and reeducation regarding humidity settings at home. He was  encouraged to continue using ASV nightly and for greater than 4 hours each night. I will recheck report to assess leak in 3 months. Risks of untreated sleep apnea review and education materials provided. Healthy lifestyle habits encouraged. He will follow up in 6 months, sooner if needed. He verbalizes understanding and agreement with this plan.    Orders Placed This Encounter  Procedures   For home use only DME [facility]    Mask refitting due to severe mask leak, has improved with nasal pillow and chin strap but patient complains of water running out of chamber with leak > 50l/min    Order Specific Question:   Length of Need    Answer:   Lifetime    Order Specific Question:   Inspiratory pressure    Answer:   OTHER SEE COMMENTS    Order Specific Question:   Expiratory pressure    Answer:   OTHER SEE COMMENTS      No orders of the defined types were placed in this encounter.       Debbora Presto, FNP-C 07/14/2021, 3:40 PM [facility] [address], [place], [place] [address] [phone]

## 2021-07-14 NOTE — Patient Instructions (Addendum)
Please continue using your ASV regularly. While your insurance requires that you use ASV at least 4 hours each night on 70% of the nights, I recommend, that you not skip any nights and use it throughout the night if you can. Getting used to ASV and staying with the treatment long term does take time and patience and discipline. Untreated obstructive sleep apnea when it is moderate to severe can have an adverse impact on cardiovascular health and raise her risk for heart disease, arrhythmias, hypertension, congestive heart failure, stroke and diabetes. Untreated obstructive sleep apnea causes sleep disruption, nonrestorative sleep, and sleep deprivation. This can have an impact on your day to day functioning and cause daytime sleepiness and impairment of cognitive function, memory loss, mood disturbance, and problems focussing. Using ASVregularly can improve these symptoms.   Please continue monitoring mask fit and make sure you do not feel air leaking from nasal pillow or from the mouth. I am going to ask [facility] to look at the fit of your mask and the humidity. You should hear from them in the next few days.   Follow up with me in 6 months

## 2021-08-04 DIAGNOSIS — L309 Dermatitis, unspecified: Secondary | ICD-10-CM | POA: Diagnosis not present

## 2021-08-04 DIAGNOSIS — D692 Other nonthrombocytopenic purpura: Secondary | ICD-10-CM | POA: Diagnosis not present

## 2021-08-05 ENCOUNTER — Other Ambulatory Visit: Payer: Self-pay

## 2021-08-05 ENCOUNTER — Ambulatory Visit: Payer: Medicare Other | Admitting: *Deleted

## 2021-08-05 DIAGNOSIS — I4819 Other persistent atrial fibrillation: Secondary | ICD-10-CM

## 2021-08-05 DIAGNOSIS — Z5181 Encounter for therapeutic drug level monitoring: Secondary | ICD-10-CM

## 2021-08-05 LAB — POCT INR: INR: 2.6 (ref 2.0–3.0)

## 2021-08-05 NOTE — Patient Instructions (Signed)
Continue warfarin 1/2 tablet daily except 1 tablet on Thursdays Recheck in 6 weeks 

## 2021-08-09 DIAGNOSIS — H26491 Other secondary cataract, right eye: Secondary | ICD-10-CM | POA: Diagnosis not present

## 2021-08-09 DIAGNOSIS — H40013 Open angle with borderline findings, low risk, bilateral: Secondary | ICD-10-CM | POA: Diagnosis not present

## 2021-08-09 DIAGNOSIS — Z961 Presence of intraocular lens: Secondary | ICD-10-CM | POA: Diagnosis not present

## 2021-08-23 ENCOUNTER — Ambulatory Visit (INDEPENDENT_AMBULATORY_CARE_PROVIDER_SITE_OTHER): Payer: Medicare Other

## 2021-08-23 DIAGNOSIS — I495 Sick sinus syndrome: Secondary | ICD-10-CM

## 2021-08-25 LAB — CUP PACEART REMOTE DEVICE CHECK
Battery Remaining Longevity: 68 mo
Battery Remaining Percentage: 61 %
Battery Voltage: 2.99 V
Brady Statistic AP VP Percent: 1.8 %
Brady Statistic AP VS Percent: 77 %
Brady Statistic AS VP Percent: 1 %
Brady Statistic AS VS Percent: 20 %
Brady Statistic RA Percent Paced: 74 %
Brady Statistic RV Percent Paced: 2.4 %
Date Time Interrogation Session: 20221010081049
Implantable Lead Implant Date: 20180808
Implantable Lead Implant Date: 20180808
Implantable Lead Location: 753859
Implantable Lead Location: 753860
Implantable Lead Model: 5076
Implantable Lead Model: 5076
Implantable Pulse Generator Implant Date: 20180808
Lead Channel Impedance Value: 450 Ohm
Lead Channel Impedance Value: 450 Ohm
Lead Channel Pacing Threshold Amplitude: 0.75 V
Lead Channel Pacing Threshold Amplitude: 1 V
Lead Channel Pacing Threshold Pulse Width: 0.5 ms
Lead Channel Pacing Threshold Pulse Width: 0.5 ms
Lead Channel Sensing Intrinsic Amplitude: 12 mV
Lead Channel Sensing Intrinsic Amplitude: 2.7 mV
Lead Channel Setting Pacing Amplitude: 2 V
Lead Channel Setting Pacing Amplitude: 2.5 V
Lead Channel Setting Pacing Pulse Width: 0.5 ms
Lead Channel Setting Sensing Sensitivity: 2 mV
Pulse Gen Model: 2272
Pulse Gen Serial Number: 8932564

## 2021-08-31 NOTE — Progress Notes (Signed)
Remote pacemaker transmission.   

## 2021-09-02 DIAGNOSIS — N281 Cyst of kidney, acquired: Secondary | ICD-10-CM | POA: Diagnosis not present

## 2021-09-13 ENCOUNTER — Telehealth: Payer: Self-pay

## 2021-09-13 NOTE — Telephone Encounter (Signed)
The patient states he been experiencing SOB. It happens when he walk around. He would like someone to check his transmission. I tried to assist the patient with sending the transmission but received an error. I call merlin on conference call to get additional help with his monitor.

## 2021-09-13 NOTE — Telephone Encounter (Signed)
Spoke with pt, he reports about 2-3 days he noticed increased SOB, especially when he exerts himself.  He thought he might be out of rhythm.  Pt denies any new cough or fever.  He also denies any other cardiac symptoms.    Manual transmission reviewed, presenting rate Sensed 80-100s. He has not had any sustained atrial arrhythmias lately.  AF burden <1%.    Pt then stated that he has been having trouble with nose bleeds for the last 3 days as well.  States he currently has one now.    Pt to contact PCP to determine if something else if going on.

## 2021-09-15 ENCOUNTER — Telehealth: Payer: Self-pay | Admitting: Internal Medicine

## 2021-09-15 NOTE — Telephone Encounter (Signed)
Patient's daughter is requesting a referral to a new pulmonologist. She states they were not pleased with the last pulmonologist who saw the patient.

## 2021-09-15 NOTE — Telephone Encounter (Signed)
Attempted phone call to pt and left voicemail message to contact RN at [phone]. 

## 2021-09-16 ENCOUNTER — Ambulatory Visit (INDEPENDENT_AMBULATORY_CARE_PROVIDER_SITE_OTHER): Payer: Medicare Other | Admitting: *Deleted

## 2021-09-16 DIAGNOSIS — Z5181 Encounter for therapeutic drug level monitoring: Secondary | ICD-10-CM | POA: Diagnosis not present

## 2021-09-16 DIAGNOSIS — I4819 Other persistent atrial fibrillation: Secondary | ICD-10-CM

## 2021-09-16 DIAGNOSIS — Z23 Encounter for immunization: Secondary | ICD-10-CM | POA: Diagnosis not present

## 2021-09-16 LAB — POCT INR: INR: 2.6 (ref 2.0–3.0)

## 2021-09-16 NOTE — Patient Instructions (Addendum)
Continue warfarin 1/2 tablet daily except 1 tablet on Thursdays Recheck in 6 weeks 

## 2021-09-29 ENCOUNTER — Other Ambulatory Visit: Payer: Self-pay | Admitting: Cardiology

## 2021-10-12 DIAGNOSIS — L0109 Other impetigo: Secondary | ICD-10-CM | POA: Diagnosis not present

## 2021-10-12 DIAGNOSIS — L820 Inflamed seborrheic keratosis: Secondary | ICD-10-CM | POA: Diagnosis not present

## 2021-10-12 DIAGNOSIS — L814 Other melanin hyperpigmentation: Secondary | ICD-10-CM | POA: Diagnosis not present

## 2021-10-12 DIAGNOSIS — D1801 Hemangioma of skin and subcutaneous tissue: Secondary | ICD-10-CM | POA: Diagnosis not present

## 2021-10-14 ENCOUNTER — Ambulatory Visit: Payer: Medicare Other | Admitting: Podiatry

## 2021-10-14 ENCOUNTER — Telehealth: Payer: Self-pay | Admitting: Family Medicine

## 2021-10-14 ENCOUNTER — Other Ambulatory Visit: Payer: Self-pay

## 2021-10-14 DIAGNOSIS — Z7901 Long term (current) use of anticoagulants: Secondary | ICD-10-CM

## 2021-10-14 DIAGNOSIS — M79675 Pain in left toe(s): Secondary | ICD-10-CM

## 2021-10-14 DIAGNOSIS — M79674 Pain in right toe(s): Secondary | ICD-10-CM

## 2021-10-14 DIAGNOSIS — B351 Tinea unguium: Secondary | ICD-10-CM | POA: Diagnosis not present

## 2021-10-14 NOTE — Telephone Encounter (Signed)
Please make sure he was able to work with DME for mask fit. His leak is better! His AHI is still a little elevated but I am hopeful this will improve with time and can be registering a little higher due to the leak. If there is anything he needs, please let me know.

## 2021-10-14 NOTE — Progress Notes (Signed)
Subjective: 87 y.o. returns the office today for painful, elongated, thickened toenails which he  cannot trim himself.  States he is no longer concerned about the color of the toenails.  No swelling redness or injury to the toenail sites.  No other concerns today.    He is on Coumadin  PCP: [name], MD  Objective: AAO 3, NAD DP/PT pulses palpable, CRT less than 3 seconds Nails hypertrophic, dystrophic, elongated, brittle, discolored 10.  Nails still are discolored yellow-brown discoloration with subungual debris.  No edema, erythema or signs of infection of the toenail sites. No open lesions or pre-ulcerative lesions are identified. No pain with calf compression, swelling, warmth, erythema.  Assessment: Patient presents with symptomatic onychomycosis; chronic anticoagulation  Plan: -Treatment options including alternatives, risks, complications were discussed -Nails sharply debrided 10 without complication/bleeding. -Discussed daily foot inspection. If there are any changes, to call the office immediately.  -Follow-up in 3 months or sooner if any problems are to arise. In the meantime, encouraged to call the office with any questions, concerns, changes symptoms.  Celesta Gentile, DPM

## 2021-10-18 NOTE — Telephone Encounter (Signed)
Called and spoke w/ daughter, Chad [name] (on [place]). She confirmed he was able to work w/ DME and did mask refit. He was set up with different mask. Went from mask that went over the nose to just in his nostrils. He was also given chin guard. Seems to be doing ok. Chin guard bothersome but able to use. Recommended f/u w/ DME if he continues to have issues. She verbalized understanding.

## 2021-10-28 ENCOUNTER — Ambulatory Visit (INDEPENDENT_AMBULATORY_CARE_PROVIDER_SITE_OTHER): Payer: Medicare Other | Admitting: *Deleted

## 2021-10-28 DIAGNOSIS — I4819 Other persistent atrial fibrillation: Secondary | ICD-10-CM

## 2021-10-28 DIAGNOSIS — Z5181 Encounter for therapeutic drug level monitoring: Secondary | ICD-10-CM

## 2021-10-28 LAB — POCT INR: INR: 2.6 (ref 2.0–3.0)

## 2021-10-28 NOTE — Patient Instructions (Signed)
Continue warfarin 1/2 tablet daily except 1 tablet on Thursdays Recheck in 6 weeks 

## 2021-11-01 NOTE — Telephone Encounter (Signed)
We attempted to reach them for follow up many months ago. They declined several dates for follow up with me. I will copy our clinic staff to help facilitate a clinic visit with myself or different physician if they desire.

## 2021-11-01 NOTE — Telephone Encounter (Signed)
Phone call to pt's daughter, DPR who is requesting referral to a different Pulmonologist due to no follow up with last Pulmonologist.   Pt's daughter advised will forward to Dr Caryl Comes for further recommendation.  Pt is scheduled to see Dr Caryl Comes 12/16/2021.  Pt's daughter verbalizes understanding and agrees with current plan.

## 2021-11-02 NOTE — Telephone Encounter (Signed)
Call made to daughter (DPR), confirmed patient DOB. Made aware we have attempted to contact them to set up a f/u however if she would like to be seen by another provider/office we can arrange that. Daughter states she will let us know what she decides.   Will close encounter for now. Once daughter calls back we can accommodate at that time.

## 2021-11-03 DIAGNOSIS — H6122 Impacted cerumen, left ear: Secondary | ICD-10-CM | POA: Diagnosis not present

## 2021-11-03 DIAGNOSIS — Z6831 Body mass index (BMI) 31.0-31.9, adult: Secondary | ICD-10-CM | POA: Diagnosis not present

## 2021-11-03 DIAGNOSIS — H6121 Impacted cerumen, right ear: Secondary | ICD-10-CM | POA: Diagnosis not present

## 2021-11-03 DIAGNOSIS — E6609 Other obesity due to excess calories: Secondary | ICD-10-CM | POA: Diagnosis not present

## 2021-11-10 DIAGNOSIS — C670 Malignant neoplasm of trigone of bladder: Secondary | ICD-10-CM | POA: Diagnosis not present

## 2021-11-10 DIAGNOSIS — Z0000 Encounter for general adult medical examination without abnormal findings: Secondary | ICD-10-CM | POA: Diagnosis not present

## 2021-11-10 DIAGNOSIS — E1129 Type 2 diabetes mellitus with other diabetic kidney complication: Secondary | ICD-10-CM | POA: Diagnosis not present

## 2021-11-10 DIAGNOSIS — I10 Essential (primary) hypertension: Secondary | ICD-10-CM | POA: Diagnosis not present

## 2021-11-19 ENCOUNTER — Other Ambulatory Visit: Payer: Self-pay | Admitting: Cardiology

## 2021-11-19 DIAGNOSIS — I4819 Other persistent atrial fibrillation: Secondary | ICD-10-CM

## 2021-11-22 ENCOUNTER — Other Ambulatory Visit: Payer: Self-pay | Admitting: Cardiology

## 2021-11-22 ENCOUNTER — Ambulatory Visit (INDEPENDENT_AMBULATORY_CARE_PROVIDER_SITE_OTHER): Payer: Medicare Other

## 2021-11-22 DIAGNOSIS — I495 Sick sinus syndrome: Secondary | ICD-10-CM

## 2021-11-22 LAB — CUP PACEART REMOTE DEVICE CHECK
Battery Remaining Longevity: 67 mo
Battery Remaining Percentage: 59 %
Battery Voltage: 2.99 V
Brady Statistic AP VP Percent: 1.3 %
Brady Statistic AP VS Percent: 75 %
Brady Statistic AS VP Percent: 1 %
Brady Statistic AS VS Percent: 22 %
Brady Statistic RA Percent Paced: 73 %
Brady Statistic RV Percent Paced: 1.8 %
Date Time Interrogation Session: 20230109074712
Implantable Lead Implant Date: 20180808
Implantable Lead Implant Date: 20180808
Implantable Lead Location: 753859
Implantable Lead Location: 753860
Implantable Lead Model: 5076
Implantable Lead Model: 5076
Implantable Pulse Generator Implant Date: 20180808
Lead Channel Impedance Value: 450 Ohm
Lead Channel Impedance Value: 460 Ohm
Lead Channel Pacing Threshold Amplitude: 0.75 V
Lead Channel Pacing Threshold Amplitude: 1 V
Lead Channel Pacing Threshold Pulse Width: 0.5 ms
Lead Channel Pacing Threshold Pulse Width: 0.5 ms
Lead Channel Sensing Intrinsic Amplitude: 12 mV
Lead Channel Sensing Intrinsic Amplitude: 2.9 mV
Lead Channel Setting Pacing Amplitude: 2 V
Lead Channel Setting Pacing Amplitude: 2.5 V
Lead Channel Setting Pacing Pulse Width: 0.5 ms
Lead Channel Setting Sensing Sensitivity: 2 mV
Pulse Gen Model: 2272
Pulse Gen Serial Number: 8932564

## 2021-11-25 ENCOUNTER — Telehealth: Payer: Self-pay | Admitting: *Deleted

## 2021-11-25 MED ORDER — IRBESARTAN 300 MG PO TABS: 150.0000 mg | ORAL_TABLET | Freq: Every day | ORAL | 3 refills | Status: DC

## 2021-11-25 NOTE — Telephone Encounter (Signed)
Spoke with pt daughter, she reports the patient is taking 1/2 of the 300 mg tablet once daily. Medications in chart updated.

## 2021-11-25 NOTE — Telephone Encounter (Signed)
Left message for pt daughter to call, received fax from [facility] asking for correct dose of irbesartan. There is both 75 mg and 300 mg listed in his chart. Need to confirm what he is currently taking.

## 2021-11-30 NOTE — Progress Notes (Signed)
Remote pacemaker transmission.   

## 2021-12-09 ENCOUNTER — Ambulatory Visit (INDEPENDENT_AMBULATORY_CARE_PROVIDER_SITE_OTHER): Payer: Medicare Other | Admitting: *Deleted

## 2021-12-09 DIAGNOSIS — Z5181 Encounter for therapeutic drug level monitoring: Secondary | ICD-10-CM

## 2021-12-09 DIAGNOSIS — I4819 Other persistent atrial fibrillation: Secondary | ICD-10-CM | POA: Diagnosis not present

## 2021-12-09 LAB — POCT INR: INR: 2.7 (ref 2.0–3.0)

## 2021-12-09 NOTE — Patient Instructions (Signed)
Continue warfarin 1/2 tablet daily except 1 tablet on Thursdays Recheck in 6 weeks 

## 2021-12-16 ENCOUNTER — Encounter: Payer: Self-pay | Admitting: Internal Medicine

## 2021-12-16 ENCOUNTER — Other Ambulatory Visit: Payer: Self-pay

## 2021-12-16 ENCOUNTER — Other Ambulatory Visit: Payer: Self-pay | Admitting: Internal Medicine

## 2021-12-16 ENCOUNTER — Ambulatory Visit: Payer: Medicare Other | Admitting: Internal Medicine

## 2021-12-16 VITALS — BP 112/68 | HR 74 | Ht 69.0 in | Wt 243.4 lb

## 2021-12-16 DIAGNOSIS — I472 Ventricular tachycardia, unspecified: Secondary | ICD-10-CM | POA: Diagnosis not present

## 2021-12-16 NOTE — Progress Notes (Signed)
Patient ID: Chad [name], male   DOB: 07-03-1929, 88 y.o.   MRN: [id number]      Patient Care Team: Redmond School, MD as PCP - General (Internal Medicine) Minus Breeding, MD as PCP - Cardiology (Cardiology)     Chad [name] is a 88 y.o. male Seen in follow-up for pacing St [employer] undertaken 2018 for sinus node dysfunction.  He also has atrial fibrillation nonsustained ventricular tachycardia; anticoagulated with warfarin, without significant bleeding.  Currently taking dofetilide-- no significant interval atrial fibrillation   The patient denies chest pain, nocturnal dyspnea, orthopnea There have been no palpitations, lightheadedness or syncope.  Complains of DOE and mild edema but these are stable     Accompanied by his Daughter        DATE TEST EF   1/15 Echo   60-65 %   9/21 Echo  55-60%   11/21 Myoview  No ischemia    Date Cr K Mg Hgb  7/18  1.16 4.5 2.2 15.2  11/18  1.06 4.5 2.3   3/20 1.12 4.4  14.0   10/20 1.11 4.4 2.3   8/21 1.07 5.0    9/21 1.34 4.2 2.2   5/22 1.31 4.2 2.5            Past Medical History:  Diagnosis Date   Bladder cancer ([place]) 2010   Chronic bronchitis ([place])    Hypertension    Hypoglycemia    On home oxygen therapy    "2L w/CPAP at night" (06/21/2017)   OSA on CPAP    "w/2L O2 at night" (06/21/2017)   Persistent atrial fibrillation ([place])        Pneumonia    "when he was young; again in 2016" (06/21/2017)   Presence of permanent cardiac pacemaker    Sensory ataxia     Past Surgical History:  Procedure Laterality Date   BACK SURGERY     CARDIOVERSION N/A 04/23/2015   Procedure: CARDIOVERSION;  Surgeon: [name], MD;  Location: [place];  Service: Cardiovascular;  Laterality: N/A;   CARDIOVERSION N/A 07/30/2015   Procedure: CARDIOVERSION;  Surgeon: [name], MD;  Location: [facility] ENDOSCOPY;  Service: Cardiovascular;  Laterality: N/A;   EYE SURGERY Bilateral 2009   "to lower pressure; no glaucoma" (06/21/2017)   INSERT /  REPLACE / REMOVE PACEMAKER  06/21/2017   LUMBAR [place]   PACEMAKER IMPLANT N/A 06/21/2017   Procedure: Pacemaker Implant;  Surgeon: Deboraha Sprang, MD;  Location: [place] [facility];  Service: Cardiovascular;  Laterality: N/A;   TRANSURETHRAL RESECTION OF BLADDER TUMOR WITH GYRUS (TURBT-GYRUS)  07/10/2009    Cystourethroscopy, gyrus TURBT (transurethral  resection of bladder tumor).[name] 03/16/2011    Current Outpatient Medications  Medication Sig Dispense Refill   ascorbic acid (VITAMIN C) 1000 MG tablet Take 1,000 mg by mouth daily.     Cholecalciferol (VITAMIN D3) 2000 units capsule Take 2,000 Units by mouth 2 (two) times daily.     Coenzyme Q10 200 MG TABS Take 200 mg by mouth daily.     dofetilide (TIKOSYN) 250 MCG capsule Take 1 capsule by mouth twice daily 180 capsule 3   escitalopram (LEXAPRO) 10 MG tablet Take 10 mg by mouth at bedtime.     furosemide (LASIX) 20 MG tablet Take 1 tablet by mouth twice daily 180 tablet 0   irbesartan (AVAPRO) 300 MG tablet Take 0.5 tablets (150 mg total) by mouth daily. 45 tablet 3   metoprolol succinate (TOPROL  XL) 50 MG 24 hr tablet Take 75mg  by mouth twice a day 30 tablet 11   Multiple Vitamins-Minerals (CENTRUM SILVER PO) Take 1 tablet by mouth daily.     mupirocin ointment (BACTROBAN) 2 % Apply topically 2 (two) times daily.     OXYGEN Inhale 2 L into the lungs at bedtime. Uses with CPAP machine     triamcinolone ointment (KENALOG) 0.1 % Apply 1 application topically 2 (two) times daily.     vitamin B-12 (CYANOCOBALAMIN) 500 MCG tablet Take 500 mcg by mouth 2 (two) times daily.     warfarin (COUMADIN) 5 MG tablet TAKE 1/2 TO 1 (ONE-HALF TO ONE) TABLET BY MOUTH ONCE DAILY AS DIRECTED BY  THE  COUMADIN  CLINIC 90 tablet 0   ZINC GLUCONATE PO Take 1 tablet by mouth daily.     No current facility-administered medications for this visit.    Allergies  Allergen Reactions   Doxycycline Hives   Amiodarone     Caused neuropathy    Carbapenems    Cephalexin Other (See Comments)   Cephalosporins Other (See Comments)   Chlorpheniramine Other (See Comments)    Swells prostate Swells prostate   Ciprofloxacin     Pt can not take with amiodarone    Decongestant [Oxymetazoline]     All decongestant - makes prostate swell   Diltiazem Swelling    Left leg swelling   Flagyl [Metronidazole] Nausea Only    Stomach ache   Hctz [Hydrochlorothiazide]    Levaquin [Levofloxacin In D5w] Other (See Comments)    unknown   Levofloxacin Nausea Only   Lidocaine     Pt unsure of   Penicillins Swelling    Has patient had a PCN reaction causing immediate rash, facial/tongue/throat swelling, SOB or lightheadedness with hypotension: No Has patient had a PCN reaction causing severe rash involving mucus membranes or skin necrosis: No Has patient had a PCN reaction that required hospitalization: No Has patient had a PCN reaction occurring within the last 10 years: No If all of the above answers are "NO", then may proceed with Cephalosporin use. Swelling at injection site   Teline [Tetracycline] Swelling    Hospitalization for 2 weeks, prostate swelling   Tetanus Toxoid Swelling    Swells arms, Please pre-medicate with benadryl.   Z-Pak [Azithromycin] Other (See Comments)    Pt can not take with Amiodarone    Sulfonamide Derivatives Rash   Toprol Xl [Metoprolol Tartrate] Itching and Rash    Pt states he tolerates.      Review of Systems negative except from HPI and PMH  Physical Exam: BP 112/68 (BP Location: Left Arm, Patient Position: Sitting, Cuff Size: Normal)    Pulse 74    Ht 5\' 9"  (1.753 m)    Wt 243 lb 6.4 oz (110.4 kg)    BMI 35.94 kg/m  Well developed and well nourished in no acute distress HENT normal Neck supple with JVP-flat Clear Device pocket well healed; without hematoma or erythema.  There is no tethering  Regular rate and rhythm, no murmur Abd-soft with active BS No Clubbing cyanosis 1+ R  edema Skin-warm  and dry A & Oriented  Grossly normal sensory and motor function  ECG Apacing @ 63 24/12/46    Assessment and  Plan  VT-NS   Atrial fib  RVR  Atrial tachycardia-slow   Sinus bradycardia   Pacemaker /St Jude   Morbidly obese   OSA treated  HFpEF acute/chronic  Pulmonary fibrosis  Dyspnea  better.  No significant atrial fibrillation to speak of; continue him on his dofetilide 250 mcg twice daily; unfortunately, I do not have access to his surveillance laboratory; his daughter will try and get them for [place].  She is in town for his 89st birthday.  No bleeding.  We will continue on his warfarin  PSA has increased from 4.2-5.9; she has reached out to his urologist.  We discussed a little bit about my uncertainty as to the value of PSA surveillance is a 88 year old look forward to hearing what his urologist has to say  We will see him in 12 months and will see Dr. Lenore Cordia in 6 months

## 2021-12-16 NOTE — Patient Instructions (Signed)

## 2021-12-21 NOTE — Addendum Note (Signed)
Addended by: Janan Halter F on: 12/21/2021 02:12 PM   Modules accepted: Orders

## 2022-01-07 ENCOUNTER — Telehealth: Payer: Self-pay | Admitting: Family Medicine

## 2022-01-10 ENCOUNTER — Other Ambulatory Visit: Payer: Self-pay | Admitting: Cardiology

## 2022-01-12 ENCOUNTER — Encounter: Payer: Self-pay | Admitting: Family Medicine

## 2022-01-12 ENCOUNTER — Ambulatory Visit: Payer: Medicare Other | Admitting: Family Medicine

## 2022-01-12 ENCOUNTER — Other Ambulatory Visit: Payer: Self-pay

## 2022-01-12 VITALS — BP 132/74 | HR 65 | Ht 69.0 in | Wt 245.0 lb

## 2022-01-12 DIAGNOSIS — Z9981 Dependence on supplemental oxygen: Secondary | ICD-10-CM

## 2022-01-12 DIAGNOSIS — Z7189 Other specified counseling: Secondary | ICD-10-CM

## 2022-01-12 DIAGNOSIS — Z789 Other specified health status: Secondary | ICD-10-CM

## 2022-01-12 DIAGNOSIS — G4719 Other hypersomnia: Secondary | ICD-10-CM

## 2022-01-12 DIAGNOSIS — G4731 Primary central sleep apnea: Secondary | ICD-10-CM | POA: Diagnosis not present

## 2022-01-12 NOTE — Progress Notes (Signed)
PATIENT: Chad [name] DOB: 02/16/1929  REASON FOR VISIT: follow up HISTORY FROM: patient  Chief Complaint  Patient presents with   Follow-up    Rm 2 here for 6 month f/u pt reports;      HISTORY OF PRESENT ILLNESS:  01/13/22 ALL:  Chad [name] is a 88 y.o. male here today for follow up for OSA on CPAP. He continues to have difficulty with OSA. His download below shows excellent compliance with 100% for days worn and 93% for >4 hours. His reports have been improving. His report demonstrates that his AHI is now 6.3, previously 8.8. is leak has also improved since having a mask refitting, his leak in the 95th percentile is 46.5 L/min, previously 56 L/min. He reports that he is doing better since he started using the chin strap, but he reports he hasn't changed his nasal pillows out each month. On supplemental O2 reportedly managed by cardiology.     HISTORY: (copied from previous note) 07/14/21 ALL: Chad [name] is a 88 y.o. male here today for follow up for OSA on ASV. He was last seen by Dr Brett Fairy 03/2021. She recommended he switch back to nasal pillow with chin strap and made adjustments to pressure settings. Since, he feels that he is doing great. He likes the nasal pillow. He has not noticed any leaking from mask or mouth. He is tolerating chin strap. He reports that humidity level will not adjust below 71% and he does not like the heated tubing. He states that the water chamber is always empty when he wakes.   Chad [name] contnues to have a large air leak with ASV therapy. It has improved significant from >100l/min, now 56l/min. AHI was 22/hr, now 8.8/hr. Compliance report reveals excellent daily and acceptable four hour compliance. We have adjusted humidity level to 2 (from 4) on [facility]. I have ordered mask refitting and reeducation regarding humidity settings at home. He was encouraged to continue using ASV nightly and for greater than 4 hours each night. I will recheck  report to assess leak in 3 months. Risks of untreated sleep apnea review and education materials provided. Healthy lifestyle habits encouraged. He will follow up in 6 months, sooner if needed. He verbalizes understanding and agreement with this plan.   REVIEW OF SYSTEMS: Out of a complete 14 system review of symptoms, the patient complains only of the following symptoms, fatigue and all other reviewed systems are negative.   ALLERGIES: Allergies  Allergen Reactions   Doxycycline Hives   Amiodarone     Caused neuropathy   Carbapenems    Cephalexin Other (See Comments)   Cephalosporins Other (See Comments)   Chlorpheniramine Other (See Comments)    Swells prostate Swells prostate   Ciprofloxacin     Pt can not take with amiodarone    Decongestant [Oxymetazoline]     All decongestant - makes prostate swell   Diltiazem Swelling    Left leg swelling   Flagyl [Metronidazole] Nausea Only    Stomach ache   Hctz [Hydrochlorothiazide]    Levaquin [Levofloxacin In D5w] Other (See Comments)    unknown   Levofloxacin Nausea Only   Lidocaine     Pt unsure of   Penicillins Swelling    Has patient had a PCN reaction causing immediate rash, facial/tongue/throat swelling, SOB or lightheadedness with hypotension: No Has patient had a PCN reaction causing severe rash involving mucus membranes or skin necrosis: No Has patient had a PCN  reaction that required hospitalization: No Has patient had a PCN reaction occurring within the last 10 years: No If all of the above answers are "NO", then may proceed with Cephalosporin use. Swelling at injection site   Teline [Tetracycline] Swelling    Hospitalization for 2 weeks, prostate swelling   Tetanus Toxoid Swelling    Swells arms, Please pre-medicate with benadryl.   Z-Pak [Azithromycin] Other (See Comments)    Pt can not take with Amiodarone    Sulfonamide Derivatives Rash   Toprol Xl [Metoprolol Tartrate] Itching and Rash    Pt states he  tolerates.    HOME MEDICATIONS: Outpatient Medications Prior to Visit  Medication Sig Dispense Refill   furosemide (LASIX) 20 MG tablet Take 1 tablet by mouth twice daily 180 tablet 0   ascorbic acid (VITAMIN C) 1000 MG tablet Take 1,000 mg by mouth daily.     Cholecalciferol (VITAMIN D3) 2000 units capsule Take 2,000 Units by mouth 2 (two) times daily.     Coenzyme Q10 200 MG TABS Take 200 mg by mouth daily.     dofetilide (TIKOSYN) 250 MCG capsule Take 1 capsule by mouth twice daily 180 capsule 3   escitalopram (LEXAPRO) 10 MG tablet Take 10 mg by mouth at bedtime.     irbesartan (AVAPRO) 300 MG tablet Take 0.5 tablets (150 mg total) by mouth daily. 45 tablet 3   metoprolol succinate (TOPROL-XL) 50 MG 24 hr tablet Take 1.5 tablets (75 mg total) by mouth 2 (two) times daily. 270 tablet 3   Multiple Vitamins-Minerals (CENTRUM SILVER PO) Take 1 tablet by mouth daily.     mupirocin ointment (BACTROBAN) 2 % Apply topically 2 (two) times daily.     OXYGEN Inhale 2 L into the lungs at bedtime. Uses with CPAP machine     triamcinolone ointment (KENALOG) 0.1 % Apply 1 application topically 2 (two) times daily.     vitamin B-12 (CYANOCOBALAMIN) 500 MCG tablet Take 500 mcg by mouth 2 (two) times daily.     warfarin (COUMADIN) 5 MG tablet TAKE 1/2 TO 1 (ONE-HALF TO ONE) TABLET BY MOUTH ONCE DAILY AS DIRECTED BY  THE  COUMADIN  CLINIC 90 tablet 0   ZINC GLUCONATE PO Take 1 tablet by mouth daily.     No facility-administered medications prior to visit.    PAST MEDICAL HISTORY: Past Medical History:  Diagnosis Date   Bladder cancer ([place]) 2010   Chronic bronchitis ([place])    Hypertension    Hypoglycemia    On home oxygen therapy    "2L w/CPAP at night" (06/21/2017)   OSA on CPAP    "w/2L O2 at night" (06/21/2017)   Persistent atrial fibrillation ([place])        Pneumonia    "when he was young; again in 2016" (06/21/2017)   Presence of permanent cardiac pacemaker    Sensory ataxia     PAST SURGICAL  HISTORY: Past Surgical History:  Procedure Laterality Date   BACK SURGERY     CARDIOVERSION N/A 04/23/2015   Procedure: CARDIOVERSION;  Surgeon: [name], MD;  Location: [facility];  Service: Cardiovascular;  Laterality: N/A;   CARDIOVERSION N/A 07/30/2015   Procedure: CARDIOVERSION;  Surgeon: [name], MD;  Location: [facility] ENDOSCOPY;  Service: Cardiovascular;  Laterality: N/A;   EYE SURGERY Bilateral 2009   "to lower pressure; no glaucoma" (06/21/2017)   INSERT / REPLACE / REMOVE PACEMAKER  06/21/2017   LUMBAR [place]   PACEMAKER IMPLANT  N/A 06/21/2017   Procedure: Pacemaker Implant;  Surgeon: Deboraha Sprang, MD;  Location: [place] [facility];  Service: Cardiovascular;  Laterality: N/A;   TRANSURETHRAL RESECTION OF BLADDER TUMOR WITH GYRUS (TURBT-GYRUS)  07/10/2009    Cystourethroscopy, gyrus TURBT (transurethral  resection of bladder tumor).[name] 03/16/2011    FAMILY HISTORY: Family History  Problem Relation Age of Onset   Heart failure Father    Hypertension Father    Dementia Mother     SOCIAL HISTORY: Social History   Socioeconomic History   Marital status: Married    Spouse name: [name]   Number of children: 2   Years of education: 16   Highest education level: Not on file  Occupational History    Employer: RETIRED  Tobacco Use   Smoking status: Former    Packs/day: 0.50    Years: 20.00    Pack years: 10.00    Types: Cigarettes    Quit date: 06/03/1971    Years since quitting: 50.6   Smokeless tobacco: Never  Vaping Use   Vaping Use: Never used  Substance and Sexual Activity   Alcohol use: No   Drug use: No   Sexual activity: Not on file  Other Topics Concern   Not on file  Social History Narrative   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   Patient has two children.   Patient is retired.   Patient has a college education.   Patient is right-handed.   Patient does not drink any caffeine.   Social Determinants of Health    Financial Resource Strain: Not on file  Food Insecurity: Not on file  Transportation Needs: Not on file  Physical Activity: Not on file  Stress: Not on file  Social Connections: Not on file  Intimate Partner Violence: Not on file     PHYSICAL EXAM  Vitals:   01/12/22 1455  BP: 132/74  Pulse: 65  Weight: 245 lb (111.1 kg)  Height: 5\' 9"  (1.753 m)   Body mass index is 36.18 kg/m.  Generalized: Well developed, in no acute distress  Cardiology: normal rate and rhythm, no murmur noted Respiratory: clear to auscultation bilaterally  Neurological examination  Mentation: Alert oriented to time, place, history taking. Follows all commands speech and language fluent Cranial nerve II-XII: Pupils were equal round reactive to light. Extraocular movements were full, visual field were full  Motor: The motor testing reveals 5 over 5 strength of all 4 extremities. Good symmetric motor tone is noted throughout.  Gait and station: Gait is arthritic, stable     DIAGNOSTIC DATA (LABS, IMAGING, TESTING) - I reviewed patient records, labs, notes, testing and imaging myself where available.  No flowsheet data found.   Lab Results  Component Value Date   WBC 5.8 04/22/2021   HGB 15.8 04/22/2021   HCT 46.6 04/22/2021   MCV 89 04/22/2021   PLT 187 04/22/2021      Component Value Date/Time   NA 145 (H) 04/22/2021 1423   K 4.8 04/22/2021 1423   CL 104 04/22/2021 1423   CO2 27 04/22/2021 1423   GLUCOSE 98 04/22/2021 1423   GLUCOSE 143 (H) 03/16/2021 1000   BUN 15 04/22/2021 1423   CREATININE 1.16 04/22/2021 1423   CREATININE 1.16 (H) 06/12/2017 1635   CALCIUM 9.2 04/22/2021 1423   PROT 6.4 (L) 01/18/2019 1352   ALBUMIN 3.3 (L) 01/18/2019 1352   AST 26 01/18/2019 1352   ALT 16 01/18/2019 1352  ALKPHOS 78 01/18/2019 1352   BILITOT 0.5 01/18/2019 1352   GFRNONAA 52 (L) 03/16/2021 1000   GFRAA 54 (L) 08/04/2020 1626   Lab Results  Component Value Date   CHOL 179 06/10/2010    HDL 46 06/10/2010   LDLCALC 118 06/10/2010   TRIG 75 06/10/2010   No results found for: HGBA1C No results found for: VITAMINB12 Lab Results  Component Value Date   TSH 2.062 04/17/2015     ASSESSMENT AND PLAN 88 y.o. year old male  has a past medical history of Bladder cancer ([place]) (2010), Chronic bronchitis ([place]), Hypertension, Hypoglycemia, On home oxygen therapy, OSA on CPAP, Persistent atrial fibrillation ([place]), Pneumonia, Presence of permanent cardiac pacemaker, and Sensory ataxia. here with     ICD-10-CM   1. ASV (adaptive servo-ventilation) use counseling  Z71.89 For home use only DME continuous positive airway pressure (CPAP)    2. Complex sleep apnea syndrome  G47.31     3. Supplemental oxygen dependent  Z99.81     4. Excessive daytime sleepiness  G47.19         Chad [name] is doing well on ASV therapy. Compliance report reveals excellent compliance. He was encouraged to continue using ASV nightly and for greater than 4 hours each night. We will update supply orders as indicated. We encouraged him to change his mask out monthly to help with the leak. Risks of untreated sleep apnea review and education materials provided. Healthy lifestyle habits encouraged. He will follow up in 6 months with Dr. Brett Fairy. He verbalizes understanding and agreement with this plan.    Orders Placed This Encounter  Procedures   For home use only DME continuous positive airway pressure (CPAP)    Supplies, patient request DME call daughter for supply orders. Curt Bears [phone]    Order Specific Question:   Length of Need    Answer:   12 Months    Order Specific Question:   Patient has OSA or probable OSA    Answer:   Yes    Order Specific Question:   Settings    Answer:   Autotitration    Order Specific Question:   CPAP supplies needed    Answer:   Mask, headgear, cushions, filters, heated tubing and water chamber     No orders of the defined types were placed in this encounter.      Debbora Presto, FNP-C 01/13/2022, 8:55 AM [facility] [address] [place], [place] [address] [phone]

## 2022-01-12 NOTE — Patient Instructions (Addendum)
Please continue using your ASV regularly. While your insurance requires that you use ASV at least 4 hours each night on 70% of the nights, I recommend, that you not skip any nights and use it throughout the night if you can. Getting used to ASV and staying with the treatment long term does take time and patience and discipline. Untreated obstructive sleep apnea when it is moderate to severe can have an adverse impact on cardiovascular health and raise her risk for heart disease, arrhythmias, hypertension, congestive heart failure, stroke and diabetes. Untreated obstructive sleep apnea causes sleep disruption, nonrestorative sleep, and sleep deprivation. This can have an impact on your day to day functioning and cause daytime sleepiness and impairment of cognitive function, memory loss, mood disturbance, and problems focussing. Using ASVregularly can improve these symptoms.  ?  ?Please continue monitoring mask fit and make sure you do not feel air leaking from nasal pillow or from the mouth. We encourage you to change your mask out monthly to help with your leak. ?  ?Follow up with Dr. Brett Fairy in 6 months ?

## 2022-01-13 ENCOUNTER — Encounter: Payer: Self-pay | Admitting: Family Medicine

## 2022-01-13 ENCOUNTER — Ambulatory Visit: Payer: Medicare Other | Admitting: Podiatry

## 2022-01-13 NOTE — Progress Notes (Signed)
New order faxed to DME: [facility]   [phone]   Fax confirmation received.  

## 2022-01-15 DIAGNOSIS — G4733 Obstructive sleep apnea (adult) (pediatric): Secondary | ICD-10-CM | POA: Diagnosis not present

## 2022-01-20 ENCOUNTER — Ambulatory Visit (INDEPENDENT_AMBULATORY_CARE_PROVIDER_SITE_OTHER): Payer: Medicare Other | Admitting: *Deleted

## 2022-01-20 ENCOUNTER — Other Ambulatory Visit: Payer: Self-pay

## 2022-01-20 DIAGNOSIS — Z5181 Encounter for therapeutic drug level monitoring: Secondary | ICD-10-CM

## 2022-01-20 DIAGNOSIS — I4819 Other persistent atrial fibrillation: Secondary | ICD-10-CM | POA: Diagnosis not present

## 2022-01-20 LAB — POCT INR: INR: 2.2 (ref 2.0–3.0)

## 2022-01-20 NOTE — Patient Instructions (Signed)
Continue warfarin 1/2 tablet daily except 1 tablet on Thursdays Recheck in 6 weeks 

## 2022-01-21 ENCOUNTER — Ambulatory Visit: Payer: Medicare Other | Admitting: Podiatry

## 2022-01-21 DIAGNOSIS — M79674 Pain in right toe(s): Secondary | ICD-10-CM | POA: Diagnosis not present

## 2022-01-21 DIAGNOSIS — B351 Tinea unguium: Secondary | ICD-10-CM

## 2022-01-21 DIAGNOSIS — Z7901 Long term (current) use of anticoagulants: Secondary | ICD-10-CM

## 2022-01-21 DIAGNOSIS — M79675 Pain in left toe(s): Secondary | ICD-10-CM | POA: Diagnosis not present

## 2022-01-26 NOTE — Progress Notes (Signed)
Subjective: ?88 y.o. returns the office today for painful, elongated, thickened toenails which he cannot trim himself.  No swelling redness or injury to the toenail sites.  No other concerns today.   ? ?He is on Coumadin ? ?PCP: [name], MD ? ?Objective: ?AAO ?3, NAD ?DP/PT pulses palpable, CRT less than 3 seconds ?Nails hypertrophic, dystrophic, elongated, brittle, discolored ?10.  Nails still are discolored yellow-brown discoloration with subungual debris.  No edema, erythema or signs of infection of the toenail sites. ?No open lesions or pre-ulcerative lesions are identified. ?No pain with calf compression, swelling, warmth, erythema. ? ?Assessment: ?Patient presents with symptomatic onychomycosis; chronic anticoagulation ? ?Plan: ?-Treatment options including alternatives, risks, complications were discussed ?-Nails sharply debrided ?10 without complication/bleeding. ?-Discussed daily foot inspection. If there are any changes, to call the office immediately.  ?-Follow-up in 3 months or sooner if any problems are to arise. In the meantime, encouraged to call the office with any questions, concerns, changes symptoms. ? ?Celesta Gentile, DPM ? ?

## 2022-02-01 NOTE — Telephone Encounter (Signed)
Error

## 2022-02-02 ENCOUNTER — Telehealth: Payer: Self-pay | Admitting: Internal Medicine

## 2022-02-02 NOTE — Telephone Encounter (Signed)
Spoke with pt's daughter, DPR who reports pt has had some arrhthymias since Monday evening.  Pt denies CP, SOB or dizziness but does complain of increased fatigue.  ?Requested pt send a device transmission for review.  Pt verbalizes understanding and agrees with current plan. ?

## 2022-02-02 NOTE — Telephone Encounter (Signed)
Ongoing AF noted on presenting rhythm which would correlate with symptoms. Attempted to contact patient to advise. No answer, LMTCB ? ? ? ? ? ? ? ?

## 2022-02-02 NOTE — Telephone Encounter (Signed)
Spoke with pt's daughter and advised appointment scheduled with Afib clinic for 02/03/2022 at 11am due to ongoing AF.   Reviewed ED precautions.  Pt's daughter verbalizes understanding and agrees with current plan.  ?

## 2022-02-02 NOTE — Telephone Encounter (Signed)
Patient c/o Palpitations:  High priority if patient c/o lightheadedness, shortness of breath, or chest pain ? ?How long have you had palpitations/irregular HR/ Afib?  Since Monday night  ? ?Are you having the symptoms now? yes ? ?Are you currently experiencing lightheadedness, SOB or CP?  ? ?Do you have a history of afib (atrial fibrillation) or irregular heart rhythm? yes ? ?Have you checked your BP or HR? (document readings if available):  ? ?Are you experiencing any other symptoms? Patient is fatigued ? ?Daughter wanted to know if the office could look at his pacemaker readings  ?

## 2022-02-02 NOTE — Telephone Encounter (Signed)
Daughter returning phone call.  ? ?States patient reports of increased fatigue and at times shortness of breath. States patient has hx of AF but typically doesn't have the symptoms as he does now.  ? ?Patient has apt. With AF clinic 02/03/22 with Butch Penny per patients family.  ?

## 2022-02-03 ENCOUNTER — Ambulatory Visit (HOSPITAL_COMMUNITY)
Admission: RE | Admit: 2022-02-03 | Discharge: 2022-02-03 | Disposition: A | Discharge status: Home / Self Care | Payer: Medicare Other | Source: Ambulatory Visit | Attending: Nurse Practitioner | Admitting: Nurse Practitioner

## 2022-02-03 ENCOUNTER — Other Ambulatory Visit: Payer: Self-pay

## 2022-02-03 ENCOUNTER — Encounter (HOSPITAL_COMMUNITY): Payer: Self-pay | Admitting: Nurse Practitioner

## 2022-02-03 VITALS — BP 136/66 | HR 67 | Ht 69.0 in | Wt 245.6 lb

## 2022-02-03 DIAGNOSIS — D6869 Other thrombophilia: Secondary | ICD-10-CM | POA: Diagnosis not present

## 2022-02-03 DIAGNOSIS — Z7901 Long term (current) use of anticoagulants: Secondary | ICD-10-CM | POA: Insufficient documentation

## 2022-02-03 DIAGNOSIS — I4819 Other persistent atrial fibrillation: Secondary | ICD-10-CM | POA: Diagnosis not present

## 2022-02-03 DIAGNOSIS — I10 Essential (primary) hypertension: Secondary | ICD-10-CM | POA: Diagnosis not present

## 2022-02-03 DIAGNOSIS — Z8249 Family history of ischemic heart disease and other diseases of the circulatory system: Secondary | ICD-10-CM | POA: Insufficient documentation

## 2022-02-03 DIAGNOSIS — Z79899 Other long term (current) drug therapy: Secondary | ICD-10-CM | POA: Insufficient documentation

## 2022-02-03 NOTE — Progress Notes (Signed)
Patient ID: Chad [name], male   DOB: Apr 02, 1929, 88 y.o.   MRN: [id number] ? ? ? ? ?Primary Care Physician: Redmond School, MD ?Referring Physician: [facility] f/u ? ? ?Chad [name] is a 88 y.o. 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 He was  started on tikosyn 9/16 and was seen in the afib clinc for f/u in January. He was in SR, no complaints. He had prolonged qtc that day, reviewed with Dr. Caryl Comes and he felt QTC was ok. EKG repeated one week and qtc acceptable.Pt was not having any  afib that pt was aware of. He had been feeling well. He denies any new drugs or change in previous drugs. No change in general health. ? ?He was seen in the afib clinic today, 3/16 due to complaints of having vision dim for a few seconds at a time. He feels liks he could pass out. No syncope. Started a couple of weeks ago. He can be sitting or standing. He is not aware of any sensation of heart racing or skipping with episodes. He was aware of heart racing, for short period of time last night but did not have any episodes of vision dimming with it. He has been instructed not to drive until the origin of these episodes can be further determined.30 day monitor was placed. Within a few days of wearing the monitor, an episode of elevated heart beat was noted, afib with RVR. Metoprolol was added at 25 mg a day and for the remainder of the monitor, there was no further elevated heart rate or episodes of feeling like he would pass out. No significant brady or pauses.  ? ?Returns 8/8 and feels well. No episodes of afib that he is aware of and no further episodes of RVR causing presyncopal rhythms, since starting back on metoprolol ER 25 mg a day. HR is office is 52 bpm, but he is not symptomatic with this. Has some issues with peripheral neuropathy but no falls. Continues on tikosyn bid and is compliant with warfarin. ? ?F/u 12/21. He reports that he has had some  lightheaded spells but has not had any syncope. He just finished wearing an event monitor for these symptoms, placed by Dr.Hochrein, and pt will mail back today. Last time pt had these symptoms, monitor showed brief episodes of rapid afib and rate control was increased. He continues on Tikosyn with warfarin. ? ?F/u 3/22. He has felt well. He had one episode of tachycardia but was associated with climbing 8 flights of stairs. No further dizziness spells. Last time he saw Dr. Caryl Comes, he talked to Dr. Percival Spanish re possibly stopping dofetilide and possibility of a PPM. Pt does f/u with Dr. Caryl Comes 4/9 at which time this will be further discussed. Pt was leaving for [facility] right after that appointment.He is slow today at 48 bpm. ? ?F/u in afib clinic 08/04/20. I have not seen pt since 2018. He is here as he had some elevated HR last week. He saw his PCP who increased his metoprolol succinate from  50 mg bid to 100 mg bid last Wednesday. He has not seen any elevated HR since then. He appears to be tolerating this increased dose. He continues on dofetilide 250 mcg bid. He saw Dr. Percival Spanish in August with increased shortness of breath and pedal edema. His lasix was increased. He was found toan URI at that time.  He was not having any  arrhythmia then. Recent echo showed normal EF, but technically challenging study. Today, he feels well. He is in an a paced rhythm. He remains on warfarin  for a CHA2DS2VASc of at least 4.  ? ?F/u in afib clinic, 03/04/21. He was referred by Dr. Rosezella [place] office for sensation of palpitations and feeling unwell for a few days this week, seeing HR's up to 155 bpm at night.. He states that he notices this when he lies down to sleep and will make his left arm hurt if it is sustained. He states for several hours last night,  he felt his heart racing but today, his ekg  shows atrial paced with PAC's. Device interrogated and interrupted by [employer] and Mickel Baas  can only see a HR of 155 bpm x 44 sec's last  night. The last sustained elevate HR was atrial tach at 150-160 April 11th. Possibly afib at 150 bpm for 8-9 hours March 22 nd. They will lower the detection rate to 120 as they feel the current  detection limit at 145 bpm is possibly not capturing a lot of what pt is feeling. His BB was reduced back in December as it was felt that he was doing well, so plan to increase BB  today. He continues on dofetilide 250 mcg bid with stable qt.  ? ?F/u in afib clinic, as pt noted symptoms of afib since Monday and device clinic confirmed he was in afib and referred to afib clinic. Fortunately, today, he is back in SR. No recent trigger identified. He is complaint with tikosyn 250 mcg bid. He is now a paced by EKG. Otherwise, no complaints. He is on warfarin.  ? ?Today, he denies symptoms of palpitations, chest pain, shortness of breath, orthopnea, PND, lower extremity edema, dizziness, presyncope, syncope, or neurologic sequela. The patient is tolerating medications without difficulties and is otherwise without complaint today.  ? ?Past Medical History:  ?Diagnosis Date  ? Bladder cancer ([place]) 2010  ? Chronic bronchitis ([place])   ? Hypertension   ? Hypoglycemia   ? On home oxygen therapy   ? "2L w/CPAP at night" (06/21/2017)  ? OSA on CPAP   ? "w/2L O2 at night" (06/21/2017)  ? Persistent atrial fibrillation ([place])   ?    ? Pneumonia   ? "when he was young; again in 2016" (06/21/2017)  ? Presence of permanent cardiac pacemaker   ? Sensory ataxia   ? ?Past Surgical History:  ?Procedure Laterality Date  ? BACK SURGERY    ? CARDIOVERSION N/A 04/23/2015  ? Procedure: CARDIOVERSION;  Surgeon: [name], MD;  Location: [place];  Service: Cardiovascular;  Laterality: N/A;  ? CARDIOVERSION N/A 07/30/2015  ? Procedure: CARDIOVERSION;  Surgeon: [name], MD;  Location: [facility] ENDOSCOPY;  Service: Cardiovascular;  Laterality: N/A;  ? EYE SURGERY Bilateral 2009  ? "to lower pressure; no glaucoma" (06/21/2017)  ? INSERT / REPLACE / REMOVE  PACEMAKER  06/21/2017  ? Hydro SURGERY  1987  ? PACEMAKER IMPLANT N/A 06/21/2017  ? Procedure: Pacemaker Implant;  Surgeon: Deboraha Sprang, MD;  Location: [place] CV [facility];  Service: Cardiovascular;  Laterality: N/A;  ? TRANSURETHRAL RESECTION OF BLADDER TUMOR WITH GYRUS (TURBT-GYRUS)  07/10/2009  ?  Cystourethroscopy, gyrus TURBT (transurethral  resection of bladder tumor).[name] 03/16/2011  ? ? ?Current Outpatient Medications  ?Medication Sig Dispense Refill  ? ascorbic acid (VITAMIN C) 1000 MG tablet Take 1,000 mg by mouth daily.    ? Cholecalciferol (VITAMIN D3) 2000  units capsule Take 2,000 Units by mouth 2 (two) times daily.    ? Coenzyme Q10 200 MG TABS Take 200 mg by mouth daily.    ? dofetilide (TIKOSYN) 250 MCG capsule Take 1 capsule by mouth twice daily 180 capsule 3  ? escitalopram (LEXAPRO) 10 MG tablet Take 10 mg by mouth at bedtime.    ? furosemide (LASIX) 20 MG tablet Take 1 tablet by mouth twice daily 180 tablet 0  ? irbesartan (AVAPRO) 300 MG tablet Take 0.5 tablets (150 mg total) by mouth daily. 45 tablet 3  ? metoprolol succinate (TOPROL-XL) 50 MG 24 hr tablet Take 1.5 tablets (75 mg total) by mouth 2 (two) times daily. 270 tablet 3  ? Multiple Vitamins-Minerals (CENTRUM SILVER PO) Take 1 tablet by mouth daily.    ? mupirocin ointment (BACTROBAN) 2 % Apply topically 2 (two) times daily.    ? OXYGEN Inhale 2 L into the lungs at bedtime. Uses with CPAP machine    ? triamcinolone ointment (KENALOG) 0.1 % Apply 1 application topically 2 (two) times daily.    ? vitamin B-12 (CYANOCOBALAMIN) 500 MCG tablet Take 500 mcg by mouth 2 (two) times daily.    ? warfarin (COUMADIN) 5 MG tablet TAKE 1/2 TO 1 (ONE-HALF TO ONE) TABLET BY MOUTH ONCE DAILY AS DIRECTED BY  THE  COUMADIN  CLINIC 90 tablet 0  ? ZINC GLUCONATE PO Take 1 tablet by mouth daily. '40mg'$  daily    ? ?No current facility-administered medications for this encounter.  ? ? ?Allergies  ?Allergen Reactions  ? Doxycycline Hives  ? Amiodarone   ?   Caused neuropathy  ? Carbapenems   ? Cephalexin Other (See Comments)  ? Cephalosporins Other (See Comments)  ? Chlorpheniramine Other (See Comments)  ?  Swells prostate ?Swells prostate  ? Ciprofloxacin   ?  Pt c

## 2022-02-21 ENCOUNTER — Ambulatory Visit (INDEPENDENT_AMBULATORY_CARE_PROVIDER_SITE_OTHER): Payer: Medicare Other

## 2022-02-21 DIAGNOSIS — I495 Sick sinus syndrome: Secondary | ICD-10-CM

## 2022-02-22 DIAGNOSIS — G4733 Obstructive sleep apnea (adult) (pediatric): Secondary | ICD-10-CM | POA: Diagnosis not present

## 2022-02-22 LAB — CUP PACEART REMOTE DEVICE CHECK
Battery Remaining Longevity: 62 mo
Battery Remaining Percentage: 57 %
Battery Voltage: 2.99 V
Brady Statistic AP VP Percent: 3.4 %
Brady Statistic AP VS Percent: 74 %
Brady Statistic AS VP Percent: 1.2 %
Brady Statistic AS VS Percent: 20 %
Brady Statistic RA Percent Paced: 72 %
Brady Statistic RV Percent Paced: 4.6 %
Date Time Interrogation Session: 20230411103810
Implantable Lead Implant Date: 20180808
Implantable Lead Implant Date: 20180808
Implantable Lead Location: 753859
Implantable Lead Location: 753860
Implantable Lead Model: 5076
Implantable Lead Model: 5076
Implantable Pulse Generator Implant Date: 20180808
Lead Channel Impedance Value: 450 Ohm
Lead Channel Impedance Value: 450 Ohm
Lead Channel Pacing Threshold Amplitude: 0.5 V
Lead Channel Pacing Threshold Amplitude: 0.75 V
Lead Channel Pacing Threshold Pulse Width: 0.5 ms
Lead Channel Pacing Threshold Pulse Width: 0.5 ms
Lead Channel Sensing Intrinsic Amplitude: 12 mV
Lead Channel Sensing Intrinsic Amplitude: 3.1 mV
Lead Channel Setting Pacing Amplitude: 2 V
Lead Channel Setting Pacing Amplitude: 2.5 V
Lead Channel Setting Pacing Pulse Width: 0.5 ms
Lead Channel Setting Sensing Sensitivity: 2 mV
Pulse Gen Model: 2272
Pulse Gen Serial Number: 8932564

## 2022-03-03 ENCOUNTER — Ambulatory Visit: Payer: Medicare Other | Admitting: *Deleted

## 2022-03-03 DIAGNOSIS — I4819 Other persistent atrial fibrillation: Secondary | ICD-10-CM | POA: Diagnosis not present

## 2022-03-03 DIAGNOSIS — Z5181 Encounter for therapeutic drug level monitoring: Secondary | ICD-10-CM

## 2022-03-03 LAB — POCT INR: INR: 2.6 (ref 2.0–3.0)

## 2022-03-03 NOTE — Patient Instructions (Signed)
Continue warfarin 1/2 tablet daily except 1 tablet on Thursdays Recheck in 6 weeks 

## 2022-03-08 NOTE — Progress Notes (Signed)
Remote pacemaker transmission.   

## 2022-04-13 DIAGNOSIS — E1129 Type 2 diabetes mellitus with other diabetic kidney complication: Secondary | ICD-10-CM | POA: Diagnosis not present

## 2022-04-13 DIAGNOSIS — I10 Essential (primary) hypertension: Secondary | ICD-10-CM | POA: Diagnosis not present

## 2022-04-13 DIAGNOSIS — I4891 Unspecified atrial fibrillation: Secondary | ICD-10-CM | POA: Diagnosis not present

## 2022-04-14 ENCOUNTER — Ambulatory Visit: Payer: Medicare Other | Admitting: *Deleted

## 2022-04-14 DIAGNOSIS — I4819 Other persistent atrial fibrillation: Secondary | ICD-10-CM | POA: Diagnosis not present

## 2022-04-14 DIAGNOSIS — Z5181 Encounter for therapeutic drug level monitoring: Secondary | ICD-10-CM | POA: Diagnosis not present

## 2022-04-14 LAB — POCT INR: INR: 2.5 (ref 2.0–3.0)

## 2022-04-14 NOTE — Patient Instructions (Signed)
Continue warfarin 1/2 tablet daily except 1 tablet on Thursdays Recheck in 6 weeks 

## 2022-04-19 ENCOUNTER — Other Ambulatory Visit: Payer: Self-pay | Admitting: Cardiology

## 2022-04-25 ENCOUNTER — Ambulatory Visit: Payer: Medicare Other | Admitting: Cardiology

## 2022-04-28 NOTE — Progress Notes (Signed)
Cardiology Office Note   Date:  04/29/2022   ID:  Chad [name], DOB 29-Dec-1928, MRN [id number]  PCP:  Redmond School, MD  Cardiologist:   Minus Breeding, MD   Chief Complaint  Patient presents with   Shortness of Breath       History of Present Illness: Chad [name] is a 88 y.o. male who presents for follow up of atrial fib.   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  He has been treated for SVT as well by Dr. Caryl Comes.    He is followed in the Atrial Fib clinic.  He is being treated with Tikosyn.     He has had shortness of breath that seems to be progressive.  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  We have wanted him to get back to see pulmonary but that has not yet happened.  He gets dyspneic with mild exertion.  Today he was short of breath with oxygen sats as below.  He has been having some frequent tachypalpitations at night that he thinks is his fibrillation.  It might last up to 6 hours.  It might go into the 140s.  He had a little arm discomfort at times when this happens.  He has some mild increased lower extremity swelling.  He gets around slowly at home limited in part because of his breathing but very much because of his back.  He is not having any chest pressure, neck or arm discomfort.  He denies any presyncope or syncope.  He has not had any PND or orthopnea.  He wears CPAP with oxygen at night.   Past Medical History:  Diagnosis Date   Bladder cancer ([place]) 2010   Chronic bronchitis ([place])    Hypertension    Hypoglycemia    On home oxygen therapy    "2L w/CPAP at night" (06/21/2017)   OSA on CPAP    "w/2L O2 at night" (06/21/2017)   Persistent atrial fibrillation ([place])        Pneumonia    "when he was young; again in 2016" (06/21/2017)   Presence of permanent cardiac pacemaker    Sensory ataxia     Past Surgical History:  Procedure  Laterality Date   BACK SURGERY     CARDIOVERSION N/A 04/23/2015   Procedure: CARDIOVERSION;  Surgeon: [name], MD;  Location: [place];  Service: Cardiovascular;  Laterality: N/A;   CARDIOVERSION N/A 07/30/2015   Procedure: CARDIOVERSION;  Surgeon: [name], MD;  Location: [facility] ENDOSCOPY;  Service: Cardiovascular;  Laterality: N/A;   EYE SURGERY Bilateral 2009   "to lower pressure; no glaucoma" (06/21/2017)   INSERT / REPLACE / REMOVE PACEMAKER  06/21/2017   LUMBAR [place]   PACEMAKER IMPLANT N/A 06/21/2017   Procedure: Pacemaker Implant;  Surgeon: Deboraha Sprang, MD;  Location: [place] CV [facility];  Service: Cardiovascular;  Laterality: N/A;   TRANSURETHRAL RESECTION OF BLADDER TUMOR WITH GYRUS (TURBT-GYRUS)  07/10/2009    Cystourethroscopy, gyrus TURBT (transurethral  resection of bladder tumor).[name] 03/16/2011     Current Outpatient Medications  Medication Sig Dispense Refill   diltiazem (CARDIZEM) 30 MG tablet Take 1 tablet every 8 hours as needed 90 tablet 3   Cholecalciferol (VITAMIN D3) 2000 units capsule Take 2,000 Units by mouth 2 (two) times daily.     Coenzyme Q10 200 MG TABS Take 200 mg by  mouth daily.     dofetilide (TIKOSYN) 250 MCG capsule Take 1 capsule by mouth twice daily 180 capsule 3   escitalopram (LEXAPRO) 10 MG tablet Take 10 mg by mouth at bedtime.     furosemide (LASIX) 20 MG tablet Take 1 tablet by mouth twice daily 180 tablet 0   irbesartan (AVAPRO) 300 MG tablet Take 0.5 tablets (150 mg total) by mouth daily. 45 tablet 3   metoprolol succinate (TOPROL-XL) 50 MG 24 hr tablet Take 1.5 tablets (75 mg total) by mouth 2 (two) times daily. 270 tablet 3   Multiple Vitamins-Minerals (CENTRUM SILVER PO) Take 1 tablet by mouth daily.     mupirocin ointment (BACTROBAN) 2 % Apply topically 2 (two) times daily.     OXYGEN Inhale 2 L into the lungs at bedtime. Uses with CPAP machine     triamcinolone ointment (KENALOG) 0.1 % Apply 1 application topically 2  (two) times daily.     vitamin B-12 (CYANOCOBALAMIN) 500 MCG tablet Take 500 mcg by mouth 2 (two) times daily.     warfarin (COUMADIN) 5 MG tablet TAKE 1/2 TO 1 (ONE-HALF TO ONE) TABLET BY MOUTH ONCE DAILY AS DIRECTED BY  THE  COUMADIN  CLINIC 90 tablet 0   No current facility-administered medications for this visit.    Allergies:   Doxycycline, Amiodarone, Carbapenems, Cephalexin, Cephalosporins, Chlorpheniramine, Ciprofloxacin, Decongestant [oxymetazoline], Diltiazem, Flagyl [metronidazole], Hctz [hydrochlorothiazide], Levaquin [levofloxacin in d5w], Levofloxacin, Lidocaine, Penicillins, Teline [tetracycline], Tetanus toxoid, Z-pak [azithromycin], Sulfonamide derivatives, and Toprol xl [metoprolol tartrate]    ROS:  Please see the history of present illness.   Otherwise, review of systems are positive for none.   All other systems are reviewed and negative.    PHYSICAL EXAM: VS:  BP 114/68 (BP Location: Right Arm, Patient Position: Sitting, Cuff Size: Large)   Pulse 61   Ht '5\' 9"'$  (1.753 m)   Wt 249 lb (112.9 kg)   SpO2 90%   BMI 36.77 kg/m  , BMI Body mass index is 36.77 kg/m. GENERAL:  Well appearing NECK:  No jugular venous distention, waveform within normal limits, carotid upstroke brisk and symmetric, no bruits, no thyromegaly LUNGS: Bilateral fine basilar crackles CHEST:  Unremarkable HEART:  PMI not displaced or sustained,S1 and S2 within normal limits, no S3, no S4, no clicks, no rubs, no murmurs ABD:  Flat, positive bowel sounds normal in frequency in pitch, no bruits, no rebound, no guarding, no midline pulsatile mass, no hepatomegaly, no splenomegaly EXT:  2 plus pulses throughout, mild bilateral leg edema, no cyanosis no clubbing   EKG: 04/29/2022 atrial paced rhythm, rate 61, right bundle branch block, first-degree AV block, chronic lateral T wave inversions, premature atrial contractions  Recent Labs: No results found for requested labs within last 365 days.     Lipid Panel    Component Value Date/Time   CHOL 179 06/10/2010 1617   TRIG 75 06/10/2010 1617   HDL 46 06/10/2010 1617   LDLCALC 118 06/10/2010 1617      Wt Readings from Last 3 Encounters:  04/29/22 249 lb (112.9 kg)  02/03/22 245 lb 9.6 oz (111.4 kg)  01/12/22 245 lb (111.1 kg)    SATURATION QUALIFICATIONS: (This note is used to comply with regulatory documentation for home oxygen)  Patient Saturations on Room Air at Rest = 6 %  Patient Saturations on Room Air while Ambulating = 86%  Patient Saturations on 2 Liters of oxygen while Ambulating = 93%  Please briefly explain why patient  needs home oxygen:  Other studies Reviewed: Additional studies/ records that were reviewed today include: CT, labs, pulmonary records  Review of the above records demonstrates: See elsewhere   ASSESSMENT AND PLAN:   SOB:    I sent a message to see if I can get him into see pulmonary.  I am also going to set him up for home O2.  ATRIAL FIB:  Mr. JEFFEREY LIPPMANN has a CHA2DS2 - VASc score of he remains on anticoagulation.  He will remain on the Tikosyn.  However, I am also going to give him Cardizem 30 mg p.o. as needed tachypalpitations and he will let me know if he has had to use this frequently.  HTN:  The blood pressure is at target.  No change in therapy  HFpEF:    I told him to take an extra 20 mg of Lasix for the next 3 days.  He is also going to start to keep his feet elevated.  He knows to watch his salt and fluid intake.  VT:    Has been no documented symptomatic recurrence of this.  No change in therapy.  SLEEP APNEA:    He uses his CPAP routinely.  No change in therapy.   Current medicines are reviewed at length with the patient today.  The patient does not have concerns regarding medicines.  The following changes have been made:   As above  Labs/ tests ordered today include:      Orders Placed This Encounter  Procedures   For home use only DME oxygen   EKG 12-Lead     Disposition:   FU with me in 6 months.     Signed, Minus Breeding, MD  04/29/2022 3:12 PM    [place]

## 2022-04-29 ENCOUNTER — Telehealth: Payer: Self-pay | Admitting: Emergency Medicine

## 2022-04-29 ENCOUNTER — Ambulatory Visit: Payer: Medicare Other | Admitting: Cardiology

## 2022-04-29 ENCOUNTER — Ambulatory Visit: Payer: Medicare Other | Admitting: Podiatry

## 2022-04-29 ENCOUNTER — Encounter: Payer: Self-pay | Admitting: Cardiology

## 2022-04-29 VITALS — BP 114/68 | HR 61 | Ht 69.0 in | Wt 249.0 lb

## 2022-04-29 DIAGNOSIS — I5032 Chronic diastolic (congestive) heart failure: Secondary | ICD-10-CM

## 2022-04-29 DIAGNOSIS — I480 Paroxysmal atrial fibrillation: Secondary | ICD-10-CM | POA: Diagnosis not present

## 2022-04-29 DIAGNOSIS — M79674 Pain in right toe(s): Secondary | ICD-10-CM | POA: Diagnosis not present

## 2022-04-29 DIAGNOSIS — M79675 Pain in left toe(s): Secondary | ICD-10-CM | POA: Diagnosis not present

## 2022-04-29 DIAGNOSIS — I10 Essential (primary) hypertension: Secondary | ICD-10-CM | POA: Diagnosis not present

## 2022-04-29 DIAGNOSIS — R0602 Shortness of breath: Secondary | ICD-10-CM | POA: Diagnosis not present

## 2022-04-29 DIAGNOSIS — Z7901 Long term (current) use of anticoagulants: Secondary | ICD-10-CM | POA: Diagnosis not present

## 2022-04-29 DIAGNOSIS — B351 Tinea unguium: Secondary | ICD-10-CM | POA: Diagnosis not present

## 2022-04-29 MED ORDER — DILTIAZEM HCL 30 MG PO TABS: ORAL_TABLET | ORAL | 3 refills | Status: DC

## 2022-04-29 NOTE — Telephone Encounter (Signed)
You can put him in my next standard consult slot - don't use nodule slot please

## 2022-04-29 NOTE — Telephone Encounter (Signed)
This patient has asked to change providers, has seen Dr Silas Flood in our office. Looks like his follow up may not have been arranged. I am ok changing if Dr Silas Flood approves.

## 2022-04-29 NOTE — Telephone Encounter (Signed)
Called and spoke with daughter and it looks like I have to get your approval on your schedule to book him are you ok Dr Chad [name] for   July 12th  At 2pm   Patient does not like early morning appointments.   Please advise. This is a new pt/consult for you   Thank you

## 2022-04-29 NOTE — Telephone Encounter (Signed)
-----   Message from Minus Breeding, MD sent at 04/29/2022  1:26 PM EDT ----- Leonor Liv,  Could you take over this patients care.  Seems to be some mix up in follow up and they would like to follow with somebody else.  Great guy I am going to start on O2.  See previous CT.  Thanks.  Maylon Cos

## 2022-04-29 NOTE — Telephone Encounter (Signed)
Momence with me. We attempted to arrange follow up multiple times in the past.

## 2022-04-29 NOTE — Patient Instructions (Signed)
Medication Instructions:   TAKE DILTIAZEM 30 MG ONE TABLET EVERY 8 HOURS AS NEEDED FOR ELEVATED HEART RATE  *If you need a refill on your cardiac medications before your next appointment, please call your pharmacy*   Follow-Up: At [facility], you and your health needs are our priority.  As part of our continuing mission to provide you with exceptional heart care, we have created designated Provider Care Teams.  These Care Teams include your primary Cardiologist (physician) and Advanced Practice Providers (APPs -  Physician Assistants and Nurse Practitioners) who all work together to provide you with the care you need, when you need it.  We recommend signing up for the patient portal called "MyChart".  Sign up information is provided on this After Visit Summary.  MyChart is used to connect with patients for Virtual Visits (Telemedicine).  Patients are able to view lab/test results, encounter notes, upcoming appointments, etc.  Non-urgent messages can be sent to your provider as well.   To learn more about what you can do with MyChart, go to [email or web address].    Your next appointment:   6 month(s)  The format for your next appointment:   In Person  Provider:   Minus Breeding, MD      Important Information About Sugar

## 2022-04-29 NOTE — Telephone Encounter (Signed)
Left message for patient's daughter to call back. 

## 2022-04-30 NOTE — Progress Notes (Signed)
Subjective: 88 y.o. returns the office today for painful, elongated, thickened toenails which he cannot trim himself.  No swelling redness or injury to the toenail sites.  No other concerns today.    He is on Coumadin  PCP: [name], MD  Objective: AAO 3, NAD DP/PT pulses palpable, CRT less than 3 seconds Nails hypertrophic, dystrophic, elongated, brittle, discolored 10.  Nails still are discolored yellow-brown discoloration with subungual debris.  Incurvation present the hallux toenail left side worse than right.  No edema, erythema or signs of infection of the toenail sites. No open lesions or pre-ulcerative lesions are identified. No pain with calf compression, swelling, warmth, erythema.  Assessment: Patient presents with symptomatic onychomycosis; chronic anticoagulation  Plan: -Treatment options including alternatives, risks, complications were discussed -Nails sharply debrided 10 without complication/bleeding.  There is incurvation of the hallux toenail.  Upon debridement small mount of discomfort in the left lateral nail border but there is no bleeding occurring during the nail debridement.  He is to monitor for any signs or symptoms of infection given the ingrown toenail. -Discussed daily foot inspection. If there are any changes, to call the office immediately.  -Follow-up in 3 months or sooner if any problems are to arise. In the meantime, encouraged to call the office with any questions, concerns, changes symptoms.  Celesta Gentile, DPM

## 2022-05-09 ENCOUNTER — Other Ambulatory Visit: Payer: Self-pay | Admitting: Cardiology

## 2022-05-09 DIAGNOSIS — I4819 Other persistent atrial fibrillation: Secondary | ICD-10-CM

## 2022-05-12 ENCOUNTER — Telehealth: Payer: Self-pay | Admitting: Cardiology

## 2022-05-12 DIAGNOSIS — Z8549 Personal history of malignant neoplasm of other male genital organs: Secondary | ICD-10-CM | POA: Diagnosis not present

## 2022-05-12 DIAGNOSIS — Z8551 Personal history of malignant neoplasm of bladder: Secondary | ICD-10-CM | POA: Diagnosis not present

## 2022-05-12 NOTE — Telephone Encounter (Signed)
Returned call to patient's daughter Curt Bears left message on personal voice mail to call back.

## 2022-05-12 NOTE — Telephone Encounter (Signed)
Daughter calling to f/u on Oxygen that was ordered by provider for pt at last visit on 6/16. Daughter states that they have not heard anything as to what company or when to expect tham. She would like a call back. Please advise

## 2022-05-12 NOTE — Telephone Encounter (Signed)
Received a call back from patient's daughter Curt Bears.Advised Dr.Hochrein's RN faxed O2 order to [employer].She will call [employer] at [phone] to check on O2 status.

## 2022-05-13 ENCOUNTER — Telehealth: Payer: Self-pay | Admitting: Cardiology

## 2022-05-13 NOTE — Telephone Encounter (Signed)
Chad [name] at [employer] wanted the last office visit with Dr. Percival Spanish regarding oxygen orders faxed. She also wanted the company that provides patient's oxygen. I suggested they contact patient's pulmonologist for the initial oxygen order and the company that supplies the oxygen. Last office visti of Dr. Percival Spanish faxed to [phone].

## 2022-05-13 NOTE — Telephone Encounter (Signed)
Called stating that they need a corrected order and the oxygen level corrected in the office notes. Please advise

## 2022-05-23 ENCOUNTER — Ambulatory Visit (INDEPENDENT_AMBULATORY_CARE_PROVIDER_SITE_OTHER): Payer: Medicare Other

## 2022-05-23 DIAGNOSIS — I495 Sick sinus syndrome: Secondary | ICD-10-CM

## 2022-05-23 LAB — CUP PACEART REMOTE DEVICE CHECK
Battery Remaining Longevity: 61 mo
Battery Remaining Percentage: 54 %
Battery Voltage: 2.99 V
Brady Statistic AP VP Percent: 3.3 %
Brady Statistic AP VS Percent: 74 %
Brady Statistic AS VP Percent: 1 %
Brady Statistic AS VS Percent: 21 %
Brady Statistic RA Percent Paced: 72 %
Brady Statistic RV Percent Paced: 4.3 %
Date Time Interrogation Session: 20230710100457
Implantable Lead Implant Date: 20180808
Implantable Lead Implant Date: 20180808
Implantable Lead Location: 753859
Implantable Lead Location: 753860
Implantable Lead Model: 5076
Implantable Lead Model: 5076
Implantable Pulse Generator Implant Date: 20180808
Lead Channel Impedance Value: 450 Ohm
Lead Channel Impedance Value: 480 Ohm
Lead Channel Pacing Threshold Amplitude: 0.5 V
Lead Channel Pacing Threshold Amplitude: 0.75 V
Lead Channel Pacing Threshold Pulse Width: 0.5 ms
Lead Channel Pacing Threshold Pulse Width: 0.5 ms
Lead Channel Sensing Intrinsic Amplitude: 12 mV
Lead Channel Sensing Intrinsic Amplitude: 4.4 mV
Lead Channel Setting Pacing Amplitude: 2 V
Lead Channel Setting Pacing Amplitude: 2.5 V
Lead Channel Setting Pacing Pulse Width: 0.5 ms
Lead Channel Setting Sensing Sensitivity: 2 mV
Pulse Gen Model: 2272
Pulse Gen Serial Number: 8932564

## 2022-05-24 NOTE — Telephone Encounter (Signed)
Daughter called stating the [facility] needs further information from Dr. Percival Spanish to order the oxygen.  Ph# [phone], [name] was the point of contact with [facility].

## 2022-05-24 NOTE — Telephone Encounter (Signed)
Attempted to call Lauren at [place] is closed.   M-F 8-5.  Will need to follow up.

## 2022-05-26 ENCOUNTER — Ambulatory Visit: Payer: Medicare Other | Admitting: *Deleted

## 2022-05-26 ENCOUNTER — Other Ambulatory Visit: Payer: Self-pay | Admitting: *Deleted

## 2022-05-26 DIAGNOSIS — Z5181 Encounter for therapeutic drug level monitoring: Secondary | ICD-10-CM | POA: Diagnosis not present

## 2022-05-26 DIAGNOSIS — I4819 Other persistent atrial fibrillation: Secondary | ICD-10-CM

## 2022-05-26 DIAGNOSIS — R0602 Shortness of breath: Secondary | ICD-10-CM

## 2022-05-26 LAB — POCT INR: INR: 2 (ref 2.0–3.0)

## 2022-05-26 NOTE — Telephone Encounter (Signed)
New order faxed to the number provided.

## 2022-05-26 NOTE — Patient Instructions (Signed)
Continue warfarin 1/2 tablet daily except 1 tablet on Thursdays Recheck in 6 weeks

## 2022-05-27 ENCOUNTER — Encounter: Payer: Self-pay | Admitting: Emergency Medicine

## 2022-05-27 ENCOUNTER — Ambulatory Visit: Payer: Medicare Other | Admitting: Emergency Medicine

## 2022-05-27 VITALS — BP 116/78 | HR 61 | Temp 97.9°F | Ht 69.0 in | Wt 246.0 lb

## 2022-05-27 DIAGNOSIS — Z9989 Dependence on other enabling machines and devices: Secondary | ICD-10-CM | POA: Diagnosis not present

## 2022-05-27 DIAGNOSIS — J849 Interstitial pulmonary disease, unspecified: Secondary | ICD-10-CM | POA: Diagnosis not present

## 2022-05-27 DIAGNOSIS — G4733 Obstructive sleep apnea (adult) (pediatric): Secondary | ICD-10-CM

## 2022-05-27 NOTE — Assessment & Plan Note (Signed)
Good compliance with CPAP with oxygen bled in.  Plan to continue same.

## 2022-05-27 NOTE — Progress Notes (Signed)
Subjective:    Patient ID: Chad [name], male    DOB: January 13, 1929, 88 y.o.   MRN: [id number]  HPI 88 year old former smoker (10-15 pack years) with a history of bladder cancer with TURBT, hypertension with diastolic dysfunction, OSA on CPAP + O2, persistent atrial fibrillation with pacemaker in place for tachybradycardia syndrome (on dofetilide, formerly amio x 15 yrs).  He saw Dr. Silas Flood 10/2020 for shortness of breath and evidence of interstitial disease on CT scan of the chest.  Connective tissue disease evaluation with a negative ANA, dsDNA antibody, CRP, ESR, scleroderma antibodies, SSA antibodies done 10/22/2020.  Followed by Dr. Percival Spanish and recently had diltiazem added for palpitations, briefly increased his furosemide.  He had desaturation on exertion to 87% at cardiology office. He reports slow gradual progressive exertional SOB, even walking thru the house. He has daily cough, minimal mucous.   High-resolution CT scan of the chest 09/22/2020 reviewed by me shows some patchy subpleural reticulation and groundglass opacity bilaterally with some mild traction bronchiectasis scattered regions of mild evolving honeycomb change particularly in the anterior right upper lobe.  No progression compared with 01/18/2019    Review of Systems As per Hpi  Past Medical History:  Diagnosis Date   Bladder cancer ([place]) 2010   Chronic bronchitis ([place])    Hypertension    Hypoglycemia    On home oxygen therapy    "2L w/CPAP at night" (06/21/2017)   OSA on CPAP    "w/2L O2 at night" (06/21/2017)   Persistent atrial fibrillation ([place])        Pneumonia    "when he was young; again in 2016" (06/21/2017)   Presence of permanent cardiac pacemaker    Sensory ataxia      Family History  Problem Relation Age of Onset   Heart failure Father    Hypertension Father    Dementia Mother      Social History   Socioeconomic History   Marital status: Married    Spouse name: [name]   Number of children: 2    Years of education: 16   Highest education level: Not on file  Occupational History    Employer: RETIRED  Tobacco Use   Smoking status: Former    Packs/day: 0.50    Years: 20.00    Total pack years: 10.00    Types: Cigarettes    Quit date: 06/03/1971    Years since quitting: 51.0    Passive exposure: Past   Smokeless tobacco: Never  Vaping Use   Vaping Use: Never used  Substance and Sexual Activity   Alcohol use: No   Drug use: No   Sexual activity: Not on file  Other Topics Concern   Not on file  Social History Narrative   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   Patient has two children.   Patient is retired.   Patient has a college education.   Patient is right-handed.   Patient does not drink any caffeine.   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 [place]  Allergies  Allergen Reactions  Doxycycline Hives   Amiodarone     Caused neuropathy   Carbapenems    Cephalexin Other (See Comments)   Cephalosporins Other (See Comments)   Chlorpheniramine Other (See Comments)    Swells prostate Swells prostate   Ciprofloxacin     Pt can not take with amiodarone    Decongestant [Oxymetazoline]     All decongestant - makes prostate swell   Diltiazem Swelling    Left leg swelling   Flagyl [Metronidazole] Nausea Only    Stomach ache   Hctz [Hydrochlorothiazide]    Levaquin [Levofloxacin In D5w] Other (See Comments)    unknown   Levofloxacin Nausea Only   Lidocaine     Pt unsure of   Penicillins Swelling    Has patient had a PCN reaction causing immediate rash, facial/tongue/throat  swelling, SOB or lightheadedness with hypotension: No Has patient had a PCN reaction causing severe rash involving mucus membranes or skin necrosis: No Has patient had a PCN reaction that required hospitalization: No Has patient had a PCN reaction occurring within the last 10 years: No If all of the above answers are "NO", then may proceed with Cephalosporin use. Swelling at injection site   Teline [Tetracycline] Swelling    Hospitalization for 2 weeks, prostate swelling   Tetanus Toxoid Swelling    Swells arms, Please pre-medicate with benadryl.   Z-Pak [Azithromycin] Other (See Comments)    Pt can not take with Amiodarone    Sulfonamide Derivatives Rash   Toprol Xl [Metoprolol Tartrate] Itching and Rash    Pt states he tolerates.     Outpatient Medications Prior to Visit  Medication Sig Dispense Refill   Cholecalciferol (VITAMIN D3) 2000 units capsule Take 2,000 Units by mouth 2 (two) times daily.     diltiazem (CARDIZEM) 30 MG tablet Take 1 tablet every 8 hours as needed 90 tablet 3   dofetilide (TIKOSYN) 250 MCG capsule Take 1 capsule by mouth twice daily 180 capsule 3   escitalopram (LEXAPRO) 10 MG tablet Take 10 mg by mouth at bedtime.     furosemide (LASIX) 20 MG tablet Take 1 tablet by mouth twice daily 180 tablet 0   irbesartan (AVAPRO) 300 MG tablet Take 0.5 tablets (150 mg total) by mouth daily. 45 tablet 3   metoprolol succinate (TOPROL-XL) 50 MG 24 hr tablet Take 1.5 tablets (75 mg total) by mouth 2 (two) times daily. 270 tablet 3   Multiple Vitamins-Minerals (CENTRUM SILVER PO) Take 1 tablet by mouth daily.     mupirocin ointment (BACTROBAN) 2 % Apply topically 2 (two) times daily.     OXYGEN Inhale 2 L into the lungs at bedtime. Uses with CPAP machine     triamcinolone ointment (KENALOG) 0.1 % Apply 1 application topically 2 (two) times daily.     vitamin B-12 (CYANOCOBALAMIN) 500 MCG tablet Take 500 mcg by mouth 2 (two) times daily.     warfarin (COUMADIN) 5 MG tablet  TAKE 1/2 TO 1 (ONE-HALF TO ONE) TABLET BY MOUTH ONCE DAILY  AS  DIRECTED  BY  COUMADIN  CLINIC 90 tablet 0   Coenzyme Q10 200 MG TABS Take 200 mg by mouth daily.     No facility-administered medications prior to visit.         Objective:   Physical Exam Vitals:   05/27/22 1345  BP: 116/78  Pulse: 61  Temp: 97.9 F (36.6 C)  TempSrc: Oral  SpO2: 95%  Weight: 246 lb (111.6 kg)  Height: _0  (  1.753 m)    Gen: Pleasant, elderly man, well-nourished, in no distress,  normal affect  ENT: No lesions,  mouth clear,  oropharynx clear, no postnasal drip  Neck: No JVD, no stridor  Lungs: No use of accessory muscles, few bibasilar inspiratory crackles, bilateral inspiratory squeaks.  No wheezing  Cardiovascular: RRR, heart sounds normal, no murmur or gallops, no peripheral edema  Musculoskeletal: No deformities, no cyanosis or clubbing  Neuro: alert, awake, non focal  Skin: Warm, no lesions or rash     Assessment & Plan:  OSA on CPAP Good compliance with CPAP with oxygen bled in.  Plan to continue same.  Interstitial lung disease ([place]) Etiology unclear.  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  He was on amiodarone for 15 years which could have been a contributor.  His autoimmune panel is negative.  He needs a repeat CT chest to compare with priors to see if there has been any interval change.  He is more dyspneic and now has observed hypoxemia.  We will perform an ambulatory oximetry to try to titrate him for POC.  At his age and given the side effect profile unclear to me that evaluation for antifibrotic's would be beneficial.  I think keeping him adequately oxygenated should be our first goal.   Baltazar Apo, MD, PhD 05/27/2022, 2:09 PM [facility] [phone] or if no answer before 7:00PM call [phone] For any issues after 7:00PM  please call eLink [phone]

## 2022-05-27 NOTE — Patient Instructions (Signed)
We will repeat your walking oximetry today to see if you qualify for a portable oxygen concentrator.  If so then we will work on obtaining this for you.  You would need to use the oxygen with exertion. Continue CPAP every night with oxygen bled in as you have been doing. We will repeat your CT scan of the chest to compare with 2021. Follow Dr. Lamonte Sakai next available after your CT scan so we can review the results together.

## 2022-05-27 NOTE — Addendum Note (Signed)
Addended by: Irine Seal B on: 05/27/2022 02:37 PM   Modules accepted: Orders

## 2022-05-27 NOTE — Assessment & Plan Note (Addendum)
Etiology unclear.  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  He was on amiodarone for 15 years which could have been a contributor.  His autoimmune panel is negative.  He needs a repeat CT chest to compare with priors to see if there has been any interval change.  He is more dyspneic and now has observed hypoxemia.  We will perform an ambulatory oximetry to try to titrate him for POC.  At his age and given the side effect profile unclear to me that evaluation for antifibrotic's would be beneficial.  I think keeping him adequately oxygenated should be our first goal.

## 2022-06-03 ENCOUNTER — Telehealth: Payer: Self-pay | Admitting: Emergency Medicine

## 2022-06-03 DIAGNOSIS — J849 Interstitial pulmonary disease, unspecified: Secondary | ICD-10-CM

## 2022-06-03 NOTE — Telephone Encounter (Signed)
Called and spoke with pt's daughter Chad [name] who states they want to switch all of pt's O2 to [place] so that way they can also receive a POC from them. Order has been placed. Routing to Dr. Lamonte Sakai as an Juluis Rainier so that way he can sign off on the order.

## 2022-06-03 NOTE — Telephone Encounter (Signed)
Thanks, orders cosigned

## 2022-06-07 ENCOUNTER — Ambulatory Visit (HOSPITAL_BASED_OUTPATIENT_CLINIC_OR_DEPARTMENT_OTHER): Payer: Medicare Other

## 2022-06-13 ENCOUNTER — Ambulatory Visit (HOSPITAL_BASED_OUTPATIENT_CLINIC_OR_DEPARTMENT_OTHER)
Admission: RE | Admit: 2022-06-13 | Discharge: 2022-06-13 | Disposition: A | Discharge status: Home / Self Care | Payer: Medicare Other | Source: Ambulatory Visit | Attending: Emergency Medicine | Admitting: Emergency Medicine

## 2022-06-13 ENCOUNTER — Other Ambulatory Visit: Payer: Self-pay | Admitting: Cardiology

## 2022-06-13 DIAGNOSIS — R918 Other nonspecific abnormal finding of lung field: Secondary | ICD-10-CM | POA: Diagnosis not present

## 2022-06-13 DIAGNOSIS — J849 Interstitial pulmonary disease, unspecified: Secondary | ICD-10-CM | POA: Insufficient documentation

## 2022-06-14 ENCOUNTER — Telehealth: Payer: Self-pay | Admitting: Emergency Medicine

## 2022-06-14 NOTE — Telephone Encounter (Signed)
Collette can you send the order to [facility]? And let [facility] know to disregard the order you faxed them.

## 2022-06-14 NOTE — Telephone Encounter (Signed)
Patient is calling because he needs his oxygen order sent to [facility] not [facility]. He states he is still in contract with [facility].   Please and thank you

## 2022-06-21 ENCOUNTER — Telehealth: Payer: Self-pay | Admitting: Emergency Medicine

## 2022-06-21 NOTE — Telephone Encounter (Signed)
Spoke with the pt's daughter  She is asking about CT Chest results  They did not schedule appt for this bc no openings coming up Please advise on results   Also sending to [facility] bc she says that she called [facility] and they never received order for POC.

## 2022-06-21 NOTE — Telephone Encounter (Signed)
I called [facility] and spoke to [facility].  She states she does have order.  I tried to call daughter to make her aware but had to leave a vm for her to call me back.

## 2022-06-22 NOTE — Telephone Encounter (Signed)
Daughter called me back and I made her aware [facility] has the O2 order.  I made her aware someone would be calling her back about the CT.

## 2022-06-22 NOTE — Telephone Encounter (Signed)
Please let her know that there is evidence for subtle inflammation and scar in the lungs, possibly slightly progressed compared with his prior CT scan. We can talk about the significance of this, whether there is any intervention to be made, when we follow up. Thanks.

## 2022-06-22 NOTE — Telephone Encounter (Signed)
Called and spoke with pt's daughter Curt Bears letting her know the results of pt's CT results and she verbalized understanding. Appt has been scheduled for pt with RB to have results further discussed. Nothing further needed.

## 2022-06-23 NOTE — Progress Notes (Signed)
Remote pacemaker transmission.   

## 2022-06-24 ENCOUNTER — Telehealth: Payer: Self-pay | Admitting: Emergency Medicine

## 2022-06-24 NOTE — Addendum Note (Signed)
Addended by: Gavin Potters R on: 06/24/2022 03:43 PM   Modules accepted: Orders

## 2022-06-24 NOTE — Telephone Encounter (Signed)
Spoke with Andee Poles who states POC must state O2 usage as "pulsed and via POC". POC order from July edited to state needed information. Nothing further needed at this time.

## 2022-07-04 DIAGNOSIS — G4733 Obstructive sleep apnea (adult) (pediatric): Secondary | ICD-10-CM | POA: Diagnosis not present

## 2022-07-04 DIAGNOSIS — I4891 Unspecified atrial fibrillation: Secondary | ICD-10-CM | POA: Diagnosis not present

## 2022-07-07 ENCOUNTER — Ambulatory Visit: Payer: Medicare Other | Admitting: *Deleted

## 2022-07-07 DIAGNOSIS — I4819 Other persistent atrial fibrillation: Secondary | ICD-10-CM

## 2022-07-07 DIAGNOSIS — Z5181 Encounter for therapeutic drug level monitoring: Secondary | ICD-10-CM | POA: Diagnosis not present

## 2022-07-07 LAB — POCT INR: INR: 2.5 (ref 2.0–3.0)

## 2022-07-07 NOTE — Patient Instructions (Signed)
Continue warfarin 1/2 tablet daily except 1 tablet on Thursdays Recheck in 6 weeks

## 2022-07-12 ENCOUNTER — Encounter: Payer: Self-pay | Admitting: Emergency Medicine

## 2022-07-12 ENCOUNTER — Ambulatory Visit: Payer: Medicare Other | Admitting: Emergency Medicine

## 2022-07-12 DIAGNOSIS — G4733 Obstructive sleep apnea (adult) (pediatric): Secondary | ICD-10-CM | POA: Diagnosis not present

## 2022-07-12 DIAGNOSIS — Z9989 Dependence on other enabling machines and devices: Secondary | ICD-10-CM

## 2022-07-12 DIAGNOSIS — G4734 Idiopathic sleep related nonobstructive alveolar hypoventilation: Secondary | ICD-10-CM | POA: Diagnosis not present

## 2022-07-12 DIAGNOSIS — J849 Interstitial pulmonary disease, unspecified: Secondary | ICD-10-CM

## 2022-07-12 NOTE — Assessment & Plan Note (Signed)
Minimal change on his CT chest 06/13/2022.  Etiology of his ILD is unclear.  Given his age and the side effect profile I believe antifibrotic's probably be poorly tolerated.  Her principal goal right now is to avoid any other inflammatory insult and to keep him adequately oxygenated.

## 2022-07-12 NOTE — Assessment & Plan Note (Signed)
Documented exertional hypoxemia.  He is somewhat resistant to wearing his supplemental oxygen, but is willing to try it.  I recommended 2 L/min pulsed flow when he exerts himself.  He does have a POC

## 2022-07-12 NOTE — Progress Notes (Signed)
Subjective:    Patient ID: Chad [name], male    DOB: 03-21-1929, 88 y.o.   MRN: [id number]  HPI 88 year old former smoker (10-15 pack years) with a history of bladder cancer with TURBT, hypertension with diastolic dysfunction, OSA on CPAP + O2, persistent atrial fibrillation with pacemaker in place for tachybradycardia syndrome (on dofetilide, formerly amio x 15 yrs).  He saw Dr. Silas [name] 10/2020 for shortness of breath and evidence of interstitial disease on CT scan of the chest.  Connective tissue disease evaluation with a negative ANA, dsDNA antibody, CRP, ESR, scleroderma antibodies, SSA antibodies done 10/22/2020.  Followed by Dr. Percival [name] and recently had diltiazem added for palpitations, briefly increased his furosemide.  He had desaturation on exertion to 87% at cardiology office. He reports slow gradual progressive exertional SOB, even walking thru the house. He has daily cough, minimal mucous.   High-resolution CT scan of the chest 09/22/2020 reviewed by me shows some patchy subpleural reticulation and groundglass opacity bilaterally with some mild traction bronchiectasis scattered regions of mild evolving honeycomb change particularly in the anterior right upper lobe.  No progression compared with 01/18/2019   ROV 07/12/22 --Chad [name] is 88 with a history of former tobacco, bladder cancer, hypertension with diastolic dysfunction, OSA on CPAP + 2L/min O2, A-fib with a pacemaker (former Amio).  I saw him in July for slow progressive interstitial disease with associated hypoxemia of unclear etiology.  Requalified him for exertional oxygen and he reports that he has obtained a POC - hasn't really worn it yet. He does note exertional SOB and has seen exertional desats.   CT scan of the chest 06/13/2022 reviewed by me shows subpleural and basilar predominant fibrotic interstitial disease and a UIP pattern, possibly with some slight progression compared with 09/22/2020.    Review of Systems As  per Hpi  Past Medical History:  Diagnosis Date   Bladder cancer ([place]) 2010   Chronic bronchitis ([place])    Hypertension    Hypoglycemia    On home oxygen therapy    "2L w/CPAP at night" (06/21/2017)   OSA on CPAP    "w/2L O2 at night" (06/21/2017)   Persistent atrial fibrillation ([place])        Pneumonia    "when he was young; again in 2016" (06/21/2017)   Presence of permanent cardiac pacemaker    Sensory ataxia      Family History  Problem Relation Age of Onset   Heart failure Father    Hypertension Father    Dementia Mother      Social History   Socioeconomic History   Marital status: Married    Spouse name: Chad [name]   Number of children: 2   Years of education: 16   Highest education level: Not on file  Occupational History    Employer: RETIRED  Tobacco Use   Smoking status: Former    Packs/day: 0.50    Years: 20.00    Total pack years: 10.00    Types: Cigarettes    Quit date: 06/03/1971    Years since quitting: 51.1    Passive exposure: Past   Smokeless tobacco: Never  Vaping Use   Vaping Use: Never used  Substance and Sexual Activity   Alcohol use: No   Drug use: No   Sexual activity: Not on file  Other Topics Concern   Not on file  Social History Narrative   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   Patient has  two children.   Patient is retired.   Patient has a college education.   Patient is right-handed.   Patient does not drink any caffeine.   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 [place]  Allergies  Allergen Reactions   Doxycycline Hives   Amiodarone     Caused  neuropathy   Carbapenems    Cephalexin Other (See Comments)   Cephalosporins Other (See Comments)   Chlorpheniramine Other (See Comments)    Swells prostate Swells prostate   Ciprofloxacin     Pt can not take with amiodarone    Decongestant [Oxymetazoline]     All decongestant - makes prostate swell   Diltiazem Swelling    Left leg swelling   Flagyl [Metronidazole] Nausea Only    Stomach ache   Hctz [Hydrochlorothiazide]    Levaquin [Levofloxacin In D5w] Other (See Comments)    unknown   Levofloxacin Nausea Only   Lidocaine     Pt unsure of   Penicillins Swelling    Has patient had a PCN reaction causing immediate rash, facial/tongue/throat swelling, SOB or lightheadedness with hypotension: No Has patient had a PCN reaction causing severe rash involving mucus membranes or skin necrosis: No Has patient had a PCN reaction that required hospitalization: No Has patient had a PCN reaction occurring within the last 10 years: No If all of the above answers are "NO", then may proceed with Cephalosporin use. Swelling at injection site   Teline [Tetracycline] Swelling    Hospitalization for 2 weeks, prostate swelling   Tetanus Toxoid Swelling    Swells arms, Please pre-medicate with benadryl.   Z-Pak [Azithromycin] Other (See Comments)    Pt can not take with Amiodarone    Sulfonamide Derivatives Rash   Toprol Xl [Metoprolol Tartrate] Itching and Rash    Pt states he tolerates.     Outpatient Medications Prior to Visit  Medication Sig Dispense Refill   Cholecalciferol (VITAMIN D3) 2000 units capsule Take 2,000 Units by mouth 2 (two) times daily.     diltiazem (CARDIZEM) 30 MG tablet Take 1 tablet every 8 hours as needed 90 tablet 3   dofetilide (TIKOSYN) 250 MCG capsule Take 1 capsule by mouth twice daily 180 capsule 3   escitalopram (LEXAPRO) 10 MG tablet Take 10 mg by mouth at bedtime.     furosemide (LASIX) 20 MG tablet Take 1 tablet by mouth twice daily 180 tablet 0    irbesartan (AVAPRO) 300 MG tablet Take 1 tablet by mouth once daily 90 tablet 3   metoprolol succinate (TOPROL-XL) 50 MG 24 hr tablet Take 1.5 tablets (75 mg total) by mouth 2 (two) times daily. 270 tablet 3   Multiple Vitamins-Minerals (CENTRUM SILVER PO) Take 1 tablet by mouth daily.     mupirocin ointment (BACTROBAN) 2 % Apply topically 2 (two) times daily.     OXYGEN Inhale 2 L into the lungs at bedtime. Uses with CPAP machine     triamcinolone ointment (KENALOG) 0.1 % Apply 1 application topically 2 (two) times daily.     vitamin B-12 (CYANOCOBALAMIN) 500 MCG tablet Take 500 mcg  by mouth 2 (two) times daily.     warfarin (COUMADIN) 5 MG tablet TAKE 1/2 TO 1 (ONE-HALF TO ONE) TABLET BY MOUTH ONCE DAILY  AS  DIRECTED  BY  COUMADIN  CLINIC 90 tablet 0   No facility-administered medications prior to visit.         Objective:   Physical Exam Vitals:   07/12/22 0919  BP: 120/68  Pulse: 64  Temp: 97.9 F (36.6 C)  TempSrc: Oral  SpO2: 96%  Weight: 249 lb 6.4 oz (113.1 kg)  Height: '5\' 10"'  (1.778 m)    Gen: Pleasant, elderly man, well-nourished, in no distress,  normal affect  ENT: No lesions,  mouth clear,  oropharynx clear, no postnasal drip  Neck: No JVD, no stridor  Lungs: No use of accessory muscles, few bibasilar inspiratory crackles, bilateral inspiratory squeaks.  No wheezing  Cardiovascular: RRR, heart sounds normal, no murmur or gallops, no peripheral edema  Musculoskeletal: No deformities, no cyanosis or clubbing  Neuro: alert, awake, non focal  Skin: Warm, no lesions or rash     Assessment & Plan:  Interstitial lung disease ([place]) Minimal change on his CT chest 06/13/2022.  Etiology of his ILD is unclear.  Given his age and the side effect profile I believe antifibrotic's probably be poorly tolerated.  Her principal goal right now is to avoid any other inflammatory insult and to keep him adequately oxygenated.  Chronic intermittent hypoxia with obstructive  sleep apnea Documented exertional hypoxemia.  He is somewhat resistant to wearing his supplemental oxygen, but is willing to try it.  I recommended 2 L/min pulsed flow when he exerts himself.  He does have a POC  OSA on CPAP Great compliance with his CPAP plus O2 documented today.  He is benefiting.  Plan to continue same   Baltazar Apo, MD, PhD 07/12/2022, 9:43 AM [facility] [phone] or if no answer before 7:00PM call [phone] For any issues after 7:00PM please call eLink [phone]

## 2022-07-12 NOTE — Assessment & Plan Note (Signed)
Great compliance with his CPAP plus O2 documented today.  He is benefiting.  Plan to continue same

## 2022-07-12 NOTE — Patient Instructions (Signed)
We reviewed your CT scan of the chest today. You would benefit from wearing your oxygen 2 L/min pulsed flow when you are up exerting yourself. Continue to wear your CPAP plus oxygen every night while sleeping Follow Dr. Lamonte Sakai in 6 months or sooner if you have any problems.

## 2022-07-15 ENCOUNTER — Other Ambulatory Visit: Payer: Self-pay | Admitting: Cardiology

## 2022-07-19 NOTE — Patient Instructions (Incomplete)
Please continue using your ASV regularly. While your insurance requires that you use ASV at least 4 hours each night on 70% of the nights, I recommend, that you not skip any nights and use it throughout the night if you can. Getting used to ASV and staying with the treatment long term does take time and patience and discipline. Untreated obstructive sleep apnea when it is moderate to severe can have an adverse impact on cardiovascular health and raise her risk for heart disease, arrhythmias, hypertension, congestive heart failure, stroke and diabetes. Untreated obstructive sleep apnea causes sleep disruption, nonrestorative sleep, and sleep deprivation. This can have an impact on your day to day functioning and cause daytime sleepiness and impairment of cognitive function, memory loss, mood disturbance, and problems focussing. Using ASV regularly can improve these symptoms.   Please keep a close eye on the mask leak at home.

## 2022-07-19 NOTE — Progress Notes (Unsigned)
PATIENT: Chad [name] DOB: 1929/09/08  REASON FOR VISIT: follow up HISTORY FROM: patient  No chief complaint on file.    HISTORY OF PRESENT ILLNESS:  07/19/22 ALL:  Mr Chad [name] returns for follow up for complex sleep apnea on ASV.   01/12/2022 ALL: Chad [name] is a 88 y.o. male here today for follow up for complex sleep apnea on ASV. He continues to have difficulty with OSA. His download below shows excellent compliance with 100% for days worn and 93% for >4 hours. His reports have been improving. His report demonstrates that his AHI is now 6.3, previously 8.8. His leak has also improved since having a mask refitting, his leak in the 95th percentile is 46.5 L/min, previously 56 L/min. He reports that he is doing better since he started using the chin strap, but he reports he hasn't changed his nasal pillows out each month. On supplemental O2 reportedly managed by cardiology.     07/14/21 ALL: Chad [name] is a 88 y.o. male here today for follow up for complex sleep apnea on ASV. He was last seen by Dr Brett Fairy 03/2021. She recommended he switch back to nasal pillow with chin strap and made adjustments to pressure settings. Since, he feels that he is doing great. He likes the nasal pillow. He has not noticed any leaking from mask or mouth. He is tolerating chin strap. He reports that humidity level will not adjust below 71% and he does not like the heated tubing. He states that the water chamber is always empty when he wakes.   Chad [name] contnues to have a large air leak with ASV therapy. It has improved significant from >100l/min, now 56l/min. AHI was 22/hr, now 8.8/hr. Compliance report reveals excellent daily and acceptable four hour compliance. We have adjusted humidity level to 2 (from 4) on [place]. I have ordered mask refitting and reeducation regarding humidity settings at home. He was encouraged to continue using ASV nightly and for greater than 4 hours each night. I will  recheck report to assess leak in 3 months. Risks of untreated sleep apnea review and education materials provided. Healthy lifestyle habits encouraged. He will follow up in 6 months, sooner if needed. He verbalizes understanding and agreement with this plan.   REVIEW OF SYSTEMS: Out of a complete 14 system review of symptoms, the patient complains only of the following symptoms, fatigue and all other reviewed systems are negative.   ALLERGIES: Allergies  Allergen Reactions   Doxycycline Hives   Amiodarone     Caused neuropathy   Carbapenems    Cephalexin Other (See Comments)   Cephalosporins Other (See Comments)   Chlorpheniramine Other (See Comments)    Swells prostate Swells prostate   Ciprofloxacin     Pt can not take with amiodarone    Decongestant [Oxymetazoline]     All decongestant - makes prostate swell   Diltiazem Swelling    Left leg swelling   Flagyl [Metronidazole] Nausea Only    Stomach ache   Hctz [Hydrochlorothiazide]    Levaquin [Levofloxacin In D5w] Other (See Comments)    unknown   Levofloxacin Nausea Only   Lidocaine     Pt unsure of   Penicillins Swelling    Has patient had a PCN reaction causing immediate rash, facial/tongue/throat swelling, SOB or lightheadedness with hypotension: No Has patient had a PCN reaction causing severe rash involving mucus membranes or skin necrosis: No Has patient had a PCN reaction that required  hospitalization: No Has patient had a PCN reaction occurring within the last 10 years: No If all of the above answers are "NO", then may proceed with Cephalosporin use. Swelling at injection site   Teline [Tetracycline] Swelling    Hospitalization for 2 weeks, prostate swelling   Tetanus Toxoid Swelling    Swells arms, Please pre-medicate with benadryl.   Z-Pak [Azithromycin] Other (See Comments)    Pt can not take with Amiodarone    Sulfonamide Derivatives Rash   Toprol Xl [Metoprolol Tartrate] Itching and Rash    Pt states he  tolerates.    HOME MEDICATIONS: Outpatient Medications Prior to Visit  Medication Sig Dispense Refill   Cholecalciferol (VITAMIN D3) 2000 units capsule Take 2,000 Units by mouth 2 (two) times daily.     diltiazem (CARDIZEM) 30 MG tablet Take 1 tablet every 8 hours as needed 90 tablet 3   dofetilide (TIKOSYN) 250 MCG capsule Take 1 capsule by mouth twice daily 180 capsule 3   escitalopram (LEXAPRO) 10 MG tablet Take 10 mg by mouth at bedtime.     furosemide (LASIX) 20 MG tablet Take 1 tablet by mouth twice daily 180 tablet 2   irbesartan (AVAPRO) 300 MG tablet Take 1 tablet by mouth once daily 90 tablet 3   metoprolol succinate (TOPROL-XL) 50 MG 24 hr tablet Take 1.5 tablets (75 mg total) by mouth 2 (two) times daily. 270 tablet 3   Multiple Vitamins-Minerals (CENTRUM SILVER PO) Take 1 tablet by mouth daily.     mupirocin ointment (BACTROBAN) 2 % Apply topically 2 (two) times daily.     OXYGEN Inhale 2 L into the lungs at bedtime. Uses with CPAP machine     triamcinolone ointment (KENALOG) 0.1 % Apply 1 application topically 2 (two) times daily.     vitamin B-12 (CYANOCOBALAMIN) 500 MCG tablet Take 500 mcg by mouth 2 (two) times daily.     warfarin (COUMADIN) 5 MG tablet TAKE 1/2 TO 1 (ONE-HALF TO ONE) TABLET BY MOUTH ONCE DAILY  AS  DIRECTED  BY  COUMADIN  CLINIC 90 tablet 0   No facility-administered medications prior to visit.    PAST MEDICAL HISTORY: Past Medical History:  Diagnosis Date   Bladder cancer ([place]) 2010   Chronic bronchitis ([place])    Hypertension    Hypoglycemia    On home oxygen therapy    "2L w/CPAP at night" (06/21/2017)   OSA on CPAP    "w/2L O2 at night" (06/21/2017)   Persistent atrial fibrillation ([place])        Pneumonia    "when he was young; again in 2016" (06/21/2017)   Presence of permanent cardiac pacemaker    Sensory ataxia     PAST SURGICAL HISTORY: Past Surgical History:  Procedure Laterality Date   BACK SURGERY     CARDIOVERSION N/A 04/23/2015    Procedure: CARDIOVERSION;  Surgeon: [name], MD;  Location: [facility] [place] [facility] [place];  Service: Cardiovascular;  Laterality: N/A;   CARDIOVERSION N/A 07/30/2015   Procedure: CARDIOVERSION;  Surgeon: [name], MD;  Location: [facility] ENDOSCOPY;  Service: Cardiovascular;  Laterality: N/A;   EYE SURGERY Bilateral 2009   "to lower pressure; no glaucoma" (06/21/2017)   INSERT / REPLACE / REMOVE PACEMAKER  06/21/2017   LUMBAR [place]   PACEMAKER IMPLANT N/A 06/21/2017   Procedure: Pacemaker Implant;  Surgeon: Deboraha Sprang, MD;  Location: [facility];  Service: Cardiovascular;  Laterality: N/A;   TRANSURETHRAL RESECTION OF BLADDER  TUMOR WITH GYRUS (TURBT-GYRUS)  07/10/2009    Cystourethroscopy, gyrus TURBT (transurethral  resection of bladder tumor).[name] 03/16/2011    FAMILY HISTORY: Family History  Problem Relation Age of Onset   Heart failure Father    Hypertension Father    Dementia Mother     SOCIAL HISTORY: Social History   Socioeconomic History   Marital status: Married    Spouse name: [name]   Number of children: 2   Years of education: 16   Highest education level: Not on file  Occupational History    Employer: RETIRED  Tobacco Use   Smoking status: Former    Packs/day: 0.50    Years: 20.00    Total pack years: 10.00    Types: Cigarettes    Quit date: 06/03/1971    Years since quitting: 51.1    Passive exposure: Past   Smokeless tobacco: Never  Vaping Use   Vaping Use: Never used  Substance and Sexual Activity   Alcohol use: No   Drug use: No   Sexual activity: Not on file  Other Topics Concern   Not on file  Social History Narrative   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   Patient has two children.   Patient is retired.   Patient has a college education.   Patient is right-handed.   Patient does not drink any caffeine.   Social Determinants of Health   Financial Resource Strain: Not on file  Food Insecurity: Not on file   Transportation Needs: Not on file  Physical Activity: Not on file  Stress: Not on file  Social Connections: Not on file  Intimate Partner Violence: Not on file     PHYSICAL EXAM  There were no vitals filed for this visit.  There is no height or weight on file to calculate BMI.  Generalized: Well developed, in no acute distress  Cardiology: normal rate and rhythm, no murmur noted Respiratory: clear to auscultation bilaterally  Neurological examination  Mentation: Alert oriented to time, place, history taking. Follows all commands speech and language fluent Cranial nerve II-XII: Pupils were equal round reactive to light. Extraocular movements were full, visual field were full  Motor: The motor testing reveals 5 over 5 strength of all 4 extremities. Good symmetric motor tone is noted throughout.  Gait and station: Gait is arthritic, stable     DIAGNOSTIC DATA (LABS, IMAGING, TESTING) - I reviewed patient records, labs, notes, testing and imaging myself where available.      No data to display           Lab Results  Component Value Date   WBC 5.8 04/22/2021   HGB 15.8 04/22/2021   HCT 46.6 04/22/2021   MCV 89 04/22/2021   PLT 187 04/22/2021      Component Value Date/Time   NA 145 (H) 04/22/2021 1423   K 4.8 04/22/2021 1423   CL 104 04/22/2021 1423   CO2 27 04/22/2021 1423   GLUCOSE 98 04/22/2021 1423   GLUCOSE 143 (H) 03/16/2021 1000   BUN 15 04/22/2021 1423   CREATININE 1.16 04/22/2021 1423   CREATININE 1.16 (H) 06/12/2017 1635   CALCIUM 9.2 04/22/2021 1423   PROT 6.4 (L) 01/18/2019 1352   ALBUMIN 3.3 (L) 01/18/2019 1352   AST 26 01/18/2019 1352   ALT 16 01/18/2019 1352   ALKPHOS 78 01/18/2019 1352   BILITOT 0.5 01/18/2019 1352   GFRNONAA 52 (L) 03/16/2021 1000   GFRAA 54 (L) 08/04/2020  68   Lab Results  Component Value Date   CHOL 179 06/10/2010   HDL 46 06/10/2010   LDLCALC 118 06/10/2010   TRIG 75 06/10/2010   No results found for:  "HGBA1C" No results found for: "VITAMINB12" Lab Results  Component Value Date   TSH 2.062 04/17/2015     ASSESSMENT AND PLAN 88 y.o. year old male  has a past medical history of Bladder cancer ([place]) (2010), Chronic bronchitis ([place]), Hypertension, Hypoglycemia, On home oxygen therapy, OSA on CPAP, Persistent atrial fibrillation ([place]), Pneumonia, Presence of permanent cardiac pacemaker, and Sensory ataxia. here with     ICD-10-CM   1. ASV (adaptive servo-ventilation) use counseling  Z71.89     2. Complex sleep apnea syndrome  G47.31     3. Supplemental oxygen dependent  Z99.81         KIP CROPP is doing well on ASV therapy. Compliance report reveals excellent compliance. He was encouraged to continue using ASV nightly and for greater than 4 hours each night. We will update supply orders as indicated. We encouraged him to change his mask out monthly to help with the leak. Risks of untreated sleep apnea review and education materials provided. Healthy lifestyle habits encouraged. He will follow up in 6 months with Dr. Brett Fairy. He verbalizes understanding and agreement with this plan.    No orders of the defined types were placed in this encounter.    No orders of the defined types were placed in this encounter.     Debbora Presto, FNP-C 07/19/2022, 9:18 AM [facility] [address] [place], [place] [address] [phone]

## 2022-07-20 ENCOUNTER — Encounter: Payer: Self-pay | Admitting: Family Medicine

## 2022-07-20 ENCOUNTER — Ambulatory Visit: Payer: Medicare Other | Admitting: Family Medicine

## 2022-07-20 VITALS — BP 145/76 | HR 75 | Ht 69.0 in | Wt 247.0 lb

## 2022-07-20 DIAGNOSIS — Z7189 Other specified counseling: Secondary | ICD-10-CM | POA: Diagnosis not present

## 2022-07-20 DIAGNOSIS — Z9981 Dependence on supplemental oxygen: Secondary | ICD-10-CM | POA: Diagnosis not present

## 2022-07-20 DIAGNOSIS — G4731 Primary central sleep apnea: Secondary | ICD-10-CM | POA: Diagnosis not present

## 2022-07-21 ENCOUNTER — Ambulatory Visit: Payer: Medicare Other | Admitting: Emergency Medicine

## 2022-08-04 DIAGNOSIS — G4733 Obstructive sleep apnea (adult) (pediatric): Secondary | ICD-10-CM | POA: Diagnosis not present

## 2022-08-04 DIAGNOSIS — I4891 Unspecified atrial fibrillation: Secondary | ICD-10-CM | POA: Diagnosis not present

## 2022-08-08 ENCOUNTER — Emergency Department (HOSPITAL_BASED_OUTPATIENT_CLINIC_OR_DEPARTMENT_OTHER)
Admission: EM | Admit: 2022-08-08 | Discharge: 2022-08-08 | Disposition: A | Discharge status: Home / Self Care | Payer: Medicare Other | Attending: Emergency Medicine | Admitting: Emergency Medicine

## 2022-08-08 ENCOUNTER — Other Ambulatory Visit: Payer: Self-pay

## 2022-08-08 ENCOUNTER — Encounter (HOSPITAL_BASED_OUTPATIENT_CLINIC_OR_DEPARTMENT_OTHER): Payer: Self-pay

## 2022-08-08 ENCOUNTER — Emergency Department (HOSPITAL_BASED_OUTPATIENT_CLINIC_OR_DEPARTMENT_OTHER): Payer: Medicare Other

## 2022-08-08 ENCOUNTER — Ambulatory Visit: Payer: Medicare Other | Admitting: Podiatry

## 2022-08-08 ENCOUNTER — Emergency Department (HOSPITAL_BASED_OUTPATIENT_CLINIC_OR_DEPARTMENT_OTHER): Payer: Medicare Other | Admitting: Radiology

## 2022-08-08 DIAGNOSIS — K5730 Diverticulosis of large intestine without perforation or abscess without bleeding: Secondary | ICD-10-CM | POA: Insufficient documentation

## 2022-08-08 DIAGNOSIS — W19XXXA Unspecified fall, initial encounter: Secondary | ICD-10-CM | POA: Insufficient documentation

## 2022-08-08 DIAGNOSIS — S0990XA Unspecified injury of head, initial encounter: Secondary | ICD-10-CM

## 2022-08-08 DIAGNOSIS — Z7901 Long term (current) use of anticoagulants: Secondary | ICD-10-CM

## 2022-08-08 DIAGNOSIS — M545 Low back pain, unspecified: Secondary | ICD-10-CM | POA: Diagnosis not present

## 2022-08-08 DIAGNOSIS — S5002XA Contusion of left elbow, initial encounter: Secondary | ICD-10-CM | POA: Diagnosis not present

## 2022-08-08 DIAGNOSIS — M47816 Spondylosis without myelopathy or radiculopathy, lumbar region: Secondary | ICD-10-CM | POA: Diagnosis not present

## 2022-08-08 DIAGNOSIS — Z79899 Other long term (current) drug therapy: Secondary | ICD-10-CM | POA: Diagnosis not present

## 2022-08-08 DIAGNOSIS — B351 Tinea unguium: Secondary | ICD-10-CM | POA: Diagnosis not present

## 2022-08-08 DIAGNOSIS — S59902A Unspecified injury of left elbow, initial encounter: Secondary | ICD-10-CM | POA: Diagnosis not present

## 2022-08-08 DIAGNOSIS — I700 Atherosclerosis of aorta: Secondary | ICD-10-CM | POA: Diagnosis not present

## 2022-08-08 DIAGNOSIS — I2510 Atherosclerotic heart disease of native coronary artery without angina pectoris: Secondary | ICD-10-CM | POA: Insufficient documentation

## 2022-08-08 DIAGNOSIS — M79675 Pain in left toe(s): Secondary | ICD-10-CM | POA: Diagnosis not present

## 2022-08-08 DIAGNOSIS — M79674 Pain in right toe(s): Secondary | ICD-10-CM

## 2022-08-08 DIAGNOSIS — S3991XA Unspecified injury of abdomen, initial encounter: Secondary | ICD-10-CM | POA: Diagnosis not present

## 2022-08-08 DIAGNOSIS — R918 Other nonspecific abnormal finding of lung field: Secondary | ICD-10-CM | POA: Diagnosis not present

## 2022-08-08 DIAGNOSIS — M4856XA Collapsed vertebra, not elsewhere classified, lumbar region, initial encounter for fracture: Secondary | ICD-10-CM | POA: Diagnosis not present

## 2022-08-08 DIAGNOSIS — Z043 Encounter for examination and observation following other accident: Secondary | ICD-10-CM | POA: Diagnosis not present

## 2022-08-08 DIAGNOSIS — M5136 Other intervertebral disc degeneration, lumbar region: Secondary | ICD-10-CM | POA: Diagnosis not present

## 2022-08-08 LAB — CBC WITH DIFFERENTIAL/PLATELET
Abs Immature Granulocytes: 0.02 10*3/uL (ref 0.00–0.07)
Basophils Absolute: 0 10*3/uL (ref 0.0–0.1)
Basophils Relative: 0 %
Eosinophils Absolute: 0.2 10*3/uL (ref 0.0–0.5)
Eosinophils Relative: 2 %
HCT: 45.4 % (ref 39.0–52.0)
Hemoglobin: 14.7 g/dL (ref 13.0–17.0)
Immature Granulocytes: 0 %
Lymphocytes Relative: 19 %
Lymphs Abs: 1.9 10*3/uL (ref 0.7–4.0)
MCH: 30.1 pg (ref 26.0–34.0)
MCHC: 32.4 g/dL (ref 30.0–36.0)
MCV: 92.8 fL (ref 80.0–100.0)
Monocytes Absolute: 1 10*3/uL (ref 0.1–1.0)
Monocytes Relative: 10 %
Neutro Abs: 6.7 10*3/uL (ref 1.7–7.7)
Neutrophils Relative %: 69 %
Platelets: 176 10*3/uL (ref 150–400)
RBC: 4.89 MIL/uL (ref 4.22–5.81)
RDW: 14.3 % (ref 11.5–15.5)
WBC: 9.8 10*3/uL (ref 4.0–10.5)
nRBC: 0 % (ref 0.0–0.2)

## 2022-08-08 LAB — BASIC METABOLIC PANEL
Anion gap: 8 (ref 5–15)
BUN: 22 mg/dL (ref 8–23)
CO2: 28 mmol/L (ref 22–32)
Calcium: 9.8 mg/dL (ref 8.9–10.3)
Chloride: 103 mmol/L (ref 98–111)
Creatinine, Ser: 1.28 mg/dL — ABNORMAL HIGH (ref 0.61–1.24)
GFR, Estimated: 53 mL/min — ABNORMAL LOW (ref >60)
Glucose, Bld: 105 mg/dL — ABNORMAL HIGH (ref 70–99)
Potassium: 4.2 mmol/L (ref 3.5–5.1)
Sodium: 139 mmol/L (ref 135–145)

## 2022-08-08 LAB — SEDIMENTATION RATE: Sed Rate: 20 mm/hr — ABNORMAL HIGH (ref 0–16)

## 2022-08-08 LAB — PROTIME-INR
INR: 3.6 — ABNORMAL HIGH (ref 0.8–1.2)
Prothrombin Time: 35.4 seconds — ABNORMAL HIGH (ref 11.4–15.2)

## 2022-08-08 LAB — C-REACTIVE PROTEIN: CRP: 0.7 mg/dL (ref <1.0)

## 2022-08-08 MED ORDER — METOPROLOL TARTRATE 25 MG PO TABS
50.0000 mg | ORAL_TABLET | Freq: Once | ORAL | Status: AC
  Administered 2022-08-08: 50 mg via ORAL
  Filled 2022-08-08: qty 2

## 2022-08-08 MED ORDER — ACETAMINOPHEN 325 MG PO TABS
650.0000 mg | ORAL_TABLET | Freq: Once | ORAL | Status: AC
  Administered 2022-08-08: 650 mg via ORAL
  Filled 2022-08-08: qty 2

## 2022-08-08 MED ORDER — METOPROLOL TARTRATE 25 MG PO TABS
25.0000 mg | ORAL_TABLET | Freq: Once | ORAL | Status: AC
  Administered 2022-08-08: 25 mg via ORAL
  Filled 2022-08-08: qty 1

## 2022-08-08 MED ORDER — IOHEXOL 300 MG/ML  SOLN
100.0000 mL | Freq: Once | INTRAMUSCULAR | Status: AC | PRN
  Administered 2022-08-08: 100 mL via INTRAVENOUS

## 2022-08-08 MED ORDER — ACETAMINOPHEN 325 MG PO TABS
325.0000 mg | ORAL_TABLET | Freq: Once | ORAL | Status: DC
  Filled 2022-08-08: qty 1

## 2022-08-08 MED ORDER — DICLOFENAC SODIUM 1 % EX GEL: 4.0000 g | Freq: Four times a day (QID) | CUTANEOUS | 0 refills | Status: DC

## 2022-08-08 MED ORDER — OXYCODONE HCL 5 MG PO TABS
2.5000 mg | ORAL_TABLET | Freq: Once | ORAL | Status: DC
  Filled 2022-08-08: qty 1

## 2022-08-08 NOTE — Discharge Instructions (Addendum)
Call your doctor in the morning and discussed with them your visit here.  They might want to see you in the office to talk about physical therapy or making your home more safe.  They may also review your medications and make sure there is nothing that needs to be changed.  There is concerned that your back might have a worsening fracture.  You are placed in the corset.  This hopefully will provide you some more stability and may help you with discomfort.  I prescribed you a gel that can help you with the pain as well.  Use the gel as prescribed Also take tylenol '1000mg'$ (2 extra strength) four times a day.

## 2022-08-08 NOTE — ED Notes (Signed)
Ranette, RN notified of pt's HR. EKG performed.

## 2022-08-08 NOTE — ED Notes (Signed)
Patient transported to CT 

## 2022-08-08 NOTE — Progress Notes (Signed)
Subjective: 88 y.o. returns the office today for painful, elongated, thickened toenails which he cannot trim himself.  No swelling redness or injury to the toenail sites.  After the last appointment he had ingrown toenails of the hallux he states it took several weeks for this to heal.  Currently denies any swelling or redness or any drainage to the toenail sites.  No other concerns today.    He is on Coumadin  PCP: [name], MD  Objective: AAO 3, NAD DP/PT pulses palpable, CRT less than 3 seconds Nails hypertrophic, dystrophic, elongated, brittle, discolored 10.  Nails still are discolored yellow-brown discoloration with subungual debris.  Incurvation present the hallux toenail left side worse than right.  No edema, erythema or signs of infection of the toenail sites. No open lesions or pre-ulcerative lesions are identified. No pain with calf compression, swelling, warmth, erythema.  Assessment: Patient presents with symptomatic onychomycosis; chronic anticoagulation  Plan: -Treatment options including alternatives, risks, complications were discussed -Nails sharply debrided 10 without complication/bleeding.  There is incurvation of the hallux toenails.  I was able to debride the nails without any bleeding or complications patient comfort. -Discussed daily foot inspection. If there are any changes, to call the office immediately.  -Follow-up in 3 months or sooner if any problems are to arise. In the meantime, encouraged to call the office with any questions, concerns, changes symptoms.  Celesta Gentile, DPM

## 2022-08-08 NOTE — ED Provider Notes (Signed)
I see the patient in signout from Dr. Wyvonnia Dusky, ` briefly the patient is a 88 year old male with a chief complaints of a nonsyncopal fall.  Complaining of back pain.  Awaiting CT imaging.  CT of the chest abdomen pelvis without obvious acute pathology.  Dedicated L-spine reformats radiology concern for may be acute on chronic compression fracture with also some concern for discitis or osteomyelitis.  Seems less likely to be infectious in etiology based on history, will discuss with neurosurgery.  I discussed case with Dr. Ronnald Ramp, neurosurgery.  He independently reviewed the patient's imaging.  Thinks that the findings commented on by the radiologist are likely not acute.  Felt okay for PCP follow-up.  He did recommend a lumbar corset with the worsening compression.  I discussed this with the patient and family.  Willing to wait a bit longer to see if they get the corset on.  Of note the patient went into A-fib with RVR while waiting in the room.  He missed his home metoprolol dose.  Was given his home metoprolol dose and now is rate controlled again.   Deno Etienne, DO 08/08/22 2013

## 2022-08-08 NOTE — ED Notes (Signed)
Pt's heart rate has increased  from 70s to 130s sinus tach, c/o left arm pain  12 lead obtained and EDP made aware

## 2022-08-08 NOTE — ED Notes (Signed)
Patient transported to CT/Xray. 

## 2022-08-08 NOTE — ED Notes (Signed)
Pt verbalizes understanding of all d/c instructions

## 2022-08-08 NOTE — ED Provider Notes (Signed)
[facility] EMERGENCY DEPT Provider Note   CSN: [id number] Arrival date & time: 08/08/22  1309     History  Chief Complaint  Patient presents with   Lytle Michaels    Chad [name] is a 88 y.o. male.  Patient here with fall and head injury and back pain.  Son witnessed the fall.  States the patient was trying to grab his glasses and lost his balance and fell striking his head on the wall about 9 AM.  He does take Coumadin for history of atrial fibrillation.  Complains of pain to his head, low back and left elbow.  Does have chronic low back pain at baseline for which she takes Tylenol but is slightly worse now.  Denies any neck pain.  Denies any chest pain or shortness of breath.  No abdominal pain. Denies any preceding dizziness or weakness or syncope. Able to carry on arrival with walker.  Does not think he lost consciousness.  No vomiting.  Acting at baseline per son at bedside  The history is provided by the patient and the spouse.  Fall Associated symptoms include headaches. Pertinent negatives include no shortness of breath.       Home Medications Prior to Admission medications   Medication Sig Start Date End Date Taking? Authorizing Provider  Cholecalciferol (VITAMIN D3) 2000 units capsule Take 2,000 Units by mouth 2 (two) times daily.    [provider]  diltiazem (CARDIZEM) 30 MG tablet Take 1 tablet every 8 hours as needed 04/29/22   Minus Breeding, MD  dofetilide [facility]) 250 MCG capsule Take 1 capsule by mouth twice daily 06/14/22   Minus Breeding, MD  escitalopram (LEXAPRO) 10 MG tablet Take 10 mg by mouth at bedtime.    [provider]  furosemide (LASIX) 20 MG tablet Take 1 tablet by mouth twice daily 07/15/22   Minus Breeding, MD  irbesartan (AVAPRO) 300 MG tablet Take 1 tablet by mouth once daily 06/14/22   Minus Breeding, MD  metoprolol succinate (TOPROL-XL) 50 MG 24 hr tablet Take 1.5 tablets (75 mg total) by mouth 2 (two) times daily.  12/17/21   Deboraha Sprang, MD  Multiple Vitamins-Minerals (CENTRUM SILVER PO) Take 1 tablet by mouth daily.    [provider]  mupirocin ointment (BACTROBAN) 2 % Apply topically 2 (two) times daily. 05/24/21   [provider]  OXYGEN Inhale 2 L into the lungs at bedtime. Uses with CPAP machine    [provider]  triamcinolone ointment (KENALOG) 0.1 % Apply 1 application topically 2 (two) times daily. 06/08/21   [provider]  vitamin B-12 (CYANOCOBALAMIN) 500 MCG tablet Take 500 mcg by mouth 2 (two) times daily.    [provider]  warfarin (COUMADIN) 5 MG tablet TAKE 1/2 TO 1 (ONE-HALF TO ONE) TABLET BY MOUTH ONCE DAILY  AS  DIRECTED  BY  COUMADIN  CLINIC 05/10/22   Minus Breeding, MD      Allergies    Doxycycline, Amiodarone, Carbapenems, Cephalexin, Cephalosporins, Chlorpheniramine, Ciprofloxacin, Decongestant [oxymetazoline], Diltiazem, Flagyl [metronidazole], Hctz [hydrochlorothiazide], Levaquin [levofloxacin in d5w], Levofloxacin, Lidocaine, Penicillins, Teline [tetracycline], Tetanus toxoid, Z-pak [azithromycin], Sulfonamide derivatives, and Toprol xl [metoprolol tartrate]    Review of Systems   Review of Systems  Respiratory:  Negative for cough, chest tightness and shortness of breath.   Gastrointestinal:  Negative for nausea and vomiting.  Musculoskeletal:  Positive for arthralgias and myalgias.  Neurological:  Positive for headaches.   all other systems are negative except as  noted in the HPI and PMH.    Physical Exam Updated Vital Signs BP 113/87   Pulse 61   Temp 97.8 F (36.6 C)   Resp 17   Ht '5\' 9"'$  (1.753 m)   Wt 112 kg   SpO2 92%   BMI 36.46 kg/m  Physical Exam Vitals and nursing note reviewed.  Constitutional:      General: He is not in acute distress.    Appearance: He is well-developed. He is not ill-appearing.  HENT:     Head: Normocephalic and atraumatic.     Mouth/Throat:     Pharynx: No oropharyngeal  exudate.  Eyes:     Conjunctiva/sclera: Conjunctivae normal.     Pupils: Pupils are equal, round, and reactive to light.  Neck:     Comments: No midline C-spine tenderness. Cardiovascular:     Rate and Rhythm: Normal rate and regular rhythm.     Heart sounds: Normal heart sounds. No murmur heard. Pulmonary:     Effort: Pulmonary effort is normal. No respiratory distress.     Breath sounds: Normal breath sounds.  Chest:     Chest wall: No tenderness.  Abdominal:     Palpations: Abdomen is soft.     Tenderness: There is no abdominal tenderness. There is no guarding or rebound.  Musculoskeletal:        General: Tenderness present. Normal range of motion.     Cervical back: Normal range of motion.     Comments: Hematoma and ecchymosis to left elbow.  No bony tenderness.  Midline lumbar spine tenderness, no step-offs.  Skin:    General: Skin is warm.     Capillary Refill: Capillary refill takes less than 2 seconds.  Neurological:     General: No focal deficit present.     Mental Status: He is alert and oriented to person, place, and time. Mental status is at baseline.     Cranial Nerves: No cranial nerve deficit.     Motor: No abnormal muscle tone.     Coordination: Coordination normal.     Comments:  5/5 strength throughout. CN 2-12 intact.Equal grip strength.   Psychiatric:        Behavior: Behavior normal.     ED Results / Procedures / Treatments   Labs (all labs ordered are listed, but only abnormal results are displayed) Labs Reviewed  BASIC METABOLIC PANEL - Abnormal; Notable for the following components:      Result Value   Glucose, Bld 105 (*)    Creatinine, Ser 1.28 (*)    GFR, Estimated 53 (*)    All other components within normal limits  PROTIME-INR - Abnormal; Notable for the following components:   Prothrombin Time 35.4 (*)    INR 3.6 (*)    All other components within normal limits  CBC WITH DIFFERENTIAL/PLATELET    EKG EKG  Interpretation  Date/Time:  Monday August 08 2022 14:28:15 EDT Ventricular Rate:  68 PR Interval:    QRS Duration: 141 QT Interval:  432 QTC Calculation: 460 R Axis:   63 Text Interpretation: Atrial fibrillation Right bundle branch block Abnormal T, consider ischemia, lateral leads No significant change was found Confirmed by Ezequiel Essex [phone]) on 08/08/2022 2:34:26 PM  Radiology DG Elbow Complete Left  Result Date: 08/08/2022 CLINICAL DATA:  Trauma, fall EXAM: LEFT ELBOW - COMPLETE 3+ VIEW COMPARISON:  None Available. FINDINGS: No fracture or dislocation is seen. There is no significant displacement of posterior fat pad. Small bony spurs  are noted in the coronoid process of proximal ulna. IMPRESSION: No recent fracture or dislocation is seen in left elbow. Electronically Signed   By: Elmer Picker M.D.   On: 08/08/2022 14:34   CT Cervical Spine Wo Contrast  Result Date: 08/08/2022 CLINICAL DATA:  Fall EXAM: CT CERVICAL SPINE WITHOUT CONTRAST TECHNIQUE: Multidetector CT imaging of the cervical spine was performed without intravenous contrast. Multiplanar CT image reconstructions were also generated. RADIATION DOSE REDUCTION: This exam was performed according to the departmental dose-optimization program which includes automated exposure control, adjustment of the mA and/or kV according to patient size and/or use of iterative reconstruction technique. COMPARISON:  None Available. FINDINGS: Alignment: Straightening of the normal cervical lordosis. Trace anterolisthesis of C3 on C4. Skull base and vertebrae: No evidence of vertebral body height loss. Skull base is unremarkable. Soft tissues and spinal canal: No prevertebral fluid or swelling. No visible canal hematoma. Disc levels: There are multilevel severe facet degenerative changes throughout the cervical spine, right-greater-than-left. There are lower lumbar spine predominant degenerative disc disease. Upper chest: Negative. Other:  Rounded structure in the lower left neck measuring up to 1.3 cm (series 4, image 66) favored to be vascular. IMPRESSION: No CT evidence of cervical spine fracture. Electronically Signed   By: Marin Roberts M.D.   On: 08/08/2022 14:28   CT Head Wo Contrast  Result Date: 08/08/2022 CLINICAL DATA:  Fall EXAM: CT HEAD WITHOUT CONTRAST TECHNIQUE: Contiguous axial images were obtained from the base of the skull through the vertex without intravenous contrast. RADIATION DOSE REDUCTION: This exam was performed according to the departmental dose-optimization program which includes automated exposure control, adjustment of the mA and/or kV according to patient size and/or use of iterative reconstruction technique. COMPARISON:  CT head 02/20/2017 FINDINGS: Brain: No evidence of acute infarction, hemorrhage, hydrocephalus, extra-axial collection or mass lesion/mass effect. Vascular: No hyperdense vessel or unexpected calcification. Skull: Normal. Negative for fracture or focal lesion. Sinuses/Orbits: Bilateral lens replacements. Other: None. IMPRESSION: No CT evidence of intracranial injury. Electronically Signed   By: Marin Roberts M.D.   On: 08/08/2022 14:20    Procedures Procedures    Medications Ordered in ED Medications  acetaminophen (TYLENOL) tablet 650 mg (650 mg Oral Given 08/08/22 1438)    ED Course/ Medical Decision Making/ A&P                           Medical Decision Making Amount and/or Complexity of Data Reviewed Labs: ordered. Decision-making details documented in ED Course. Radiology: ordered and independent interpretation performed. Decision-making details documented in ED Course. ECG/medicine tests: ordered and independent interpretation performed. Decision-making details documented in ED Course.  Risk OTC drugs. Prescription drug management.  Fall on anticoagulation with head injury and back pain.  Vital stable, no distress, neurologically intact.  CT head and C-spine are  negative for acute traumatic injury.  No hemorrhage.  Given patient's back pain as well as falls on Coumadin will obtain additional imaging of his lumbar spine and abdomen and pelvis.  INR 3.6. hemoglobin stable,  CT of spine and abdomen and pelvis pending at shift change. Does have back pain at baseline but this is worse since his fall. Ongoing numbness to R toes is chronic. No new numbness, tingling, weakness.  Additional imaging pending at shift change. Anticipate discharge if reassuring. Dr. Tyrone Nine to assume care.         Final Clinical Impression(s) / ED Diagnoses Final diagnoses:  Fall, initial  encounter  Head injury    Rx / DC Orders ED Discharge Orders     None         Sho Salguero, Annie Main, MD 08/08/22 1655

## 2022-08-08 NOTE — ED Notes (Signed)
Services contacted for Orthopedic Device.

## 2022-08-08 NOTE — ED Notes (Signed)
Informed pt and son that the ortho tech @ [facility] has been notified re:  lumbar corset Al so informed he would be safer and spine would be more stabilized.  Pt verbalizes understanding

## 2022-08-08 NOTE — ED Triage Notes (Signed)
Patient here POV from Home.  Endorses Falling this AM. States he reached to grab his Glasses and Lost his Balance and Fell at approximately 0900.  Takes Coumadin. Soreness to Left Elbow and Head Injury to Wall. No LOC.  NAD Noted during Triage. A&Ox4. GCS 15. Ambulatory with Personal Gilford Rile.

## 2022-08-08 NOTE — ED Notes (Signed)
Pt does not want Oxyir, states he does nothing for him but make him feel funny, will return to pyxis

## 2022-08-10 ENCOUNTER — Telehealth: Payer: Self-pay | Admitting: Cardiology

## 2022-08-10 ENCOUNTER — Telehealth (HOSPITAL_COMMUNITY): Payer: Self-pay | Admitting: *Deleted

## 2022-08-10 NOTE — Telephone Encounter (Signed)
Spoke  to daughter.  Daughter states her father just awaken after speaking to Nurse, adult at  [facility]. Patient took  pulse with  pulse oximetry heart rate in 125.  Daughter states he  went into Afib @ 2  -3 am this morning.  Patient had an episode on Monday but it did not last long - (He was at the ER for a fall he  Daughter was concerned what to do now?      Patient can take a another dose of  Diltiazem 8 hours after the last dose, which was 2 pm per daughter.  He can also take his regular dose of metoprolol. If asymptomatic  recommend daughter take patient to closet ER and /or call back to the  on-call  doctor or APP.   If patient is not symptomatic can wait  to talk  to [facility] tomorrow.    Daughter verbalized understanding

## 2022-08-10 NOTE — Telephone Encounter (Signed)
Left message

## 2022-08-10 NOTE — Telephone Encounter (Signed)
Pt feeling some improvement. If still having afib tomorrow he will call me back.

## 2022-08-10 NOTE — Telephone Encounter (Signed)
Freeze capture appears to be afib.  Appears V rates are variable.

## 2022-08-10 NOTE — Telephone Encounter (Signed)
I did not need this encounter. °

## 2022-08-10 NOTE — Telephone Encounter (Signed)
Patient called in stating he feels to be in afib with elevated rates. He used PRN cardizem this morning and "breathing exercises" to help but does not feel as though it is working to convert him as in the past.  Pt to send transmission to see if persistent or not - pt was in the ER earlier in week due to fall and was in afib at the time as well. Will call back daughter once transmission report known.

## 2022-08-10 NOTE — Telephone Encounter (Signed)
STAT if HR is under 50 or over 120 (normal HR is 60-100 beats per minute)  What is your heart rate? 125  Do you have a log of your heart rate readings (document readings)? Pt's daughter states it is all over the place, but the more recent one is staying stable currently   Do you have any other symptoms? See phone note from Afib clinic today 09/27

## 2022-08-11 ENCOUNTER — Ambulatory Visit (HOSPITAL_COMMUNITY)
Admission: RE | Admit: 2022-08-11 | Discharge: 2022-08-11 | Disposition: A | Discharge status: Home / Self Care | Payer: Medicare Other | Source: Ambulatory Visit | Attending: Physician Assistant | Admitting: Physician Assistant

## 2022-08-11 VITALS — BP 86/56 | HR 119 | Ht 69.0 in | Wt 248.4 lb

## 2022-08-11 DIAGNOSIS — I4510 Unspecified right bundle-branch block: Secondary | ICD-10-CM | POA: Insufficient documentation

## 2022-08-11 DIAGNOSIS — I5032 Chronic diastolic (congestive) heart failure: Secondary | ICD-10-CM | POA: Diagnosis not present

## 2022-08-11 DIAGNOSIS — I495 Sick sinus syndrome: Secondary | ICD-10-CM | POA: Insufficient documentation

## 2022-08-11 DIAGNOSIS — I110 Hypertensive heart disease with heart failure: Secondary | ICD-10-CM | POA: Insufficient documentation

## 2022-08-11 DIAGNOSIS — Z950 Presence of cardiac pacemaker: Secondary | ICD-10-CM | POA: Diagnosis not present

## 2022-08-11 DIAGNOSIS — Z9981 Dependence on supplemental oxygen: Secondary | ICD-10-CM | POA: Insufficient documentation

## 2022-08-11 DIAGNOSIS — G4733 Obstructive sleep apnea (adult) (pediatric): Secondary | ICD-10-CM | POA: Insufficient documentation

## 2022-08-11 DIAGNOSIS — I4892 Unspecified atrial flutter: Secondary | ICD-10-CM | POA: Insufficient documentation

## 2022-08-11 DIAGNOSIS — J849 Interstitial pulmonary disease, unspecified: Secondary | ICD-10-CM | POA: Insufficient documentation

## 2022-08-11 DIAGNOSIS — I484 Atypical atrial flutter: Secondary | ICD-10-CM | POA: Diagnosis not present

## 2022-08-11 DIAGNOSIS — D6869 Other thrombophilia: Secondary | ICD-10-CM | POA: Diagnosis not present

## 2022-08-11 DIAGNOSIS — Z79899 Other long term (current) drug therapy: Secondary | ICD-10-CM | POA: Insufficient documentation

## 2022-08-11 DIAGNOSIS — R9431 Abnormal electrocardiogram [ECG] [EKG]: Secondary | ICD-10-CM | POA: Insufficient documentation

## 2022-08-11 DIAGNOSIS — Z7901 Long term (current) use of anticoagulants: Secondary | ICD-10-CM | POA: Insufficient documentation

## 2022-08-11 DIAGNOSIS — I4819 Other persistent atrial fibrillation: Secondary | ICD-10-CM | POA: Diagnosis not present

## 2022-08-11 DIAGNOSIS — Z87891 Personal history of nicotine dependence: Secondary | ICD-10-CM | POA: Insufficient documentation

## 2022-08-11 DIAGNOSIS — I959 Hypotension, unspecified: Secondary | ICD-10-CM | POA: Diagnosis not present

## 2022-08-11 MED ORDER — METOPROLOL SUCCINATE ER 50 MG PO TB24: 100.0000 mg | ORAL_TABLET | Freq: Two times a day (BID) | ORAL | 3 refills | Status: DC

## 2022-08-11 NOTE — Progress Notes (Signed)
Patient ID: TJ KITCHINGS, male   DOB: March 10, 1929, 88 y.o.   MRN: [id number]     Primary Care Physician: Redmond School, MD Referring Physician: [facility] f/u Primary Cardiologist: Dr Percival Spanish Primary EP: Dr Rosetta Posner is a 88 y.o. 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 He was  started on tikosyn 9/16 and was seen in the afib clinc for f/u in January. He was in SR, no complaints. He had prolonged qtc that day, reviewed with Dr. Caryl Comes and he felt QTC was ok. EKG repeated one week and qtc acceptable.Pt was not having any  afib that pt was aware of. He had been feeling well. He denies any new drugs or change in previous drugs. No change in general health.  He was seen in the afib clinic today, 3/16 due to complaints of having vision dim for a few seconds at a time. He feels liks he could pass out. No syncope. Started a couple of weeks ago. He can be sitting or standing. He is not aware of any sensation of heart racing or skipping with episodes. He was aware of heart racing, for short period of time last night but did not have any episodes of vision dimming with it. He has been instructed not to drive until the origin of these episodes can be further determined.30 day monitor was placed. Within a few days of wearing the monitor, an episode of elevated heart beat was noted, afib with RVR. Metoprolol was added at 25 mg a day and for the remainder of the monitor, there was no further elevated heart rate or episodes of feeling like he would pass out. No significant brady or pauses.   Returns 8/8 and feels well. No episodes of afib that he is aware of and no further episodes of RVR causing presyncopal rhythms, since starting back on metoprolol ER 25 mg a day. HR is office is 52 bpm, but he is not symptomatic with this. Has some issues with peripheral neuropathy but no falls. Continues on tikosyn bid and is compliant with  warfarin.  F/u 12/21. He reports that he has had some lightheaded spells but has not had any syncope. He just finished wearing an event monitor for these symptoms, placed by Dr.Hochrein, and pt will mail back today. Last time pt had these symptoms, monitor showed brief episodes of rapid afib and rate control was increased. He continues on Tikosyn with warfarin.  F/u 3/22. He has felt well. He had one episode of tachycardia but was associated with climbing 8 flights of stairs. No further dizziness spells. Last time he saw Dr. Caryl Comes, he talked to Dr. Percival Spanish re possibly stopping dofetilide and possibility of a PPM. Pt does f/u with Dr. Caryl Comes 4/9 at which time this will be further discussed. Pt was leaving for [facility] right after that appointment.He is slow today at 48 bpm.  F/u in afib clinic 08/04/20. I have not seen pt since 2018. He is here as he had some elevated HR last week. He saw his PCP who increased his metoprolol succinate from  50 mg bid to 100 mg bid last Wednesday. He has not seen any elevated HR since then. He appears to be tolerating this increased dose. He continues on dofetilide 250 mcg bid. He saw Dr. Percival Spanish in August with increased shortness of breath and pedal edema. His lasix was increased. He was found toan URI at that  time.  He was not having any  arrhythmia then. Recent echo showed normal EF, but technically challenging study. Today, he feels well. He is in an a paced rhythm. He remains on warfarin  for a CHA2DS2VASc of at least 4.   F/u in afib clinic, 03/04/21. He was referred by Dr. Rosezella [place] office for sensation of palpitations and feeling unwell for a few days this week, seeing HR's up to 155 bpm at night.. He states that he notices this when he lies down to sleep and will make his left arm hurt if it is sustained. He states for several hours last night,  he felt his heart racing but today, his ekg  shows atrial paced with PAC's. Device interrogated and interrupted by [employer]  and Mickel Baas  can only see a HR of 155 bpm x 44 sec's last night. The last sustained elevate HR was atrial tach at 150-160 April 11th. Possibly afib at 150 bpm for 8-9 hours March 22 nd. They will lower the detection rate to 120 as they feel the current  detection limit at 145 bpm is possibly not capturing a lot of what pt is feeling. His BB was reduced back in December as it was felt that he was doing well, so plan to increase BB  today. He continues on dofetilide 250 mcg bid with stable qt.   F/u in afib clinic, as pt noted symptoms of afib since Monday and device clinic confirmed he was in afib and referred to afib clinic. Fortunately, today, he is back in SR. No recent trigger identified. He is complaint with tikosyn 250 mcg bid. He is now a paced by EKG. Otherwise, no complaints. He is on warfarin.   Follow up in the AF clinic 08/11/22. Patient presented to the ED after a fall 08/08/22. CT of head showed no acute pathology. He was noted to have vertebral compression fractures but this were felt to be chronic in nature. He was incidentally found to be in rapid atrial flutter. His home metoprolol was given which rate controlled him. He states that his heart rates have been "up and down" since then. He is in atrial flutter today with increased SOB and fatigue.   Today, he denies symptoms of palpitations, chest pain, orthopnea, PND, lower extremity edema, dizziness, presyncope, syncope, or neurologic sequela. The patient is tolerating medications without difficulties and is otherwise without complaint today.   Past Medical History:  Diagnosis Date   Bladder cancer ([place]) 2010   Chronic bronchitis ([place])    Hypertension    Hypoglycemia    On home oxygen therapy    "2L w/CPAP at night" (06/21/2017)   OSA on CPAP    "w/2L O2 at night" (06/21/2017)   Persistent atrial fibrillation ([place])        Pneumonia    "when he was young; again in 2016" (06/21/2017)   Presence of permanent cardiac pacemaker    Sensory  ataxia    Past Surgical History:  Procedure Laterality Date   BACK SURGERY     CARDIOVERSION N/A 04/23/2015   Procedure: CARDIOVERSION;  Surgeon: [name], MD;  Location: [place];  Service: Cardiovascular;  Laterality: N/A;   CARDIOVERSION N/A 07/30/2015   Procedure: CARDIOVERSION;  Surgeon: [name], MD;  Location[facility] ENDOSCOPY;  Service: Cardiovascular;  Laterality: N/A;   EYE SURGERY Bilateral 2009   "to lower pressure; no glaucoma" (06/21/2017)   INSERT / REPLACE / REMOVE PACEMAKER  06/21/2017   LUMBAR [place]  1987   PACEMAKER IMPLANT N/A 06/21/2017   Procedure: Pacemaker Implant;  Surgeon: Deboraha Sprang, MD;  Location: [place] CV LAB;  Service: Cardiovascular;  Laterality: N/A;   TRANSURETHRAL RESECTION OF BLADDER TUMOR WITH GYRUS (TURBT-GYRUS)  07/10/2009    Cystourethroscopy, gyrus TURBT (transurethral  resection of bladder tumor).[name] 03/16/2011    Current Outpatient Medications  Medication Sig Dispense Refill   Cholecalciferol (VITAMIN D3) 2000 units capsule Take 2,000 Units by mouth 2 (two) times daily.     diclofenac Sodium (VOLTAREN) 1 % GEL Apply 4 g topically 4 (four) times daily. 100 g 0   diltiazem (CARDIZEM) 30 MG tablet Take 1 tablet every 8 hours as needed 90 tablet 3   dofetilide (TIKOSYN) 250 MCG capsule Take 1 capsule by mouth twice daily 180 capsule 3   escitalopram (LEXAPRO) 10 MG tablet Take 10 mg by mouth at bedtime.     furosemide (LASIX) 20 MG tablet Take 1 tablet by mouth twice daily 180 tablet 2   irbesartan (AVAPRO) 300 MG tablet Take 1 tablet by mouth once daily 90 tablet 3   metoprolol succinate (TOPROL-XL) 50 MG 24 hr tablet Take 1.5 tablets (75 mg total) by mouth 2 (two) times daily. 270 tablet 3   Multiple Vitamins-Minerals (CENTRUM SILVER PO) Take 1 tablet by mouth daily.     mupirocin ointment (BACTROBAN) 2 % Apply topically 2 (two) times daily.     OXYGEN Inhale 2 L into the lungs at bedtime. Uses with CPAP machine      triamcinolone ointment (KENALOG) 0.1 % Apply 1 application topically 2 (two) times daily.     vitamin B-12 (CYANOCOBALAMIN) 500 MCG tablet Take 500 mcg by mouth 2 (two) times daily.     warfarin (COUMADIN) 5 MG tablet TAKE 1/2 TO 1 (ONE-HALF TO ONE) TABLET BY MOUTH ONCE DAILY  AS  DIRECTED  BY  COUMADIN  CLINIC 90 tablet 0   No current facility-administered medications for this encounter.    Allergies  Allergen Reactions   Doxycycline Hives   Amiodarone     Caused neuropathy   Carbapenems    Cephalexin Other (See Comments)   Cephalosporins Other (See Comments)   Chlorpheniramine Other (See Comments)    Swells prostate Swells prostate   Ciprofloxacin     Pt can not take with amiodarone    Decongestant [Oxymetazoline]     All decongestant - makes prostate swell   Diltiazem Swelling    Left leg swelling   Flagyl [Metronidazole] Nausea Only    Stomach ache   Hctz [Hydrochlorothiazide]    Levaquin [Levofloxacin In D5w] Other (See Comments)    unknown   Levofloxacin Nausea Only   Lidocaine     Pt unsure of   Penicillins Swelling    Has patient had a PCN reaction causing immediate rash, facial/tongue/throat swelling, SOB or lightheadedness with hypotension: No Has patient had a PCN reaction causing severe rash involving mucus membranes or skin necrosis: No Has patient had a PCN reaction that required hospitalization: No Has patient had a PCN reaction occurring within the last 10 years: No If all of the above answers are "NO", then may proceed with Cephalosporin use. Swelling at injection site   Teline [Tetracycline] Swelling    Hospitalization for 2 weeks, prostate swelling   Tetanus Toxoid Swelling    Swells arms, Please pre-medicate with benadryl.   Z-Pak [Azithromycin] Other (See Comments)    Pt can not take with Amiodarone    Sulfonamide Derivatives  Rash   Toprol Xl [Metoprolol Tartrate] Itching and Rash    Pt states he tolerates.    Social History   Socioeconomic  History   Marital status: Widowed    Spouse name: [name]   Number of children: 2   Years of education: 16   Highest education level: Not on file  Occupational History    Employer: RETIRED  Tobacco Use   Smoking status: Former    Packs/day: 0.50    Years: 20.00    Total pack years: 10.00    Types: Cigarettes    Quit date: 06/03/1971    Years since quitting: 51.2    Passive exposure: Past   Smokeless tobacco: Never  Vaping Use   Vaping Use: Never used  Substance and Sexual Activity   Alcohol use: No   Drug use: No   Sexual activity: Not on file  Other Topics Concern   Not on file  Social History Narrative   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   Patient has two children.   Patient is retired.   Patient has a college education.   Patient is right-handed.   Patient does not drink any caffeine.   Social Determinants of Health   Financial Resource Strain: Not on file  Food Insecurity: Not on file  Transportation Needs: Not on file  Physical Activity: Not on file  Stress: Not on file  Social Connections: Not on file  Intimate Partner Violence: Not on file    Family History  Problem Relation Age of Onset   Heart failure Father    Hypertension Father    Dementia Mother     ROS- All systems are reviewed and negative except as per the HPI above  [facility] Weights   08/11/22 1403  Weight: 112.7 kg     Physical Exam: Vitals:   08/11/22 1403  BP: (!) 86/56  Pulse: (!) 119  Weight: 112.7 kg  Height: '5\' 9"'$  (1.753 m)     GEN- The patient is a well appearing elderly male, alert and oriented x 3 today.   HEENT-head normocephalic, atraumatic, sclera clear, conjunctiva pink, hearing intact, trachea midline. Lungs- Clear to ausculation bilaterally, normal work of breathing Heart- irregular rate and rhythm, tachycardia, no murmurs, rubs or gallops  GI- soft, NT, ND, + BS Extremities- no clubbing, cyanosis, or edema MS- no significant deformity or  atrophy Skin- no rash or lesion Psych- euthymic mood, full affect Neuro- strength and sensation are intact   EKG-  Atypical atrial flutter with 2:1 block, RBBB Vent. rate 119 BPM PR interval * ms QRS duration 142 ms QT/QTcB 396/557 ms   Echo 07/27/20  1. Technically challenging study. Recommend use of echo contrast for  future studies.   2. Grossly normal LVEF, reduced sensitivity for focal wall motion  abnormalities given technical limitations.. Left ventricular ejection  fraction, by estimation, is 55 to 60%. The left ventricle has normal  function. The left ventricle has no regional wall motion abnormalities. Left ventricular diastolic parameters are indeterminate.   3. Right ventricular systolic function was not well visualized. The right  ventricular size is not well visualized. Tricuspid regurgitation signal is  inadequate for assessing PA pressure.   4. The mitral valve was not well visualized. Trivial mitral valve  regurgitation. No evidence of mitral stenosis. Moderate mitral annular  calcification.   5. The aortic valve was not well visualized. Aortic valve regurgitation  is not visualized.   Comparison(s): No significant  change from prior study.    CHA2DS2-VASc Score = 4  The patient's score is based upon: CHF History: 1 (HFpEF) HTN History: 1 Diabetes History: 0 Stroke History: 0 Vascular Disease History: 0 Age Score: 2 Gender Score: 0       ASSESSMENT AND PLAN: 1. Persistent Atrial Fibrillation/atrial flutter The patient's CHA2DS2-VASc score is 4, indicating a 4.8% annual risk of stroke.   Patient in symptomatic atrial flutter today.  Will have him hold irbesartan to allow more room in BP for rate control.  Increase Toprol to 100 mg BID. He did not want to arrange DCCV at this time, will see how he is on follow up. ED precautions discussed.  Continue dofetilide 250 mcg BID. QT stable. Continue diltiazem 30 mg q 4 hours for heart racing.  Continue  warfarin  2. Secondary Hypercoagulable State (ICD10:  D68.69) The patient is at significant risk for stroke/thromboembolism based upon his CHA2DS2-VASc Score of 4.  Continue Warfarin (Coumadin).   3. HTN Low today, med changes as above.   3. OSA The importance of adequate treatment of sleep apnea was discussed today in order to improve our ability to maintain sinus rhythm long term. Encouraged compliance with CPAP therapy.  4. Chronic HFpEF Fluid status appears stable today.  5. Sinus node dysfunction S/p PPM, followed by Dr Caryl Comes and the device clinic.  6. ILD Followed by Dr Lamonte Sakai Patient previously failed amiodarone.    Follow up with Roderic Palau next week.    [facility] [address] [place], [place] [address] [phone]

## 2022-08-11 NOTE — Patient Instructions (Signed)
Hold Irbesartan    Increase metoprolol to '100mg'$  twice a day

## 2022-08-12 ENCOUNTER — Telehealth: Payer: Self-pay | Admitting: Cardiology

## 2022-08-12 DIAGNOSIS — N1832 Chronic kidney disease, stage 3b: Secondary | ICD-10-CM | POA: Diagnosis not present

## 2022-08-12 DIAGNOSIS — E1129 Type 2 diabetes mellitus with other diabetic kidney complication: Secondary | ICD-10-CM | POA: Diagnosis not present

## 2022-08-12 DIAGNOSIS — I4892 Unspecified atrial flutter: Secondary | ICD-10-CM | POA: Diagnosis not present

## 2022-08-12 DIAGNOSIS — I10 Essential (primary) hypertension: Secondary | ICD-10-CM | POA: Diagnosis not present

## 2022-08-12 NOTE — Telephone Encounter (Signed)
Spoke to patient's daughter Curt Bears she wanted Dr.Hochrein to know father recently fell and went to [facility] ED.Stated head ct was ok he fractured 2 vertebrae in back.He went back in Afib.He saw Afib clinic yesterday and medications were adjusted.He has a return appointment 10/3.They talked about a cardioversion.She wanted Dr.Hochrein to review their plan and would like his opinion.Message sent to [place].

## 2022-08-12 NOTE — Telephone Encounter (Signed)
Patient's daughter would like to talk with Dr. Percival Spanish or nurse about patient

## 2022-08-15 NOTE — Telephone Encounter (Signed)
Spoke to patient's daughter Curt Bears Dr.Hochrein reviewed ED records.He will review Afib clinic notes as well.She stated father's heart rate slowed down this past weekend.They will keep afb clinic appointment as planned.

## 2022-08-16 ENCOUNTER — Ambulatory Visit (HOSPITAL_COMMUNITY)
Admission: RE | Admit: 2022-08-16 | Discharge: 2022-08-16 | Disposition: A | Discharge status: Home / Self Care | Payer: Medicare Other | Source: Ambulatory Visit | Attending: Nurse Practitioner | Admitting: Nurse Practitioner

## 2022-08-16 ENCOUNTER — Ambulatory Visit: Payer: Medicare Other | Attending: Cardiology | Admitting: *Deleted

## 2022-08-16 ENCOUNTER — Encounter (HOSPITAL_COMMUNITY): Payer: Self-pay | Admitting: Physician Assistant

## 2022-08-16 VITALS — BP 144/76 | HR 61 | Ht 69.0 in | Wt 253.0 lb

## 2022-08-16 DIAGNOSIS — Z5181 Encounter for therapeutic drug level monitoring: Secondary | ICD-10-CM

## 2022-08-16 DIAGNOSIS — I5032 Chronic diastolic (congestive) heart failure: Secondary | ICD-10-CM | POA: Insufficient documentation

## 2022-08-16 DIAGNOSIS — G4733 Obstructive sleep apnea (adult) (pediatric): Secondary | ICD-10-CM | POA: Insufficient documentation

## 2022-08-16 DIAGNOSIS — I4892 Unspecified atrial flutter: Secondary | ICD-10-CM | POA: Diagnosis not present

## 2022-08-16 DIAGNOSIS — Z950 Presence of cardiac pacemaker: Secondary | ICD-10-CM | POA: Diagnosis not present

## 2022-08-16 DIAGNOSIS — J849 Interstitial pulmonary disease, unspecified: Secondary | ICD-10-CM | POA: Diagnosis not present

## 2022-08-16 DIAGNOSIS — Z7901 Long term (current) use of anticoagulants: Secondary | ICD-10-CM | POA: Insufficient documentation

## 2022-08-16 DIAGNOSIS — I480 Paroxysmal atrial fibrillation: Secondary | ICD-10-CM

## 2022-08-16 DIAGNOSIS — I110 Hypertensive heart disease with heart failure: Secondary | ICD-10-CM | POA: Insufficient documentation

## 2022-08-16 DIAGNOSIS — I4819 Other persistent atrial fibrillation: Secondary | ICD-10-CM | POA: Diagnosis not present

## 2022-08-16 DIAGNOSIS — D6869 Other thrombophilia: Secondary | ICD-10-CM | POA: Diagnosis not present

## 2022-08-16 LAB — MAGNESIUM: Magnesium: 2.3 mg/dL (ref 1.7–2.4)

## 2022-08-16 LAB — POCT INR: INR: 2.2 (ref 2.0–3.0)

## 2022-08-16 MED ORDER — METOPROLOL SUCCINATE ER 100 MG PO TB24: 100.0000 mg | ORAL_TABLET | Freq: Two times a day (BID) | ORAL | 3 refills | Status: DC

## 2022-08-16 NOTE — Patient Instructions (Signed)
Continue warfarin 1/2 tablet daily except 1 tablet on Thursdays Recheck in 6 weeks

## 2022-08-16 NOTE — Progress Notes (Signed)
Patient ID: Chad [name], male   DOB: [birth year], 88 y.o.   MRN: [id number]     Primary Care Physician: Redmond School, MD Referring Physician: [facility] f/u Primary Cardiologist: Dr Percival Spanish Primary EP: Dr Rosetta Posner is a 88 y.o. 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 He was  started on tikosyn 9/16 and was seen in the afib clinc for f/u in January. He was in SR, no complaints. He had prolonged qtc that day, reviewed with Dr. Caryl Comes and he felt QTC was ok. EKG repeated one week and qtc acceptable.Pt was not having any  afib that pt was aware of. He had been feeling well. He denies any new drugs or change in previous drugs. No change in general health.  He was seen in the afib clinic today, 3/16 due to complaints of having vision dim for a few seconds at a time. He feels liks he could pass out. No syncope. Started a couple of weeks ago. He can be sitting or standing. He is not aware of any sensation of heart racing or skipping with episodes. He was aware of heart racing, for short period of time last night but did not have any episodes of vision dimming with it. He has been instructed not to drive until the origin of these episodes can be further determined.30 day monitor was placed. Within a few days of wearing the monitor, an episode of elevated heart beat was noted, afib with RVR. Metoprolol was added at 25 mg a day and for the remainder of the monitor, there was no further elevated heart rate or episodes of feeling like he would pass out. No significant brady or pauses.   Returns 8/8 and feels well. No episodes of afib that he is aware of and no further episodes of RVR causing presyncopal rhythms, since starting back on metoprolol ER 25 mg a day. HR is office is 52 bpm, but he is not symptomatic with this. Has some issues with peripheral neuropathy but no falls. Continues on tikosyn bid and is compliant with  warfarin.  F/u 12/21. He reports that he has had some lightheaded spells but has not had any syncope. He just finished wearing an event monitor for these symptoms, placed by Dr.Hochrein, and pt will mail back today. Last time pt had these symptoms, monitor showed brief episodes of rapid afib and rate control was increased. He continues on Tikosyn with warfarin.  F/u 3/22. He has felt well. He had one episode of tachycardia but was associated with climbing 8 flights of stairs. No further dizziness spells. Last time he saw Dr. Caryl Comes, he talked to Dr. Percival Spanish re possibly stopping dofetilide and possibility of a PPM. Pt does f/u with Dr. Caryl Comes 4/9 at which time this will be further discussed. Pt was leaving for [facility] right after that appointment.He is slow today at 48 bpm.  F/u in afib clinic 08/04/20. I have not seen pt since 2018. He is here as he had some elevated HR last week. He saw his PCP who increased his metoprolol succinate from  50 mg bid to 100 mg bid last Wednesday. He has not seen any elevated HR since then. He appears to be tolerating this increased dose. He continues on dofetilide 250 mcg bid. He saw Dr. Percival Spanish in August with increased shortness of breath and pedal edema. His lasix was increased. He was found toan URI at that  time.  He was not having any  arrhythmia then. Recent echo showed normal EF, but technically challenging study. Today, he feels well. He is in an a paced rhythm. He remains on warfarin  for a CHA2DS2VASc of at least 4.   F/u in afib clinic, 03/04/21. He was referred by Dr. Rosezella [place] office for sensation of palpitations and feeling unwell for a few days this week, seeing HR's up to 155 bpm at night.. He states that he notices this when he lies down to sleep and will make his left arm hurt if it is sustained. He states for several hours last night,  he felt his heart racing but today, his ekg  shows atrial paced with PAC's. Device interrogated and interrupted by [employer]  and Mickel Baas  can only see a HR of 155 bpm x 44 sec's last night. The last sustained elevate HR was atrial tach at 150-160 April 11th. Possibly afib at 150 bpm for 8-9 hours March 22 nd. They will lower the detection rate to 120 as they feel the current  detection limit at 145 bpm is possibly not capturing a lot of what pt is feeling. His BB was reduced back in December as it was felt that he was doing well, so plan to increase BB  today. He continues on dofetilide 250 mcg bid with stable qt.   F/u in afib clinic, as pt noted symptoms of afib since Monday and device clinic confirmed he was in afib and referred to afib clinic. Fortunately, today, he is back in SR. No recent trigger identified. He is complaint with tikosyn 250 mcg bid. He is now a paced by EKG. Otherwise, no complaints. He is on warfarin.   Follow up in the AF clinic 08/11/22. Patient presented to the ED after a fall 08/08/22. CT of head showed no acute pathology. He was noted to have vertebral compression fractures but this were felt to be chronic in nature. He was incidentally found to be in rapid atrial flutter. His home metoprolol was given which rate controlled him. He states that his heart rates have been "up and down" since then. He is in atrial flutter today with increased SOB and fatigue.   Follow up in the AF clinic 08/16/22. Patient reports that he converted to SR on the night of 9/28 and has stayed in SR since. He feels better with less fatigue or SOB. He is in SR today.   Today, he denies symptoms of palpitations, chest pain, orthopnea, PND, lower extremity edema, dizziness, presyncope, syncope, or neurologic sequela. The patient is tolerating medications without difficulties and is otherwise without complaint today.   Past Medical History:  Diagnosis Date   Bladder cancer ([place]) 2010   Chronic bronchitis ([place])    Hypertension    Hypoglycemia    On home oxygen therapy    "2L w/CPAP at night" (06/21/2017)   OSA on CPAP    "w/2L  O2 at night" (06/21/2017)   Persistent atrial fibrillation ([place])        Pneumonia    "when he was young; again in 2016" (06/21/2017)   Presence of permanent cardiac pacemaker    Sensory ataxia    Past Surgical History:  Procedure Laterality Date   BACK SURGERY     CARDIOVERSION N/A 04/23/2015   Procedure: CARDIOVERSION;  Surgeon: [name], MD;  Location: [place];  Service: Cardiovascular;  Laterality: N/A;   CARDIOVERSION N/A 07/30/2015   Procedure: CARDIOVERSION;  Surgeon: [name],  MD;  Location: [place];  Service: Cardiovascular;  Laterality: N/A;   EYE SURGERY Bilateral 2009   "to lower pressure; no glaucoma" (06/21/2017)   INSERT / REPLACE / REMOVE PACEMAKER  06/21/2017   LUMBAR [place] SURGERY  1987   PACEMAKER IMPLANT N/A 06/21/2017   Procedure: Pacemaker Implant;  Surgeon: Deboraha Sprang, MD;  Location: [place] CV [facility];  Service: Cardiovascular;  Laterality: N/A;   TRANSURETHRAL RESECTION OF BLADDER TUMOR WITH GYRUS (TURBT-GYRUS)  07/10/2009    Cystourethroscopy, gyrus TURBT (transurethral  resection of bladder tumor).[name] 03/16/2011    Current Outpatient Medications  Medication Sig Dispense Refill   Cholecalciferol (VITAMIN D3) 2000 units capsule Take 2,000 Units by mouth 2 (two) times daily.     diclofenac Sodium (VOLTAREN) 1 % GEL Apply 4 g topically 4 (four) times daily. 100 g 0   diltiazem (CARDIZEM) 30 MG tablet Take 1 tablet every 8 hours as needed 90 tablet 3   dofetilide (TIKOSYN) 250 MCG capsule Take 1 capsule by mouth twice daily 180 capsule 3   escitalopram (LEXAPRO) 10 MG tablet Take 10 mg by mouth at bedtime.     furosemide (LASIX) 20 MG tablet Take 1 tablet by mouth twice daily 180 tablet 2   irbesartan (AVAPRO) 300 MG tablet Take 1 tablet by mouth once daily 90 tablet 3   metoprolol succinate (TOPROL-XL) 50 MG 24 hr tablet Take 2 tablets (100 mg total) by mouth 2 (two) times daily. 270 tablet 3   Multiple Vitamins-Minerals (CENTRUM SILVER PO) Take 1  tablet by mouth daily.     mupirocin ointment (BACTROBAN) 2 % Apply topically 2 (two) times daily.     OXYGEN Inhale 2 L into the lungs at bedtime. Uses with CPAP machine     triamcinolone ointment (KENALOG) 0.1 % Apply 1 application topically 2 (two) times daily.     vitamin B-12 (CYANOCOBALAMIN) 500 MCG tablet Take 500 mcg by mouth 2 (two) times daily.     warfarin (COUMADIN) 5 MG tablet TAKE 1/2 TO 1 (ONE-HALF TO ONE) TABLET BY MOUTH ONCE DAILY  AS  DIRECTED  BY  COUMADIN  CLINIC 90 tablet 0   No current facility-administered medications for this encounter.    Allergies  Allergen Reactions   Doxycycline Hives   Amiodarone     Caused neuropathy   Carbapenems    Cephalexin Other (See Comments)   Cephalosporins Other (See Comments)   Chlorpheniramine Other (See Comments)    Swells prostate Swells prostate   Ciprofloxacin     Pt can not take with amiodarone    Decongestant [Oxymetazoline]     All decongestant - makes prostate swell   Diltiazem Swelling    Left leg swelling   Flagyl [Metronidazole] Nausea Only    Stomach ache   Hctz [Hydrochlorothiazide]    Levaquin [Levofloxacin In D5w] Other (See Comments)    unknown   Levofloxacin Nausea Only   Lidocaine     Pt unsure of   Penicillins Swelling    Has patient had a PCN reaction causing immediate rash, facial/tongue/throat swelling, SOB or lightheadedness with hypotension: No Has patient had a PCN reaction causing severe rash involving mucus membranes or skin necrosis: No Has patient had a PCN reaction that required hospitalization: No Has patient had a PCN reaction occurring within the last 10 years: No If all of the above answers are "NO", then may proceed with Cephalosporin use. Swelling at injection site   Teline [Tetracycline] Swelling  Hospitalization for 2 weeks, prostate swelling   Tetanus Toxoid Swelling    Swells arms, Please pre-medicate with benadryl.   Z-Pak [Azithromycin] Other (See Comments)    Pt can  not take with Amiodarone    Sulfonamide Derivatives Rash   Toprol Xl [Metoprolol Tartrate] Itching and Rash    Pt states he tolerates.    Social History   Socioeconomic History   Marital status: Widowed    Spouse name: [name]   Number of children: 2   Years of education: 16   Highest education level: Not on file  Occupational History    Employer: RETIRED  Tobacco Use   Smoking status: Former    Packs/day: 0.50    Years: 20.00    Total pack years: 10.00    Types: Cigarettes    Quit date: 06/03/1971    Years since quitting: 51.2    Passive exposure: Past   Smokeless tobacco: Never   Tobacco comments:    Former smoker 08/16/22  Vaping Use   Vaping Use: Never used  Substance and Sexual Activity   Alcohol use: No   Drug use: No   Sexual activity: Not on file  Other Topics Concern   Not on file  Social History Narrative   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   Patient has two children.   Patient is retired.   Patient has a college education.   Patient is right-handed.   Patient does not drink any caffeine.   Social Determinants of Health   Financial Resource Strain: Not on file  Food Insecurity: Not on file  Transportation Needs: Not on file  Physical Activity: Not on file  Stress: Not on file  Social Connections: Not on file  Intimate Partner Violence: Not on file    Family History  Problem Relation Age of Onset   Heart failure Father    Hypertension Father    Dementia Mother     ROS- All systems are reviewed and negative except as per the HPI above  [facility] Weights   08/16/22 1532  Weight: 114.8 kg    Physical Exam: Vitals:   08/16/22 1532  BP: (!) 144/76  Pulse: 61  Weight: 114.8 kg  Height: '5\' 9"'$  (1.753 m)    GEN- The patient is a well appearing elderly male, alert and oriented x 3 today.   HEENT-head normocephalic, atraumatic, sclera clear, conjunctiva pink, hearing intact, trachea midline. Lungs- Clear to ausculation  bilaterally, normal work of breathing Heart- Regular rate and rhythm, no murmurs, rubs or gallops  GI- soft, NT, ND, + BS Extremities- no clubbing, cyanosis, or edema MS- no significant deformity or atrophy Skin- no rash or lesion Psych- euthymic mood, full affect Neuro- strength and sensation are intact   EKG-  A paced rhythm, RBBB, PAC, NST Vent. rate 61 BPM PR interval 298 ms QRS duration 130 ms QT/QTcB 468/471 ms   Echo 07/27/20  1. Technically challenging study. Recommend use of echo contrast for  future studies.   2. Grossly normal LVEF, reduced sensitivity for focal wall motion  abnormalities given technical limitations.. Left ventricular ejection  fraction, by estimation, is 55 to 60%. The left ventricle has normal  function. The left ventricle has no regional wall motion abnormalities. Left ventricular diastolic parameters are indeterminate.   3. Right ventricular systolic function was not well visualized. The right  ventricular size is not well visualized. Tricuspid regurgitation signal is  inadequate for assessing PA pressure.  4. The mitral valve was not well visualized. Trivial mitral valve  regurgitation. No evidence of mitral stenosis. Moderate mitral annular  calcification.   5. The aortic valve was not well visualized. Aortic valve regurgitation  is not visualized.   Comparison(s): No significant change from prior study.    CHA2DS2-VASc Score = 4  The patient's score is based upon: CHF History: 1 (HFpEF) HTN History: 1 Diabetes History: 0 Stroke History: 0 Vascular Disease History: 0 Age Score: 2 Gender Score: 0       ASSESSMENT AND PLAN: 1. Persistent Atrial Fibrillation/atrial flutter The patient's CHA2DS2-VASc score is 4, indicating a 4.8% annual risk of stroke.   Patient converted to SR. Continue Toprol 100 mg BID Continue dofetilide 250 mcg BID. QT stable. Check magnesium today. Continue diltiazem 30 mg q 4 hours for heart racing.   Continue warfarin  2. Secondary Hypercoagulable State (ICD10:  D68.69) The patient is at significant risk for stroke/thromboembolism based upon his CHA2DS2-VASc Score of 4.  Continue Warfarin (Coumadin).   3. HTN Stable, resume irbesartan.   3. OSA Encouraged compliance with CPAP therapy.  4. Chronic HFpEF Fluid status appears stable.  5. Sinus node dysfunction S/p PPM, followed by Dr Caryl Comes and the device clinic.  6. ILD Followed by Dr Lamonte Sakai Patient previously failed amiodarone.    Follow up with Dr Percival Spanish and Dr Caryl Comes as scheduled. AF clinic in 6 months.    [facility] [address] [place], [place] [address] [phone]

## 2022-08-22 ENCOUNTER — Ambulatory Visit (INDEPENDENT_AMBULATORY_CARE_PROVIDER_SITE_OTHER): Payer: Medicare Other

## 2022-08-22 DIAGNOSIS — I480 Paroxysmal atrial fibrillation: Secondary | ICD-10-CM | POA: Diagnosis not present

## 2022-08-23 LAB — CUP PACEART REMOTE DEVICE CHECK
Battery Remaining Longevity: 58 mo
Battery Remaining Percentage: 52 %
Battery Voltage: 2.99 V
Brady Statistic AP VP Percent: 3.6 %
Brady Statistic AP VS Percent: 74 %
Brady Statistic AS VP Percent: 1 %
Brady Statistic AS VS Percent: 20 %
Brady Statistic RA Percent Paced: 73 %
Brady Statistic RV Percent Paced: 4.4 %
Date Time Interrogation Session: 20231009020034
Implantable Lead Implant Date: 20180808
Implantable Lead Implant Date: 20180808
Implantable Lead Location: 753859
Implantable Lead Location: 753860
Implantable Lead Model: 5076
Implantable Lead Model: 5076
Implantable Pulse Generator Implant Date: 20180808
Lead Channel Impedance Value: 460 Ohm
Lead Channel Impedance Value: 480 Ohm
Lead Channel Pacing Threshold Amplitude: 0.5 V
Lead Channel Pacing Threshold Amplitude: 0.75 V
Lead Channel Pacing Threshold Pulse Width: 0.5 ms
Lead Channel Pacing Threshold Pulse Width: 0.5 ms
Lead Channel Sensing Intrinsic Amplitude: 12 mV
Lead Channel Sensing Intrinsic Amplitude: 3.1 mV
Lead Channel Setting Pacing Amplitude: 2 V
Lead Channel Setting Pacing Amplitude: 2.5 V
Lead Channel Setting Pacing Pulse Width: 0.5 ms
Lead Channel Setting Sensing Sensitivity: 2 mV
Pulse Gen Model: 2272
Pulse Gen Serial Number: 8932564

## 2022-08-25 ENCOUNTER — Emergency Department (HOSPITAL_BASED_OUTPATIENT_CLINIC_OR_DEPARTMENT_OTHER): Payer: Medicare Other | Admitting: Radiology

## 2022-08-25 ENCOUNTER — Other Ambulatory Visit: Payer: Self-pay

## 2022-08-25 ENCOUNTER — Encounter (HOSPITAL_BASED_OUTPATIENT_CLINIC_OR_DEPARTMENT_OTHER): Payer: Self-pay

## 2022-08-25 ENCOUNTER — Emergency Department (HOSPITAL_BASED_OUTPATIENT_CLINIC_OR_DEPARTMENT_OTHER): Payer: Medicare Other

## 2022-08-25 DIAGNOSIS — Z7901 Long term (current) use of anticoagulants: Secondary | ICD-10-CM | POA: Insufficient documentation

## 2022-08-25 DIAGNOSIS — L03115 Cellulitis of right lower limb: Secondary | ICD-10-CM | POA: Insufficient documentation

## 2022-08-25 DIAGNOSIS — Z79899 Other long term (current) drug therapy: Secondary | ICD-10-CM | POA: Diagnosis not present

## 2022-08-25 DIAGNOSIS — R600 Localized edema: Secondary | ICD-10-CM | POA: Diagnosis not present

## 2022-08-25 DIAGNOSIS — Z8551 Personal history of malignant neoplasm of bladder: Secondary | ICD-10-CM | POA: Insufficient documentation

## 2022-08-25 DIAGNOSIS — S99911A Unspecified injury of right ankle, initial encounter: Secondary | ICD-10-CM | POA: Diagnosis not present

## 2022-08-25 DIAGNOSIS — I10 Essential (primary) hypertension: Secondary | ICD-10-CM | POA: Insufficient documentation

## 2022-08-25 DIAGNOSIS — M7989 Other specified soft tissue disorders: Secondary | ICD-10-CM | POA: Diagnosis not present

## 2022-08-25 DIAGNOSIS — Z0389 Encounter for observation for other suspected diseases and conditions ruled out: Secondary | ICD-10-CM | POA: Diagnosis not present

## 2022-08-25 DIAGNOSIS — Z87891 Personal history of nicotine dependence: Secondary | ICD-10-CM | POA: Insufficient documentation

## 2022-08-25 NOTE — ED Triage Notes (Signed)
Patient here POV from Home.  Endorses Right Lower Leg Swelling that began 1 Week ago. States he initially injured it against a wheelchair then. Swelling has worsened since and patient has concerns for Possible DVT as Swelling and Tightness is now present radiating upwards.  NAD Noted during Triage. A&Ox4. GCS 15. BIB Wheelchair.

## 2022-08-26 ENCOUNTER — Emergency Department (HOSPITAL_BASED_OUTPATIENT_CLINIC_OR_DEPARTMENT_OTHER)
Admission: EM | Admit: 2022-08-26 | Discharge: 2022-08-26 | Disposition: A | Discharge status: Home / Self Care | Payer: Medicare Other | Attending: Emergency Medicine | Admitting: Emergency Medicine

## 2022-08-26 DIAGNOSIS — L03115 Cellulitis of right lower limb: Secondary | ICD-10-CM

## 2022-08-26 DIAGNOSIS — R600 Localized edema: Secondary | ICD-10-CM

## 2022-08-26 MED ORDER — CEFUROXIME AXETIL 500 MG PO TABS: 500.0000 mg | ORAL_TABLET | Freq: Two times a day (BID) | ORAL | 0 refills | Status: DC

## 2022-08-26 MED ORDER — CEPHALEXIN 250 MG PO CAPS
500.0000 mg | ORAL_CAPSULE | Freq: Once | ORAL | Status: AC
  Administered 2022-08-26: 500 mg via ORAL
  Filled 2022-08-26: qty 2

## 2022-08-26 NOTE — ED Provider Notes (Signed)
[facility] Provider Note: Georgena Spurling, MD, FACEP  CSN: [id number] MRN: [id number] ARRIVAL: 08/25/22 at 2117 ROOM: [place]  Leg Swelling   HISTORY OF PRESENT ILLNESS  08/26/22 3:59 AM Chad [name] is a 88 y.o. male who struck his right lateral ankle on his wheelchair on 08/16/2022.  He did not have immediate pain but did develop some ecchymosis in that area.  About a week ago he began having right lower leg swelling.  Over the past 2 days he has had some shooting pains in that leg as well as some erythema developing anteriorly.  He was brought by family out of concern for DVT.   Past Medical History:  Diagnosis Date   Bladder cancer ([place]) 2010   Chronic bronchitis ([place])    Hypertension    Hypoglycemia    On home oxygen therapy    "2L w/CPAP at night" (06/21/2017)   OSA on CPAP    "w/2L O2 at night" (06/21/2017)   Persistent atrial fibrillation ([place])        Pneumonia    "when he was young; again in 2016" (06/21/2017)   Presence of permanent cardiac pacemaker    Sensory ataxia     Past Surgical History:  Procedure Laterality Date   BACK SURGERY     CARDIOVERSION N/A 04/23/2015   Procedure: CARDIOVERSION;  Surgeon: [name], MD;  Location: [place];  Service: Cardiovascular;  Laterality: N/A;   CARDIOVERSION N/A 07/30/2015   Procedure: CARDIOVERSION;  Surgeon: [name], MD;  Location: [facility] ENDOSCOPY;  Service: Cardiovascular;  Laterality: N/A;   EYE SURGERY Bilateral 2009   "to lower pressure; no glaucoma" (06/21/2017)   INSERT / REPLACE / REMOVE PACEMAKER  06/21/2017   LUMBAR [place]   PACEMAKER IMPLANT N/A 06/21/2017   Procedure: Pacemaker Implant;  Surgeon: Deboraha Sprang, MD;  Location: [place] [facility];  Service: Cardiovascular;  Laterality: N/A;   TRANSURETHRAL RESECTION OF BLADDER TUMOR WITH GYRUS (TURBT-GYRUS)  07/10/2009    Cystourethroscopy, gyrus TURBT (transurethral  resection of bladder tumor).[name] 03/16/2011     Family History  Problem Relation Age of Onset   Heart failure Father    Hypertension Father    Dementia Mother     Social History   Tobacco Use   Smoking status: Former    Packs/day: 0.50    Years: 20.00    Total pack years: 10.00    Types: Cigarettes    Quit date: 06/03/1971    Years since quitting: 51.2    Passive exposure: Past   Smokeless tobacco: Never   Tobacco comments:    Former smoker 08/16/22  Vaping Use   Vaping Use: Never used  Substance Use Topics   Alcohol use: No   Drug use: No    Prior to Admission medications   Medication Sig Start Date End Date Taking? Authorizing Provider  cefUROXime (CEFTIN) 500 MG tablet Take 1 tablet (500 mg total) by mouth 2 (two) times daily with a meal. 08/26/22  Yes Lequan Dobratz, MD  Cholecalciferol (VITAMIN D3) 2000 units capsule Take 2,000 Units by mouth 2 (two) times daily.    [provider]  diclofenac Sodium (VOLTAREN) 1 % GEL Apply 4 g topically 4 (four) times daily. 08/08/22   Deno Etienne, DO  diltiazem (CARDIZEM) 30 MG tablet Take 1 tablet every 8 hours as needed 04/29/22   Minus Breeding, MD  dofetilide (TIKOSYN) 250 MCG capsule Take 1 capsule by  mouth twice daily 06/14/22   Minus Breeding, MD  escitalopram (LEXAPRO) 10 MG tablet Take 10 mg by mouth at bedtime.    [provider]  furosemide (LASIX) 20 MG tablet Take 1 tablet by mouth twice daily 07/15/22   Minus Breeding, MD  irbesartan (AVAPRO) 300 MG tablet Take 1 tablet by mouth once daily 06/14/22   Minus Breeding, MD  metoprolol succinate (TOPROL-XL) 100 MG 24 hr tablet Take 1 tablet (100 mg total) by mouth 2 (two) times daily. 08/16/22   Fenton, Clint R, PA  Multiple Vitamins-Minerals (CENTRUM SILVER PO) Take 1 tablet by mouth daily.    [provider]  mupirocin ointment (BACTROBAN) 2 % Apply topically 2 (two) times daily. 05/24/21   [provider]  OXYGEN Inhale 2 L into the lungs at bedtime. Uses with CPAP machine    [provider]  triamcinolone ointment (KENALOG) 0.1 % Apply 1 application topically 2 (two) times daily. 06/08/21   [provider]  vitamin B-12 (CYANOCOBALAMIN) 500 MCG tablet Take 500 mcg by mouth 2 (two) times daily.    [provider]  warfarin (COUMADIN) 5 MG tablet TAKE 1/2 TO 1 (ONE-HALF TO ONE) TABLET BY MOUTH ONCE DAILY  AS  DIRECTED  BY  COUMADIN  CLINIC 05/10/22   Minus Breeding, MD    Allergies Doxycycline, Amiodarone, Carbapenems, Cephalexin, Cephalosporins, Chlorpheniramine, Ciprofloxacin, Decongestant [oxymetazoline], Diltiazem, Flagyl [metronidazole], Hctz [hydrochlorothiazide], Levaquin [levofloxacin in d5w], Levofloxacin, Lidocaine, Penicillins, Teline [tetracycline], Tetanus toxoid, Z-pak [azithromycin], Sulfonamide derivatives, and Toprol xl [metoprolol tartrate]   REVIEW OF SYSTEMS  Negative except as noted here or in the History of Present Illness.   PHYSICAL EXAMINATION  Initial Vital Signs Blood pressure (!) 164/70, pulse 60, temperature 98.7 F (37.1 C), temperature source Oral, resp. rate 14, height '5\' 9"'$  (1.753 m), weight 114.8 kg, SpO2 94 %.  Examination General: Well-developed, well-nourished male in no acute distress; appearance consistent with age of record HENT: normocephalic; atraumatic Eyes: Normal appearance Neck: supple Heart: regular rate and rhythm Lungs: clear to auscultation bilaterally Abdomen: soft; nondistended; nontender; bowel sounds present Extremities: No acute deformity; edema of right lower leg; erythema and warmth of anterior right lower leg with ecchymosis of the lateral right ankle:    Neurologic: Awake, alert and oriented; motor function intact in all extremities and symmetric; no facial droop Skin: Warm and dry Psychiatric: Normal mood and affect   RESULTS  Summary of this visit's results, reviewed and interpreted by myself:   EKG Interpretation  Date/Time:    Ventricular Rate:    PR Interval:    QRS  Duration:   QT Interval:    QTC Calculation:   R Axis:     Text Interpretation:         Laboratory Studies: No results found for this or any previous visit (from the past 24 hour(s)). Imaging Studies: US Venous Img Lower Unilateral Right  Result Date: 08/25/2022 CLINICAL DATA:  DVT R/O EXAM: Right LOWER EXTREMITY VENOUS DOPPLER ULTRASOUND TECHNIQUE: Gray-scale sonography with compression, as well as color and duplex ultrasound, were performed to evaluate the deep venous system(s) from the level of the common femoral vein through the popliteal and proximal calf veins. COMPARISON:  None Available. FINDINGS: VENOUS Normal compressibility of the common femoral, superficial femoral, and popliteal veins, as well as the visualized calf veins. Visualized portions of profunda femoral vein and great saphenous vein unremarkable. No filling defects to suggest DVT on grayscale or color Doppler imaging. Doppler waveforms show  normal direction of venous flow, normal respiratory plasticity and response to augmentation. Limited views of the contralateral common femoral vein are unremarkable. OTHER None. Limitations: none IMPRESSION: No deep venous thrombosis of the right lower extremity. Electronically Signed   By: Iven Finn M.D.   On: 08/25/2022 23:36   DG Ankle Complete Right  Result Date: 08/25/2022 CLINICAL DATA:  Right lower leg swelling following injury. EXAM: RIGHT ANKLE - COMPLETE 3+ VIEW COMPARISON:  None Available. FINDINGS: There is no evidence of fracture, dislocation, or joint effusion. Plantar and post calcaneal enthesophytes are noted. There is diffuse osseous demineralization. There is soft tissue swelling of the foot and ankle. IMPRESSION: No acute osseous injury. Electronically Signed   By: Zerita Boers M.D.   On: 08/25/2022 22:00    ED COURSE and MDM  Nursing notes, initial and subsequent vitals signs, including pulse oximetry, reviewed and interpreted by myself.  Vitals:    08/25/22 2127 08/26/22 0203 08/26/22 0315 08/26/22 0400  BP: 130/62 (!) 158/92 (!) 164/70 (!) 137/90  Pulse: (!) 57 (!) 57 60 63  Resp: (!) '24 20 14 13  '$ Temp: 97.9 F (36.6 C) 98.7 F (37.1 C)    TempSrc:  Oral    SpO2: 97% 96% 94% 93%  Weight:      Height:       Medications  cephALEXin (KEFLEX) capsule 500 mg (has no administration in time range)   There is no evidence of ankle fracture on radiograph and no evidence of deep vein thrombosis on Doppler ultrasound.  The erythema and warmth is concerning for developing cellulitis.  We will start on cefuroxime for this.    PROCEDURES  Procedures   ED DIAGNOSES     ICD-10-CM   1. Edema of right lower extremity  R60.0     2. Cellulitis of right lower extremity  L03.115          Shanon Rosser, MD 08/26/22 [phone]

## 2022-09-02 NOTE — Progress Notes (Signed)
Remote pacemaker transmission.   

## 2022-09-03 DIAGNOSIS — G4733 Obstructive sleep apnea (adult) (pediatric): Secondary | ICD-10-CM | POA: Diagnosis not present

## 2022-09-03 DIAGNOSIS — I4891 Unspecified atrial fibrillation: Secondary | ICD-10-CM | POA: Diagnosis not present

## 2022-09-27 ENCOUNTER — Ambulatory Visit: Payer: Medicare Other | Attending: Cardiology | Admitting: *Deleted

## 2022-09-27 DIAGNOSIS — Z5181 Encounter for therapeutic drug level monitoring: Secondary | ICD-10-CM

## 2022-09-27 DIAGNOSIS — I4819 Other persistent atrial fibrillation: Secondary | ICD-10-CM

## 2022-09-27 LAB — POCT INR: INR: 4.4 — AB (ref 2.0–3.0)

## 2022-09-27 NOTE — Patient Instructions (Signed)
Hold warfarin tonight and tomorrow night then resume 1/2 tablet daily except 1 tablet on Thursdays Recheck in 3 weeks

## 2022-10-04 DIAGNOSIS — G4733 Obstructive sleep apnea (adult) (pediatric): Secondary | ICD-10-CM | POA: Diagnosis not present

## 2022-10-04 DIAGNOSIS — I4891 Unspecified atrial fibrillation: Secondary | ICD-10-CM | POA: Diagnosis not present

## 2022-10-10 DIAGNOSIS — I8311 Varicose veins of right lower extremity with inflammation: Secondary | ICD-10-CM | POA: Diagnosis not present

## 2022-10-10 DIAGNOSIS — I8312 Varicose veins of left lower extremity with inflammation: Secondary | ICD-10-CM | POA: Diagnosis not present

## 2022-10-10 DIAGNOSIS — L570 Actinic keratosis: Secondary | ICD-10-CM | POA: Diagnosis not present

## 2022-10-10 DIAGNOSIS — D225 Melanocytic nevi of trunk: Secondary | ICD-10-CM | POA: Diagnosis not present

## 2022-10-10 DIAGNOSIS — I872 Venous insufficiency (chronic) (peripheral): Secondary | ICD-10-CM | POA: Diagnosis not present

## 2022-10-10 DIAGNOSIS — L814 Other melanin hyperpigmentation: Secondary | ICD-10-CM | POA: Diagnosis not present

## 2022-10-18 ENCOUNTER — Ambulatory Visit: Payer: Medicare Other | Attending: Cardiology | Admitting: *Deleted

## 2022-10-18 DIAGNOSIS — I4819 Other persistent atrial fibrillation: Secondary | ICD-10-CM

## 2022-10-18 DIAGNOSIS — Z5181 Encounter for therapeutic drug level monitoring: Secondary | ICD-10-CM | POA: Diagnosis not present

## 2022-10-18 LAB — POCT INR: INR: 2.2 (ref 2.0–3.0)

## 2022-10-18 NOTE — Patient Instructions (Signed)
Continue warfarin 1/2 tablet daily except 1 tablet on Thursdays Recheck in 4 weeks

## 2022-10-21 DIAGNOSIS — H52203 Unspecified astigmatism, bilateral: Secondary | ICD-10-CM | POA: Diagnosis not present

## 2022-10-24 ENCOUNTER — Other Ambulatory Visit: Payer: Self-pay | Admitting: Cardiology

## 2022-10-24 DIAGNOSIS — I4819 Other persistent atrial fibrillation: Secondary | ICD-10-CM

## 2022-11-01 NOTE — Progress Notes (Deleted)
Cardiology Office Note   Date:  11/01/2022   ID:  [name], DOB 03-15-1929, MRN [id number]  PCP:  Redmond School, MD  Cardiologist:   Minus Breeding, MD   No chief complaint on file.      History of Present Illness: Chad [name] is a 88 y.o. male who presents for follow up of atrial fib.   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  He has been treated for SVT as well by Dr. Caryl Comes.    He is followed in the Atrial Fib clinic.  He is being treated with Tikosyn.     ***  *** He has had shortness of breath that seems to be progressive.  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  We have wanted him to get back to see pulmonary but that has not yet happened.  He gets dyspneic with mild exertion.  Today he was short of breath with oxygen sats as below.  He has been having some frequent tachypalpitations at night that he thinks is his fibrillation.  It might last up to 6 hours.  It might go into the 140s.  He had a little arm discomfort at times when this happens.  He has some mild increased lower extremity swelling.  He gets around slowly at home limited in part because of his breathing but very much because of his back.  He is not having any chest pressure, neck or arm discomfort.  He denies any presyncope or syncope.  He has not had any PND or orthopnea.  He wears CPAP with oxygen at night.   Past Medical History:  Diagnosis Date   Bladder cancer ([place]) 2010   Chronic bronchitis ([place])    Hypertension    Hypoglycemia    On home oxygen therapy    "2L w/CPAP at night" (06/21/2017)   OSA on CPAP    "w/2L O2 at night" (06/21/2017)   Persistent atrial fibrillation ([place])        Pneumonia    "when he was young; again in 2016" (06/21/2017)   Presence of permanent cardiac pacemaker    Sensory ataxia     Past Surgical History:  Procedure Laterality Date   BACK  SURGERY     CARDIOVERSION N/A 04/23/2015   Procedure: CARDIOVERSION;  Surgeon: [name], MD;  Location: [facility];  Service: Cardiovascular;  Laterality: N/A;   CARDIOVERSION N/A 07/30/2015   Procedure: CARDIOVERSION;  Surgeon: [name], MD;  Location: [facility] ENDOSCOPY;  Service: Cardiovascular;  Laterality: N/A;   EYE SURGERY Bilateral 2009   "to lower pressure; no glaucoma" (06/21/2017)   INSERT / REPLACE / REMOVE PACEMAKER  06/21/2017   LUMBAR [place]   PACEMAKER IMPLANT N/A 06/21/2017   Procedure: Pacemaker Implant;  Surgeon: Deboraha Sprang, MD;  Location: [place] [facility];  Service: Cardiovascular;  Laterality: N/A;   TRANSURETHRAL RESECTION OF BLADDER TUMOR WITH GYRUS (TURBT-GYRUS)  07/10/2009    Cystourethroscopy, gyrus TURBT (transurethral  resection of bladder tumor).[name] 03/16/2011     Current Outpatient Medications  Medication Sig Dispense Refill   cefUROXime (CEFTIN) 500 MG tablet Take 1 tablet (500 mg total) by mouth 2 (two) times daily with a meal. 14 tablet 0   Cholecalciferol (VITAMIN D3) 2000 units capsule Take 2,000 Units by mouth 2 (two) times daily.     diclofenac Sodium (VOLTAREN) 1 % GEL  Apply 4 g topically 4 (four) times daily. 100 g 0   diltiazem (CARDIZEM) 30 MG tablet Take 1 tablet every 8 hours as needed 90 tablet 3   dofetilide (TIKOSYN) 250 MCG capsule Take 1 capsule by mouth twice daily 180 capsule 3   escitalopram (LEXAPRO) 10 MG tablet Take 10 mg by mouth at bedtime.     furosemide (LASIX) 20 MG tablet Take 1 tablet by mouth twice daily 180 tablet 2   irbesartan (AVAPRO) 300 MG tablet Take 1 tablet by mouth once daily 90 tablet 3   metoprolol succinate (TOPROL-XL) 100 MG 24 hr tablet Take 1 tablet (100 mg total) by mouth 2 (two) times daily. 60 tablet 3   Multiple Vitamins-Minerals (CENTRUM SILVER PO) Take 1 tablet by mouth daily.     mupirocin ointment (BACTROBAN) 2 % Apply topically 2 (two) times daily.     OXYGEN Inhale 2 L into the lungs  at bedtime. Uses with CPAP machine     triamcinolone ointment (KENALOG) 0.1 % Apply 1 application topically 2 (two) times daily.     vitamin B-12 (CYANOCOBALAMIN) 500 MCG tablet Take 500 mcg by mouth 2 (two) times daily.     warfarin (COUMADIN) 5 MG tablet TAKE 1/2 TO 1 (ONE-HALF TO ONE) TABLET BY MOUTH ONCE DAILY AS DIRECTED BY  COUMADIN  CLINIC 90 tablet 0   No current facility-administered medications for this visit.    Allergies:   Doxycycline, Amiodarone, Carbapenems, Cephalexin, Cephalosporins, Chlorpheniramine, Ciprofloxacin, Decongestant [oxymetazoline], Diltiazem, Flagyl [metronidazole], Hctz [hydrochlorothiazide], Levaquin [levofloxacin in d5w], Levofloxacin, Lidocaine, Penicillins, Teline [tetracycline], Tetanus toxoid, Z-pak [azithromycin], Sulfonamide derivatives, and Toprol xl [metoprolol tartrate]    ROS:  Please see the history of present illness.   Otherwise, review of systems are positive for ***.   All other systems are reviewed and negative.    PHYSICAL EXAM: VS:  There were no vitals taken for this visit. , BMI There is no height or weight on file to calculate BMI. GENERAL:  Well appearing NECK:  No jugular venous distention, waveform within normal limits, carotid upstroke brisk and symmetric, no bruits, no thyromegaly LUNGS:  Clear to auscultation bilaterally CHEST:  Unremarkable HEART:  PMI not displaced or sustained,S1 and S2 within normal limits, no S3, no S4, no clicks, no rubs, *** murmurs ABD:  Flat, positive bowel sounds normal in frequency in pitch, no bruits, no rebound, no guarding, no midline pulsatile mass, no hepatomegaly, no splenomegaly EXT:  2 plus pulses throughout, no edema, no cyanosis no clubbing     ***GENERAL:  Well appearing NECK:  No jugular venous distention, waveform within normal limits, carotid upstroke brisk and symmetric, no bruits, no thyromegaly LUNGS: Bilateral fine basilar crackles CHEST:  Unremarkable HEART:  PMI not displaced or  sustained,S1 and S2 within normal limits, no S3, no S4, no clicks, no rubs, no murmurs ABD:  Flat, positive bowel sounds normal in frequency in pitch, no bruits, no rebound, no guarding, no midline pulsatile mass, no hepatomegaly, no splenomegaly EXT:  2 plus pulses throughout, mild bilateral leg edema, no cyanosis no clubbing   EKG: 11/01/2022 atrial paced rhythm, rate ***, right bundle branch block, first-degree AV block, chronic lateral T wave inversions, premature atrial contractions  Recent Labs: 08/08/2022: BUN 22; Creatinine, Ser 1.28; Hemoglobin 14.7; Platelets 176; Potassium 4.2; Sodium 139 08/16/2022: Magnesium 2.3    Lipid Panel    Component Value Date/Time   CHOL 179 06/10/2010 1617   TRIG 75 06/10/2010 1617   HDL  46 06/10/2010 1617   LDLCALC 118 06/10/2010 1617      Wt Readings from Last 3 Encounters:  08/25/22 253 lb 1.4 oz (114.8 kg)  08/16/22 253 lb (114.8 kg)  08/11/22 248 lb 6.4 oz (112.7 kg)    SATURATION QUALIFICATIONS: (This note is used to comply with regulatory documentation for home oxygen)  Patient Saturations on Room Air at Rest = 6 %  Patient Saturations on Room Air while Ambulating = 86%  Patient Saturations on 2 Liters of oxygen while Ambulating = 93%  Please briefly explain why patient needs home oxygen:  Other studies Reviewed: Additional studies/ records that were reviewed today include:  *** Review of the above records demonstrates: See elsewhere   ASSESSMENT AND PLAN:   SOB:    I***   sent a message to see if I can get him into see pulmonary.  I am also going to set him up for home O2.  ATRIAL FIB:  Chad [name] has a CHA2DS2 - VASc score of he remains on anticoagulation.  ***  He will remain on the Tikosyn.  However, I am also going to give him Cardizem 30 mg p.o. as needed tachypalpitations and he will let me know if he has had to use this frequently.  HTN:  The blood pressure is ***  at target.  No change in therapy  HFpEF:    ***    I told him to take an extra 20 mg of Lasix for the next 3 days.  He is also going to start to keep his feet elevated.  He knows to watch his salt and fluid intake.  VT:  ***     Has been no documented symptomatic recurrence of this.  No change in therapy.  SLEEP APNEA:   ***    He uses his CPAP routinely.  No change in therapy.   Current medicines are reviewed at length with the patient today.  The patient does not have concerns regarding medicines.  The following changes have been made:   ***  Labs/ tests ordered today include:      No orders of the defined types were placed in this encounter.   Disposition:   FU with me in *** months.     Signed, Minus Breeding, MD  11/01/2022 10:17 PM    [facility]

## 2022-11-03 ENCOUNTER — Ambulatory Visit: Payer: Medicare Other | Attending: Cardiology | Admitting: *Deleted

## 2022-11-03 ENCOUNTER — Ambulatory Visit: Payer: Medicare Other | Admitting: Cardiology

## 2022-11-03 DIAGNOSIS — I4891 Unspecified atrial fibrillation: Secondary | ICD-10-CM | POA: Diagnosis not present

## 2022-11-03 DIAGNOSIS — Z5181 Encounter for therapeutic drug level monitoring: Secondary | ICD-10-CM

## 2022-11-03 DIAGNOSIS — I10 Essential (primary) hypertension: Secondary | ICD-10-CM

## 2022-11-03 DIAGNOSIS — R0602 Shortness of breath: Secondary | ICD-10-CM

## 2022-11-03 DIAGNOSIS — I480 Paroxysmal atrial fibrillation: Secondary | ICD-10-CM

## 2022-11-03 DIAGNOSIS — G4733 Obstructive sleep apnea (adult) (pediatric): Secondary | ICD-10-CM | POA: Diagnosis not present

## 2022-11-03 DIAGNOSIS — I5032 Chronic diastolic (congestive) heart failure: Secondary | ICD-10-CM

## 2022-11-03 DIAGNOSIS — I4819 Other persistent atrial fibrillation: Secondary | ICD-10-CM

## 2022-11-03 LAB — POCT INR: POC INR: 2.3

## 2022-11-03 NOTE — Patient Instructions (Signed)
Description   Continue warfarin 1/2 tablet daily except 1 tablet on Thursdays Recheck in 5 weeks

## 2022-11-10 ENCOUNTER — Ambulatory Visit: Payer: Medicare Other | Admitting: Podiatry

## 2022-11-10 DIAGNOSIS — M79675 Pain in left toe(s): Secondary | ICD-10-CM

## 2022-11-10 DIAGNOSIS — Z7901 Long term (current) use of anticoagulants: Secondary | ICD-10-CM | POA: Diagnosis not present

## 2022-11-10 DIAGNOSIS — M79674 Pain in right toe(s): Secondary | ICD-10-CM

## 2022-11-10 DIAGNOSIS — B351 Tinea unguium: Secondary | ICD-10-CM

## 2022-11-15 ENCOUNTER — Other Ambulatory Visit (HOSPITAL_COMMUNITY): Payer: Self-pay | Admitting: *Deleted

## 2022-11-15 MED ORDER — METOPROLOL SUCCINATE ER 100 MG PO TB24: 100.0000 mg | ORAL_TABLET | Freq: Two times a day (BID) | ORAL | 3 refills | Status: DC

## 2022-11-15 NOTE — Progress Notes (Signed)
Subjective: Chief Complaint  Patient presents with   foot care     Routine foot care     89 y.o. returns the office today for painful, elongated, thickened toenails which he cannot trim himself.  No swelling redness or injury to the toenail sites.  No other concerns.   He is on Coumadin  PCP: [name], MD  Objective: AAO 3, NAD DP/PT pulses palpable, CRT less than 3 seconds Nails hypertrophic, dystrophic, elongated, brittle, discolored 10.  Nails still are discolored yellow-brown discoloration with subungual debris.  Incurvation present the hallux toenail left side worse than right.  No edema, erythema or signs of infection of the toenail sites. No open lesions or pre-ulcerative lesions are identified. No pain with calf compression, swelling, warmth, erythema.  Assessment: Patient presents with symptomatic onychomycosis; chronic anticoagulation  Plan: -Treatment options including alternatives, risks, complications were discussed -Nails sharply debrided 10 without complication/bleeding.   -Discussed daily foot inspection. If there are any changes, to call the office immediately.  -Follow-up in 3 months or sooner if any problems are to arise. In the meantime, encouraged to call the office with any questions, concerns, changes symptoms.  Celesta Gentile, DPM

## 2022-11-21 ENCOUNTER — Ambulatory Visit (INDEPENDENT_AMBULATORY_CARE_PROVIDER_SITE_OTHER): Payer: BLUE CROSS/BLUE SHIELD

## 2022-11-21 DIAGNOSIS — I495 Sick sinus syndrome: Secondary | ICD-10-CM

## 2022-11-22 LAB — CUP PACEART REMOTE DEVICE CHECK
Battery Remaining Longevity: 55 mo
Battery Remaining Percentage: 49 %
Battery Voltage: 2.99 V
Brady Statistic AP VP Percent: 3 %
Brady Statistic AP VS Percent: 76 %
Brady Statistic AS VP Percent: 1 %
Brady Statistic AS VS Percent: 20 %
Brady Statistic RA Percent Paced: 74 %
Brady Statistic RV Percent Paced: 3.7 %
Date Time Interrogation Session: 20240108041631
Implantable Lead Connection Status: 753985
Implantable Lead Connection Status: 753985
Implantable Lead Implant Date: 20180808
Implantable Lead Implant Date: 20180808
Implantable Lead Location: 753859
Implantable Lead Location: 753860
Implantable Lead Model: 5076
Implantable Lead Model: 5076
Implantable Pulse Generator Implant Date: 20180808
Lead Channel Impedance Value: 430 Ohm
Lead Channel Impedance Value: 450 Ohm
Lead Channel Pacing Threshold Amplitude: 0.5 V
Lead Channel Pacing Threshold Amplitude: 0.75 V
Lead Channel Pacing Threshold Pulse Width: 0.5 ms
Lead Channel Pacing Threshold Pulse Width: 0.5 ms
Lead Channel Sensing Intrinsic Amplitude: 12 mV
Lead Channel Sensing Intrinsic Amplitude: 2.8 mV
Lead Channel Setting Pacing Amplitude: 2 V
Lead Channel Setting Pacing Amplitude: 2.5 V
Lead Channel Setting Pacing Pulse Width: 0.5 ms
Lead Channel Setting Sensing Sensitivity: 2 mV
Pulse Gen Model: 2272
Pulse Gen Serial Number: 8932564

## 2022-12-03 ENCOUNTER — Encounter (HOSPITAL_COMMUNITY): Payer: Self-pay | Admitting: Emergency Medicine

## 2022-12-03 ENCOUNTER — Emergency Department (HOSPITAL_COMMUNITY): Payer: Medicare Other

## 2022-12-03 ENCOUNTER — Other Ambulatory Visit: Payer: Self-pay

## 2022-12-03 ENCOUNTER — Emergency Department (HOSPITAL_COMMUNITY)
Admission: EM | Admit: 2022-12-03 | Discharge: 2022-12-04 | Disposition: A | Discharge status: Home / Self Care | Payer: Medicare Other | Attending: Emergency Medicine | Admitting: Emergency Medicine

## 2022-12-03 DIAGNOSIS — Z79899 Other long term (current) drug therapy: Secondary | ICD-10-CM | POA: Insufficient documentation

## 2022-12-03 DIAGNOSIS — W01198A Fall on same level from slipping, tripping and stumbling with subsequent striking against other object, initial encounter: Secondary | ICD-10-CM | POA: Insufficient documentation

## 2022-12-03 DIAGNOSIS — R58 Hemorrhage, not elsewhere classified: Secondary | ICD-10-CM | POA: Diagnosis not present

## 2022-12-03 DIAGNOSIS — S065X0A Traumatic subdural hemorrhage without loss of consciousness, initial encounter: Secondary | ICD-10-CM | POA: Insufficient documentation

## 2022-12-03 DIAGNOSIS — S20211A Contusion of right front wall of thorax, initial encounter: Secondary | ICD-10-CM | POA: Diagnosis not present

## 2022-12-03 DIAGNOSIS — Y9301 Activity, walking, marching and hiking: Secondary | ICD-10-CM | POA: Diagnosis not present

## 2022-12-03 DIAGNOSIS — Z950 Presence of cardiac pacemaker: Secondary | ICD-10-CM | POA: Diagnosis not present

## 2022-12-03 DIAGNOSIS — S0003XA Contusion of scalp, initial encounter: Secondary | ICD-10-CM | POA: Diagnosis not present

## 2022-12-03 DIAGNOSIS — Y92008 Other place in unspecified non-institutional (private) residence as the place of occurrence of the external cause: Secondary | ICD-10-CM | POA: Insufficient documentation

## 2022-12-03 DIAGNOSIS — S0990XA Unspecified injury of head, initial encounter: Secondary | ICD-10-CM | POA: Diagnosis not present

## 2022-12-03 DIAGNOSIS — J9811 Atelectasis: Secondary | ICD-10-CM | POA: Diagnosis not present

## 2022-12-03 DIAGNOSIS — J439 Emphysema, unspecified: Secondary | ICD-10-CM | POA: Diagnosis not present

## 2022-12-03 DIAGNOSIS — S065XAA Traumatic subdural hemorrhage with loss of consciousness status unknown, initial encounter: Secondary | ICD-10-CM

## 2022-12-03 DIAGNOSIS — R0902 Hypoxemia: Secondary | ICD-10-CM | POA: Diagnosis not present

## 2022-12-03 DIAGNOSIS — I10 Essential (primary) hypertension: Secondary | ICD-10-CM | POA: Diagnosis not present

## 2022-12-03 NOTE — ED Provider Notes (Signed)
[facility] Provider Note   CSN: [id number] Arrival date & time: 12/03/22  2305     History  Chief Complaint  Patient presents with   Chad [name]    Chad [name] is a 89 y.o. male.  Patient presents to the emergency department for evaluation after a fall.  Patient reports that he was walking up the ramp from his garage into his house and his feet got tangled up.  He fell onto his right side.  Patient reports hitting the right side of his head and his right ribs when he fell.  No loss of consciousness.       Home Medications Prior to Admission medications   Medication Sig Start Date End Date Taking? Authorizing Provider  cefUROXime (CEFTIN) 500 MG tablet Take 1 tablet (500 mg total) by mouth 2 (two) times daily with a meal. 08/26/22   Molpus, John, MD  Cholecalciferol (VITAMIN D3) 2000 units capsule Take 2,000 Units by mouth 2 (two) times daily.    [provider]  diclofenac Sodium (VOLTAREN) 1 % GEL Apply 4 g topically 4 (four) times daily. 08/08/22   Deno Etienne, DO  diltiazem (CARDIZEM) 30 MG tablet Take 1 tablet every 8 hours as needed 04/29/22   Minus Breeding, MD  dofetilide [facility]) 250 MCG capsule Take 1 capsule by mouth twice daily 06/14/22   Minus Breeding, MD  escitalopram (LEXAPRO) 10 MG tablet Take 10 mg by mouth at bedtime.    [provider]  furosemide (LASIX) 20 MG tablet Take 1 tablet by mouth twice daily 07/15/22   Minus Breeding, MD  irbesartan (AVAPRO) 300 MG tablet Take 1 tablet by mouth once daily 06/14/22   Minus Breeding, MD  metoprolol succinate (TOPROL-XL) 100 MG 24 hr tablet Take 1 tablet (100 mg total) by mouth 2 (two) times daily. 11/15/22   Fenton, Clint R, PA  Multiple Vitamins-Minerals (CENTRUM SILVER PO) Take 1 tablet by mouth daily.    [provider]  mupirocin ointment (BACTROBAN) 2 % Apply topically 2 (two) times daily. 05/24/21   [provider]  OXYGEN Inhale 2 L into the  lungs at bedtime. Uses with CPAP machine    [provider]  triamcinolone ointment (KENALOG) 0.1 % Apply 1 application topically 2 (two) times daily. 06/08/21   [provider]  vitamin B-12 (CYANOCOBALAMIN) 500 MCG tablet Take 500 mcg by mouth 2 (two) times daily.    [provider]  warfarin (COUMADIN) 5 MG tablet TAKE 1/2 TO 1 (ONE-HALF TO ONE) TABLET BY MOUTH ONCE DAILY AS DIRECTED BY  COUMADIN  CLINIC 10/25/22   Minus Breeding, MD      Allergies    Doxycycline, Amiodarone, Carbapenems, Cephalexin, Cephalosporins, Chlorpheniramine, Ciprofloxacin, Decongestant [oxymetazoline], Diltiazem, Flagyl [metronidazole], Hctz [hydrochlorothiazide], Levaquin [levofloxacin in d5w], Levofloxacin, Lidocaine, Penicillins, Teline [tetracycline], Tetanus toxoid, Z-pak [azithromycin], Sulfonamide derivatives, and Toprol xl [metoprolol tartrate]    Review of Systems   Review of Systems  Physical Exam Updated Vital Signs BP (!) 152/86   Pulse 60   Temp 97.8 F (36.6 C) (Oral)   Resp 14   Ht '5\' 9"'$  (1.753 m)   Wt 114.8 kg   SpO2 100%   BMI 37.37 kg/m  Physical Exam Vitals and nursing note reviewed.  Constitutional:      General: He is not in acute distress.    Appearance: He is well-developed.  HENT:     Head: Normocephalic and atraumatic.  Mouth/Throat:     Mouth: Mucous membranes are moist.  Eyes:     General: Vision grossly intact. Gaze aligned appropriately.     Extraocular Movements: Extraocular movements intact.     Conjunctiva/sclera: Conjunctivae normal.  Cardiovascular:     Rate and Rhythm: Normal rate and regular rhythm.     Pulses: Normal pulses.     Heart sounds: Normal heart sounds, S1 normal and S2 normal. No murmur heard.    No friction rub. No gallop.  Pulmonary:     Effort: Pulmonary effort is normal. No respiratory distress.     Breath sounds: Normal breath sounds.  Abdominal:     Palpations: Abdomen is soft.     Tenderness: There is no  abdominal tenderness. There is no guarding or rebound.     Hernia: No hernia is present.  Musculoskeletal:        General: No swelling.     Cervical back: Full passive range of motion without pain, normal range of motion and neck supple. No pain with movement, spinous process tenderness or muscular tenderness. Normal range of motion.     Right hip: Normal.     Left hip: Normal.     Right knee: No swelling, deformity or effusion. Normal range of motion. No tenderness.     Right lower leg: No edema.     Left lower leg: No edema.       Legs:  Skin:    General: Skin is warm and dry.     Capillary Refill: Capillary refill takes less than 2 seconds.     Findings: No ecchymosis, erythema, lesion or wound.  Neurological:     Mental Status: He is alert and oriented to person, place, and time.     GCS: GCS eye subscore is 4. GCS verbal subscore is 5. GCS motor subscore is 6.     Cranial Nerves: Cranial nerves 2-12 are intact.     Sensory: Sensation is intact.     Motor: Motor function is intact. No weakness or abnormal muscle tone.     Coordination: Coordination is intact.  Psychiatric:        Mood and Affect: Mood normal.        Speech: Speech normal.        Behavior: Behavior normal.     ED Results / Procedures / Treatments   Labs (all labs ordered are listed, but only abnormal results are displayed) Labs Reviewed  BASIC METABOLIC PANEL - Abnormal; Notable for the following components:      Result Value   Creatinine, Ser 1.27 (*)    GFR, Estimated 53 (*)    All other components within normal limits  PROTIME-INR - Abnormal; Notable for the following components:   Prothrombin Time 23.3 (*)    INR 2.1 (*)    All other components within normal limits  CBC WITH DIFFERENTIAL/PLATELET    EKG EKG Interpretation  Date/Time:  Sunday December 04 2022 00:01:01 EST Ventricular Rate:  66 PR Interval:    QRS Duration: 145 QT Interval:  473 QTC Calculation: 496 R Axis:   75 Text  Interpretation: Atrial fibrillation Right bundle branch block Confirmed by Orpah Greek [phone]) on 12/04/2022 12:04:43 AM  Radiology DG Ribs Unilateral W/Chest Right  Result Date: 12/04/2022 CLINICAL DATA:  Fall and right chest wall pain. EXAM: RIGHT RIBS AND CHEST - 3+ VIEW COMPARISON:  Chest radiograph dated 08/07/2020. FINDINGS: Background of emphysema and chronic intra coarsening. There are bibasilar atelectasis/scarring. No  consolidative changes. There is no pleural effusion or pneumothorax. Stable cardiac silhouette. Left pectoral pacemaker device. No acute osseous pathology. No displaced rib fractures. IMPRESSION: 1. No displaced rib fractures. 2. Emphysema. Electronically Signed   By: Anner Crete M.D.   On: 12/04/2022 00:48   CT Head Wo Contrast  Result Date: 12/04/2022 CLINICAL DATA:  Fall EXAM: CT HEAD WITHOUT CONTRAST TECHNIQUE: Contiguous axial images were obtained from the base of the skull through the vertex without intravenous contrast. RADIATION DOSE REDUCTION: This exam was performed according to the departmental dose-optimization program which includes automated exposure control, adjustment of the mA and/or kV according to patient size and/or use of iterative reconstruction technique. COMPARISON:  None Available. FINDINGS: Brain: Small anterior right convexity subdural hematoma measuring 3 mm in thickness. No midline shift or other mass effect. There is periventricular hypoattenuation compatible with chronic microvascular disease. Mild generalized volume loss. Vascular: No hyperdense vessel or unexpected calcification. Skull: Normal. Negative for fracture or focal lesion. Right frontal scalp hematoma Sinuses/Orbits: No acute finding. Other: None. IMPRESSION: 1. Small anterior right convexity subdural hematoma measuring 3 mm in thickness. No midline shift or other mass effect. 2. Right frontal scalp hematoma. Critical Value/emergent results were called by telephone at the time  of interpretation on 12/04/2022 at 12:03 am to provider [facility] , who verbally acknowledged these results. Electronically Signed   By: Ulyses Jarred M.D.   On: 12/04/2022 00:03    Procedures Procedures    Medications Ordered in ED Medications  phytonadione (VITAMIN K) 5 mg in dextrose 5 % 50 mL IVPB (has no administration in time range)    ED Course/ Medical Decision Making/ A&P                             Medical Decision Making Amount and/or Complexity of Data Reviewed External Data Reviewed: labs, ECG and notes. Labs: ordered. Decision-making details documented in ED Course. Radiology: ordered and independent interpretation performed. Decision-making details documented in ED Course. ECG/medicine tests: ordered and independent interpretation performed. Decision-making details documented in ED Course.   Patient presents to the emergency department for evaluation after a fall.  Patient reports that he got his feet tangled up and fell forward.  He hit the right side of his head and right ribs.  Patient is on Coumadin because of persistent atrial fibrillation.  Underwent CT head because of the anticoagulation.  There is a small subdural without mass effect.  Patient is awake and alert without any significant complaints.  Neurologic exam is normal.  CT findings discussed with Dr. Kathyrn Sheriff.  Recommendation is to administer vitamin K.  He does not require Kcentra or Feiba.  Keep patient in ED and repeat CT head in 8 to 12 hours.  If there is no worsening bleed, patient can be discharged, hold Coumadin for 2 weeks.  X-ray of chest with ribs does not show any evidence of pneumothorax, pulmonary contusion, hemothorax or rib fractures.  Abdominal exam is benign, nontender.  No concern for intra-abdominal injury.  Patient will be signed out to oncoming ER physician to follow-up on repeat CT head.        Final Clinical Impression(s) / ED Diagnoses Final diagnoses:  SDH  (subdural hematoma) (HCC)  Rib contusion, right, initial encounter    Rx / DC Orders ED Discharge Orders     None         Annalese Stiner, Gwenyth Allegra, MD 12/04/22 0109

## 2022-12-03 NOTE — ED Triage Notes (Signed)
Pt BIB [facility] from home after he had mechanical fall tonight in his garage. Pt fell hitting his forehead on car, bleeding controlled at this time. Pt currently taking warfarin.

## 2022-12-04 ENCOUNTER — Emergency Department (HOSPITAL_COMMUNITY): Payer: Medicare Other

## 2022-12-04 ENCOUNTER — Encounter (HOSPITAL_COMMUNITY): Payer: Self-pay

## 2022-12-04 DIAGNOSIS — G4733 Obstructive sleep apnea (adult) (pediatric): Secondary | ICD-10-CM | POA: Diagnosis not present

## 2022-12-04 DIAGNOSIS — S065X0A Traumatic subdural hemorrhage without loss of consciousness, initial encounter: Secondary | ICD-10-CM | POA: Diagnosis not present

## 2022-12-04 DIAGNOSIS — J439 Emphysema, unspecified: Secondary | ICD-10-CM | POA: Diagnosis not present

## 2022-12-04 DIAGNOSIS — J9811 Atelectasis: Secondary | ICD-10-CM | POA: Diagnosis not present

## 2022-12-04 DIAGNOSIS — I4891 Unspecified atrial fibrillation: Secondary | ICD-10-CM | POA: Diagnosis not present

## 2022-12-04 DIAGNOSIS — Z950 Presence of cardiac pacemaker: Secondary | ICD-10-CM | POA: Diagnosis not present

## 2022-12-04 LAB — CBC WITH DIFFERENTIAL/PLATELET
Abs Immature Granulocytes: 0.03 10*3/uL (ref 0.00–0.07)
Basophils Absolute: 0 10*3/uL (ref 0.0–0.1)
Basophils Relative: 0 %
Eosinophils Absolute: 0.2 10*3/uL (ref 0.0–0.5)
Eosinophils Relative: 3 %
HCT: 47.4 % (ref 39.0–52.0)
Hemoglobin: 15.1 g/dL (ref 13.0–17.0)
Immature Granulocytes: 0 %
Lymphocytes Relative: 16 %
Lymphs Abs: 1.1 10*3/uL (ref 0.7–4.0)
MCH: 29.6 pg (ref 26.0–34.0)
MCHC: 31.9 g/dL (ref 30.0–36.0)
MCV: 92.9 fL (ref 80.0–100.0)
Monocytes Absolute: 0.6 10*3/uL (ref 0.1–1.0)
Monocytes Relative: 9 %
Neutro Abs: 4.8 10*3/uL (ref 1.7–7.7)
Neutrophils Relative %: 72 %
Platelets: 164 10*3/uL (ref 150–400)
RBC: 5.1 MIL/uL (ref 4.22–5.81)
RDW: 14.4 % (ref 11.5–15.5)
WBC: 6.8 10*3/uL (ref 4.0–10.5)
nRBC: 0 % (ref 0.0–0.2)

## 2022-12-04 LAB — BASIC METABOLIC PANEL
Anion gap: 10 (ref 5–15)
BUN: 18 mg/dL (ref 8–23)
CO2: 28 mmol/L (ref 22–32)
Calcium: 9 mg/dL (ref 8.9–10.3)
Chloride: 99 mmol/L (ref 98–111)
Creatinine, Ser: 1.27 mg/dL — ABNORMAL HIGH (ref 0.61–1.24)
GFR, Estimated: 53 mL/min — ABNORMAL LOW (ref >60)
Glucose, Bld: 97 mg/dL (ref 70–99)
Potassium: 4 mmol/L (ref 3.5–5.1)
Sodium: 137 mmol/L (ref 135–145)

## 2022-12-04 LAB — PROTIME-INR
INR: 1.6 — ABNORMAL HIGH (ref 0.8–1.2)
INR: 2.1 — ABNORMAL HIGH (ref 0.8–1.2)
Prothrombin Time: 19.3 seconds — ABNORMAL HIGH (ref 11.4–15.2)
Prothrombin Time: 23.3 seconds — ABNORMAL HIGH (ref 11.4–15.2)

## 2022-12-04 MED ORDER — VITAMIN K1 10 MG/ML IJ SOLN
INTRAMUSCULAR | Status: AC
  Filled 2022-12-04: qty 1

## 2022-12-04 MED ORDER — DOFETILIDE 125 MCG PO CAPS
250.0000 ug | ORAL_CAPSULE | Freq: Once | ORAL | Status: AC
  Administered 2022-12-04: 250 ug via ORAL
  Filled 2022-12-04: qty 2

## 2022-12-04 MED ORDER — VITAMIN K1 10 MG/ML IJ SOLN
5.0000 mg | Freq: Once | INTRAVENOUS | Status: AC
  Administered 2022-12-04: 5 mg via INTRAVENOUS
  Filled 2022-12-04: qty 0.5

## 2022-12-04 MED ORDER — DOFETILIDE 250 MCG PO CAPS
250.0000 ug | ORAL_CAPSULE | Freq: Once | ORAL | Status: DC
  Filled 2022-12-04: qty 1

## 2022-12-04 MED ORDER — METOPROLOL SUCCINATE ER 50 MG PO TB24
100.0000 mg | ORAL_TABLET | Freq: Every day | ORAL | Status: DC
  Administered 2022-12-04: 100 mg via ORAL
  Filled 2022-12-04: qty 2

## 2022-12-04 NOTE — ED Notes (Signed)
Patient wished to remove BiPAP mask.

## 2022-12-04 NOTE — ED Provider Notes (Signed)
Patient subdural is slightly larger than it was last night.  I spoke with neurosurgery Dr. Kathyrn Sheriff and he felt like the patient could still go home and stay with his family for the next day.  If he has any problems he can return.  He needs to stay off his Coumadin until he is seen by the neurosurgeon in the next 2 to 3 weeks.   Milton Ferguson, MD 12/04/22 1034

## 2022-12-04 NOTE — ED Notes (Signed)
Patient transported to CT 

## 2022-12-04 NOTE — ED Notes (Signed)
Wound dressed and ice pack placed to aid with swelling.

## 2022-12-04 NOTE — Discharge Instructions (Addendum)
Do not take Coumadin (warfarin) until you are followed up by the neurosurgeon and they say it is okay to start back.  You might want to talk with your cardiologist about the risks and benefits of restarting this medication before you restart it.  Return to the emergency department for worsening headache, confusion or strokelike symptoms.  You need to stay with somebody for the next 24 hours to make sure you are doing okay.  Follow-up with the neurosurgeon in the next 2 weeks.  You have been referred to Dr. Kathyrn Sheriff or you can see one of his colleagues

## 2022-12-06 ENCOUNTER — Telehealth: Payer: Self-pay | Admitting: Cardiology

## 2022-12-06 NOTE — Telephone Encounter (Signed)
Spoke with daughter Edson Snowball who stated patient fell and received HI on 1/20. DX with subdural hematoma. Patient is to be off coumadin for 2 weeks and have OV with neurosurgeon. Daughter stated patient missed one dose of Tikosyn while in the ED. Daughter wanted to know if patient could still take lasix and tylenol. Informed her patient can take those, but discussed not giving patient any NSAIDs     (discussed the names of meds). Daughter stated she was going to make appointment with neurosurgeon after talking with me. Discussed neuro signs and symptoms of when EMS would need to be called. She verbalized understanding. Please advise on lasix and tylenol, and if patient can be double-booked to see Dr. Percival Spanish. Current appointment is 2/16.

## 2022-12-06 NOTE — Telephone Encounter (Signed)
New Message:    Daughter called and said patient had a fall on Saturday night and was admitted to the hospital. He was dischargedHe had a big Hematoma on his head. One question is, since they stooped his Coumadin for  2 weeks. Does he still need to come in for his Coumadin tomorrow?    Pt c/o medication issue:  1. Name of Medication: Furosemide  2. How are you currently taking this medication (dosage and times per day)?   3. Are you having a reaction (difficulty breathing--STAT)?   4. What is your medication issue? Does he need to continue taking  this medicine       Pt c/o medication issue:  1. Name of Medication: Extra Strength Tylenol  2. How are you currently taking this medication (dosage and times per day)?   3. Are you having a reaction (difficulty breathing--STAT)?   4. What is your medication issue? Daughter wants to know if it is alright for him to take Tylenol?

## 2022-12-08 ENCOUNTER — Encounter: Payer: Self-pay | Admitting: Internal Medicine

## 2022-12-08 ENCOUNTER — Ambulatory Visit: Payer: Medicare Other | Attending: Internal Medicine | Admitting: Internal Medicine

## 2022-12-08 VITALS — BP 112/56 | HR 67 | Ht 69.0 in | Wt 243.6 lb

## 2022-12-08 DIAGNOSIS — I4729 Other ventricular tachycardia: Secondary | ICD-10-CM | POA: Diagnosis not present

## 2022-12-08 DIAGNOSIS — I4719 Other supraventricular tachycardia: Secondary | ICD-10-CM

## 2022-12-08 DIAGNOSIS — Z950 Presence of cardiac pacemaker: Secondary | ICD-10-CM

## 2022-12-08 DIAGNOSIS — I480 Paroxysmal atrial fibrillation: Secondary | ICD-10-CM | POA: Diagnosis not present

## 2022-12-08 NOTE — Progress Notes (Signed)
Patient ID: Chad [name], male   DOB: [birth year], 89 y.o.   MRN: [id number]      Patient Care Team: Redmond School, MD as PCP - General (Internal Medicine) Minus Breeding, MD as PCP - Cardiology (Cardiology)     Chad [name] is a 89 y.o. male Seen in follow-up for pacing St Jude undertaken 2018 for sinus node dysfunction.  He also has atrial fibrillation nonsustained ventricular tachycardia; anticoagulated with warfarin, (See Below)  significant bleeding.  Currently taking dofetilide-- no  interval atrial fibrillation    The patient denies chest pain, nocturnal dyspnea, orthopnea .  There have been no palpitations, lightheadedness or syncope.  Complains of chronic sob and edema .   Interval fall.  9/23 ECG read (I think in wrongly) to be in atrial fibrillation--atrial pacing with interpolated PACs; converted spontaneously, repeat fall   1/24 this time atrial fibrillation  *none detected subdural hematoma.  Given vitamin K.  Repeat scanning showed mild enlargement.  Coumadin was held.  Outpatient neurosurgical follow-up is anticipated          DATE TEST EF   1/15 Echo   60-65 %   9/21 Echo  55-60%   11/21 Myoview  No ischemia    Date Cr K Mg Hgb  7/18  1.16 4.5 2.2 15.2  11/18  1.06 4.5 2.3   3/20 1.12 4.4  14.0   10/20 1.11 4.4 2.3   8/21 1.07 5.0    9/21 1.34 4.2 2.2   5/22 1.31 4.2 2.5   1/24 1.27 4.0  15.1     Past Medical History:  Diagnosis Date   Bladder cancer ([place]) 2010   Chronic bronchitis ([place])    Hypertension    Hypoglycemia    On home oxygen therapy    "2L w/CPAP at night" (06/21/2017)   OSA on CPAP    "w/2L O2 at night" (06/21/2017)   Persistent atrial fibrillation ([place])        Pneumonia    "when he was young; again in 2016" (06/21/2017)   Presence of permanent cardiac pacemaker    Sensory ataxia     Past Surgical History:  Procedure Laterality Date   BACK SURGERY     CARDIOVERSION N/A 04/23/2015   Procedure: CARDIOVERSION;  Surgeon: [name], MD;  Location: [place];  Service: Cardiovascular;  Laterality: N/A;   CARDIOVERSION N/A 07/30/2015   Procedure: CARDIOVERSION;  Surgeon: [name], MD;  Location: [facility] ENDOSCOPY;  Service: Cardiovascular;  Laterality: N/A;   EYE SURGERY Bilateral 2009   "to lower pressure; no glaucoma" (06/21/2017)   INSERT / REPLACE / REMOVE PACEMAKER  06/21/2017   LUMBAR [place]   PACEMAKER IMPLANT N/A 06/21/2017   Procedure: Pacemaker Implant;  Surgeon: Deboraha Sprang, MD;  Location: [place] CV LAB;  Service: Cardiovascular;  Laterality: N/A;   TRANSURETHRAL RESECTION OF BLADDER TUMOR WITH GYRUS (TURBT-GYRUS)  07/10/2009    Cystourethroscopy, gyrus TURBT (transurethral  resection of bladder tumor).[name] 03/16/2011    Current Outpatient Medications  Medication Sig Dispense Refill   cefUROXime (CEFTIN) 500 MG tablet Take 1 tablet (500 mg total) by mouth 2 (two) times daily with a meal. 14 tablet 0   Cholecalciferol (VITAMIN D3) 2000 units capsule Take 2,000 Units by mouth 2 (two) times daily.     diclofenac Sodium (VOLTAREN) 1 % GEL Apply 4 g topically 4 (four) times daily. 100 g 0   diltiazem (CARDIZEM) 30 MG tablet Take 1  tablet every 8 hours as needed 90 tablet 3   dofetilide (TIKOSYN) 250 MCG capsule Take 1 capsule by mouth twice daily 180 capsule 3   escitalopram (LEXAPRO) 10 MG tablet Take 10 mg by mouth at bedtime.     furosemide (LASIX) 20 MG tablet Take 1 tablet by mouth twice daily 180 tablet 2   irbesartan (AVAPRO) 300 MG tablet Take 1 tablet by mouth once daily (Patient taking differently: Take 150 mg by mouth daily.) 90 tablet 3   metoprolol succinate (TOPROL-XL) 100 MG 24 hr tablet Take 1 tablet (100 mg total) by mouth 2 (two) times daily. 60 tablet 3   Multiple Vitamins-Minerals (CENTRUM SILVER PO) Take 1 tablet by mouth daily.     mupirocin ointment (BACTROBAN) 2 % Apply topically daily as needed.     OXYGEN Inhale 2 L into the lungs at bedtime. Uses with CPAP machine      triamcinolone ointment (KENALOG) 0.1 % Apply 1 application  topically as needed.     vitamin B-12 (CYANOCOBALAMIN) 500 MCG tablet Take 500 mcg by mouth 2 (two) times daily.     warfarin (COUMADIN) 5 MG tablet TAKE 1/2 TO 1 (ONE-HALF TO ONE) TABLET BY MOUTH ONCE DAILY AS DIRECTED BY  COUMADIN  CLINIC (Patient not taking: Reported on 12/08/2022) 90 tablet 0   No current facility-administered medications for this visit.    Allergies  Allergen Reactions   Doxycycline Hives   Amiodarone     Caused neuropathy   Carbapenems    Cephalexin Other (See Comments)   Cephalosporins Other (See Comments)   Chlorpheniramine Other (See Comments)    Swells prostate Swells prostate   Ciprofloxacin     Pt can not take with amiodarone    Decongestant [Oxymetazoline]     All decongestant - makes prostate swell   Diltiazem Swelling    Left leg swelling   Flagyl [Metronidazole] Nausea Only    Stomach ache   Hctz [Hydrochlorothiazide]    Levaquin [Levofloxacin In D5w] Other (See Comments)    unknown   Levofloxacin Nausea Only   Lidocaine     Pt unsure of   Penicillins Swelling    Has patient had a PCN reaction causing immediate rash, facial/tongue/throat swelling, SOB or lightheadedness with hypotension: No Has patient had a PCN reaction causing severe rash involving mucus membranes or skin necrosis: No Has patient had a PCN reaction that required hospitalization: No Has patient had a PCN reaction occurring within the last 10 years: No If all of the above answers are "NO", then may proceed with Cephalosporin use. Swelling at injection site   Teline [Tetracycline] Swelling    Hospitalization for 2 weeks, prostate swelling   Tetanus Toxoid Swelling    Swells arms, Please pre-medicate with benadryl.   Z-Pak [Azithromycin] Other (See Comments)    Pt can not take with Amiodarone    Sulfonamide Derivatives Rash   Toprol Xl [Metoprolol Tartrate] Itching and Rash    Pt states he tolerates.       Review of Systems negative except from HPI and PMH  Physical Exam: BP (!) 112/56   Pulse 67   Ht '5\' 9"'$  (1.753 m)   Wt 243 lb 9.6 oz (110.5 kg)   SpO2 92%   BMI 35.97 kg/m  Well developed and well nourished in no acute distress HENT normal Neck supple with JVP< 10 Clear Device pocket well healed; without hematoma or erythema.  There is no tethering  Regular rate and rhythm,  no  gallop No  murmur Abd-soft with active BS No Clubbing cyanosis 2+ edema Skin-warm and dry A & Oriented  Grossly normal sensory and motor function  ECG atrial pacing at 67 Intervals 28/12/43 with interspersed PACs with interestingly shorter PR intervals  Device function is normal. Programming changes none See Paceart for details    Assessment and  Plan  VT-NS   Atrial fib  RVR  Atrial tachycardia-slow   Sinus bradycardia   Pacemaker /St Jude   Morbidly obese   OSA treated  HFpEF acute/chronic    Pulmonary fibrosis  No interval atrial fibrillation.  See above.  Continue dofetilide.  He is to see A-fib clinic, we will change that appointment from [facility], and then I can see him in 12 months and so A-fib clinic can see him every 12 months and I can alternate.  Alternatively, I can alternate with him with Dr. [facility] who is post to see him next week.     Traumatic subdural hematoma, will be appropriate for him to resume warfarin when released by neurosurgery.  Lengthy discussion regarding all the safety things that his daughter has undertaken to help him maintain his independence.  Well-done  Volume overloaded.  Will increase his furosemide from 40--60 daily for 7 days, he is to see Dr. Lenore Cordia next week, he can reassess.  Hopefully getting the edema out of his feet will help his ambulation, also known as walking:))     s

## 2022-12-08 NOTE — Telephone Encounter (Signed)
Changed appointment from Feb to 1/30.

## 2022-12-08 NOTE — Patient Instructions (Addendum)
Medication Instructions:  Your physician has recommended you make the following change in your medication:   ** Increase Furosemide '20mg'$  - to 3 tablets ('60mg'$ ) daily every morning x 7 days then return to your normal dosing.  *If you need a refill on your cardiac medications before your next appointment, please call your pharmacy*   Lab Work: None ordered.  If you have labs (blood work) drawn today and your tests are completely normal, you will receive your results only by: [place] (if you have MyChart) OR A paper copy in the mail If you have any lab test that is abnormal or we need to change your treatment, we will call you to review the results.   Testing/Procedures: None ordered.    Follow-Up: At [facility], you and your health needs are our priority.  As part of our continuing mission to provide you with exceptional heart care, we have created designated Provider Care Teams.  These Care Teams include your primary Cardiologist (physician) and Advanced Practice Providers (APPs -  Physician Assistants and Nurse Practitioners) who all work together to provide you with the care you need, when you need it.  We recommend signing up for the patient portal called "MyChart".  Sign up information is provided on this After Visit Summary.  MyChart is used to connect with patients for Virtual Visits (Telemedicine).  Patients are able to view lab/test results, encounter notes, upcoming appointments, etc.  Non-urgent messages can be sent to your provider as well.   To learn more about what you can do with MyChart, go to [email or web address].    Your next appointment:   12 months with Dr Caryl Comes

## 2022-12-12 DIAGNOSIS — S065XAA Traumatic subdural hemorrhage with loss of consciousness status unknown, initial encounter: Secondary | ICD-10-CM | POA: Diagnosis not present

## 2022-12-12 DIAGNOSIS — Z6835 Body mass index (BMI) 35.0-35.9, adult: Secondary | ICD-10-CM | POA: Diagnosis not present

## 2022-12-12 NOTE — Progress Notes (Unsigned)
Cardiology Office Note   Date:  12/13/2022   ID:  Chad [name], DOB 25-Mar-1929, MRN [id number]  PCP:  Redmond School, MD  Cardiologist:   Minus Breeding, MD   No chief complaint on file.     History of Present Illness: Chad [name] is a 89 y.o. male who presents for follow up of atrial fib.   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  He has been treated for SVT as well by Dr. Caryl Comes.    He is followed in the Atrial Fib clinic.  He is being treated with Tikosyn.   He has had subdural hematoma for a fall this month..  He has currently off of warfarin.   A few days ago he saw Dr. Caryl Comes.  Dr. Caryl Comes thought he had some increased lower extremity swelling and he had his Lasix increased from 40 to 60 mg a day.  Patient has been drinking a lot of water because he has been short of breath.  He denies any chest pressure, neck or arm discomfort.  He denies any resting shortness of breath and he wears a CPAP at night with 2 L of oxygen.  He will get short of breath if he walks around the house quickly.  But he walks slowly usually with a walker and his oxygen saturations are in the mid 90s.  He denies any chest pressure, neck or arm discomfort.  He denies any palpitations, presyncope or syncope.  His predominant complaint continues to be fatigue.  He is really unchanged compared to previous.   Past Medical History:  Diagnosis Date   Bladder cancer ([place]) 2010   Chronic bronchitis ([place])    Hypertension    Hypoglycemia    On home oxygen therapy    "2L w/CPAP at night" (06/21/2017)   OSA on CPAP    "w/2L O2 at night" (06/21/2017)   Persistent atrial fibrillation ([place])        Pneumonia    "when he was young; again in 2016" (06/21/2017)   Presence of permanent cardiac pacemaker    Sensory ataxia     Past Surgical History:  Procedure Laterality Date   BACK SURGERY     CARDIOVERSION N/A 04/23/2015   Procedure: CARDIOVERSION;  Surgeon: [name], MD;  Location: [place];  Service: Cardiovascular;  Laterality: N/A;   CARDIOVERSION N/A 07/30/2015   Procedure: CARDIOVERSION;  Surgeon: [name], MD;  Location[facility] ENDOSCOPY;  Service: Cardiovascular;  Laterality: N/A;   EYE SURGERY Bilateral 2009   "to lower pressure; no glaucoma" (06/21/2017)   INSERT / REPLACE / REMOVE PACEMAKER  06/21/2017   LUMBAR [place]   PACEMAKER IMPLANT N/A 06/21/2017   Procedure: Pacemaker Implant;  Surgeon: Deboraha Sprang, MD;  Location: [place] CV LAB;  Service: Cardiovascular;  Laterality: N/A;   TRANSURETHRAL RESECTION OF BLADDER TUMOR WITH GYRUS (TURBT-GYRUS)  07/10/2009    Cystourethroscopy, gyrus TURBT (transurethral  resection of bladder tumor).[name] 03/16/2011     Current Outpatient Medications  Medication Sig Dispense Refill   cefUROXime (CEFTIN) 500 MG tablet Take 1 tablet (500 mg total) by mouth 2 (two) times daily with a meal. 14 tablet 0   Cholecalciferol (VITAMIN D3) 2000 units capsule Take 2,000 Units by mouth 2 (two) times daily.     diclofenac Sodium (VOLTAREN) 1 % GEL Apply 4 g topically 4 (four) times daily. 100 g 0   diltiazem (CARDIZEM) 30 MG tablet Take 1  tablet every 8 hours as needed 90 tablet 3   dofetilide (TIKOSYN) 250 MCG capsule Take 1 capsule by mouth twice daily 180 capsule 3   escitalopram (LEXAPRO) 10 MG tablet Take 10 mg by mouth at bedtime.     irbesartan (AVAPRO) 300 MG tablet Take 1 tablet by mouth once daily (Patient taking differently: Take 150 mg by mouth daily.) 90 tablet 3   metoprolol succinate (TOPROL-XL) 100 MG 24 hr tablet Take 1 tablet (100 mg total) by mouth 2 (two) times daily. 60 tablet 3   Multiple Vitamins-Minerals (CENTRUM SILVER PO) Take 1 tablet by mouth daily.     mupirocin ointment (BACTROBAN) 2 % Apply topically daily as needed.     OXYGEN Inhale 2 L into the lungs at bedtime. Uses with CPAP machine     triamcinolone ointment (KENALOG) 0.1 % Apply 1 application  topically as needed.     vitamin B-12  (CYANOCOBALAMIN) 500 MCG tablet Take 500 mcg by mouth 2 (two) times daily.     warfarin (COUMADIN) 5 MG tablet TAKE 1/2 TO 1 (ONE-HALF TO ONE) TABLET BY MOUTH ONCE DAILY AS DIRECTED BY  COUMADIN  CLINIC (Patient taking differently: TAKE 1/2 TO 1 (ONE-HALF TO ONE) TABLET BY MOUTH ONCE DAILY  AS  DIRECTED  BY  COUMADIN  CLINIC) 90 tablet 0   furosemide (LASIX) 20 MG tablet Take 2 tablets (40 mg total) by mouth daily. 180 tablet 2   No current facility-administered medications for this visit.    Allergies:   Doxycycline, Amiodarone, Carbapenems, Cephalexin, Cephalosporins, Chlorpheniramine, Ciprofloxacin, Decongestant [oxymetazoline], Diltiazem, Flagyl [metronidazole], Hctz [hydrochlorothiazide], Levaquin [levofloxacin in d5w], Levofloxacin, Lidocaine, Penicillins, Teline [tetracycline], Tetanus toxoid, Z-pak [azithromycin], Sulfonamide derivatives, and Toprol xl [metoprolol tartrate]    ROS:  Please see the history of present illness.   Otherwise, review of systems are positive for NONE.   All other systems are reviewed and negative.    PHYSICAL EXAM: VS:  BP (!) 147/70 (BP Location: Right Arm, Patient Position: Sitting, Cuff Size: Normal)   Pulse 64   Ht '5\' 9"'$  (1.753 m)   Wt 238 lb 12.8 oz (108.3 kg)   SpO2 93%   BMI 35.26 kg/m  , BMI Body mass index is 35.26 kg/m. GEN:  No distress NECK:  No jugular venous distention at 90 degrees, waveform within normal limits, carotid upstroke brisk and symmetric, no bruits, no thyromegaly LYMPHATICS:  No cervical adenopathy LUNGS:  Clear to auscultation bilaterally BACK:  No CVA tenderness CHEST:  Well healed device pocket. HEART:  S1 and S2 within normal limits, no S3, no S4, no clicks, no rubs, no murmurs ABD:  Positive bowel sounds normal in frequency in pitch, no bruits, no rebound, no guarding, unable to assess midline mass or bruit with the patient seated. EXT:  2 plus pulses throughout, no edema, no cyanosis no clubbing SKIN:  No rashes no  nodules NEURO:  Cranial nerves II through XII grossly intact, motor grossly intact throughout PSYCH:  Cognitively intact, oriented to person place and time   EKG: 12/08/2022 atrial paced rhythm, rate 67, right bundle branch block, first-degree AV block, chronic lateral T wave inversions, premature atrial contractions  Recent Labs: 08/16/2022: Magnesium 2.3 12/03/2022: BUN 18; Creatinine, Ser 1.27; Hemoglobin 15.1; Platelets 164; Potassium 4.0; Sodium 137    Lipid Panel    Component Value Date/Time   CHOL 179 06/10/2010 1617   TRIG 75 06/10/2010 1617   HDL 46 06/10/2010 1617   LDLCALC 118 06/10/2010 1617  Wt Readings from Last 3 Encounters:  12/13/22 238 lb 12.8 oz (108.3 kg)  12/08/22 243 lb 9.6 oz (110.5 kg)  12/03/22 253 lb 1.4 oz (114.8 kg)    Other studies Reviewed: Additional studies/ records that were reviewed today include:  ED records, EP records Review of the above records demonstrates: See elsewhere   ASSESSMENT AND PLAN:   SOB:    This is baseline.  I do not think he is particularly volume overloaded.  He is very thirsty and I have suggested he can go back to the 40 mg of Lasix but should try to reduce his fluid intake.  ATRIAL FIB:  Chad [name] has a CHA2DS2 - VASc score of he remains on anticoagulation.   He is to resume this when he is cleared by neurosurgery.    They are planning another CT in 2 to 3 weeks.  HTN:  The blood pressure is at target.  No change in therapy.  HFpEF:   This will be managed as above.   VT: He had no symptomatic or documented recurrence of this on monitor.   SLEEP APNEA:   He uses his CPAP.    Current medicines are reviewed at length with the patient today.  The patient does not have concerns regarding medicines.  The following changes have been made:   As above  Labs/ tests ordered today include:      No orders of the defined types were placed in this encounter.   Disposition:   FU with me in 4 months.      Signed, Minus Breeding, MD  12/13/2022 12:20 PM    [facility]

## 2022-12-13 ENCOUNTER — Encounter: Payer: Self-pay | Admitting: Cardiology

## 2022-12-13 ENCOUNTER — Ambulatory Visit: Payer: Medicare Other | Attending: Cardiology | Admitting: Cardiology

## 2022-12-13 VITALS — BP 147/70 | HR 64 | Ht 69.0 in | Wt 238.8 lb

## 2022-12-13 DIAGNOSIS — I5033 Acute on chronic diastolic (congestive) heart failure: Secondary | ICD-10-CM

## 2022-12-13 DIAGNOSIS — S065XAA Traumatic subdural hemorrhage with loss of consciousness status unknown, initial encounter: Secondary | ICD-10-CM

## 2022-12-13 DIAGNOSIS — I480 Paroxysmal atrial fibrillation: Secondary | ICD-10-CM

## 2022-12-13 DIAGNOSIS — I10 Essential (primary) hypertension: Secondary | ICD-10-CM | POA: Diagnosis not present

## 2022-12-13 MED ORDER — FUROSEMIDE 20 MG PO TABS: 40.0000 mg | ORAL_TABLET | Freq: Every day | ORAL | 2 refills | Status: DC

## 2022-12-13 NOTE — Patient Instructions (Signed)
Medication Instructions:   REDUCE FUROSEMIDE TO 40 MG ONCE DAILY= 2 OF THE 20 MG TABLETS ONCE DAILY  *If you need a refill on your cardiac medications before your next appointment, please call your pharmacy*    Follow-Up: At [facility], you and your health needs are our priority.  As part of our continuing mission to provide you with exceptional heart care, we have created designated Provider Care Teams.  These Care Teams include your primary Cardiologist (physician) and Advanced Practice Providers (APPs -  Physician Assistants and Nurse Practitioners) who all work together to provide you with the care you need, when you need it.  We recommend signing up for the patient portal called "MyChart".  Sign up information is provided on this After Visit Summary.  MyChart is used to connect with patients for Virtual Visits (Telemedicine).  Patients are able to view lab/test results, encounter notes, upcoming appointments, etc.  Non-urgent messages can be sent to your provider as well.   To learn more about what you can do with MyChart, go to [email or web address].    Your next appointment:   4 month(s)  Provider:   Minus Breeding, MD

## 2022-12-15 ENCOUNTER — Other Ambulatory Visit (HOSPITAL_COMMUNITY): Payer: Self-pay | Admitting: Neurosurgery

## 2022-12-15 DIAGNOSIS — S065XAA Traumatic subdural hemorrhage with loss of consciousness status unknown, initial encounter: Secondary | ICD-10-CM

## 2022-12-22 ENCOUNTER — Encounter (HOSPITAL_COMMUNITY): Payer: Self-pay | Admitting: *Deleted

## 2022-12-23 NOTE — Progress Notes (Signed)
Remote pacemaker transmission.   

## 2022-12-30 ENCOUNTER — Ambulatory Visit: Payer: Medicare Other | Admitting: Cardiology

## 2023-01-02 ENCOUNTER — Ambulatory Visit (HOSPITAL_COMMUNITY)
Admission: RE | Admit: 2023-01-02 | Discharge: 2023-01-02 | Disposition: A | Discharge status: Home / Self Care | Payer: Medicare Other | Source: Ambulatory Visit | Attending: Neurosurgery | Admitting: Neurosurgery

## 2023-01-02 DIAGNOSIS — S065XAA Traumatic subdural hemorrhage with loss of consciousness status unknown, initial encounter: Secondary | ICD-10-CM | POA: Insufficient documentation

## 2023-01-02 DIAGNOSIS — S065X0A Traumatic subdural hemorrhage without loss of consciousness, initial encounter: Secondary | ICD-10-CM | POA: Diagnosis not present

## 2023-01-04 ENCOUNTER — Telehealth: Payer: Self-pay | Admitting: Cardiology

## 2023-01-04 DIAGNOSIS — I4891 Unspecified atrial fibrillation: Secondary | ICD-10-CM | POA: Diagnosis not present

## 2023-01-04 DIAGNOSIS — I4819 Other persistent atrial fibrillation: Secondary | ICD-10-CM

## 2023-01-04 DIAGNOSIS — G4733 Obstructive sleep apnea (adult) (pediatric): Secondary | ICD-10-CM | POA: Diagnosis not present

## 2023-01-04 DIAGNOSIS — S065XAA Traumatic subdural hemorrhage with loss of consciousness status unknown, initial encounter: Secondary | ICD-10-CM | POA: Diagnosis not present

## 2023-01-04 NOTE — Telephone Encounter (Signed)
Pt c/o medication issue:  1. Name of Medication: warfarin (COUMADIN) 5 MG tablet   2. How are you currently taking this medication (dosage and times per day)?   3. Are you having a reaction (difficulty breathing--STAT)?   4. What is your medication issue? Patient's daughter called stating her father got the all clear from Dr. Diamantina Monks the neurosurgeon.  She wants to know when her father can start back taking warfarin.  He was off of it because he had taken a bad fall.  Please advise.

## 2023-01-05 NOTE — Telephone Encounter (Signed)
Daughter is calling for an update. Please advise

## 2023-01-05 NOTE — Telephone Encounter (Signed)
Spoke with daughter who gave information regarding neurosurgeon Dr. Consuella Lose. Left message with Jacqlyn Larsen Control and instrumentation engineer) to fax yesterday's OV to [phone]. This is in regard to coumadin restart.

## 2023-01-05 NOTE — Telephone Encounter (Signed)
Becky from Dr. Cleotilde Neer office stated the OV notes have not been dictated yet, and she will f/u tomorrow with surgeon.

## 2023-01-06 NOTE — Telephone Encounter (Signed)
Returned call to patients daughter who states that she is just waiting to hear back regarding patient starting warfarin back. Patients daughter reports that head CT is completed- this is available in Epic. They are also having neurosurgery note faxed over as well. Advised patient I would forward for Dr. Percival Spanish for him to review. Patients daughter verbalized understanding.

## 2023-01-06 NOTE — Telephone Encounter (Signed)
Patient's daughter is following up requesting an update regarding the patient going back on Warfarin.

## 2023-01-13 NOTE — Telephone Encounter (Signed)
Spoke with daughter, patient can start back tonight at previous dose 2.5 mg qd x 5 mg Thursdays.  INR scheduled with Edrick Oh in [place] office for Wed March 6 at 2:15pm

## 2023-01-13 NOTE — Telephone Encounter (Signed)
Called and spoke to dtr [facility]) to let her know information was forwarded to coumadin clinic per Dr. Percival Spanish so coumadin could be restarted. Referral placed. Daughter verbalizes understanding and states her cell phone number [phone]) is the best number to call for scheduling any appointments.

## 2023-01-13 NOTE — Telephone Encounter (Signed)
Called Dr. Cleotilde Neer office [facility] Neurosurgery) and requested most recent note regarding restarting coumadin s/p SDH be faxed over. Received, routed to Dr. Percival Spanish.

## 2023-01-13 NOTE — Telephone Encounter (Signed)
Pt's daughter calling to f/u on Warfarin. She states that Neurosurgereon's office has confirmed that notes have been sent. Daughter says that pt has been without medication since 1/20. She is requesting a callback regarding this matter ASAP. Please advise

## 2023-01-18 ENCOUNTER — Ambulatory Visit: Payer: Medicare Other | Attending: Internal Medicine | Admitting: *Deleted

## 2023-01-18 DIAGNOSIS — I4819 Other persistent atrial fibrillation: Secondary | ICD-10-CM | POA: Diagnosis not present

## 2023-01-18 DIAGNOSIS — Z5181 Encounter for therapeutic drug level monitoring: Secondary | ICD-10-CM | POA: Diagnosis not present

## 2023-01-18 LAB — POCT INR: INR: 1.4 — AB (ref 2.0–3.0)

## 2023-01-18 NOTE — Patient Instructions (Signed)
Has been off warfarin Since Jan due to fall with subdural hematoma. Restarted warfarin on 01/16/23 taking 2.'5mg'$  daily except '5mg'$  on Thursdays Continue warfarin 1/2 tablet daily except 1 tablet on Thursdays Recheck in 1 week

## 2023-01-19 ENCOUNTER — Other Ambulatory Visit: Payer: Self-pay | Admitting: *Deleted

## 2023-01-19 ENCOUNTER — Encounter: Payer: Self-pay | Admitting: Neurology

## 2023-01-19 ENCOUNTER — Ambulatory Visit: Payer: Medicare Other | Admitting: Neurology

## 2023-01-19 VITALS — BP 154/78 | HR 63 | Ht 69.0 in | Wt 238.0 lb

## 2023-01-19 DIAGNOSIS — Z9981 Dependence on supplemental oxygen: Secondary | ICD-10-CM

## 2023-01-19 DIAGNOSIS — Z7189 Other specified counseling: Secondary | ICD-10-CM

## 2023-01-19 DIAGNOSIS — I4819 Other persistent atrial fibrillation: Secondary | ICD-10-CM

## 2023-01-19 DIAGNOSIS — G620 Drug-induced polyneuropathy: Secondary | ICD-10-CM

## 2023-01-19 DIAGNOSIS — G4731 Primary central sleep apnea: Secondary | ICD-10-CM

## 2023-01-19 MED ORDER — IRBESARTAN 150 MG PO TABS: 150.0000 mg | ORAL_TABLET | Freq: Every day | ORAL | 3 refills | Status: DC

## 2023-01-19 NOTE — Patient Instructions (Signed)
Atrial Fibrillation Atrial fibrillation (AFib) is a type of irregular or rapid heartbeat (arrhythmia). In AFib, the top part of the heart (atria) beats in an irregular pattern. This makes the heart unable to pump blood normally and effectively. The goal of treatment is to prevent blood clots from forming, control your heart rate, or restore your heartbeat to a normal rhythm. If this condition is not treated, it can cause serious problems, such as a weakened heart muscle (cardiomyopathy) or a stroke. What are the causes? This condition is often caused by medical conditions that damage the heart's electrical system. These include: High blood pressure (hypertension). This is the most common cause. Certain heart problems or conditions, such as heart failure, coronary artery disease, heart valve problems, or heart surgery. Diabetes. Overactive thyroid (hyperthyroidism). Chronic kidney disease. Certain lung conditions, such as emphysema, pneumonia, or COPD. Obstructive sleep apnea. In some cases, the cause of this condition is not known. What increases the risk? This condition is more likely to develop in: Older adults. Athletes who do endurance exercise. People who have a family history of AFib. Males. People who are Caucasian. People who are obese. People who smoke or misuse alcohol. What are the signs or symptoms? Symptoms of this condition include: Fast or irregular heartbeats (palpitations). Discomfort or pain in your chest. Shortness of breath. Sudden light-headedness or weakness. Tiring easily during exercise or activity. Syncope (fainting). Sweating. In some cases, there are no symptoms. How is this diagnosed? Your health care provider may detect AFib when taking your pulse. If detected, this condition may be diagnosed with: An electrocardiogram (ECG) to check electrical signals of the heart. An ambulatory cardiac monitor to record your heart's activity for a few days. A  transthoracic echocardiogram (TTE) to create pictures of your heart. A transesophageal echocardiogram (TEE) to create even clearer pictures of your heart. A stress test to check your blood supply while you exercise. Imaging tests, such as a CT scan or chest X-ray. Blood tests. How is this treated? Treatment depends on underlying conditions and how you feel when you get AFib. This condition may be treated with: Medicines to prevent blood clots or to treat heart rate or heart rhythm problems. Electrical cardioversion to reset the heart's rhythm. A pacemaker to correct abnormal heart rhythm. Ablation to remove the heart tissue that sends abnormal signals. Left atrial appendage closure to seal the area where blood clots can form. In some cases, underlying conditions will be treated. Follow these instructions at home: Medicines Take over-the counter and prescription medicines only as told by your provider. Do not take any new medicines without talking to your provider. If you are taking blood thinners: Talk with your provider before taking aspirin or NSAIDs. These medicines can raise your risk of bleeding. Take your medicines as told. Take them at the same time each day. Do not do things that could hurt or bruise you. Be careful to avoid falls. Wear an alert bracelet or carry a card that says that you take blood thinners. Lifestyle Do not use any products that contain nicotine or tobacco. These products include cigarettes, chewing tobacco, and vaping devices, such as e-cigarettes. If you need help quitting, ask your provider. Eat heart-healthy foods. Talk with a food expert (dietitian) to make an eating plan that is right for you. Exercise regularly as told by your provider. Do not drink alcohol. Lose weight if you are overweight. General instructions If you have obstructive sleep apnea, manage your condition as told by your provider.  Do not use diet pills unless your provider approves. Diet  pills can make heart problems worse. Keep all follow-up visits. Your provider will want to check your heart rate and rhythm regularly. Contact a health care provider if: You notice a change in the rate, rhythm, or strength of your heartbeat. You are taking a blood thinner and you notice more bruising. You tire more easily when you exercise or do heavy work. You have a sudden change in weight. Get help right away if:  You have chest pain. You have trouble breathing. You have side effects of blood thinners, such as blood in your vomit, poop (stool), or pee (urine), or bleeding that does not stop. You have any symptoms of a stroke. "BE FAST" is an easy way to remember the main warning signs of a stroke: B - Balance. Signs are dizziness, sudden trouble walking, or loss of balance. E - Eyes. Signs are trouble seeing or a sudden change in vision. F - Face. Signs are sudden weakness or numbness of the face, or the face or eyelid drooping on one side. A - Arms. Signs are weakness or numbness in an arm. This happens suddenly and usually on one side of the body. S - Speech.Signs are sudden trouble speaking, slurred speech, or trouble understanding what people say. T - Time. Time to call emergency services. Write down what time symptoms started. Other signs of a stroke, such as: A sudden, severe headache with no known cause. Nausea or vomiting. Seizure. These symptoms may be an emergency. Get help right away. Call 911. Do not wait to see if the symptoms will go away. Do not drive yourself to the hospital. This information is not intended to replace advice given to you by your health care provider. Make sure you discuss any questions you have with your health care provider. Document Revised: 07/20/2022 Document Reviewed: 07/20/2022 Elsevier Patient Education  [employer].

## 2023-01-19 NOTE — Progress Notes (Signed)
SLEEP CLINIC     Provider:  Larey Seat, MD  Primary Care Physician:  [employer] [employer], [place] [id number]     Referring Provider: [employer] [employer], [address] [place],  [place] [address]          Chief Complaint according to patient   Patient presents with:     New Patient (Initial Visit)     ASV user, 89 years old, here with data from the last 90 days.       HISTORY OF PRESENT ILLNESS:  Chad [name] is a 89 y.o. male patient who is here for revisit 01/19/2023 for follow up on ASV, sleep apnea. .  Chief concern according to patient :  I was supposed to come back to sleep clinic for follow up" his daughter is by phone connected during this visit.   Highly compliant ASV user , who has trouble with air leaks, related f to facial hair and partial dentures,      Review of Systems: Out of a complete 14 system review, the patient complains of only the following symptoms, and all other reviewed systems are negative.:  Fatigue, sleepiness , snoring, fragmented sleep, Nocturia,   knee joint degenerative joint disease right.  Neuropathy fro decades. Walks with walker.    How likely are you to doze in the following situations: 0 = not likely, 1 = slight chance, 2 = moderate chance, 3 = high chance   Sitting and Reading? Watching Television? Sitting inactive in a public place (theater or meeting)? As a passenger in a car for an hour without a break? Lying down in the afternoon when circumstances permit? Sitting and talking to someone? Sitting quietly after lunch without alcohol? In a car, while stopped for a few minutes in traffic?   Total = 3/ 24 points   FSS endorsed at 27/ 63 points.   Social History   Socioeconomic History   Marital status: Widowed    Spouse name: [name]   Number of children: 2   Years of education: 16   Highest education level: Not on file  Occupational History    Employer: RETIRED  Tobacco Use    Smoking status: Former    Packs/day: 0.50    Years: 20.00    Total pack years: 10.00    Types: Cigarettes    Quit date: 06/03/1971    Years since quitting: 51.6    Passive exposure: Past   Smokeless tobacco: Never   Tobacco comments:    Former smoker 08/16/22  Vaping Use   Vaping Use: Never used  Substance and Sexual Activity   Alcohol use: No   Drug use: No   Sexual activity: Not on file  Other Topics Concern   Not on file  Social History Narrative   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   Patient has two children.   Patient is retired.   Patient has a college education.   Patient is right-handed.   Patient does not drink any caffeine.   Social Determinants of Health   Financial Resource Strain: Not on file  Food Insecurity: Not on file  Transportation Needs: Not on file  Physical Activity: Not on file  Stress: Not on file  Social Connections: Not on file    Family History  Problem Relation Age of Onset   Heart failure Father    Hypertension Father    Dementia Mother  Past Medical History:  Diagnosis Date   Bladder cancer ([place]) 2010   Chronic bronchitis ([place])    Hypertension    Hypoglycemia    On home oxygen therapy    "2L w/CPAP at night" (06/21/2017)   OSA on CPAP    "w/2L O2 at night" (06/21/2017)   Persistent atrial fibrillation ([place])        Pneumonia    "when he was young; again in 2016" (06/21/2017)   Presence of permanent cardiac pacemaker    Sensory ataxia     Past Surgical History:  Procedure Laterality Date   BACK SURGERY     CARDIOVERSION N/A 04/23/2015   Procedure: CARDIOVERSION;  Surgeon: [name], MD;  Location: [facility];  Service: Cardiovascular;  Laterality: N/A;   CARDIOVERSION N/A 07/30/2015   Procedure: CARDIOVERSION;  Surgeon: [name], MD;  Location: [facility] ENDOSCOPY;  Service: Cardiovascular;  Laterality: N/A;   EYE SURGERY Bilateral 2009   "to lower pressure; no glaucoma" (06/21/2017)   INSERT /  REPLACE / REMOVE PACEMAKER  06/21/2017   LUMBAR [place]   PACEMAKER IMPLANT N/A 06/21/2017   Procedure: Pacemaker Implant;  Surgeon: Deboraha Sprang, MD;  Location: [facility];  Service: Cardiovascular;  Laterality: N/A;   TRANSURETHRAL RESECTION OF BLADDER TUMOR WITH GYRUS (TURBT-GYRUS)  07/10/2009    Cystourethroscopy, gyrus TURBT (transurethral  resection of bladder tumor).[name] 03/16/2011     Current Outpatient Medications on File Prior to Visit  Medication Sig Dispense Refill   cefUROXime (CEFTIN) 500 MG tablet Take 1 tablet (500 mg total) by mouth 2 (two) times daily with a meal. 14 tablet 0   Cholecalciferol (VITAMIN D3) 2000 units capsule Take 2,000 Units by mouth 2 (two) times daily.     diclofenac Sodium (VOLTAREN) 1 % GEL Apply 4 g topically 4 (four) times daily. 100 g 0   diltiazem (CARDIZEM) 30 MG tablet Take 1 tablet every 8 hours as needed 90 tablet 3   dofetilide (TIKOSYN) 250 MCG capsule Take 1 capsule by mouth twice daily 180 capsule 3   escitalopram (LEXAPRO) 10 MG tablet Take 10 mg by mouth at bedtime.     furosemide (LASIX) 20 MG tablet Take 2 tablets (40 mg total) by mouth daily. 180 tablet 2   metoprolol succinate (TOPROL-XL) 100 MG 24 hr tablet Take 1 tablet (100 mg total) by mouth 2 (two) times daily. 60 tablet 3   Multiple Vitamins-Minerals (CENTRUM SILVER PO) Take 1 tablet by mouth daily.     mupirocin ointment (BACTROBAN) 2 % Apply topically daily as needed.     OXYGEN Inhale 2 L into the lungs at bedtime. Uses with CPAP machine     triamcinolone ointment (KENALOG) 0.1 % Apply 1 application  topically as needed.     vitamin B-12 (CYANOCOBALAMIN) 500 MCG tablet Take 500 mcg by mouth 2 (two) times daily.     warfarin (COUMADIN) 5 MG tablet TAKE 1/2 TO 1 (ONE-HALF TO ONE) TABLET BY MOUTH ONCE DAILY AS DIRECTED BY  COUMADIN  CLINIC (Patient taking differently: TAKE 1/2 TO 1 (ONE-HALF TO ONE) TABLET BY MOUTH ONCE DAILY  AS  DIRECTED  BY  COUMADIN  CLINIC)  90 tablet 0   No current facility-administered medications on file prior to visit.    Allergies  Allergen Reactions   Doxycycline Hives   Amiodarone     Caused neuropathy   Carbapenems    Cephalexin Other (See Comments)   Cephalosporins Other (See  Comments)   Chlorpheniramine Other (See Comments)    Swells prostate Swells prostate   Ciprofloxacin     Pt can not take with amiodarone    Decongestant [Oxymetazoline]     All decongestant - makes prostate swell   Diltiazem Swelling    Left leg swelling   Flagyl [Metronidazole] Nausea Only    Stomach ache   Hctz [Hydrochlorothiazide]    Levaquin [Levofloxacin In D5w] Other (See Comments)    unknown   Levofloxacin Nausea Only   Lidocaine     Pt unsure of   Penicillins Swelling    Has patient had a PCN reaction causing immediate rash, facial/tongue/throat swelling, SOB or lightheadedness with hypotension: No Has patient had a PCN reaction causing severe rash involving mucus membranes or skin necrosis: No Has patient had a PCN reaction that required hospitalization: No Has patient had a PCN reaction occurring within the last 10 years: No If all of the above answers are "NO", then may proceed with Cephalosporin use. Swelling at injection site   Teline [Tetracycline] Swelling    Hospitalization for 2 weeks, prostate swelling   Tetanus Toxoid Swelling    Swells arms, Please pre-medicate with benadryl.   Z-Pak [Azithromycin] Other (See Comments)    Pt can not take with Amiodarone    Sulfonamide Derivatives Rash   Toprol Xl [Metoprolol Tartrate] Itching and Rash    Pt states he tolerates.     DIAGNOSTIC DATA (LABS, IMAGING, TESTING) - I reviewed patient records, labs, notes, testing and imaging myself where available.  Lab Results  Component Value Date   WBC 6.8 12/03/2022   HGB 15.1 12/03/2022   HCT 47.4 12/03/2022   MCV 92.9 12/03/2022   PLT 164 12/03/2022      Component Value Date/Time   NA 137 12/03/2022 2355   NA  145 (H) 04/22/2021 1423   K 4.0 12/03/2022 2355   CL 99 12/03/2022 2355   CO2 28 12/03/2022 2355   GLUCOSE 97 12/03/2022 2355   BUN 18 12/03/2022 2355   BUN 15 04/22/2021 1423   CREATININE 1.27 (H) 12/03/2022 2355   CREATININE 1.16 (H) 06/12/2017 1635   CALCIUM 9.0 12/03/2022 2355   PROT 6.4 (L) 01/18/2019 1352   ALBUMIN 3.3 (L) 01/18/2019 1352   AST 26 01/18/2019 1352   ALT 16 01/18/2019 1352   ALKPHOS 78 01/18/2019 1352   BILITOT 0.5 01/18/2019 1352   GFRNONAA 53 (L) 12/03/2022 2355   GFRAA 54 (L) 08/04/2020 1626   Lab Results  Component Value Date   CHOL 179 06/10/2010   HDL 46 06/10/2010   LDLCALC 118 06/10/2010   TRIG 75 06/10/2010   No results found for: "HGBA1C" No results found for: "VITAMINB12" Lab Results  Component Value Date   TSH 2.062 04/17/2015    PHYSICAL EXAM:  Today's Vitals   01/19/23 1436  BP: (!) 154/78  Pulse: 63  Weight: 238 lb (108 kg)  Height: '5\' 9"'$  (1.753 m)   Body mass index is 35.15 kg/m.   Wt Readings from Last 3 Encounters:  01/19/23 238 lb (108 kg)  12/13/22 238 lb 12.8 oz (108.3 kg)  12/08/22 243 lb 9.6 oz (110.5 kg)     Ht Readings from Last 3 Encounters:  01/19/23 '5\' 9"'$  (1.753 m)  12/13/22 '5\' 9"'$  (1.753 m)  12/08/22 '5\' 9"'$  (1.753 m)      General: The patient is awake, alert and appears not in acute distress. The patient is groomed. Head: Normocephalic, atraumatic. Neck  is supple. Dentures.  Cardiovascular:  irregular rate and cardiac rhythm by pulse,  without distended neck veins. Respiratory: Lungs are clear to auscultation.  Skin:  With evidence of ankle edema, left more than right  Trunk: The patient's posture is erect.   NEUROLOGIC EXAM: The patient is awake and alert, oriented to place and time.   Memory subjective described as intact.  Attention span & concentration ability appears normal.  Speech is fluent,  with dysphonia , no aphasia.  Mood and affect are appropriate.   Cranial nerves:reports olfactory  misperception.  Pupils are equal and briskly reactive to light. Funduscopic exam deferred.  Extraocular movements in vertical and horizontal planes were intact and without nystagmus. No Diplopia. Visual fields by finger perimetry are intact. Hearing was intact to soft voice .  Facial sensation intact to fine touch.  Facial motor strength is asymmetric- he has a very asymmetric jaw - had dental surgery as a child,  Neck ROM : rotation, tilt and flexion extension were normal for age and shoulder shrug was symmetrical.    Motor exam:  Symmetric bulk, tone and ROM.  hip pain and lower back.  Normal tone without cogwheeling, symmetric grip strength . Sensory:  Fine touch, pinprick and vibration were absent in both ankles and all toes. Proprioception tested in the upper extremities was normal. Coordination: Rapid alternating movements in the fingers/hands were of normal speed.  The Finger-to-nose maneuver was intact without evidence of ataxia, dysmetria or tremor.  Gait and station: Patient could not rise unassisted from a seated position, he walked with a walker as  assistive device.  Deep tendon reflexes: in the  upper  extremities are symmetrically attenuated, trace only.  Patella is absent.      ASSESSMENT AND PLAN 89 y.o. year old male  here with:  Mr. Slyman has used his ASV machine 99 out of 100 days with a compliance of 93%.  His average user time is 7 hours and 32 minutes.  His ASV has the settings of EPAP expiratory pressure assist of 8 cm water pressure, minimum pressure support of 2 and a maximum pressure support of 13 cm water.  He does have high air leakage which is partially attributed to his irregular dentition and partially due to facial hair.  It would be difficult to find a good air seal.  His residual AHI is 10.3 and I would consider this moderate.  In comparison to his baseline he has still a reduction of apnea of over 80%.  EKG was highly irregular with a fib, paced  rhythm. DIAGNOSIS 1.        Complex and Central Sleep Apnea with persistent hypoxemia on PAP required 2 liter of oxygen supplementation for Sleep Related Hypoxemia 1.        The patient responded to ASV, not CPAP, BiPAP or BiPAP ST. 2.        With ASV, the final setting was 15 cm water maximum pressure support, 10 cm EPAP /4 cm water minimum pressure support- with the use of a FFM ResMed 30 Medium.  3.        Clear evidence of REM sleep BD.     1) central apnea on ASV , cofactor is atrial fibrillation, on tikosyn. He lalso had hypoxia while untreated .   2) Neuropathy , exacerbated under amiodarone.   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   No changes in settings were needed, compliance to ASV is  excellent.     I plan to follow up either personally or through our NP within 12 months.   I would like to thank [employer], MD and [employer], [name],  [place] [address] for allowing me to meet with and to take care of this pleasant patient.   CC: I will share my notes with PCP.  After spending a total time of  35  minutes face to face and additional time for physical and neurologic examination, review of laboratory studies,  personal review of imaging studies, reports and results of other testing and review of referral information / records as far as provided in visit,   Electronically signed by: Larey Seat, MD 01/19/2023 3:09 PM  [facility] Board certified by The [employer] of Sleep Medicine and Diplomate of the [employer] of Sleep Medicine. Board certified In Neurology through the [place], Fellow of the [employer] of Neurology. Medical Director of [facility].

## 2023-01-25 ENCOUNTER — Ambulatory Visit: Payer: Medicare Other | Attending: Cardiology | Admitting: *Deleted

## 2023-01-25 DIAGNOSIS — Z5181 Encounter for therapeutic drug level monitoring: Secondary | ICD-10-CM

## 2023-01-25 DIAGNOSIS — I4819 Other persistent atrial fibrillation: Secondary | ICD-10-CM | POA: Diagnosis not present

## 2023-01-25 LAB — POCT INR: INR: 3.1 — AB (ref 2.0–3.0)

## 2023-01-25 NOTE — Patient Instructions (Signed)
Had been off warfarin Since Jan due to fall with subdural hematoma. Restarted warfarin on 01/16/23 taking 2.'5mg'$  daily except '5mg'$  on Thursdays Decrease warfarin to 1/2 tablet daily Recheck in 3 weeks

## 2023-01-30 ENCOUNTER — Ambulatory Visit (HOSPITAL_COMMUNITY)
Admission: RE | Admit: 2023-01-30 | Discharge: 2023-01-30 | Disposition: A | Discharge status: Home / Self Care | Payer: Medicare Other | Source: Ambulatory Visit | Attending: Nurse Practitioner | Admitting: Nurse Practitioner

## 2023-01-30 VITALS — BP 150/72 | HR 63 | Ht 69.0 in | Wt 241.4 lb

## 2023-01-30 DIAGNOSIS — R9431 Abnormal electrocardiogram [ECG] [EKG]: Secondary | ICD-10-CM | POA: Diagnosis not present

## 2023-01-30 DIAGNOSIS — Z79899 Other long term (current) drug therapy: Secondary | ICD-10-CM | POA: Insufficient documentation

## 2023-01-30 DIAGNOSIS — I10 Essential (primary) hypertension: Secondary | ICD-10-CM | POA: Insufficient documentation

## 2023-01-30 DIAGNOSIS — Z950 Presence of cardiac pacemaker: Secondary | ICD-10-CM | POA: Diagnosis not present

## 2023-01-30 DIAGNOSIS — I4819 Other persistent atrial fibrillation: Secondary | ICD-10-CM | POA: Diagnosis not present

## 2023-01-30 DIAGNOSIS — Z7901 Long term (current) use of anticoagulants: Secondary | ICD-10-CM | POA: Insufficient documentation

## 2023-01-30 LAB — BASIC METABOLIC PANEL
Anion gap: 6 (ref 5–15)
BUN: 17 mg/dL (ref 8–23)
CO2: 31 mmol/L (ref 22–32)
Calcium: 9.6 mg/dL (ref 8.9–10.3)
Chloride: 100 mmol/L (ref 98–111)
Creatinine, Ser: 1.32 mg/dL — ABNORMAL HIGH (ref 0.61–1.24)
GFR, Estimated: 51 mL/min — ABNORMAL LOW (ref >60)
Glucose, Bld: 75 mg/dL (ref 70–99)
Potassium: 4.3 mmol/L (ref 3.5–5.1)
Sodium: 137 mmol/L (ref 135–145)

## 2023-01-30 LAB — MAGNESIUM: Magnesium: 2.3 mg/dL (ref 1.7–2.4)

## 2023-01-30 MED ORDER — IRBESARTAN 150 MG PO TABS: 75.0000 mg | ORAL_TABLET | Freq: Every evening | ORAL | Status: DC

## 2023-01-30 NOTE — Progress Notes (Signed)
Patient ID: Chad [name], male   DOB: [birth year], 89 y.o.   MRN: [id number]     Primary Care Physician: Redmond School, MD Referring Physician: [facility] f/u   COEHN BANNER is a 89 y.o. 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 He was  started on tikosyn 9/16 and was seen in the afib clinc for f/u in January. He was in SR, no complaints. He had prolonged qtc that day, reviewed with Dr. Caryl Comes and he felt QTC was ok. EKG repeated one week and qtc acceptable.Pt was not having any  afib that pt was aware of. He had been feeling well. He denies any new drugs or change in previous drugs. No change in general health.  He was seen in the afib clinic today, 3/16 due to complaints of having vision dim for a few seconds at a time. He feels liks he could pass out. No syncope. Started a couple of weeks ago. He can be sitting or standing. He is not aware of any sensation of heart racing or skipping with episodes. He was aware of heart racing, for short period of time last night but did not have any episodes of vision dimming with it. He has been instructed not to drive until the origin of these episodes can be further determined.30 day monitor was placed. Within a few days of wearing the monitor, an episode of elevated heart beat was noted, afib with RVR. Metoprolol was added at 25 mg a day and for the remainder of the monitor, there was no further elevated heart rate or episodes of feeling like he would pass out. No significant brady or pauses.   Returns 8/8 and feels well. No episodes of afib that he is aware of and no further episodes of RVR causing presyncopal rhythms, since starting back on metoprolol ER 25 mg a day. HR is office is 52 bpm, but he is not symptomatic with this. Has some issues with peripheral neuropathy but no falls. Continues on tikosyn bid and is compliant with warfarin.  F/u 12/21. He reports that he has had some  lightheaded spells but has not had any syncope. He just finished wearing an event monitor for these symptoms, placed by Dr.Hochrein, and pt will mail back today. Last time pt had these symptoms, monitor showed brief episodes of rapid afib and rate control was increased. He continues on Tikosyn with warfarin.  F/u 3/22. He has felt well. He had one episode of tachycardia but was associated with climbing 8 flights of stairs. No further dizziness spells. Last time he saw Dr. Caryl Comes, he talked to Dr. Percival Spanish re possibly stopping dofetilide and possibility of a PPM. Pt does f/u with Dr. Caryl Comes 4/9 at which time this will be further discussed. Pt was leaving for [facility] right after that appointment.He is slow today at 48 bpm.  F/u in afib clinic 08/04/20. I have not seen pt since 2018. He is here as he had some elevated HR last week. He saw his PCP who increased his metoprolol succinate from  50 mg bid to 100 mg bid last Wednesday. He has not seen any elevated HR since then. He appears to be tolerating this increased dose. He continues on dofetilide 250 mcg bid. He saw Dr. Percival Spanish in August with increased shortness of breath and pedal edema. His lasix was increased. He was found toan URI at that time.  He was not having any  arrhythmia then. Recent echo showed normal EF, but technically challenging study. Today, he feels well. He is in an a paced rhythm. He remains on warfarin  for a CHA2DS2VASc of at least 4.   F/u in afib clinic, 03/04/21. He was referred by Dr. Rosezella [place] office for sensation of palpitations and feeling unwell for a few days this week, seeing HR's up to 155 bpm at night.. He states that he notices this when he lies down to sleep and will make his left arm hurt if it is sustained. He states for several hours last night,  he felt his heart racing but today, his ekg  shows atrial paced with PAC's. Device interrogated and interrupted by [employer] and Mickel Baas  can only see a HR of 155 bpm x 44 sec's last  night. The last sustained elevate HR was atrial tach at 150-160 April 11th. Possibly afib at 150 bpm for 8-9 hours March 22 nd. They will lower the detection rate to 120 as they feel the current  detection limit at 145 bpm is possibly not capturing a lot of what pt is feeling. His BB was reduced back in December as it was felt that he was doing well, so plan to increase BB  today. He continues on dofetilide 250 mcg bid with stable qt.   F/u in afib clinic, as pt noted symptoms of afib since Monday and device clinic confirmed he was in afib and referred to afib clinic. Fortunately, today, he is back in SR. No recent trigger identified. He is complaint with tikosyn 250 mcg bid. He is now a paced by EKG. Otherwise, no complaints. He is on warfarin.   F/u in afib clinic, 01/30/23. He had a fall with a subdural hematoma, 01/24, warfarin was held for a period of time, but now is back on it since early March. EKG shows he is in a paced rhythm. He continues on dofetilide 250 mcg bid. Labs drawn for Tikosyn surveillance and qt is stable. He saw Dr. Caryl Comes in January with no changes other than increase of lasix for several days.he is here with his daughter today.   Today, he denies symptoms of palpitations, chest pain, shortness of breath, orthopnea, PND, lower extremity edema, dizziness, presyncope, syncope, or neurologic sequela. The patient is tolerating medications without difficulties and is otherwise without complaint today.   Past Medical History:  Diagnosis Date   Bladder cancer ([place]) 2010   Chronic bronchitis ([place])    Hypertension    Hypoglycemia    On home oxygen therapy    "2L w/CPAP at night" (06/21/2017)   OSA on CPAP    "w/2L O2 at night" (06/21/2017)   Persistent atrial fibrillation ([place])        Pneumonia    "when he was young; again in 2016" (06/21/2017)   Presence of permanent cardiac pacemaker    Sensory ataxia    Past Surgical History:  Procedure Laterality Date   BACK SURGERY      CARDIOVERSION N/A 04/23/2015   Procedure: CARDIOVERSION;  Surgeon: [name], MD;  Location: [place];  Service: Cardiovascular;  Laterality: N/A;   CARDIOVERSION N/A 07/30/2015   Procedure: CARDIOVERSION;  Surgeon: [name], MD;  Location[facility] ENDOSCOPY;  Service: Cardiovascular;  Laterality: N/A;   EYE SURGERY Bilateral 2009   "to lower pressure; no glaucoma" (06/21/2017)   INSERT / REPLACE / REMOVE PACEMAKER  06/21/2017   LUMBAR [place]   PACEMAKER IMPLANT N/A 06/21/2017  Procedure: Pacemaker Implant;  Surgeon: Deboraha Sprang, MD;  Location: [facility];  Service: Cardiovascular;  Laterality: N/A;   TRANSURETHRAL RESECTION OF BLADDER TUMOR WITH GYRUS (TURBT-GYRUS)  07/10/2009    Cystourethroscopy, gyrus TURBT (transurethral  resection of bladder tumor).[name] 03/16/2011    Current Outpatient Medications  Medication Sig Dispense Refill   cefUROXime (CEFTIN) 500 MG tablet Take 1 tablet (500 mg total) by mouth 2 (two) times daily with a meal. 14 tablet 0   Cholecalciferol (VITAMIN D3) 2000 units capsule Take 2,000 Units by mouth 2 (two) times daily.     diclofenac Sodium (VOLTAREN) 1 % GEL Apply 4 g topically 4 (four) times daily. 100 g 0   diltiazem (CARDIZEM) 30 MG tablet Take 1 tablet every 8 hours as needed 90 tablet 3   dofetilide (TIKOSYN) 250 MCG capsule Take 1 capsule by mouth twice daily 180 capsule 3   escitalopram (LEXAPRO) 10 MG tablet Take 10 mg by mouth at bedtime.     furosemide (LASIX) 20 MG tablet Take 2 tablets (40 mg total) by mouth daily. 180 tablet 2   irbesartan (AVAPRO) 150 MG tablet Take 1 tablet (150 mg total) by mouth daily. 90 tablet 3   metoprolol succinate (TOPROL-XL) 100 MG 24 hr tablet Take 1 tablet (100 mg total) by mouth 2 (two) times daily. 60 tablet 3   Multiple Vitamins-Minerals (CENTRUM SILVER PO) Take 1 tablet by mouth daily.     mupirocin ointment (BACTROBAN) 2 % Apply topically daily as needed.     OXYGEN Inhale 2 L into the  lungs at bedtime. Uses with CPAP machine     triamcinolone ointment (KENALOG) 0.1 % Apply 1 application  topically as needed.     vitamin B-12 (CYANOCOBALAMIN) 500 MCG tablet Take 500 mcg by mouth 2 (two) times daily.     warfarin (COUMADIN) 5 MG tablet TAKE 1/2 TO 1 (ONE-HALF TO ONE) TABLET BY MOUTH ONCE DAILY AS DIRECTED BY  COUMADIN  CLINIC (Patient taking differently: TAKE 1/2 TO 1 (ONE-HALF TO ONE) TABLET BY MOUTH ONCE DAILY  AS  DIRECTED  BY  COUMADIN  CLINIC) 90 tablet 0   No current facility-administered medications for this encounter.    Allergies  Allergen Reactions   Doxycycline Hives   Amiodarone     Caused neuropathy   Carbapenems    Cephalexin Other (See Comments)   Cephalosporins Other (See Comments)   Chlorpheniramine Other (See Comments)    Swells prostate Swells prostate   Ciprofloxacin     Pt can not take with amiodarone    Decongestant [Oxymetazoline]     All decongestant - makes prostate swell   Diltiazem Swelling    Left leg swelling   Flagyl [Metronidazole] Nausea Only    Stomach ache   Hctz [Hydrochlorothiazide]    Levaquin [Levofloxacin In D5w] Other (See Comments)    unknown   Levofloxacin Nausea Only   Lidocaine     Pt unsure of   Penicillins Swelling    Has patient had a PCN reaction causing immediate rash, facial/tongue/throat swelling, SOB or lightheadedness with hypotension: No Has patient had a PCN reaction causing severe rash involving mucus membranes or skin necrosis: No Has patient had a PCN reaction that required hospitalization: No Has patient had a PCN reaction occurring within the last 10 years: No If all of the above answers are "NO", then may proceed with Cephalosporin use. Swelling at injection site   Teline [Tetracycline] Swelling    Hospitalization  for 2 weeks, prostate swelling   Tetanus Toxoid Swelling    Swells arms, Please pre-medicate with benadryl.   Z-Pak [Azithromycin] Other (See Comments)    Pt can not take with  Amiodarone    Sulfonamide Derivatives Rash   Toprol Xl [Metoprolol Tartrate] Itching and Rash    Pt states he tolerates.    Social History   Socioeconomic History   Marital status: Widowed    Spouse name: [name]   Number of children: 2   Years of education: 16   Highest education level: Not on file  Occupational History    Employer: RETIRED  Tobacco Use   Smoking status: Former    Packs/day: 0.50    Years: 20.00    Additional pack years: 0.00    Total pack years: 10.00    Types: Cigarettes    Quit date: 06/03/1971    Years since quitting: 51.6    Passive exposure: Past   Smokeless tobacco: Never   Tobacco comments:    Former smoker 08/16/22  Vaping Use   Vaping Use: Never used  Substance and Sexual Activity   Alcohol use: No   Drug use: No   Sexual activity: Not on file  Other Topics Concern   Not on file  Social History Narrative   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   Patient has two children.   Patient is retired.   Patient has a college education.   Patient is right-handed.   Patient does not drink any caffeine.   Social Determinants of Health   Financial Resource Strain: Not on file  Food Insecurity: Not on file  Transportation Needs: Not on file  Physical Activity: Not on file  Stress: Not on file  Social Connections: Not on file  Intimate Partner Violence: Not on file    Family History  Problem Relation Age of Onset   Heart failure Father    Hypertension Father    Dementia Mother     ROS- All systems are reviewed and negative except as per the HPI above  Physical Exam: Vitals:   01/30/23 1409  Height: 5\' 9"  (1.753 m)    GEN- The patient is well appearing, alert and oriented x 3 today.   Head- normocephalic, atraumatic Eyes-  Sclera clear, conjunctiva pink Ears- hearing intact Oropharynx- clear Neck- supple, no JVP Lymph- no cervical lymphadenopathy Lungs- Clear to ausculation bilaterally, normal work of  breathing Heart- regular rate and rhythm, no murmurs, rubs or gallops, PMI not laterally displaced GI- soft, NT, ND, + BS Extremities- no clubbing, cyanosis, or edema MS- no significant deformity or atrophy Skin- no rash or lesion Psych- euthymic mood, full affect Neuro- strength and sensation are intact  EKG- Vent. rate 63 BPM PR interval 318 ms QRS duration 132 ms QT/QTcB 436/446 ms P-R-T axes * 88 157 Atrial-paced rhythm with prolonged AV conduction Right bundle branch block T wave abnormality, consider inferolateral ischemia Abnormal ECG When compared with ECG of 04-Dec-2022 00:01, PREVIOUS ECG IS PRESENT  Echo-  1. Technically challenging study. Recommend use of echo contrast for  future studies.   2. Grossly normal LVEF, reduced sensitivity for focal wall motion  abnormalities given technical limitations.. Left ventricular ejection  fraction, by estimation, is 55 to 60%. The left ventricle has normal  function. The left ventricle has no regional  wall motion abnormalities. Left ventricular diastolic parameters are  indeterminate.   3. Right ventricular systolic function was not well visualized. The  right  ventricular size is not well visualized. Tricuspid regurgitation signal is  inadequate for assessing PA pressure.   4. The mitral valve was not well visualized. Trivial mitral valve  regurgitation. No evidence of mitral stenosis. Moderate mitral annular  calcification.   5. The aortic valve was not well visualized. Aortic valve regurgitation  is not visualized.   Assessment and Plan: 1. Afib Maintaining SR on dofetilide   No change in treatment of afib Continue dofetilide 250 mcg bid  and metoprolol ER 75 mg bid  Bmet/mag today   2. Htn  Stable  3. CHA2DS2VASc score of 4 Continue  warfarin, restarted early March after fall with SDH in 11/2022   F/u with Dr. Percival Spanish May 17,2024 and Dr. Caryl Comes 11/2023 for Tikosyn surveillance  Afib clinic as needed     Butch Penny  C. Jazzlin Clements, [facility] [address] [place], [place] [address] [phone]

## 2023-01-31 ENCOUNTER — Encounter: Payer: Self-pay | Admitting: Emergency Medicine

## 2023-01-31 ENCOUNTER — Ambulatory Visit: Payer: Medicare Other | Admitting: Emergency Medicine

## 2023-01-31 VITALS — BP 128/76 | HR 64 | Temp 98.5°F | Ht 69.0 in | Wt 242.8 lb

## 2023-01-31 DIAGNOSIS — G4733 Obstructive sleep apnea (adult) (pediatric): Secondary | ICD-10-CM | POA: Diagnosis not present

## 2023-01-31 DIAGNOSIS — J849 Interstitial pulmonary disease, unspecified: Secondary | ICD-10-CM | POA: Diagnosis not present

## 2023-01-31 DIAGNOSIS — J9611 Chronic respiratory failure with hypoxia: Secondary | ICD-10-CM

## 2023-01-31 NOTE — Patient Instructions (Addendum)
Continue to use your CPAP every night with oxygen added You should use your oxygen reliably when you exert yourself.  We will repeat your CT chest in September  2025.  Follow with Dr. Lamonte Sakai in 12 months or sooner if you have any problems.

## 2023-01-31 NOTE — Assessment & Plan Note (Signed)
Encouraged better compliance with his exertional oxygen.  Only uses intermittently.

## 2023-01-31 NOTE — Progress Notes (Signed)
Subjective:    Patient ID: Chad [name], male    DOB: 1929/04/04, 89 y.o.   MRN: [id number]  HPI  ROV 07/12/22 --Chad [name] is 89 with a history of former tobacco, bladder cancer, hypertension with diastolic dysfunction, OSA on CPAP + 2L/min O2, A-fib with a pacemaker (former Amio).  I saw him in July for slow progressive interstitial disease with associated hypoxemia of unclear etiology.  Requalified him for exertional oxygen and he reports that he has obtained a POC - hasn't really worn it yet. He does note exertional SOB and has seen exertional desats.   CT scan of the chest 06/13/2022 reviewed by me shows subpleural and basilar predominant fibrotic interstitial disease and a UIP pattern, possibly with some slight progression compared with 09/22/2020.  ROV 01/31/23 --89 year old gentleman with a history of former tobacco use, bladder cancer, hypertension and atrial fibrillation (paced, former Amio) with diastolic dysfunction, OSA on CPAP plus oxygen.  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  Last imaging was a CT chest abdomen pelvis done 08/08/2022 after a fall with stable changes compared with priors.  There probably has been some slight progression going back to November 2021.  He subsequently had an SDH associated with a fall, off anticoagulation for period of time, now back on.  Managed on dofetilide for his A-fib Today he reports that he has been doing well.  Able to ambulate w a walker. Not really using O2 reliably, more reactive if he gets SOB.     Review of Systems As per Hpi  Past Medical History:  Diagnosis Date   Bladder cancer ([place]) 2010   Chronic bronchitis ([place])    Hypertension    Hypoglycemia    On home oxygen therapy    "2L w/CPAP at night" (06/21/2017)   OSA on CPAP    "w/2L O2 at night" (06/21/2017)   Persistent atrial fibrillation ([place])        Pneumonia    "when he was young; again in  2016" (06/21/2017)   Presence of permanent cardiac pacemaker    Sensory ataxia      Family History  Problem Relation Age of Onset   Heart failure Father    Hypertension Father    Dementia Mother      Social History   Socioeconomic History   Marital status: Widowed    Spouse name: [name]   Number of children: 2   Years of education: 16   Highest education level: Not on file  Occupational History    Employer: RETIRED  Tobacco Use   Smoking status: Former    Packs/day: 0.50    Years: 20.00    Additional pack years: 0.00    Total pack years: 10.00    Types: Cigarettes    Quit date: 06/03/1971    Years since quitting: 51.6    Passive exposure: Past   Smokeless tobacco: Never   Tobacco comments:    Former smoker 08/16/22  Vaping Use   Vaping Use: Never used  Substance and Sexual Activity   Alcohol use: No   Drug use: No   Sexual activity: Not on file  Other Topics Concern   Not on file  Social History Narrative   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   Patient has two children.   Patient is retired.   Patient has a college education.   Patient is  right-handed.   Patient does not drink any caffeine.   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 [place]  Allergies  Allergen Reactions   Doxycycline Hives   Amiodarone     Caused neuropathy   Carbapenems    Cephalexin Other (See Comments)   Cephalosporins Other (See Comments)   Chlorpheniramine Other (See Comments)    Swells prostate Swells prostate   Ciprofloxacin     Pt can not take with amiodarone    Decongestant [Oxymetazoline]      All decongestant - makes prostate swell   Diltiazem Swelling    Left leg swelling   Flagyl [Metronidazole] Nausea Only    Stomach ache   Hctz [Hydrochlorothiazide]    Levaquin [Levofloxacin In D5w] Other (See Comments)    unknown   Levofloxacin Nausea Only   Lidocaine     Pt unsure of   Penicillins Swelling    Has patient had a PCN reaction causing immediate rash, facial/tongue/throat swelling, SOB or lightheadedness with hypotension: No Has patient had a PCN reaction causing severe rash involving mucus membranes or skin necrosis: No Has patient had a PCN reaction that required hospitalization: No Has patient had a PCN reaction occurring within the last 10 years: No If all of the above answers are "NO", then may proceed with Cephalosporin use. Swelling at injection site   Teline [Tetracycline] Swelling    Hospitalization for 2 weeks, prostate swelling   Tetanus Toxoid Swelling    Swells arms, Please pre-medicate with benadryl.   Z-Pak [Azithromycin] Other (See Comments)    Pt can not take with Amiodarone    Sulfonamide Derivatives Rash   Toprol Xl [Metoprolol Tartrate] Itching and Rash    Pt states he tolerates.     Outpatient Medications Prior to Visit  Medication Sig Dispense Refill   Cholecalciferol (VITAMIN D3) 2000 units capsule Take 2,000 Units by mouth 2 (two) times daily.     diclofenac Sodium (VOLTAREN) 1 % GEL Apply 4 g topically 4 (four) times daily. 100 g 0   diltiazem (CARDIZEM) 30 MG tablet Take 1 tablet every 8 hours as needed 90 tablet 3   dofetilide (TIKOSYN) 250 MCG capsule Take 1 capsule by mouth twice daily 180 capsule 3   escitalopram (LEXAPRO) 10 MG tablet Take 10 mg by mouth at bedtime.     furosemide (LASIX) 20 MG tablet Take 2 tablets (40 mg total) by mouth daily. 180 tablet 2   irbesartan (AVAPRO) 150 MG tablet Take 0.5 tablets (75 mg total) by mouth at bedtime.     metoprolol succinate (TOPROL-XL) 100 MG 24 hr tablet Take 1 tablet (100 mg total) by  mouth 2 (two) times daily. 60 tablet 3   Multiple Vitamins-Minerals (CENTRUM SILVER PO) Take 1 tablet by mouth daily.     OXYGEN Inhale 2 L into the lungs at bedtime. Uses with CPAP machine     triamcinolone ointment (KENALOG) 0.1 % Apply 1 application  topically as needed.     vitamin B-12 (CYANOCOBALAMIN) 500 MCG tablet Take 500 mcg by mouth 2 (two) times daily.     warfarin (  COUMADIN) 5 MG tablet TAKE 1/2 TO 1 (ONE-HALF TO ONE) TABLET BY MOUTH ONCE DAILY AS DIRECTED BY  COUMADIN  CLINIC (Patient taking differently: TAKE 1/2 TO 1 (ONE-HALF TO ONE) TABLET BY MOUTH ONCE DAILY  AS  DIRECTED  BY  COUMADIN  CLINIC) 90 tablet 0   No facility-administered medications prior to visit.         Objective:   Physical Exam Vitals:   01/31/23 1057  BP: 128/76  Pulse: 64  Temp: 98.5 F (36.9 C)  TempSrc: Oral  SpO2: 96%  Weight: 242 lb 12.8 oz (110.1 kg)  Height: 5\' 9"  (1.753 m)    Gen: Pleasant, elderly man, well-nourished, in no distress,  normal affect  ENT: No lesions,  mouth clear,  oropharynx clear, no postnasal drip  Neck: No JVD, no stridor  Lungs: No use of accessory muscles, few bibasilar inspiratory crackles, bilateral inspiratory squeaks.  No wheezing  Cardiovascular: RRR, heart sounds normal, no murmur or gallops, no peripheral edema  Musculoskeletal: No deformities, no cyanosis or clubbing  Neuro: alert, awake, non focal  Skin: Warm, no lesions or rash     Assessment & Plan:  Interstitial lung disease (HCC) Interstitial lung disease, slow progression dating back to 2021 but overall stable on most recent imaging.  We discussed antifibrotic's before we have decided to defer given his age and the potential comorbidities associated with these.  He does have exertional hypoxemia, moderately compliant with his exertional oxygen. Plan to repeat Ct chest 07/2024 or sooner if a clinical change.    OSA on CPAP Reports good compliance and good clinical benefit.  Chronic  respiratory failure with hypoxia (HCC) Encouraged better compliance with his exertional oxygen.  Only uses intermittently.   Baltazar Apo, MD, PhD 01/31/2023, 5:07 PM [facility] [phone] or if no answer before 7:00PM call [phone] For any issues after 7:00PM please call eLink [phone]

## 2023-01-31 NOTE — Assessment & Plan Note (Signed)
Reports good compliance and good clinical benefit.

## 2023-01-31 NOTE — Assessment & Plan Note (Addendum)
Interstitial lung disease, slow progression dating back to 2021 but overall stable on most recent imaging.  We discussed antifibrotic's before we have decided to defer given his age and the potential comorbidities associated with these.  He does have exertional hypoxemia, moderately compliant with his exertional oxygen. Plan to repeat Ct chest 07/2024 or sooner if a clinical change.

## 2023-02-02 DIAGNOSIS — G4733 Obstructive sleep apnea (adult) (pediatric): Secondary | ICD-10-CM | POA: Diagnosis not present

## 2023-02-02 DIAGNOSIS — I4891 Unspecified atrial fibrillation: Secondary | ICD-10-CM | POA: Diagnosis not present

## 2023-02-09 ENCOUNTER — Ambulatory Visit: Payer: Medicare Other | Admitting: Podiatry

## 2023-02-09 DIAGNOSIS — M79675 Pain in left toe(s): Secondary | ICD-10-CM | POA: Diagnosis not present

## 2023-02-09 DIAGNOSIS — M79674 Pain in right toe(s): Secondary | ICD-10-CM | POA: Diagnosis not present

## 2023-02-09 DIAGNOSIS — Z7901 Long term (current) use of anticoagulants: Secondary | ICD-10-CM

## 2023-02-09 DIAGNOSIS — B351 Tinea unguium: Secondary | ICD-10-CM | POA: Diagnosis not present

## 2023-02-12 NOTE — Progress Notes (Signed)
Subjective: Chief Complaint  Patient presents with   ROUTINE FOOT CARE     89 y.o. returns the office today for painful, elongated, thickened toenails which he cannot trim himself.  No swelling redness or injury to the toenail sites.  No other concerns.   He is on Coumadin  PCP: [name], MD  Objective: AAO 3, NAD DP/PT pulses palpable, CRT less than 3 seconds Nails hypertrophic, dystrophic, elongated, brittle, discolored 10.  Subungual debris present.  Incurvation present the hallux toenail left side worse than right.  No edema, erythema or signs of infection of the toenail sites. No open lesions or pre-ulcerative lesions are identified. No pain with calf compression, swelling, warmth, erythema.  Assessment: Patient presents with symptomatic onychomycosis; chronic anticoagulation  Plan: -Treatment options including alternatives, risks, complications were discussed -Nails sharply debrided 10 without complication/bleeding.   -Monitoring signs or symptoms of infection given the ingrown toenails -Discussed daily foot inspection. If there are any changes, to call the office immediately.  -Follow-up in 3 months or sooner if any problems are to arise. In the meantime, encouraged to call the office with any questions, concerns, changes symptoms.  Celesta Gentile, DPM

## 2023-02-15 ENCOUNTER — Ambulatory Visit: Payer: Medicare Other | Attending: Cardiology | Admitting: *Deleted

## 2023-02-15 DIAGNOSIS — Z5181 Encounter for therapeutic drug level monitoring: Secondary | ICD-10-CM

## 2023-02-15 DIAGNOSIS — I4819 Other persistent atrial fibrillation: Secondary | ICD-10-CM

## 2023-02-15 LAB — POCT INR: INR: 1.5 — AB (ref 2.0–3.0)

## 2023-02-15 NOTE — Patient Instructions (Signed)
Increase warfarin to 1/2 tablet daily except 1 tablet on Wednesdays Recheck in 2 weeks

## 2023-02-20 ENCOUNTER — Ambulatory Visit (INDEPENDENT_AMBULATORY_CARE_PROVIDER_SITE_OTHER): Payer: Medicare Other

## 2023-02-20 DIAGNOSIS — I4819 Other persistent atrial fibrillation: Secondary | ICD-10-CM | POA: Diagnosis not present

## 2023-02-20 LAB — CUP PACEART REMOTE DEVICE CHECK
Battery Remaining Longevity: 53 mo
Battery Remaining Percentage: 47 %
Battery Voltage: 2.98 V
Brady Statistic AP VP Percent: 3.9 %
Brady Statistic AP VS Percent: 77 %
Brady Statistic AS VP Percent: 1 %
Brady Statistic AS VS Percent: 18 %
Brady Statistic RA Percent Paced: 77 %
Brady Statistic RV Percent Paced: 4.3 %
Date Time Interrogation Session: 20240408055247
Implantable Lead Connection Status: 753985
Implantable Lead Connection Status: 753985
Implantable Lead Implant Date: 20180808
Implantable Lead Implant Date: 20180808
Implantable Lead Location: 753859
Implantable Lead Location: 753860
Implantable Lead Model: 5076
Implantable Lead Model: 5076
Implantable Pulse Generator Implant Date: 20180808
Lead Channel Impedance Value: 430 Ohm
Lead Channel Impedance Value: 480 Ohm
Lead Channel Pacing Threshold Amplitude: 0.75 V
Lead Channel Pacing Threshold Amplitude: 0.75 V
Lead Channel Pacing Threshold Pulse Width: 0.5 ms
Lead Channel Pacing Threshold Pulse Width: 0.5 ms
Lead Channel Sensing Intrinsic Amplitude: 12 mV
Lead Channel Sensing Intrinsic Amplitude: 3.2 mV
Lead Channel Setting Pacing Amplitude: 2 V
Lead Channel Setting Pacing Amplitude: 2.5 V
Lead Channel Setting Pacing Pulse Width: 0.5 ms
Lead Channel Setting Sensing Sensitivity: 2 mV
Pulse Gen Model: 2272
Pulse Gen Serial Number: 8932564

## 2023-02-27 ENCOUNTER — Other Ambulatory Visit (HOSPITAL_COMMUNITY): Payer: Self-pay | Admitting: Physician Assistant

## 2023-02-28 ENCOUNTER — Ambulatory Visit: Payer: Medicare Other | Attending: Cardiology | Admitting: *Deleted

## 2023-02-28 DIAGNOSIS — I4819 Other persistent atrial fibrillation: Secondary | ICD-10-CM

## 2023-02-28 DIAGNOSIS — Z5181 Encounter for therapeutic drug level monitoring: Secondary | ICD-10-CM

## 2023-02-28 LAB — POCT INR: INR: 3 (ref 2.0–3.0)

## 2023-02-28 NOTE — Patient Instructions (Signed)
Continue warfarin to 1/2 tablet daily except 1 tablet on Wednesdays Recheck in 4 weeks

## 2023-03-02 DIAGNOSIS — Z1331 Encounter for screening for depression: Secondary | ICD-10-CM | POA: Diagnosis not present

## 2023-03-02 DIAGNOSIS — G894 Chronic pain syndrome: Secondary | ICD-10-CM | POA: Diagnosis not present

## 2023-03-02 DIAGNOSIS — Z6835 Body mass index (BMI) 35.0-35.9, adult: Secondary | ICD-10-CM | POA: Diagnosis not present

## 2023-03-02 DIAGNOSIS — Z9981 Dependence on supplemental oxygen: Secondary | ICD-10-CM | POA: Diagnosis not present

## 2023-03-02 DIAGNOSIS — I10 Essential (primary) hypertension: Secondary | ICD-10-CM | POA: Diagnosis not present

## 2023-03-02 DIAGNOSIS — I4892 Unspecified atrial flutter: Secondary | ICD-10-CM | POA: Diagnosis not present

## 2023-03-02 DIAGNOSIS — Z0000 Encounter for general adult medical examination without abnormal findings: Secondary | ICD-10-CM | POA: Diagnosis not present

## 2023-03-02 DIAGNOSIS — E1129 Type 2 diabetes mellitus with other diabetic kidney complication: Secondary | ICD-10-CM | POA: Diagnosis not present

## 2023-03-02 DIAGNOSIS — I5030 Unspecified diastolic (congestive) heart failure: Secondary | ICD-10-CM | POA: Diagnosis not present

## 2023-03-02 DIAGNOSIS — J84112 Idiopathic pulmonary fibrosis: Secondary | ICD-10-CM | POA: Diagnosis not present

## 2023-03-02 DIAGNOSIS — N1832 Chronic kidney disease, stage 3b: Secondary | ICD-10-CM | POA: Diagnosis not present

## 2023-03-05 DIAGNOSIS — I4891 Unspecified atrial fibrillation: Secondary | ICD-10-CM | POA: Diagnosis not present

## 2023-03-05 DIAGNOSIS — G4733 Obstructive sleep apnea (adult) (pediatric): Secondary | ICD-10-CM | POA: Diagnosis not present

## 2023-03-27 NOTE — Progress Notes (Signed)
Remote pacemaker transmission.   

## 2023-03-28 ENCOUNTER — Ambulatory Visit: Payer: Medicare Other | Attending: Cardiology | Admitting: *Deleted

## 2023-03-28 DIAGNOSIS — Z5181 Encounter for therapeutic drug level monitoring: Secondary | ICD-10-CM

## 2023-03-28 DIAGNOSIS — I4819 Other persistent atrial fibrillation: Secondary | ICD-10-CM

## 2023-03-28 LAB — POCT INR: INR: 3.1 — AB (ref 2.0–3.0)

## 2023-03-28 NOTE — Patient Instructions (Signed)
Hold warfarin tonight then resume 1/2 tablet daily except 1 tablet on Wednesdays Recheck in 4 weeks

## 2023-03-29 DIAGNOSIS — I482 Chronic atrial fibrillation, unspecified: Secondary | ICD-10-CM | POA: Insufficient documentation

## 2023-03-29 NOTE — Progress Notes (Signed)
  Cardiology Office Note:   Date:  03/31/2023  ID:  Chad [name], DOB Feb 05, 1929, MRN [id number]  History of Present Illness:   Chad [name] is a 89 y.o. male who presents for follow up of atrial fib.   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  He has been treated for SVT as well by Dr. Graciela Husbands.    He is followed in the Atrial Fib clinic.  He is being treated with Tikosyn.   He has had subdural hematoma from a fall earlier in 2024.  He has done well since I last saw him.  I did look at the last head CT and this seems to be improved with smaller hematomas.  He has not had any new cardiovascular symptoms.  He walks slowly with a walker and he will get short of breath if he does that.  If he hurries he will get dyspneic and he does have a portable oxygen concentrator.  He denies any chest pressure, neck or arm discomfort.  He has not had any new palpitations, presyncope or syncope.  He does not think he had any atrial fibrillation.  He has a little ankle edema.  ROS: As stated in the HPI and negative for all other systems.  Studies Reviewed:    EKG:  NA   Risk Assessment/Calculations:    CHA2DS2-VASc Score = 4   This indicates a 4.8% annual risk of stroke. The patient's score is based upon: CHF History: 1 (HFpEF) HTN History: 1 Diabetes History: 0 Stroke History: 0 Vascular Disease History: 0 Age Score: 2 Gender Score: 0     Physical Exam:   VS:  BP 130/72 (BP Location: Left Arm, Patient Position: Sitting, Cuff Size: Large)   Pulse 62   Ht 5\' 9"  (1.753 m)   Wt 242 lb 9.6 oz (110 kg)   SpO2 92%   BMI 35.83 kg/m    Wt Readings from Last 3 Encounters:  03/31/23 242 lb 9.6 oz (110 kg)  01/31/23 242 lb 12.8 oz (110.1 kg)  01/30/23 241 lb 6.4 oz (109.5 kg)     GEN: Well nourished, well developed in no acute distress NECK: No JVD; No carotid bruits CARDIAC: RRR, no murmurs, rubs, gallops RESPIRATORY:  Clear to auscultation without rales, wheezing or rhonchi   ABDOMEN: Soft, non-tender, non-distended EXTREMITIES: Mild ankle edema; No deformity   ASSESSMENT AND PLAN:   SOB:   This is unchanged from previous or may be a little bit better.  No change in therapy.  ATRIAL FIB:  Chad [name] has a CHA2DS2 - VASc score of he remains on anticoagulation.   He is back on Coumadin and tolerates this.  He had less than 1% noted on his monitor.  HTN: Blood pressure is at target.  No change in therapy.     HFpEF:   Again he seems to be euvolemic.  No change in therapy.    VT: He is at recurrence of this.  No change in therapy.     SLEEP APNEA: He uses his CPAP.       Signed, Rollene Rotunda, MD

## 2023-03-31 ENCOUNTER — Encounter: Payer: Self-pay | Admitting: Cardiology

## 2023-03-31 ENCOUNTER — Ambulatory Visit: Payer: Medicare Other | Attending: Cardiology | Admitting: Cardiology

## 2023-03-31 VITALS — BP 130/72 | HR 62 | Ht 69.0 in | Wt 242.6 lb

## 2023-03-31 DIAGNOSIS — I4729 Other ventricular tachycardia: Secondary | ICD-10-CM | POA: Diagnosis not present

## 2023-03-31 DIAGNOSIS — I10 Essential (primary) hypertension: Secondary | ICD-10-CM

## 2023-03-31 DIAGNOSIS — I4819 Other persistent atrial fibrillation: Secondary | ICD-10-CM

## 2023-03-31 DIAGNOSIS — G4730 Sleep apnea, unspecified: Secondary | ICD-10-CM

## 2023-03-31 DIAGNOSIS — R0602 Shortness of breath: Secondary | ICD-10-CM

## 2023-03-31 DIAGNOSIS — I482 Chronic atrial fibrillation, unspecified: Secondary | ICD-10-CM

## 2023-03-31 NOTE — Patient Instructions (Signed)
  Follow-Up: At [facility], you and your health needs are our priority.  As part of our continuing mission to provide you with exceptional heart care, we have created designated Provider Care Teams.  These Care Teams include your primary Cardiologist (physician) and Advanced Practice Providers (APPs -  Physician Assistants and Nurse Practitioners) who all work together to provide you with the care you need, when you need it.  We recommend signing up for the patient portal called "MyChart".  Sign up information is provided on this After Visit Summary.  MyChart is used to connect with patients for Virtual Visits (Telemedicine).  Patients are able to view lab/test results, encounter notes, upcoming appointments, etc.  Non-urgent messages can be sent to your provider as well.   To learn more about what you can do with MyChart, go to [email or web address].    Your next appointment:   6 month(s)  Provider:   James Hochrein, MD      

## 2023-04-04 DIAGNOSIS — G4733 Obstructive sleep apnea (adult) (pediatric): Secondary | ICD-10-CM | POA: Diagnosis not present

## 2023-04-04 DIAGNOSIS — I4891 Unspecified atrial fibrillation: Secondary | ICD-10-CM | POA: Diagnosis not present

## 2023-04-06 DIAGNOSIS — N1832 Chronic kidney disease, stage 3b: Secondary | ICD-10-CM | POA: Diagnosis not present

## 2023-04-06 DIAGNOSIS — R5383 Other fatigue: Secondary | ICD-10-CM | POA: Diagnosis not present

## 2023-04-06 DIAGNOSIS — Z0000 Encounter for general adult medical examination without abnormal findings: Secondary | ICD-10-CM | POA: Diagnosis not present

## 2023-04-14 LAB — LAB REPORT - SCANNED: EGFR: 48

## 2023-04-25 ENCOUNTER — Ambulatory Visit: Payer: Medicare Other | Attending: Cardiology | Admitting: *Deleted

## 2023-04-25 DIAGNOSIS — Z5181 Encounter for therapeutic drug level monitoring: Secondary | ICD-10-CM

## 2023-04-25 DIAGNOSIS — I4819 Other persistent atrial fibrillation: Secondary | ICD-10-CM | POA: Diagnosis not present

## 2023-04-25 LAB — POCT INR: INR: 3.8 — AB (ref 2.0–3.0)

## 2023-04-25 NOTE — Patient Instructions (Signed)
Hold warfarin tonight then decrease dose to 1/2 tablet daily  Recheck in 3 weeks 

## 2023-05-05 DIAGNOSIS — G4733 Obstructive sleep apnea (adult) (pediatric): Secondary | ICD-10-CM | POA: Diagnosis not present

## 2023-05-05 DIAGNOSIS — I4891 Unspecified atrial fibrillation: Secondary | ICD-10-CM | POA: Diagnosis not present

## 2023-05-08 ENCOUNTER — Ambulatory Visit: Payer: Medicare Other | Admitting: Podiatry

## 2023-05-08 ENCOUNTER — Encounter: Payer: Self-pay | Admitting: Podiatry

## 2023-05-08 DIAGNOSIS — M79674 Pain in right toe(s): Secondary | ICD-10-CM

## 2023-05-08 DIAGNOSIS — Z7901 Long term (current) use of anticoagulants: Secondary | ICD-10-CM

## 2023-05-08 DIAGNOSIS — M79675 Pain in left toe(s): Secondary | ICD-10-CM | POA: Diagnosis not present

## 2023-05-08 DIAGNOSIS — B351 Tinea unguium: Secondary | ICD-10-CM | POA: Diagnosis not present

## 2023-05-08 NOTE — Progress Notes (Signed)
This patient returns to my office for at risk foot care.  This patient requires this care by a professional since this patient will be at risk due to having chronic antocoagulation due to coumadin.  This patient is unable to cut nails himself since the patient cannot reach his nails.These nails are painful walking and wearing shoes.  This patient presents for at risk foot care today.  General Appearance  Alert, conversant and in no acute stress.  Vascular  Dorsalis pedis and posterior tibial  pulses are  weakly palpable  bilaterally.  Capillary return is within normal limits  bilaterally. Temperature is within normal limits  bilaterally.  Neurologic  Senn-Weinstein monofilament wire test within normal limits  bilaterally. Muscle power within normal limits bilaterally.  Nails Thick disfigured discolored nails with subungual debris  from hallux to fifth toes bilaterally. No evidence of bacterial infection or drainage bilaterally.  Orthopedic  No limitations of motion  feet .  No crepitus or effusions noted.  No bony pathology or digital deformities noted.  Skin  normotropic skin with no porokeratosis noted bilaterally.  No signs of infections or ulcers noted.     Onychomycosis  Pain in right toes  Pain in left toes  Consent was obtained for treatment procedures.   Mechanical debridement of nails 1-5  bilaterally performed with a nail nipper.  Filed with dremel without incident.    Return office visit   3 months                   Told patient to return for periodic foot care and evaluation due to potential at risk complications.   Helane Gunther DPM

## 2023-05-12 ENCOUNTER — Ambulatory Visit: Payer: Medicare Other | Admitting: Podiatry

## 2023-05-14 ENCOUNTER — Other Ambulatory Visit: Payer: Self-pay | Admitting: Cardiology

## 2023-05-14 DIAGNOSIS — I4819 Other persistent atrial fibrillation: Secondary | ICD-10-CM

## 2023-05-16 ENCOUNTER — Ambulatory Visit: Payer: Medicare Other | Attending: Cardiology | Admitting: *Deleted

## 2023-05-16 DIAGNOSIS — Z5181 Encounter for therapeutic drug level monitoring: Secondary | ICD-10-CM

## 2023-05-16 DIAGNOSIS — I4819 Other persistent atrial fibrillation: Secondary | ICD-10-CM

## 2023-05-16 LAB — POCT INR: INR: 2.5 (ref 2.0–3.0)

## 2023-05-16 NOTE — Patient Instructions (Signed)
Continue warfarin 1/2 tablet daily Recheck in 4 weeks 

## 2023-05-22 ENCOUNTER — Ambulatory Visit (INDEPENDENT_AMBULATORY_CARE_PROVIDER_SITE_OTHER): Payer: Medicare Other

## 2023-05-22 DIAGNOSIS — I495 Sick sinus syndrome: Secondary | ICD-10-CM

## 2023-05-22 LAB — CUP PACEART REMOTE DEVICE CHECK
Battery Remaining Longevity: 50 mo
Battery Remaining Percentage: 45 %
Battery Voltage: 2.98 V
Brady Statistic AP VP Percent: 6.9 %
Brady Statistic AP VS Percent: 76 %
Brady Statistic AS VP Percent: 1 %
Brady Statistic AS VS Percent: 17 %
Brady Statistic RA Percent Paced: 79 %
Brady Statistic RV Percent Paced: 7.4 %
Date Time Interrogation Session: 20240708083518
Implantable Lead Connection Status: 753985
Implantable Lead Connection Status: 753985
Implantable Lead Implant Date: 20180808
Implantable Lead Implant Date: 20180808
Implantable Lead Location: 753859
Implantable Lead Location: 753860
Implantable Lead Model: 5076
Implantable Lead Model: 5076
Implantable Pulse Generator Implant Date: 20180808
Lead Channel Impedance Value: 450 Ohm
Lead Channel Impedance Value: 460 Ohm
Lead Channel Pacing Threshold Amplitude: 0.75 V
Lead Channel Pacing Threshold Amplitude: 0.75 V
Lead Channel Pacing Threshold Pulse Width: 0.5 ms
Lead Channel Pacing Threshold Pulse Width: 0.5 ms
Lead Channel Sensing Intrinsic Amplitude: 12 mV
Lead Channel Sensing Intrinsic Amplitude: 3 mV
Lead Channel Setting Pacing Amplitude: 2 V
Lead Channel Setting Pacing Amplitude: 2.5 V
Lead Channel Setting Pacing Pulse Width: 0.5 ms
Lead Channel Setting Sensing Sensitivity: 2 mV
Pulse Gen Model: 2272
Pulse Gen Serial Number: 8932564

## 2023-06-01 DIAGNOSIS — C672 Malignant neoplasm of lateral wall of bladder: Secondary | ICD-10-CM | POA: Diagnosis not present

## 2023-06-04 DIAGNOSIS — G4733 Obstructive sleep apnea (adult) (pediatric): Secondary | ICD-10-CM | POA: Diagnosis not present

## 2023-06-04 DIAGNOSIS — I4891 Unspecified atrial fibrillation: Secondary | ICD-10-CM | POA: Diagnosis not present

## 2023-06-06 NOTE — Progress Notes (Signed)
Remote pacemaker transmission.   

## 2023-06-15 ENCOUNTER — Ambulatory Visit: Payer: Medicare Other | Attending: Cardiology | Admitting: *Deleted

## 2023-06-15 DIAGNOSIS — I4819 Other persistent atrial fibrillation: Secondary | ICD-10-CM

## 2023-06-15 DIAGNOSIS — Z5181 Encounter for therapeutic drug level monitoring: Secondary | ICD-10-CM

## 2023-06-15 LAB — POCT INR: INR: 2.3 (ref 2.0–3.0)

## 2023-06-15 NOTE — Patient Instructions (Signed)
Continue warfarin 1/2 tablet daily  Recheck in 6 weeks  

## 2023-07-05 DIAGNOSIS — I4891 Unspecified atrial fibrillation: Secondary | ICD-10-CM | POA: Diagnosis not present

## 2023-07-05 DIAGNOSIS — G4733 Obstructive sleep apnea (adult) (pediatric): Secondary | ICD-10-CM | POA: Diagnosis not present

## 2023-07-13 ENCOUNTER — Other Ambulatory Visit: Payer: Self-pay | Admitting: Cardiology

## 2023-07-13 DIAGNOSIS — I480 Paroxysmal atrial fibrillation: Secondary | ICD-10-CM

## 2023-07-16 ENCOUNTER — Other Ambulatory Visit: Payer: Self-pay | Admitting: Cardiology

## 2023-07-19 ENCOUNTER — Ambulatory Visit: Payer: Medicare Other | Admitting: Podiatry

## 2023-07-19 ENCOUNTER — Other Ambulatory Visit: Payer: Self-pay | Admitting: Cardiology

## 2023-07-19 ENCOUNTER — Encounter: Payer: Self-pay | Admitting: Podiatry

## 2023-07-19 DIAGNOSIS — M79675 Pain in left toe(s): Secondary | ICD-10-CM

## 2023-07-19 DIAGNOSIS — Z7901 Long term (current) use of anticoagulants: Secondary | ICD-10-CM

## 2023-07-19 DIAGNOSIS — B351 Tinea unguium: Secondary | ICD-10-CM | POA: Diagnosis not present

## 2023-07-19 DIAGNOSIS — M79674 Pain in right toe(s): Secondary | ICD-10-CM | POA: Diagnosis not present

## 2023-07-19 NOTE — Progress Notes (Signed)
This patient returns to my office for at risk foot care.  This patient requires this care by a professional since this patient will be at risk due to having chronic antocoagulation due to coumadin.  This patient is unable to cut nails himself since the patient cannot reach his nails.These nails are painful walking and wearing shoes.  This patient presents for at risk foot care today.  General Appearance  Alert, conversant and in no acute stress.  Vascular  Dorsalis pedis and posterior tibial  pulses are  weakly palpable  bilaterally.  Capillary return is within normal limits  bilaterally. Temperature is within normal limits  bilaterally.  Neurologic  Senn-Weinstein monofilament wire test within normal limits  bilaterally. Muscle power within normal limits bilaterally.  Nails Thick disfigured discolored nails with subungual debris  from hallux to fifth toes bilaterally. No evidence of bacterial infection or drainage bilaterally.  Orthopedic  No limitations of motion  feet .  No crepitus or effusions noted.  No bony pathology or digital deformities noted.  Skin  normotropic skin with no porokeratosis noted bilaterally.  No signs of infections or ulcers noted.     Onychomycosis  Pain in right toes  Pain in left toes  Consent was obtained for treatment procedures.   Mechanical debridement of nails 1-5  bilaterally performed with a nail nipper.  Filed with dremel without incident.    Return office visit   3 months                   Told patient to return for periodic foot care and evaluation due to potential at risk complications.   Helane Gunther DPM

## 2023-07-22 ENCOUNTER — Inpatient Hospital Stay (HOSPITAL_COMMUNITY)
Admission: EM | Admit: 2023-07-22 | Discharge: 2023-07-25 | DRG: 291 | Disposition: A | Discharge status: Home Health | Payer: Medicare Other | Attending: Internal Medicine | Admitting: Internal Medicine

## 2023-07-22 ENCOUNTER — Emergency Department (HOSPITAL_COMMUNITY): Payer: Medicare Other

## 2023-07-22 ENCOUNTER — Encounter (HOSPITAL_COMMUNITY): Payer: Self-pay | Admitting: Emergency Medicine

## 2023-07-22 ENCOUNTER — Other Ambulatory Visit: Payer: Self-pay

## 2023-07-22 DIAGNOSIS — I5021 Acute systolic (congestive) heart failure: Secondary | ICD-10-CM | POA: Diagnosis not present

## 2023-07-22 DIAGNOSIS — I2510 Atherosclerotic heart disease of native coronary artery without angina pectoris: Secondary | ICD-10-CM | POA: Diagnosis present

## 2023-07-22 DIAGNOSIS — I4819 Other persistent atrial fibrillation: Secondary | ICD-10-CM | POA: Diagnosis present

## 2023-07-22 DIAGNOSIS — I5033 Acute on chronic diastolic (congestive) heart failure: Secondary | ICD-10-CM | POA: Diagnosis present

## 2023-07-22 DIAGNOSIS — Z950 Presence of cardiac pacemaker: Secondary | ICD-10-CM

## 2023-07-22 DIAGNOSIS — Z888 Allergy status to other drugs, medicaments and biological substances status: Secondary | ICD-10-CM

## 2023-07-22 DIAGNOSIS — M549 Dorsalgia, unspecified: Secondary | ICD-10-CM | POA: Diagnosis not present

## 2023-07-22 DIAGNOSIS — Z818 Family history of other mental and behavioral disorders: Secondary | ICD-10-CM

## 2023-07-22 DIAGNOSIS — R2242 Localized swelling, mass and lump, left lower limb: Secondary | ICD-10-CM | POA: Diagnosis present

## 2023-07-22 DIAGNOSIS — I480 Paroxysmal atrial fibrillation: Secondary | ICD-10-CM | POA: Diagnosis not present

## 2023-07-22 DIAGNOSIS — Z7901 Long term (current) use of anticoagulants: Secondary | ICD-10-CM

## 2023-07-22 DIAGNOSIS — J849 Interstitial pulmonary disease, unspecified: Secondary | ICD-10-CM | POA: Diagnosis not present

## 2023-07-22 DIAGNOSIS — I130 Hypertensive heart and chronic kidney disease with heart failure and stage 1 through stage 4 chronic kidney disease, or unspecified chronic kidney disease: Principal | ICD-10-CM | POA: Diagnosis present

## 2023-07-22 DIAGNOSIS — Z880 Allergy status to penicillin: Secondary | ICD-10-CM | POA: Diagnosis not present

## 2023-07-22 DIAGNOSIS — J90 Pleural effusion, not elsewhere classified: Secondary | ICD-10-CM | POA: Diagnosis not present

## 2023-07-22 DIAGNOSIS — R0600 Dyspnea, unspecified: Principal | ICD-10-CM

## 2023-07-22 DIAGNOSIS — J84112 Idiopathic pulmonary fibrosis: Principal | ICD-10-CM | POA: Diagnosis present

## 2023-07-22 DIAGNOSIS — I5043 Acute on chronic combined systolic (congestive) and diastolic (congestive) heart failure: Secondary | ICD-10-CM | POA: Diagnosis not present

## 2023-07-22 DIAGNOSIS — I472 Ventricular tachycardia, unspecified: Secondary | ICD-10-CM | POA: Diagnosis present

## 2023-07-22 DIAGNOSIS — Z8551 Personal history of malignant neoplasm of bladder: Secondary | ICD-10-CM | POA: Diagnosis not present

## 2023-07-22 DIAGNOSIS — I3139 Other pericardial effusion (noninflammatory): Secondary | ICD-10-CM | POA: Diagnosis present

## 2023-07-22 DIAGNOSIS — Z887 Allergy status to serum and vaccine status: Secondary | ICD-10-CM | POA: Diagnosis not present

## 2023-07-22 DIAGNOSIS — N1831 Chronic kidney disease, stage 3a: Secondary | ICD-10-CM | POA: Diagnosis not present

## 2023-07-22 DIAGNOSIS — R0902 Hypoxemia: Secondary | ICD-10-CM

## 2023-07-22 DIAGNOSIS — Z8249 Family history of ischemic heart disease and other diseases of the circulatory system: Secondary | ICD-10-CM

## 2023-07-22 DIAGNOSIS — R0602 Shortness of breath: Secondary | ICD-10-CM | POA: Diagnosis not present

## 2023-07-22 DIAGNOSIS — J9611 Chronic respiratory failure with hypoxia: Secondary | ICD-10-CM | POA: Diagnosis not present

## 2023-07-22 DIAGNOSIS — Z87891 Personal history of nicotine dependence: Secondary | ICD-10-CM

## 2023-07-22 DIAGNOSIS — Z882 Allergy status to sulfonamides status: Secondary | ICD-10-CM | POA: Diagnosis not present

## 2023-07-22 DIAGNOSIS — Z79899 Other long term (current) drug therapy: Secondary | ICD-10-CM

## 2023-07-22 DIAGNOSIS — J439 Emphysema, unspecified: Secondary | ICD-10-CM | POA: Diagnosis not present

## 2023-07-22 DIAGNOSIS — G4734 Idiopathic sleep related nonobstructive alveolar hypoventilation: Secondary | ICD-10-CM | POA: Diagnosis not present

## 2023-07-22 DIAGNOSIS — G4733 Obstructive sleep apnea (adult) (pediatric): Secondary | ICD-10-CM | POA: Diagnosis not present

## 2023-07-22 DIAGNOSIS — D72829 Elevated white blood cell count, unspecified: Secondary | ICD-10-CM | POA: Diagnosis present

## 2023-07-22 DIAGNOSIS — Z7189 Other specified counseling: Secondary | ICD-10-CM

## 2023-07-22 DIAGNOSIS — M7989 Other specified soft tissue disorders: Secondary | ICD-10-CM | POA: Diagnosis not present

## 2023-07-22 DIAGNOSIS — R062 Wheezing: Secondary | ICD-10-CM

## 2023-07-22 DIAGNOSIS — L723 Sebaceous cyst: Secondary | ICD-10-CM | POA: Diagnosis not present

## 2023-07-22 DIAGNOSIS — N179 Acute kidney failure, unspecified: Secondary | ICD-10-CM | POA: Diagnosis present

## 2023-07-22 DIAGNOSIS — I10 Essential (primary) hypertension: Secondary | ICD-10-CM | POA: Diagnosis present

## 2023-07-22 DIAGNOSIS — Z881 Allergy status to other antibiotic agents status: Secondary | ICD-10-CM | POA: Diagnosis not present

## 2023-07-22 LAB — BASIC METABOLIC PANEL
Anion gap: 7 (ref 5–15)
BUN: 17 mg/dL (ref 8–23)
CO2: 31 mmol/L (ref 22–32)
Calcium: 8.8 mg/dL — ABNORMAL LOW (ref 8.9–10.3)
Chloride: 100 mmol/L (ref 98–111)
Creatinine, Ser: 1.46 mg/dL — ABNORMAL HIGH (ref 0.61–1.24)
GFR, Estimated: 45 mL/min — ABNORMAL LOW (ref >60)
Glucose, Bld: 119 mg/dL — ABNORMAL HIGH (ref 70–99)
Potassium: 4 mmol/L (ref 3.5–5.1)
Sodium: 138 mmol/L (ref 135–145)

## 2023-07-22 LAB — CBC
HCT: 48.3 % (ref 39.0–52.0)
Hemoglobin: 15.2 g/dL (ref 13.0–17.0)
MCH: 29.1 pg (ref 26.0–34.0)
MCHC: 31.5 g/dL (ref 30.0–36.0)
MCV: 92.4 fL (ref 80.0–100.0)
Platelets: 177 10*3/uL (ref 150–400)
RBC: 5.23 MIL/uL (ref 4.22–5.81)
RDW: 14.3 % (ref 11.5–15.5)
WBC: 7.3 10*3/uL (ref 4.0–10.5)
nRBC: 0 % (ref 0.0–0.2)

## 2023-07-22 LAB — PROTIME-INR
INR: 2.1 — ABNORMAL HIGH (ref 0.8–1.2)
Prothrombin Time: 23.6 seconds — ABNORMAL HIGH (ref 11.4–15.2)

## 2023-07-22 LAB — BRAIN NATRIURETIC PEPTIDE: B Natriuretic Peptide: 214.6 pg/mL — ABNORMAL HIGH (ref 0.0–100.0)

## 2023-07-22 NOTE — ED Triage Notes (Signed)
Pt reports he has been shob all day today.  Took his furosemide at home and then another dose several hours later.  Reports he was weighing himself and his weight went down about 7 lb however he still was having a hard time breathing.  Pt reports he feels his ankles are swollen. Did try his prn home O2 without relief.  No CP

## 2023-07-23 ENCOUNTER — Emergency Department (HOSPITAL_COMMUNITY): Payer: Medicare Other

## 2023-07-23 ENCOUNTER — Inpatient Hospital Stay (HOSPITAL_COMMUNITY): Payer: Medicare Other

## 2023-07-23 DIAGNOSIS — Z8249 Family history of ischemic heart disease and other diseases of the circulatory system: Secondary | ICD-10-CM | POA: Diagnosis not present

## 2023-07-23 DIAGNOSIS — I4819 Other persistent atrial fibrillation: Secondary | ICD-10-CM | POA: Diagnosis present

## 2023-07-23 DIAGNOSIS — Z887 Allergy status to serum and vaccine status: Secondary | ICD-10-CM | POA: Diagnosis not present

## 2023-07-23 DIAGNOSIS — I130 Hypertensive heart and chronic kidney disease with heart failure and stage 1 through stage 4 chronic kidney disease, or unspecified chronic kidney disease: Secondary | ICD-10-CM | POA: Diagnosis present

## 2023-07-23 DIAGNOSIS — I5033 Acute on chronic diastolic (congestive) heart failure: Secondary | ICD-10-CM | POA: Diagnosis present

## 2023-07-23 DIAGNOSIS — J849 Interstitial pulmonary disease, unspecified: Secondary | ICD-10-CM | POA: Diagnosis not present

## 2023-07-23 DIAGNOSIS — Z881 Allergy status to other antibiotic agents status: Secondary | ICD-10-CM | POA: Diagnosis not present

## 2023-07-23 DIAGNOSIS — I5043 Acute on chronic combined systolic (congestive) and diastolic (congestive) heart failure: Secondary | ICD-10-CM | POA: Diagnosis not present

## 2023-07-23 DIAGNOSIS — Z950 Presence of cardiac pacemaker: Secondary | ICD-10-CM | POA: Diagnosis not present

## 2023-07-23 DIAGNOSIS — J9611 Chronic respiratory failure with hypoxia: Secondary | ICD-10-CM | POA: Diagnosis present

## 2023-07-23 DIAGNOSIS — Z888 Allergy status to other drugs, medicaments and biological substances status: Secondary | ICD-10-CM | POA: Diagnosis not present

## 2023-07-23 DIAGNOSIS — J84112 Idiopathic pulmonary fibrosis: Secondary | ICD-10-CM | POA: Diagnosis present

## 2023-07-23 DIAGNOSIS — N1831 Chronic kidney disease, stage 3a: Secondary | ICD-10-CM | POA: Diagnosis present

## 2023-07-23 DIAGNOSIS — M7989 Other specified soft tissue disorders: Secondary | ICD-10-CM

## 2023-07-23 DIAGNOSIS — Z880 Allergy status to penicillin: Secondary | ICD-10-CM | POA: Diagnosis not present

## 2023-07-23 DIAGNOSIS — Z8551 Personal history of malignant neoplasm of bladder: Secondary | ICD-10-CM | POA: Diagnosis not present

## 2023-07-23 DIAGNOSIS — R2242 Localized swelling, mass and lump, left lower limb: Secondary | ICD-10-CM | POA: Diagnosis present

## 2023-07-23 DIAGNOSIS — Z882 Allergy status to sulfonamides status: Secondary | ICD-10-CM | POA: Diagnosis not present

## 2023-07-23 DIAGNOSIS — D72829 Elevated white blood cell count, unspecified: Secondary | ICD-10-CM | POA: Diagnosis present

## 2023-07-23 DIAGNOSIS — N179 Acute kidney failure, unspecified: Secondary | ICD-10-CM | POA: Diagnosis present

## 2023-07-23 DIAGNOSIS — J439 Emphysema, unspecified: Secondary | ICD-10-CM | POA: Diagnosis present

## 2023-07-23 DIAGNOSIS — G4734 Idiopathic sleep related nonobstructive alveolar hypoventilation: Secondary | ICD-10-CM | POA: Diagnosis not present

## 2023-07-23 DIAGNOSIS — Z7901 Long term (current) use of anticoagulants: Secondary | ICD-10-CM | POA: Diagnosis not present

## 2023-07-23 DIAGNOSIS — I3139 Other pericardial effusion (noninflammatory): Secondary | ICD-10-CM | POA: Diagnosis present

## 2023-07-23 DIAGNOSIS — I2510 Atherosclerotic heart disease of native coronary artery without angina pectoris: Secondary | ICD-10-CM | POA: Diagnosis present

## 2023-07-23 DIAGNOSIS — I5021 Acute systolic (congestive) heart failure: Secondary | ICD-10-CM | POA: Diagnosis not present

## 2023-07-23 DIAGNOSIS — I472 Ventricular tachycardia, unspecified: Secondary | ICD-10-CM | POA: Diagnosis present

## 2023-07-23 DIAGNOSIS — Z87891 Personal history of nicotine dependence: Secondary | ICD-10-CM | POA: Diagnosis not present

## 2023-07-23 DIAGNOSIS — G4733 Obstructive sleep apnea (adult) (pediatric): Secondary | ICD-10-CM | POA: Diagnosis present

## 2023-07-23 DIAGNOSIS — Z818 Family history of other mental and behavioral disorders: Secondary | ICD-10-CM | POA: Diagnosis not present

## 2023-07-23 DIAGNOSIS — I480 Paroxysmal atrial fibrillation: Secondary | ICD-10-CM | POA: Diagnosis not present

## 2023-07-23 LAB — BASIC METABOLIC PANEL
Anion gap: 16 — ABNORMAL HIGH (ref 5–15)
BUN: 15 mg/dL (ref 8–23)
CO2: 26 mmol/L (ref 22–32)
Calcium: 9.1 mg/dL (ref 8.9–10.3)
Chloride: 96 mmol/L — ABNORMAL LOW (ref 98–111)
Creatinine, Ser: 1.3 mg/dL — ABNORMAL HIGH (ref 0.61–1.24)
GFR, Estimated: 52 mL/min — ABNORMAL LOW (ref >60)
Glucose, Bld: 239 mg/dL — ABNORMAL HIGH (ref 70–99)
Potassium: 3.8 mmol/L (ref 3.5–5.1)
Sodium: 138 mmol/L (ref 135–145)

## 2023-07-23 LAB — RESPIRATORY PANEL BY PCR

## 2023-07-23 LAB — I-STAT ARTERIAL BLOOD GAS, ED
Acid-Base Excess: 4 mmol/L — ABNORMAL HIGH (ref 0.0–2.0)
Bicarbonate: 30.8 mmol/L — ABNORMAL HIGH (ref 20.0–28.0)
Calcium, Ion: 1.25 mmol/L (ref 1.15–1.40)
HCT: 46 % (ref 39.0–52.0)
Hemoglobin: 15.6 g/dL (ref 13.0–17.0)
O2 Saturation: 90 %
Patient temperature: 36.5
Potassium: 3.6 mmol/L (ref 3.5–5.1)
Sodium: 139 mmol/L (ref 135–145)
TCO2: 32 mmol/L (ref 22–32)
pCO2 arterial: 53.7 mmHg — ABNORMAL HIGH (ref 32–48)
pH, Arterial: 7.365 (ref 7.35–7.45)
pO2, Arterial: 59 mmHg — ABNORMAL LOW (ref 83–108)

## 2023-07-23 LAB — MAGNESIUM: Magnesium: 2 mg/dL (ref 1.7–2.4)

## 2023-07-23 LAB — TROPONIN I (HIGH SENSITIVITY)
Troponin I (High Sensitivity): 9 ng/L (ref <18)
Troponin I (High Sensitivity): 13 ng/L (ref <18)

## 2023-07-23 MED ORDER — DILTIAZEM HCL 30 MG PO TABS
30.0000 mg | ORAL_TABLET | Freq: Three times a day (TID) | ORAL | Status: DC
  Administered 2023-07-23 – 2023-07-24 (×3): 30 mg via ORAL
  Filled 2023-07-23 (×4): qty 1

## 2023-07-23 MED ORDER — IRBESARTAN 150 MG PO TABS
75.0000 mg | ORAL_TABLET | Freq: Every day | ORAL | Status: DC
  Administered 2023-07-23 – 2023-07-24 (×2): 75 mg via ORAL
  Filled 2023-07-23 (×2): qty 1

## 2023-07-23 MED ORDER — WARFARIN - PHYSICIAN DOSING INPATIENT: Freq: Every day | Status: DC

## 2023-07-23 MED ORDER — POLYETHYLENE GLYCOL 3350 17 G PO PACK: 17.0000 g | PACK | Freq: Every day | ORAL | Status: DC | PRN

## 2023-07-23 MED ORDER — POTASSIUM CHLORIDE CRYS ER 20 MEQ PO TBCR
20.0000 meq | EXTENDED_RELEASE_TABLET | Freq: Once | ORAL | Status: AC
  Administered 2023-07-23: 20 meq via ORAL
  Filled 2023-07-23: qty 1

## 2023-07-23 MED ORDER — FUROSEMIDE 10 MG/ML IJ SOLN
40.0000 mg | Freq: Two times a day (BID) | INTRAMUSCULAR | Status: DC
  Administered 2023-07-23 – 2023-07-25 (×5): 40 mg via INTRAVENOUS
  Filled 2023-07-23 (×5): qty 4

## 2023-07-23 MED ORDER — POTASSIUM CHLORIDE CRYS ER 20 MEQ PO TBCR
20.0000 meq | EXTENDED_RELEASE_TABLET | Freq: Two times a day (BID) | ORAL | Status: DC
  Administered 2023-07-23 – 2023-07-25 (×5): 20 meq via ORAL
  Filled 2023-07-23 (×5): qty 1

## 2023-07-23 MED ORDER — GUAIFENESIN ER 600 MG PO TB12: 600.0000 mg | ORAL_TABLET | Freq: Two times a day (BID) | ORAL | Status: DC | PRN

## 2023-07-23 MED ORDER — WARFARIN SODIUM 2.5 MG PO TABS: 2.5000 mg | ORAL_TABLET | Freq: Every day | ORAL | Status: DC

## 2023-07-23 MED ORDER — MAGNESIUM SULFATE 2 GM/50ML IV SOLN
2.0000 g | Freq: Once | INTRAVENOUS | Status: AC
  Administered 2023-07-23: 2 g via INTRAVENOUS
  Filled 2023-07-23: qty 50

## 2023-07-23 MED ORDER — ACETAMINOPHEN 650 MG RE SUPP: 650.0000 mg | Freq: Four times a day (QID) | RECTAL | Status: DC | PRN

## 2023-07-23 MED ORDER — SODIUM CHLORIDE 0.9% FLUSH: 3.0000 mL | INTRAVENOUS | Status: DC | PRN

## 2023-07-23 MED ORDER — DILTIAZEM HCL 30 MG PO TABS
30.0000 mg | ORAL_TABLET | Freq: Three times a day (TID) | ORAL | Status: DC
  Filled 2023-07-23 (×2): qty 1

## 2023-07-23 MED ORDER — ALBUTEROL SULFATE (2.5 MG/3ML) 0.083% IN NEBU: 2.5000 mg | INHALATION_SOLUTION | RESPIRATORY_TRACT | Status: DC | PRN

## 2023-07-23 MED ORDER — VITAMIN D 25 MCG (1000 UNIT) PO TABS
2000.0000 [IU] | ORAL_TABLET | Freq: Two times a day (BID) | ORAL | Status: DC
  Administered 2023-07-23 – 2023-07-25 (×5): 2000 [IU] via ORAL
  Filled 2023-07-23 (×5): qty 2

## 2023-07-23 MED ORDER — VITAMIN B-12 1000 MCG PO TABS
500.0000 ug | ORAL_TABLET | Freq: Two times a day (BID) | ORAL | Status: DC
  Administered 2023-07-23 – 2023-07-25 (×5): 500 ug via ORAL
  Filled 2023-07-23: qty 1
  Filled 2023-07-23: qty 5
  Filled 2023-07-23 (×2): qty 1
  Filled 2023-07-23 (×2): qty 5

## 2023-07-23 MED ORDER — METOPROLOL SUCCINATE ER 100 MG PO TB24
100.0000 mg | ORAL_TABLET | Freq: Two times a day (BID) | ORAL | Status: DC
  Administered 2023-07-23 – 2023-07-25 (×5): 100 mg via ORAL
  Filled 2023-07-23 (×4): qty 1
  Filled 2023-07-23: qty 4

## 2023-07-23 MED ORDER — WARFARIN SODIUM 2.5 MG PO TABS
2.5000 mg | ORAL_TABLET | Freq: Every day | ORAL | Status: AC
  Administered 2023-07-23: 2.5 mg via ORAL
  Filled 2023-07-23: qty 1

## 2023-07-23 MED ORDER — ONDANSETRON HCL 4 MG PO TABS: 4.0000 mg | ORAL_TABLET | Freq: Four times a day (QID) | ORAL | Status: DC | PRN

## 2023-07-23 MED ORDER — METHYLPREDNISOLONE SODIUM SUCC 125 MG IJ SOLR
125.0000 mg | INTRAMUSCULAR | Status: AC
  Administered 2023-07-23: 125 mg via INTRAVENOUS
  Filled 2023-07-23: qty 2

## 2023-07-23 MED ORDER — DOFETILIDE 250 MCG PO CAPS
250.0000 ug | ORAL_CAPSULE | Freq: Two times a day (BID) | ORAL | Status: DC
  Administered 2023-07-23 – 2023-07-25 (×4): 250 ug via ORAL
  Filled 2023-07-23 (×6): qty 1

## 2023-07-23 MED ORDER — ALBUTEROL SULFATE (2.5 MG/3ML) 0.083% IN NEBU
5.0000 mg | INHALATION_SOLUTION | Freq: Once | RESPIRATORY_TRACT | Status: AC
  Administered 2023-07-23: 5 mg via RESPIRATORY_TRACT
  Filled 2023-07-23: qty 6

## 2023-07-23 MED ORDER — ACETAMINOPHEN 325 MG PO TABS
650.0000 mg | ORAL_TABLET | Freq: Four times a day (QID) | ORAL | Status: DC | PRN
  Administered 2023-07-23 (×2): 650 mg via ORAL
  Filled 2023-07-23 (×2): qty 2

## 2023-07-23 MED ORDER — WARFARIN - PHARMACIST DOSING INPATIENT: Freq: Every day | Status: DC

## 2023-07-23 MED ORDER — SODIUM CHLORIDE 0.9% FLUSH
3.0000 mL | Freq: Two times a day (BID) | INTRAVENOUS | Status: DC
  Administered 2023-07-23 – 2023-07-25 (×4): 3 mL via INTRAVENOUS

## 2023-07-23 MED ORDER — IOHEXOL 350 MG/ML SOLN
75.0000 mL | Freq: Once | INTRAVENOUS | Status: AC | PRN
  Administered 2023-07-23: 75 mL via INTRAVENOUS

## 2023-07-23 MED ORDER — IPRATROPIUM-ALBUTEROL 0.5-2.5 (3) MG/3ML IN SOLN
3.0000 mL | Freq: Once | RESPIRATORY_TRACT | Status: AC
  Administered 2023-07-23: 3 mL via RESPIRATORY_TRACT
  Filled 2023-07-23: qty 3

## 2023-07-23 MED ORDER — ONDANSETRON HCL 4 MG/2ML IJ SOLN: 4.0000 mg | Freq: Four times a day (QID) | INTRAMUSCULAR | Status: DC | PRN

## 2023-07-23 MED ORDER — ADULT MULTIVITAMIN W/MINERALS CH
1.0000 | ORAL_TABLET | Freq: Every day | ORAL | Status: DC
  Administered 2023-07-23 – 2023-07-25 (×3): 1 via ORAL
  Filled 2023-07-23 (×3): qty 1

## 2023-07-23 MED ORDER — FENTANYL CITRATE PF 50 MCG/ML IJ SOSY: 12.5000 ug | PREFILLED_SYRINGE | INTRAMUSCULAR | Status: DC | PRN

## 2023-07-23 MED ORDER — DOFETILIDE 250 MCG PO CAPS: 250.0000 ug | ORAL_CAPSULE | Freq: Two times a day (BID) | ORAL | Status: DC

## 2023-07-23 MED ORDER — ESCITALOPRAM OXALATE 10 MG PO TABS
10.0000 mg | ORAL_TABLET | Freq: Every day | ORAL | Status: DC
  Administered 2023-07-23 – 2023-07-24 (×2): 10 mg via ORAL
  Filled 2023-07-23 (×2): qty 1

## 2023-07-23 MED ORDER — SODIUM CHLORIDE 0.9 % IV SOLN: 250.0000 mL | INTRAVENOUS | Status: DC | PRN

## 2023-07-23 NOTE — ED Provider Notes (Signed)
[facility] EMERGENCY DEPARTMENT AT [facility] Provider Note   CSN: [id number] Arrival date & time: 07/22/23  2054     History  Chief Complaint  Patient presents with   Shortness of Breath    Chad [name] is a 89 y.o. male.  The history is provided by the patient.  Shortness of Breath Severity:  Severe Onset quality:  Gradual Duration:  1 week Timing:  Constant Progression:  Worsening Chronicity:  New Context: not URI   Relieved by:  Nothing Worsened by:  Nothing Ineffective treatments:  None tried Associated symptoms: no abdominal pain   Risk factors: no recent alcohol use   Patient with pulmonary fibrosis presents with SOB and new      Home Medications Prior to Admission medications   Medication Sig Start Date End Date Taking? Authorizing Provider  Cholecalciferol (VITAMIN D3) 2000 units capsule Take 2,000 Units by mouth 2 (two) times daily.    [provider]  diclofenac Sodium (VOLTAREN) 1 % GEL Apply 4 g topically 4 (four) times daily. Patient not taking: Reported on 03/31/2023 08/08/22   Melene Plan, DO  diltiazem (CARDIZEM) 30 MG tablet TAKE 1 TABLET BY MOUTH EVERY 8 HOURS AS NEEDED 07/13/23   Rollene Rotunda, MD  dofetilide [facility]) 250 MCG capsule Take 1 capsule by mouth twice daily 07/18/23   Rollene Rotunda, MD  escitalopram (LEXAPRO) 10 MG tablet Take 10 mg by mouth at bedtime.    [provider]  furosemide (LASIX) 20 MG tablet Take 1 tablet by mouth twice daily 07/19/23   Rollene Rotunda, MD  irbesartan (AVAPRO) 150 MG tablet Take 0.5 tablets (75 mg total) by mouth at bedtime. 01/30/23   Newman Nip, NP  metoprolol succinate (TOPROL-XL) 100 MG 24 hr tablet Take 1 tablet by mouth twice daily 02/28/23   Fenton, Clint R, PA  Multiple Vitamins-Minerals (CENTRUM SILVER PO) Take 1 tablet by mouth daily.    [provider]  OXYGEN Inhale 2 L into the lungs at bedtime. Uses with CPAP machine    [provider]   triamcinolone ointment (KENALOG) 0.1 % Apply 1 application  topically as needed. 06/08/21   [provider]  vitamin B-12 (CYANOCOBALAMIN) 500 MCG tablet Take 500 mcg by mouth 2 (two) times daily.    [provider]  warfarin (COUMADIN) 5 MG tablet TAKE 1/2 TO 1 (ONE-HALF TO ONE) TABLET BY MOUTH ONCE DAILY AS DIRECTED BY  COUMADIN  CLINIC 05/15/23   Rollene Rotunda, MD      Allergies    Doxycycline, Amiodarone, Carbapenems, Cephalexin, Cephalosporins, Chlorpheniramine, Ciprofloxacin, Decongestant [oxymetazoline], Diltiazem, Flagyl [metronidazole], Hctz [hydrochlorothiazide], Levaquin [levofloxacin in d5w], Levofloxacin, Lidocaine, Penicillins, Teline [tetracycline], Tetanus toxoid, Z-pak [azithromycin], Sulfonamide derivatives, and Toprol xl [metoprolol tartrate]    Review of Systems   Review of Systems  Respiratory:  Positive for shortness of breath.   Gastrointestinal:  Negative for abdominal pain.  All other systems reviewed and are negative.   Physical Exam Updated Vital Signs BP (!) 140/79   Pulse 61   Temp 97.7 F (36.5 C) (Oral)   Resp 18   SpO2 97%  Physical Exam Vitals and nursing note reviewed.  Constitutional:      General: He is not in acute distress.    Appearance: Normal appearance. He is well-developed. He is not diaphoretic.  HENT:     Head: Normocephalic and atraumatic.     Nose: Nose normal.  Eyes:     Conjunctiva/sclera: Conjunctivae normal.  Pupils: Pupils are equal, round, and reactive to light.  Cardiovascular:     Rate and Rhythm: Normal rate and regular rhythm.     Pulses: Normal pulses.     Heart sounds: Normal heart sounds.  Pulmonary:     Effort: Pulmonary effort is normal.     Breath sounds: Wheezing present. No rales.  Abdominal:     General: Bowel sounds are normal.     Palpations: Abdomen is soft.     Tenderness: There is no abdominal tenderness. There is no guarding or rebound.  Musculoskeletal:        General: Normal  range of motion.     Cervical back: Normal range of motion and neck supple.  Skin:    General: Skin is warm and dry.  Neurological:     Mental Status: He is alert and oriented to person, place, and time.     ED Results / Procedures / Treatments   Labs (all labs ordered are listed, but only abnormal results are displayed) Results for orders placed or performed during the hospital encounter of 07/22/23  Basic metabolic panel  Result Value Ref Range   Sodium 138 135 - 145 mmol/L   Potassium 4.0 3.5 - 5.1 mmol/L   Chloride 100 98 - 111 mmol/L   CO2 31 22 - 32 mmol/L   Glucose, Bld 119 (H) 70 - 99 mg/dL   BUN 17 8 - 23 mg/dL   Creatinine, Ser 1.61 (H) 0.61 - 1.24 mg/dL   Calcium 8.8 (L) 8.9 - 10.3 mg/dL   GFR, Estimated 45 (L) >60 mL/min   Anion gap 7 5 - 15  CBC  Result Value Ref Range   WBC 7.3 4.0 - 10.5 K/uL   RBC 5.23 4.22 - 5.81 MIL/uL   Hemoglobin 15.2 13.0 - 17.0 g/dL   HCT 09.6 04.5 - 40.9 %   MCV 92.4 80.0 - 100.0 fL   MCH 29.1 26.0 - 34.0 pg   MCHC 31.5 30.0 - 36.0 g/dL   RDW 81.1 91.4 - 78.2 %   Platelets 177 150 - 400 K/uL   nRBC 0.0 0.0 - 0.2 %  Brain natriuretic peptide  Result Value Ref Range   B Natriuretic Peptide 214.6 (H) 0.0 - 100.0 pg/mL  Protime-INR  Result Value Ref Range   Prothrombin Time 23.6 (H) 11.4 - 15.2 seconds   INR 2.1 (H) 0.8 - 1.2  I-Stat arterial blood gas, ED ([facility] ED, [facility], [facility])  Result Value Ref Range   pH, Arterial 7.365 7.35 - 7.45   pCO2 arterial 53.7 (H) 32 - 48 mmHg   pO2, Arterial 59 (L) 83 - 108 mmHg   Bicarbonate 30.8 (H) 20.0 - 28.0 mmol/L   TCO2 32 22 - 32 mmol/L   O2 Saturation 90 %   Acid-Base Excess 4.0 (H) 0.0 - 2.0 mmol/L   Sodium 139 135 - 145 mmol/L   Potassium 3.6 3.5 - 5.1 mmol/L   Calcium, Ion 1.25 1.15 - 1.40 mmol/L   HCT 46.0 39.0 - 52.0 %   Hemoglobin 15.6 13.0 - 17.0 g/dL   Patient temperature 95.6 C    Collection site RADIAL, ALLEN'S TEST ACCEPTABLE    Drawn by RT    Sample type ARTERIAL    CT  Angio Chest PE W and/or Wo Contrast  Result Date: 07/23/2023 CLINICAL DATA:  Syncope/presyncope. Cerebrovascular cause suspected. Evaluate shortness of breath. Also states his ankles are swelling. EXAM: CT ANGIOGRAPHY CHEST WITH CONTRAST TECHNIQUE: Multidetector CT  imaging of the chest was performed using the standard protocol during bolus administration of intravenous contrast. Multiplanar CT image reconstructions and MIPs were obtained to evaluate the vascular anatomy. RADIATION DOSE REDUCTION: This exam was performed according to the departmental dose-optimization program which includes automated exposure control, adjustment of the mA and/or kV according to patient size and/or use of iterative reconstruction technique. CONTRAST:  75mL OMNIPAQUE IOHEXOL 350 MG/ML SOLN COMPARISON:  Chest, abdomen and pelvis CT with contrast 08/08/2022, chest CT without contrast 06/13/2022. FINDINGS: Cardiovascular: Interval increased prominence of the pulmonary trunk measuring 3.3 cm indicating arterial hypertension. No arterial embolism is seen. Small circumferential pericardial effusion is slightly increased in the interval. There is artifact from a left chest pacemaker and leads in the right atrium and ventricle. The heart is slightly enlarged. There are calcifications in the coronary arteries, aorta and great vessels. No aortic aneurysm is seen. Luminal opacification is insufficient to assess for dissection. The pulmonary veins are nondistended. Mediastinum/Nodes: No enlarged mediastinal, hilar, or axillary lymph nodes. Thyroid gland, trachea, and esophagus demonstrate no significant findings. Lungs/Pleura: There is diffuse bronchial thickening again noted, with subpleural reticulation and scattered honeycombing, with patchy areas of air trapping in the upper zones and chronic faint ground-glass disease in the lower zones. The findings are again greater on the right than the left without significant interval progression. There  are scattered linear scar-like opacities in the bases. There is no typical zonal predilection to this and this is not associated with central bronchiectasis. There is no active infiltrate, no nodule is seen. There are trace pleural effusions. No pneumothorax. Upper Abdomen: No acute abnormality. Musculoskeletal: Osteopenia and degenerative changes and extensive bridging enthesopathy of the thoracic spine. No acute or other significant osseous abnormality. There is a 4.2 cm sebaceous cyst left of the midline in the upper chest wall, previously 3.6 cm. No suspicious chest wall abnormality. Review of the MIP images confirms the above findings. IMPRESSION: 1. No evidence of arterial embolus. 2. Interval increased prominence of the pulmonary trunk. 3. Cardiomegaly with small circumferential pericardial effusion slightly increased in the interval. No venous dilatation or overt edema. 4. Aortic and coronary artery atherosclerosis. 5. Chronic interstitial lung disease without significant interval progression. 6. Trace pleural effusions, new in the interval. 7. 4.2 cm sebaceous cyst left of the midline in the upper chest wall, previously 3.6 cm. 8. Osteopenia and degenerative change with extensive spinal bridging enthesopathy. Electronically Signed   By: Almira Bar M.D.   On: 07/23/2023 05:16   DG Chest 2 View  Result Date: 07/22/2023 CLINICAL DATA:  COPD and shortness of breath.  Back pain. EXAM: CHEST - 2 VIEW COMPARISON:  Chest radiograph dated 12/04/2022 and CT dated 08/08/2022. FINDINGS: Background of emphysema and chronic interstitial coarsening. No focal consolidation, pleural effusion, or pneumothorax. Stable cardiac silhouette. Left pectoral pacemaker device. No acute osseous pathology. Degenerative changes of the spine. Old lower thoracic compression fracture with anterior wedging. IMPRESSION: 1. No active cardiopulmonary disease. 2. Emphysema. Electronically Signed   By: Elgie Collard M.D.   On:  07/22/2023 22:13     EKG EKG Interpretation Date/Time:  Saturday July 22 2023 21:02:56 EDT Ventricular Rate:  62 PR Interval:  280 QRS Duration:  130 QT Interval:  458 QTC Calculation: 464 R Axis:   45  Text Interpretation: Atrial-paced rhythm with prolonged AV conduction Right bundle branch block unchanged Confirmed by Nicanor Alcon, Jenice Leiner ([id number]) on 07/23/2023 12:04:37 AM  Radiology CT Angio Chest PE W and/or Wo Contrast  Result Date: 07/23/2023 CLINICAL DATA:  Syncope/presyncope. Cerebrovascular cause suspected. Evaluate shortness of breath. Also states his ankles are swelling. EXAM: CT ANGIOGRAPHY CHEST WITH CONTRAST TECHNIQUE: Multidetector CT imaging of the chest was performed using the standard protocol during bolus administration of intravenous contrast. Multiplanar CT image reconstructions and MIPs were obtained to evaluate the vascular anatomy. RADIATION DOSE REDUCTION: This exam was performed according to the departmental dose-optimization program which includes automated exposure control, adjustment of the mA and/or kV according to patient size and/or use of iterative reconstruction technique. CONTRAST:  75mL OMNIPAQUE IOHEXOL 350 MG/ML SOLN COMPARISON:  Chest, abdomen and pelvis CT with contrast 08/08/2022, chest CT without contrast 06/13/2022. FINDINGS: Cardiovascular: Interval increased prominence of the pulmonary trunk measuring 3.3 cm indicating arterial hypertension. No arterial embolism is seen. Small circumferential pericardial effusion is slightly increased in the interval. There is artifact from a left chest pacemaker and leads in the right atrium and ventricle. The heart is slightly enlarged. There are calcifications in the coronary arteries, aorta and great vessels. No aortic aneurysm is seen. Luminal opacification is insufficient to assess for dissection. The pulmonary veins are nondistended. Mediastinum/Nodes: No enlarged mediastinal, hilar, or axillary lymph nodes. Thyroid  gland, trachea, and esophagus demonstrate no significant findings. Lungs/Pleura: There is diffuse bronchial thickening again noted, with subpleural reticulation and scattered honeycombing, with patchy areas of air trapping in the upper zones and chronic faint ground-glass disease in the lower zones. The findings are again greater on the right than the left without significant interval progression. There are scattered linear scar-like opacities in the bases. There is no typical zonal predilection to this and this is not associated with central bronchiectasis. There is no active infiltrate, no nodule is seen. There are trace pleural effusions. No pneumothorax. Upper Abdomen: No acute abnormality. Musculoskeletal: Osteopenia and degenerative changes and extensive bridging enthesopathy of the thoracic spine. No acute or other significant osseous abnormality. There is a 4.2 cm sebaceous cyst left of the midline in the upper chest wall, previously 3.6 cm. No suspicious chest wall abnormality. Review of the MIP images confirms the above findings. IMPRESSION: 1. No evidence of arterial embolus. 2. Interval increased prominence of the pulmonary trunk. 3. Cardiomegaly with small circumferential pericardial effusion slightly increased in the interval. No venous dilatation or overt edema. 4. Aortic and coronary artery atherosclerosis. 5. Chronic interstitial lung disease without significant interval progression. 6. Trace pleural effusions, new in the interval. 7. 4.2 cm sebaceous cyst left of the midline in the upper chest wall, previously 3.6 cm. 8. Osteopenia and degenerative change with extensive spinal bridging enthesopathy. Electronically Signed   By: Almira Bar M.D.   On: 07/23/2023 05:16   DG Chest 2 View  Result Date: 07/22/2023 CLINICAL DATA:  COPD and shortness of breath.  Back pain. EXAM: CHEST - 2 VIEW COMPARISON:  Chest radiograph dated 12/04/2022 and CT dated 08/08/2022. FINDINGS: Background of emphysema  and chronic interstitial coarsening. No focal consolidation, pleural effusion, or pneumothorax. Stable cardiac silhouette. Left pectoral pacemaker device. No acute osseous pathology. Degenerative changes of the spine. Old lower thoracic compression fracture with anterior wedging. IMPRESSION: 1. No active cardiopulmonary disease. 2. Emphysema. Electronically Signed   By: Elgie Collard M.D.   On: 07/22/2023 22:13    Procedures Procedures    Medications Ordered in ED Medications  iohexol (OMNIPAQUE) 350 MG/ML injection 75 mL (75 mLs Intravenous Contrast Given 07/23/23 0207)  ipratropium-albuterol (DUONEB) 0.5-2.5 (3) MG/3ML nebulizer solution 3 mL (3 mLs Nebulization Given 07/23/23 0240)  methylPREDNISolone sodium succinate (SOLU-MEDROL) 125 mg/2 mL injection 125 mg (125 mg Intravenous Given 07/23/23 0550)  albuterol (PROVENTIL) (2.5 MG/3ML) 0.083% nebulizer solution 5 mg (5 mg Nebulization Given 07/23/23 0548)    ED Course/ Medical Decision Making/ A&P                                 Medical Decision Making Patient with hypoxia and and ILD, this is new in the past month worse in the past week   Amount and/or Complexity of Data Reviewed Independent Historian:     Details: Daughter see above  External Data Reviewed: notes. Labs: ordered.    Details: Elevated BNP 214. Normal sodium 138, normal potassium 4, elevated creatinine 1.46, normal white count 7.3, normal hemoglobin 15.2, normal platelets  Radiology: ordered and independent interpretation performed.    Details: No CHF by me on CTA ECG/medicine tests: ordered and independent interpretation performed. Decision-making details documented in ED Course.  Risk Prescription drug management. Decision regarding hospitalization. Risk Details: New worsening hypoxia and wheezing will treat and admit for same.      Final Clinical Impression(s) / ED Diagnoses Final diagnoses:  Dyspnea, unspecified type  Hypoxia  ILD (interstitial lung  disease) (HCC)  Wheezing   The patient appears reasonably stabilized for admission considering the current resources, flow, and capabilities available in the ED at this time, and I doubt any other [facility] requiring further screening and/or treatment in the ED prior to admission.  Rx / DC Orders ED Discharge Orders     None         Jolly Bleicher, MD 07/23/23 [phone]

## 2023-07-23 NOTE — Hospital Course (Signed)
Patient is a 89 year old with idiopathic pulmonary fibrosis, A-fib on Coumadin, HTN, HFpEF, history of V. tach, OSA on CPAP who comes in with progressive shortness of breath.  He has supplemental oxygen to use at home which she does if he gets short of breath however for the last week he has been increasingly short of breath.  He notes he had a 10 pound weight gain over the last week and he increased his Lasix and had lost 7 of those pounds.  That he has been increasingly dyspneic with any movement and so came in for eval of that.  In the ED he has noted to have some mild AKI and an elevated BNP.  He was placed on 2 L of oxygen to keep his sats greater than 95%.  In the ED he was noted to be wheezing he was given IV Solu-Medrol as well as a breathing treatment that he is not sure helped a lot.

## 2023-07-23 NOTE — H&P (Signed)
History and Physical    Patient: Chad [name] ZOX:[id number] DOB: 04/29/1929 DOA: 07/22/2023 DOS: the patient was seen and examined on 07/23/2023 PCP: [facility]  Patient coming from: Home  Chief Complaint:  Chief Complaint  Patient presents with   Shortness of Breath   HPI: Chad [name] is a 89 y.o. male with medical history significant of  idiopathic pulmonary fibrosis, A-fib on Coumadin, HTN, HFpEF, history of V. tach, OSA on CPAP who comes in with progressive shortness of breath.  He has supplemental oxygen to use at home which she does if he gets short of breath however for the last week he has been increasingly short of breath.  He notes he had a 10 pound weight gain over the last week and he increased his Lasix and had lost 7 of those pounds.  That he has been increasingly dyspneic with any movement and so came in for eval of that.  In the ED he has noted to have some mild AKI and an elevated BNP.  He was placed on 2 L of oxygen to keep his sats greater than 95%.  In the ED he was noted to be wheezing he was given IV Solu-Medrol as well as a breathing treatment that he is not sure helped a lot. Review of Systems: As mentioned in the history of present illness. All other systems reviewed and are negative. Past Medical History:  Diagnosis Date   Bladder cancer (HCC) 2010   Chronic bronchitis (HCC)    Hypertension    Hypoglycemia    On home oxygen therapy    "2L w/CPAP at night" (06/21/2017)   OSA on CPAP    "w/2L O2 at night" (06/21/2017)   Persistent atrial fibrillation (HCC)        Pneumonia    "when he was young; again in 2016" (06/21/2017)   Presence of permanent cardiac pacemaker    Sensory ataxia    Past Surgical History:  Procedure Laterality Date   BACK SURGERY     CARDIOVERSION N/A 04/23/2015   Procedure: CARDIOVERSION;  Surgeon: Pricilla Riffle, MD;  Location: [facility] ENDOSCOPY;  Service: Cardiovascular;  Laterality: N/A;   CARDIOVERSION N/A 07/30/2015    Procedure: CARDIOVERSION;  Surgeon: Jake Bathe, MD;  Location: [facility] ENDOSCOPY;  Service: Cardiovascular;  Laterality: N/A;   EYE SURGERY Bilateral 2009   "to lower pressure; no glaucoma" (06/21/2017)   INSERT / REPLACE / REMOVE PACEMAKER  06/21/2017   LUMBAR DISC SURGERY  1987   PACEMAKER IMPLANT N/A 06/21/2017   Procedure: Pacemaker Implant;  Surgeon: Duke Salvia, MD;  Location: [facility] INVASIVE CV LAB;  Service: Cardiovascular;  Laterality: N/A;   TRANSURETHRAL RESECTION OF BLADDER TUMOR WITH GYRUS (TURBT-GYRUS)  07/10/2009    Cystourethroscopy, gyrus TURBT (transurethral  resection of bladder tumor).Hattie Perch 03/16/2011   Social History:  reports that he quit smoking about 52 years ago. His smoking use included cigarettes. He started smoking about 72 years ago. He has a 10 pack-year smoking history. He has been exposed to tobacco smoke. He has never used smokeless tobacco. He reports that he does not drink alcohol and does not use drugs.  Allergies  Allergen Reactions   Doxycycline Hives   Amiodarone     Caused neuropathy   Carbapenems    Cephalexin Other (See Comments)   Cephalosporins Other (See Comments)   Chlorpheniramine Other (See Comments)    Swells prostate Swells prostate   Ciprofloxacin     Pt can not  take with amiodarone    Decongestant [Oxymetazoline]     All decongestant - makes prostate swell   Diltiazem Swelling    Left leg swelling   Flagyl [Metronidazole] Nausea Only    Stomach ache   Hctz [Hydrochlorothiazide]    Levaquin [Levofloxacin In D5w] Other (See Comments)    unknown   Levofloxacin Nausea Only   Lidocaine     Pt unsure of   Penicillins Swelling    Has patient had a PCN reaction causing immediate rash, facial/tongue/throat swelling, SOB or lightheadedness with hypotension: No Has patient had a PCN reaction causing severe rash involving mucus membranes or skin necrosis: No Has patient had a PCN reaction that required hospitalization: No Has patient had a PCN  reaction occurring within the last 10 years: No If all of the above answers are "NO", then may proceed with Cephalosporin use. Swelling at injection site   Teline [Tetracycline] Swelling    Hospitalization for 2 weeks, prostate swelling   Tetanus Toxoid Swelling    Swells arms, Please pre-medicate with benadryl.   Z-Pak [Azithromycin] Other (See Comments)    Pt can not take with Amiodarone    Sulfonamide Derivatives Rash   Toprol Xl [Metoprolol Tartrate] Itching and Rash    Pt states he tolerates.    Family History  Problem Relation Age of Onset   Heart failure Father    Hypertension Father    Dementia Mother     Prior to Admission medications   Medication Sig Start Date End Date Taking? Authorizing Provider  Cholecalciferol (VITAMIN D3) 2000 units capsule Take 2,000 Units by mouth 2 (two) times daily.    [provider]  diclofenac Sodium (VOLTAREN) 1 % GEL Apply 4 g topically 4 (four) times daily. Patient not taking: Reported on 03/31/2023 08/08/22   Melene Plan, DO  diltiazem (CARDIZEM) 30 MG tablet TAKE 1 TABLET BY MOUTH EVERY 8 HOURS AS NEEDED 07/13/23   Rollene Rotunda, MD  dofetilide [facility]) 250 MCG capsule Take 1 capsule by mouth twice daily 07/18/23   Rollene Rotunda, MD  escitalopram (LEXAPRO) 10 MG tablet Take 10 mg by mouth at bedtime.    [provider]  furosemide (LASIX) 20 MG tablet Take 1 tablet by mouth twice daily 07/19/23   Rollene Rotunda, MD  irbesartan (AVAPRO) 150 MG tablet Take 0.5 tablets (75 mg total) by mouth at bedtime. 01/30/23   Newman Nip, NP  metoprolol succinate (TOPROL-XL) 100 MG 24 hr tablet Take 1 tablet by mouth twice daily 02/28/23   Fenton, Clint R, PA  Multiple Vitamins-Minerals (CENTRUM SILVER PO) Take 1 tablet by mouth daily.    [provider]  OXYGEN Inhale 2 L into the lungs at bedtime. Uses with CPAP machine    [provider]  triamcinolone ointment (KENALOG) 0.1 % Apply 1 application  topically as  needed. 06/08/21   [provider]  vitamin B-12 (CYANOCOBALAMIN) 500 MCG tablet Take 500 mcg by mouth 2 (two) times daily.    [provider]  warfarin (COUMADIN) 5 MG tablet TAKE 1/2 TO 1 (ONE-HALF TO ONE) TABLET BY MOUTH ONCE DAILY AS DIRECTED BY  COUMADIN  CLINIC 05/15/23   Rollene Rotunda, MD    Physical Exam: Vitals:   07/23/23 0530 07/23/23 0600 07/23/23 0630 07/23/23 0700  BP: (!) 140/79 (!) 143/86 (!) 152/79 125/70  Pulse: 61 64 64 64  Resp: 18 19 18 17   Temp:    97.9 F (36.6 C)  TempSrc:  SpO2: 97% 99% 99% 95%   Physical Examination: General appearance - alert, well appearing, and in no distress Mental status - alert, oriented to person, place, and time Chest - clear to auscultation, no wheezes, rales or rhonchi, symmetric air entry Heart - normal rate, regular rhythm, normal S1, S2, no murmurs, rubs, clicks or gallops Abdomen - soft, nontender, nondistended, no masses or organomegaly Extremities -left lower extremity with 3+ edema right lower extremity with none  Data Reviewed: Results for orders placed or performed during the hospital encounter of 07/22/23 (from the past 24 hour(s))  Basic metabolic panel     Status: Abnormal   Collection Time: 07/22/23  9:23 PM  Result Value Ref Range   Sodium 138 135 - 145 mmol/L   Potassium 4.0 3.5 - 5.1 mmol/L   Chloride 100 98 - 111 mmol/L   CO2 31 22 - 32 mmol/L   Glucose, Bld 119 (H) 70 - 99 mg/dL   BUN 17 8 - 23 mg/dL   Creatinine, Ser 1.61 (H) 0.61 - 1.24 mg/dL   Calcium 8.8 (L) 8.9 - 10.3 mg/dL   GFR, Estimated 45 (L) >60 mL/min   Anion gap 7 5 - 15  CBC     Status: None   Collection Time: 07/22/23  9:23 PM  Result Value Ref Range   WBC 7.3 4.0 - 10.5 K/uL   RBC 5.23 4.22 - 5.81 MIL/uL   Hemoglobin 15.2 13.0 - 17.0 g/dL   HCT 09.6 04.5 - 40.9 %   MCV 92.4 80.0 - 100.0 fL   MCH 29.1 26.0 - 34.0 pg   MCHC 31.5 30.0 - 36.0 g/dL   RDW 81.1 91.4 - 78.2 %   Platelets 177 150 - 400 K/uL   nRBC 0.0  0.0 - 0.2 %  Brain natriuretic peptide     Status: Abnormal   Collection Time: 07/22/23  9:23 PM  Result Value Ref Range   B Natriuretic Peptide 214.6 (H) 0.0 - 100.0 pg/mL  Protime-INR     Status: Abnormal   Collection Time: 07/22/23  9:23 PM  Result Value Ref Range   Prothrombin Time 23.6 (H) 11.4 - 15.2 seconds   INR 2.1 (H) 0.8 - 1.2  I-Stat arterial blood gas, ED ([facility], [facility], [facility])     Status: Abnormal   Collection Time: 07/23/23  2:47 AM  Result Value Ref Range   pH, Arterial 7.365 7.35 - 7.45   pCO2 arterial 53.7 (H) 32 - 48 mmHg   pO2, Arterial 59 (L) 83 - 108 mmHg   Bicarbonate 30.8 (H) 20.0 - 28.0 mmol/L   TCO2 32 22 - 32 mmol/L   O2 Saturation 90 %   Acid-Base Excess 4.0 (H) 0.0 - 2.0 mmol/L   Sodium 139 135 - 145 mmol/L   Potassium 3.6 3.5 - 5.1 mmol/L   Calcium, Ion 1.25 1.15 - 1.40 mmol/L   HCT 46.0 39.0 - 52.0 %   Hemoglobin 15.6 13.0 - 17.0 g/dL   Patient temperature 95.6 C    Collection site RADIAL, ALLEN'S TEST ACCEPTABLE    Drawn by RT    Sample type ARTERIAL   Troponin I (High Sensitivity)     Status: None   Collection Time: 07/23/23  8:21 AM  Result Value Ref Range   Troponin I (High Sensitivity) 9 <18 ng/L   CT Angio Chest PE W and/or Wo Contrast  Result Date: 07/23/2023 CLINICAL DATA:  Syncope/presyncope. Cerebrovascular cause suspected. Evaluate shortness of breath. Also states  his ankles are swelling. EXAM: CT ANGIOGRAPHY CHEST WITH CONTRAST TECHNIQUE: Multidetector CT imaging of the chest was performed using the standard protocol during bolus administration of intravenous contrast. Multiplanar CT image reconstructions and MIPs were obtained to evaluate the vascular anatomy. RADIATION DOSE REDUCTION: This exam was performed according to the departmental dose-optimization program which includes automated exposure control, adjustment of the mA and/or kV according to patient size and/or use of iterative reconstruction technique. CONTRAST:  75mL OMNIPAQUE  IOHEXOL 350 MG/ML SOLN COMPARISON:  Chest, abdomen and pelvis CT with contrast 08/08/2022, chest CT without contrast 06/13/2022. FINDINGS: Cardiovascular: Interval increased prominence of the pulmonary trunk measuring 3.3 cm indicating arterial hypertension. No arterial embolism is seen. Small circumferential pericardial effusion is slightly increased in the interval. There is artifact from a left chest pacemaker and leads in the right atrium and ventricle. The heart is slightly enlarged. There are calcifications in the coronary arteries, aorta and great vessels. No aortic aneurysm is seen. Luminal opacification is insufficient to assess for dissection. The pulmonary veins are nondistended. Mediastinum/Nodes: No enlarged mediastinal, hilar, or axillary lymph nodes. Thyroid gland, trachea, and esophagus demonstrate no significant findings. Lungs/Pleura: There is diffuse bronchial thickening again noted, with subpleural reticulation and scattered honeycombing, with patchy areas of air trapping in the upper zones and chronic faint ground-glass disease in the lower zones. The findings are again greater on the right than the left without significant interval progression. There are scattered linear scar-like opacities in the bases. There is no typical zonal predilection to this and this is not associated with central bronchiectasis. There is no active infiltrate, no nodule is seen. There are trace pleural effusions. No pneumothorax. Upper Abdomen: No acute abnormality. Musculoskeletal: Osteopenia and degenerative changes and extensive bridging enthesopathy of the thoracic spine. No acute or other significant osseous abnormality. There is a 4.2 cm sebaceous cyst left of the midline in the upper chest wall, previously 3.6 cm. No suspicious chest wall abnormality. Review of the MIP images confirms the above findings. IMPRESSION: 1. No evidence of arterial embolus. 2. Interval increased prominence of the pulmonary trunk. 3.  Cardiomegaly with small circumferential pericardial effusion slightly increased in the interval. No venous dilatation or overt edema. 4. Aortic and coronary artery atherosclerosis. 5. Chronic interstitial lung disease without significant interval progression. 6. Trace pleural effusions, new in the interval. 7. 4.2 cm sebaceous cyst left of the midline in the upper chest wall, previously 3.6 cm. 8. Osteopenia and degenerative change with extensive spinal bridging enthesopathy. Electronically Signed   By: Almira Bar M.D.   On: 07/23/2023 05:16   DG Chest 2 View  Result Date: 07/22/2023 CLINICAL DATA:  COPD and shortness of breath.  Back pain. EXAM: CHEST - 2 VIEW COMPARISON:  Chest radiograph dated 12/04/2022 and CT dated 08/08/2022. FINDINGS: Background of emphysema and chronic interstitial coarsening. No focal consolidation, pleural effusion, or pneumothorax. Stable cardiac silhouette. Left pectoral pacemaker device. No acute osseous pathology. Degenerative changes of the spine. Old lower thoracic compression fracture with anterior wedging. IMPRESSION: 1. No active cardiopulmonary disease. 2. Emphysema. Electronically Signed   By: Elgie Collard M.D.   On: 07/22/2023 22:13    EKG shows atrial paced rhythm, right bundle branch block, inverted T waves throughout unchanged from previous  Assessment and Plan: * Acute exacerbation of idiopathic pulmonary fibrosis (HCC) Has pulmonary outpatient follow-up scheduled for this week  Localized swelling of left lower extremity Truly doubt DVT given that he is on anticoagulation and therapeutic however given the  discordancy and exam we will just rule this out  OSA on CPAP Compliant with this CPAP at night ordered  Acute on chronic diastolic heart failure (HCC) Suspect this is playing as much role event as anything in his worsening pulmonary status He had negative CT angiogram but this did show bilateral mild pleural effusions as well as pericardial  effusion which I suspect is related Has elevated BNP and a 10 pound weight gain this week Increase Lasix to 40 IV twice daily while here to see if this improves his feeling of shortness of breath Most recent echo was in 2021 we will repeat this  Atrial fibrillation, persistent (HCC) Rate controlled with Tikosyn (patient missed all doses of this yesterday and per pharmacy needs a Tikosyn order set to resume) and beta-blocker and calcium channel blocker. On Coumadin to be managed per pharmacy  Essential hypertension On diltiazem, Avapro, metoprolol     Advance Care Planning:   Code Status: Prior full code confirmed with patient and his daughter at bedside  Consults: None  Family Communication: Daughter at bedside  DVT prophylaxis: On therapeutic Coumadin  Severity of Illness: The appropriate patient status for this patient is INPATIENT. Inpatient status is judged to be reasonable and necessary in order to provide the required intensity of service to ensure the patient's safety. The patient's presenting symptoms, physical exam findings, and initial radiographic and laboratory data in the context of their chronic comorbidities is felt to place them at high risk for further clinical deterioration. Furthermore, it is not anticipated that the patient will be medically stable for discharge from the hospital within 2 midnights of admission.   * I certify that at the point of admission it is my clinical judgment that the patient will require inpatient hospital care spanning beyond 2 midnights from the point of admission due to high intensity of service, high risk for further deterioration and high frequency of surveillance required.*  Author: Reva Bores, MD 07/23/2023 7:42 AM  For on call review [email or web address].

## 2023-07-23 NOTE — Progress Notes (Signed)
Pt's home CPAP machine set up for the night. Pt instructed to have RN call RT assistance needed.

## 2023-07-23 NOTE — Assessment & Plan Note (Signed)
Has pulmonary outpatient follow-up scheduled for this week

## 2023-07-23 NOTE — Progress Notes (Signed)
ANTICOAGULATION CONSULT NOTE - Initial Consult  Pharmacy Consult for warfarin Indication: atrial fibrillation  Allergies  Allergen Reactions   Doxycycline Hives   Amiodarone     Caused neuropathy   Carbapenems    Cephalexin Other (See Comments)   Cephalosporins Other (See Comments)   Chlorpheniramine Other (See Comments)    Swells prostate    Ciprofloxacin     Pt can not take with amiodarone    Decongestant [Oxymetazoline]     All decongestant - makes prostate swell   Diltiazem Swelling    Left leg swelling   Flagyl [Metronidazole] Nausea Only    Stomach ache   Hctz [Hydrochlorothiazide]    Levaquin [Levofloxacin In D5w] Other (See Comments)    unknown   Levofloxacin Nausea Only   Lidocaine     Pt unsure of   Penicillins Swelling   Teline [Tetracycline] Swelling    Hospitalization for 2 weeks, prostate swelling   Tetanus Toxoid Swelling    Swells arms, Please pre-medicate with benadryl.   Z-Pak [Azithromycin] Other (See Comments)    Pt can not take with Amiodarone    Sulfonamide Derivatives Rash   Toprol Xl [Metoprolol Tartrate] Itching and Rash    Pt states he tolerates.    Patient Measurements:    Vital Signs: Temp: 97.9 F (36.6 C) (09/08 0700) Temp Source: Oral (09/08 0216) BP: 145/62 (09/08 0830) Pulse Rate: 51 (09/08 0830)  Labs: Recent Labs    07/22/23 2123 07/23/23 0247 07/23/23 0821  HGB 15.2 15.6  --   HCT 48.3 46.0  --   PLT 177  --   --   LABPROT 23.6*  --   --   INR 2.1*  --   --   CREATININE 1.46*  --   --   TROPONINIHS  --   --  9    CrCl cannot be calculated (Unknown ideal weight.).   Medical History: Past Medical History:  Diagnosis Date   Bladder cancer (HCC) 2010   Chronic bronchitis (HCC)    Hypertension    Hypoglycemia    On home oxygen therapy    "2L w/CPAP at night" (06/21/2017)   OSA on CPAP    "w/2L O2 at night" (06/21/2017)   Persistent atrial fibrillation (HCC)        Pneumonia    "when he was young; again in  2016" (06/21/2017)   Presence of permanent cardiac pacemaker    Sensory ataxia     Assessment: 89 YOM presenting with SOB, hx of Afib on warfarin PTA with INR on admission therapeutic at 2.1, last dose taken 9/6 per pt report  PTA dosing: 2.5 mg daily  Goal of Therapy:  INR 2-3 Monitor platelets by anticoagulation protocol: Yes   Plan:  Continue warfarin 2.5 mg PO daily Daily INR, s/s bleeding  Daylene Posey, PharmD, [facility] Clinical Pharmacist ED Pharmacist Phone # [phone] 07/23/2023 10:15 AM

## 2023-07-23 NOTE — Assessment & Plan Note (Addendum)
Rate controlled with Tikosyn (patient missed all doses of this yesterday and per pharmacy needs a Tikosyn order set to resume) and beta-blocker and calcium channel blocker. On Coumadin to be managed per pharmacy

## 2023-07-23 NOTE — Assessment & Plan Note (Signed)
On diltiazem, Avapro, metoprolol

## 2023-07-23 NOTE — Plan of Care (Signed)

## 2023-07-23 NOTE — ED Notes (Addendum)
ED TO INPATIENT HANDOFF REPORT  ED Nurse Name and Phone #: Beatris Ship RN [phone]  S Name/Age/Gender Chad [name] 89 y.o. male Room/Bed: 026C/026C  Code Status   Code Status: Full Code  Home/SNF/Other Home Patient oriented to: self, place, time, and situation Is this baseline? Yes   Triage Complete: Triage complete  Chief Complaint Acute exacerbation of idiopathic pulmonary fibrosis (HCC) [J84.112]  Triage Note Pt reports he has been shob all day today.  Took his furosemide at home and then another dose several hours later.  Reports he was weighing himself and his weight went down about 7 lb however he still was having a hard time breathing.  Pt reports he feels his ankles are swollen. Did try his prn home O2 without relief.  No CP    Allergies Allergies  Allergen Reactions   Doxycycline Hives   Amiodarone     Caused neuropathy   Carbapenems    Cephalexin Other (See Comments)   Cephalosporins Other (See Comments)   Chlorpheniramine Other (See Comments)    Swells prostate    Ciprofloxacin     Pt can not take with amiodarone    Decongestant [Oxymetazoline]     All decongestant - makes prostate swell   Diltiazem Swelling    Left leg swelling   Flagyl [Metronidazole] Nausea Only    Stomach ache   Hctz [Hydrochlorothiazide]    Levaquin [Levofloxacin In D5w] Other (See Comments)    unknown   Levofloxacin Nausea Only   Lidocaine     Pt unsure of   Penicillins Swelling   Teline [Tetracycline] Swelling    Hospitalization for 2 weeks, prostate swelling   Tetanus Toxoid Swelling    Swells arms, Please pre-medicate with benadryl.   Z-Pak [Azithromycin] Other (See Comments)    Pt can not take with Amiodarone    Sulfonamide Derivatives Rash   Toprol Xl [Metoprolol Tartrate] Itching and Rash    Pt states he tolerates.    Level of Care/Admitting Diagnosis ED Disposition     ED Disposition  Admit   Condition  --   Comment  Hospital Area: [facility] [[id number]]  Level of Care: Progressive [102]  Admit to Progressive based on following criteria: RESPIRATORY PROBLEMS hypoxemic/hypercapnic respiratory failure that is responsive to NIPPV (BiPAP) or High Flow Nasal Cannula (6-80 lpm). Frequent assessment/intervention, no > Q2 hrs < Q4 hrs, to maintain oxygenation and pulmonary hygiene.  May admit patient to [facility] or [facility] if equivalent level of care is available:: No  Covid Evaluation: Asymptomatic - no recent exposure (last 10 days) testing not required  Diagnosis: Acute exacerbation of idiopathic pulmonary fibrosis [facility]) [[id number]]  Admitting Physician: Lurline Del [[id number]]  Attending Physician: Lurline Del [[id number]]  Certification:: I certify this patient will need inpatient services for at least 2 midnights  Expected Medical Readiness: 07/26/2023          B Medical/Surgery History Past Medical History:  Diagnosis Date   Bladder cancer (HCC) 2010   Chronic bronchitis (HCC)    Hypertension    Hypoglycemia    On home oxygen therapy    "2L w/CPAP at night" (06/21/2017)   OSA on CPAP    "w/2L O2 at night" (06/21/2017)   Persistent atrial fibrillation (HCC)        Pneumonia    "when he was young; again in 2016" (06/21/2017)   Presence of permanent cardiac pacemaker    Sensory ataxia    Past  Surgical History:  Procedure Laterality Date   BACK SURGERY     CARDIOVERSION N/A 04/23/2015   Procedure: CARDIOVERSION;  Surgeon: Pricilla Riffle, MD;  Location: [facility] ENDOSCOPY;  Service: Cardiovascular;  Laterality: N/A;   CARDIOVERSION N/A 07/30/2015   Procedure: CARDIOVERSION;  Surgeon: Jake Bathe, MD;  Location: [facility] ENDOSCOPY;  Service: Cardiovascular;  Laterality: N/A;   EYE SURGERY Bilateral 2009   "to lower pressure; no glaucoma" (06/21/2017)   INSERT / REPLACE / REMOVE PACEMAKER  06/21/2017   LUMBAR DISC SURGERY  1987   PACEMAKER IMPLANT N/A 06/21/2017   Procedure: Pacemaker Implant;  Surgeon: Duke Salvia,  MD;  Location: [facility] INVASIVE CV LAB;  Service: Cardiovascular;  Laterality: N/A;   TRANSURETHRAL RESECTION OF BLADDER TUMOR WITH GYRUS (TURBT-GYRUS)  07/10/2009    Cystourethroscopy, gyrus TURBT (transurethral  resection of bladder tumor).Hattie Perch 03/16/2011     A IV Location/Drains/Wounds Patient Lines/Drains/Airways Status     Active Line/Drains/Airways     Name Placement date Placement time Site Days   Peripheral IV 07/23/23 18 G Right Antecubital 07/23/23  0046  Antecubital  less than 1   External Urinary Catheter 12/04/22  0428  --  231            Intake/Output Last 24 hours No intake or output data in the 24 hours ending 07/23/23 1239  Labs/Imaging Results for orders placed or performed during the hospital encounter of 07/22/23 (from the past 48 hour(s))  Basic metabolic panel     Status: Abnormal   Collection Time: 07/22/23  9:23 PM  Result Value Ref Range   Sodium 138 135 - 145 mmol/L   Potassium 4.0 3.5 - 5.1 mmol/L   Chloride 100 98 - 111 mmol/L   CO2 31 22 - 32 mmol/L   Glucose, Bld 119 (H) 70 - 99 mg/dL    Comment: Glucose reference range applies only to samples taken after fasting for at least 8 hours.   BUN 17 8 - 23 mg/dL   Creatinine, Ser 3.47 (H) 0.61 - 1.24 mg/dL   Calcium 8.8 (L) 8.9 - 10.3 mg/dL   GFR, Estimated 45 (L) >60 mL/min    Comment: (NOTE) Calculated using the CKD-EPI Creatinine Equation (2021)    Anion gap 7 5 - 15    Comment: Performed at [facility] Lab, [address], [place], [place] [address]  CBC     Status: None   Collection Time: 07/22/23  9:23 PM  Result Value Ref Range   WBC 7.3 4.0 - 10.5 K/uL   RBC 5.23 4.22 - 5.81 MIL/uL   Hemoglobin 15.2 13.0 - 17.0 g/dL   HCT 63.8 75.6 - 43.3 %   MCV 92.4 80.0 - 100.0 fL   MCH 29.1 26.0 - 34.0 pg   MCHC 31.5 30.0 - 36.0 g/dL   RDW 29.5 18.8 - 41.6 %   Platelets 177 150 - 400 K/uL   nRBC 0.0 0.0 - 0.2 %    Comment: Performed at [facility] Lab, [address], [place], [place]  [address]  Brain natriuretic peptide     Status: Abnormal   Collection Time: 07/22/23  9:23 PM  Result Value Ref Range   B Natriuretic Peptide 214.6 (H) 0.0 - 100.0 pg/mL    Comment: Performed at [facility] Lab, [address], [place], [place] [address]  Protime-INR     Status: Abnormal   Collection Time: 07/22/23  9:23 PM  Result Value Ref Range  Prothrombin Time 23.6 (H) 11.4 - 15.2 seconds   INR 2.1 (H) 0.8 - 1.2    Comment: (NOTE) INR goal varies based on device and disease states. Performed at [facility] Lab, [address] [place], [place] [address]   I-Stat arterial blood gas, ED [facility], [facility], [facility])     Status: Abnormal   Collection Time: 07/23/23  2:47 AM  Result Value Ref Range   pH, Arterial 7.365 7.35 - 7.45   pCO2 arterial 53.7 (H) 32 - 48 mmHg   pO2, Arterial 59 (L) 83 - 108 mmHg   Bicarbonate 30.8 (H) 20.0 - 28.0 mmol/L   TCO2 32 22 - 32 mmol/L   O2 Saturation 90 %   Acid-Base Excess 4.0 (H) 0.0 - 2.0 mmol/L   Sodium 139 135 - 145 mmol/L   Potassium 3.6 3.5 - 5.1 mmol/L   Calcium, Ion 1.25 1.15 - 1.40 mmol/L   HCT 46.0 39.0 - 52.0 %   Hemoglobin 15.6 13.0 - 17.0 g/dL   Patient temperature 24.4 C    Collection site RADIAL, ALLEN'S TEST ACCEPTABLE    Drawn by RT    Sample type ARTERIAL   Respiratory (~20 pathogens) panel by PCR     Status: None   Collection Time: 07/23/23  6:40 AM   Specimen: Nasopharyngeal Swab; Respiratory  Result Value Ref Range   Adenovirus NOT DETECTED NOT DETECTED   Coronavirus 229E NOT DETECTED NOT DETECTED    Comment: (NOTE) The Coronavirus on the Respiratory Panel, DOES NOT test for the novel  Coronavirus (2019 nCoV)    Coronavirus HKU1 NOT DETECTED NOT DETECTED   Coronavirus NL63 NOT DETECTED NOT DETECTED   Coronavirus OC43 NOT DETECTED NOT DETECTED   Metapneumovirus NOT DETECTED NOT DETECTED   Rhinovirus / Enterovirus NOT DETECTED NOT DETECTED   Influenza A NOT DETECTED NOT DETECTED   Influenza B NOT DETECTED NOT DETECTED    Parainfluenza Virus 1 NOT DETECTED NOT DETECTED   Parainfluenza Virus 2 NOT DETECTED NOT DETECTED   Parainfluenza Virus 3 NOT DETECTED NOT DETECTED   Parainfluenza Virus 4 NOT DETECTED NOT DETECTED   Respiratory Syncytial Virus NOT DETECTED NOT DETECTED   Bordetella pertussis NOT DETECTED NOT DETECTED   Bordetella Parapertussis NOT DETECTED NOT DETECTED   Chlamydophila pneumoniae NOT DETECTED NOT DETECTED   Mycoplasma pneumoniae NOT DETECTED NOT DETECTED    Comment: Performed at [facility] Lab, [address], [place], [place] [address]  Troponin I (High Sensitivity)     Status: None   Collection Time: 07/23/23  8:21 AM  Result Value Ref Range   Troponin I (High Sensitivity) 9 <18 ng/L    Comment: (NOTE) Elevated high sensitivity troponin I (hsTnI) values and significant  changes across serial measurements may suggest ACS but many other  chronic and acute conditions are known to elevate hsTnI results.  Refer to the "Links" section for chest pain algorithms and additional  guidance. Performed at [facility], [address] [place], [place] [address]   Basic metabolic panel     Status: Abnormal   Collection Time: 07/23/23 10:39 AM  Result Value Ref Range   Sodium 138 135 - 145 mmol/L   Potassium 3.8 3.5 - 5.1 mmol/L   Chloride 96 (L) 98 - 111 mmol/L   CO2 26 22 - 32 mmol/L   Glucose, Bld 239 (H) 70 - 99 mg/dL    Comment: Glucose reference range applies only to samples taken after fasting for  at least 8 hours.   BUN 15 8 - 23 mg/dL   Creatinine, Ser 8.65 (H) 0.61 - 1.24 mg/dL   Calcium 9.1 8.9 - 78.4 mg/dL   GFR, Estimated 52 (L) >60 mL/min    Comment: (NOTE) Calculated using the CKD-EPI Creatinine Equation (2021)    Anion gap 16 (H) 5 - 15    Comment: Performed at [facility], [address] [place], [place] [address]  Troponin I (High Sensitivity)     Status: None   Collection Time: 07/23/23 10:40 AM  Result Value Ref Range   Troponin I (High  Sensitivity) 13 <18 ng/L    Comment: (NOTE) Elevated high sensitivity troponin I (hsTnI) values and significant  changes across serial measurements may suggest ACS but many other  chronic and acute conditions are known to elevate hsTnI results.  Refer to the "Links" section for chest pain algorithms and additional  guidance. Performed at [facility] Lab, [address] [place], [place] [address]   Magnesium     Status: None   Collection Time: 07/23/23 10:40 AM  Result Value Ref Range   Magnesium 2.0 1.7 - 2.4 mg/dL    Comment: Performed at [facility], [address], [place], [place] [address]   CT Angio Chest PE W and/or Wo Contrast  Result Date: 07/23/2023 CLINICAL DATA:  Syncope/presyncope. Cerebrovascular cause suspected. Evaluate shortness of breath. Also states his ankles are swelling. EXAM: CT ANGIOGRAPHY CHEST WITH CONTRAST TECHNIQUE: Multidetector CT imaging of the chest was performed using the standard protocol during bolus administration of intravenous contrast. Multiplanar CT image reconstructions and MIPs were obtained to evaluate the vascular anatomy. RADIATION DOSE REDUCTION: This exam was performed according to the departmental dose-optimization program which includes automated exposure control, adjustment of the mA and/or kV according to patient size and/or use of iterative reconstruction technique. CONTRAST:  75mL OMNIPAQUE IOHEXOL 350 MG/ML SOLN COMPARISON:  Chest, abdomen and pelvis CT with contrast 08/08/2022, chest CT without contrast 06/13/2022. FINDINGS: Cardiovascular: Interval increased prominence of the pulmonary trunk measuring 3.3 cm indicating arterial hypertension. No arterial embolism is seen. Small circumferential pericardial effusion is slightly increased in the interval. There is artifact from a left chest pacemaker and leads in the right atrium and ventricle. The heart is slightly enlarged. There are calcifications in the coronary arteries, aorta and  great vessels. No aortic aneurysm is seen. Luminal opacification is insufficient to assess for dissection. The pulmonary veins are nondistended. Mediastinum/Nodes: No enlarged mediastinal, hilar, or axillary lymph nodes. Thyroid gland, trachea, and esophagus demonstrate no significant findings. Lungs/Pleura: There is diffuse bronchial thickening again noted, with subpleural reticulation and scattered honeycombing, with patchy areas of air trapping in the upper zones and chronic faint ground-glass disease in the lower zones. The findings are again greater on the right than the left without significant interval progression. There are scattered linear scar-like opacities in the bases. There is no typical zonal predilection to this and this is not associated with central bronchiectasis. There is no active infiltrate, no nodule is seen. There are trace pleural effusions. No pneumothorax. Upper Abdomen: No acute abnormality. Musculoskeletal: Osteopenia and degenerative changes and extensive bridging enthesopathy of the thoracic spine. No acute or other significant osseous abnormality. There is a 4.2 cm sebaceous cyst left of the midline in the upper chest wall, previously 3.6 cm. No suspicious chest wall abnormality. Review of the MIP images confirms the above findings. IMPRESSION: 1. No evidence of arterial embolus. 2. Interval  increased prominence of the pulmonary trunk. 3. Cardiomegaly with small circumferential pericardial effusion slightly increased in the interval. No venous dilatation or overt edema. 4. Aortic and coronary artery atherosclerosis. 5. Chronic interstitial lung disease without significant interval progression. 6. Trace pleural effusions, new in the interval. 7. 4.2 cm sebaceous cyst left of the midline in the upper chest wall, previously 3.6 cm. 8. Osteopenia and degenerative change with extensive spinal bridging enthesopathy. Electronically Signed   By: Almira Bar M.D.   On: 07/23/2023 05:16    DG Chest 2 View  Result Date: 07/22/2023 CLINICAL DATA:  COPD and shortness of breath.  Back pain. EXAM: CHEST - 2 VIEW COMPARISON:  Chest radiograph dated 12/04/2022 and CT dated 08/08/2022. FINDINGS: Background of emphysema and chronic interstitial coarsening. No focal consolidation, pleural effusion, or pneumothorax. Stable cardiac silhouette. Left pectoral pacemaker device. No acute osseous pathology. Degenerative changes of the spine. Old lower thoracic compression fracture with anterior wedging. IMPRESSION: 1. No active cardiopulmonary disease. 2. Emphysema. Electronically Signed   By: Elgie Collard M.D.   On: 07/22/2023 22:13    Pending Labs Unresulted Labs (From admission, onward)     Start     Ordered   07/24/23 0500  Comprehensive metabolic panel  Daily,   R      07/23/23 0829   07/24/23 0500  CBC  Daily,   R      07/23/23 0829   07/24/23 0500  Protime-INR  Daily,   R      07/23/23 0829   07/24/23 0500  Magnesium  Daily,   R      07/23/23 1008   07/23/23 0225  .Cooxemetry Panel (carboxy, met, total hgb, O2 sat)(NOT AT [facility] or [facility])  Once,   R        07/23/23 0225            Vitals/Pain Today's Vitals   07/23/23 1101 07/23/23 1102 07/23/23 1115 07/23/23 1130  BP:    125/62  Pulse: 83 98 67 67  Resp:    (!) 23  Temp:      TempSrc:      SpO2: 91% 93% 96% 95%  PainSc:        Isolation Precautions Droplet precaution  Medications Medications  irbesartan (AVAPRO) tablet 75 mg (75 mg Oral Given 07/23/23 1131)  metoprolol succinate (TOPROL-XL) 24 hr tablet 100 mg (100 mg Oral Given 07/23/23 1031)  vitamin B-12 (CYANOCOBALAMIN) tablet 500 mcg (500 mcg Oral Given 07/23/23 1131)  cholecalciferol (VITAMIN D3) 25 MCG (1000 UNIT) tablet 2,000 Units (2,000 Units Oral Given 07/23/23 1032)  multivitamin with minerals tablet 1 tablet (1 tablet Oral Given 07/23/23 1031)  furosemide (LASIX) injection 40 mg (40 mg Intravenous Given 07/23/23 1036)  potassium chloride SA (KLOR-CON M) CR  tablet 20 mEq (20 mEq Oral Given 07/23/23 1031)  acetaminophen (TYLENOL) tablet 650 mg (has no administration in time range)    Or  acetaminophen (TYLENOL) suppository 650 mg (has no administration in time range)  fentaNYL (SUBLIMAZE) injection 12.5-50 mcg (has no administration in time range)  polyethylene glycol (MIRALAX / GLYCOLAX) packet 17 g (has no administration in time range)  ondansetron (ZOFRAN) tablet 4 mg (has no administration in time range)    Or  ondansetron (ZOFRAN) injection 4 mg (has no administration in time range)  albuterol (PROVENTIL) (2.5 MG/3ML) 0.083% nebulizer solution 2.5 mg (has no administration in time range)  guaiFENesin (MUCINEX) 12 hr tablet 600 mg (has no administration in time range)  escitalopram (LEXAPRO) tablet 10 mg (has no administration in time range)  sodium chloride flush (NS) 0.9 % injection 3 mL (0 mLs Intravenous Hold 07/23/23 1011)  sodium chloride flush (NS) 0.9 % injection 3 mL (has no administration in time range)  0.9 %  sodium chloride infusion (has no administration in time range)  dofetilide (TIKOSYN) capsule 250 mcg (has no administration in time range)  diltiazem (CARDIZEM) tablet 30 mg (30 mg Oral Given 07/23/23 1030)  Warfarin - Pharmacist Dosing Inpatient (has no administration in time range)  warfarin (COUMADIN) tablet 2.5 mg (has no administration in time range)  potassium chloride SA (KLOR-CON M) CR tablet 20 mEq (has no administration in time range)  magnesium sulfate IVPB 2 g 50 mL (has no administration in time range)  iohexol (OMNIPAQUE) 350 MG/ML injection 75 mL (75 mLs Intravenous Contrast Given 07/23/23 0207)  ipratropium-albuterol (DUONEB) 0.5-2.5 (3) MG/3ML nebulizer solution 3 mL (3 mLs Nebulization Given 07/23/23 0240)  methylPREDNISolone sodium succinate (SOLU-MEDROL) 125 mg/2 mL injection 125 mg (125 mg Intravenous Given 07/23/23 0550)  albuterol (PROVENTIL) (2.5 MG/3ML) 0.083% nebulizer solution 5 mg (5 mg Nebulization Given  07/23/23 0548)  warfarin (COUMADIN) tablet 2.5 mg (2.5 mg Oral Given 07/23/23 1031)    Mobility walks with device     Focused Assessments Pulmonary Assessment Handoff:  Lung sounds:   O2 Device: Nasal Cannula O2 Flow Rate (L/min): 2 L/min    R Recommendations: See Admitting Provider Note  Report given to: 4E04

## 2023-07-23 NOTE — Assessment & Plan Note (Signed)
Compliant with this CPAP at night ordered

## 2023-07-23 NOTE — Progress Notes (Addendum)
Pharmacy: Dofetilide (Tikosyn) - Initial Consult Assessment and Electrolyte Replacement  Pharmacy consulted to assist in monitoring and replacing electrolytes in this 89 y.o. male admitted on 07/22/2023 undergoing dofetilide re-initiation. First dofetilide dose: 250mg   Assessment:  Patient Exclusion Criteria: If any screening criteria checked as "Yes", then  patient  should NOT receive dofetilide until criteria item is corrected.  If "Yes" please indicate correction plan.  YES  NO Patient  Exclusion Criteria Correction Plan   [x]   []   Baseline QTc interval is greater than or equal to 440 msec. IF above YES box checked dofetilide contraindicated unless patient has ICD; then may proceed if QTc 500-550 msec or with known ventricular conduction abnormalities may proceed with QTc 550-600 msec. QTc = 467  Known abnormalities   []   [x]   Patient is known or suspected to have a digoxin level greater than 2 ng/ml: No results found for: "DIGOXIN"     []   [x]   Creatinine clearance less than 20 ml/min (calculated using Cockcroft-Gault, actual body weight and serum creatinine): CrCl cannot be calculated (Unknown ideal weight.).     []   [x]  Patient has received drugs known to prolong the QT intervals within the last 48 hours (phenothiazines, tricyclics or tetracyclic antidepressants, erythromycin, H-1 antihistamines, cisapride, fluoroquinolones, azithromycin, ondansetron).   Updated information on QT prolonging agents is available to be searched on the following database:QT prolonging agents     []   [x]   Patient received a dose of hydrochlorothiazide (Oretic) alone or in any combination including triamterene (Dyazide, Maxzide) in the last 48 hours.    []   [x]  Patient received a medication known to increase dofetilide plasma concentrations prior to initial dofetilide dose:  Trimethoprim (Primsol, Proloprim) in the last 36 hours Verapamil (Calan, Verelan) in the last 36 hours or a sustained  release dose in the last 72 hours Megestrol (Megace) in the last 5 days  Cimetidine (Tagamet) in the last 6 hours Ketoconazole (Nizoral) in the last 24 hours Itraconazole (Sporanox) in the last 48 hours  Prochlorperazine (Compazine) in the last 36 hours     []   [x]   Patient is known to have a history of torsades de pointes; congenital or acquired long QT syndromes.    []   [x]   Patient has received a Class 1 antiarrhythmic with less than 2 half-lives since last dose. (Disopyramide, Quinidine, Procainamide, Lidocaine, Mexiletine, Flecainide, Propafenone)    []   [x]   Patient has received amiodarone therapy in the past 3 months or amiodarone level is greater than 0.3 ng/ml.    Labs:    Component Value Date/Time   K 3.8 07/23/2023 1039   MG 2.0 07/23/2023 1040     Plan: [facility] One Calculated CrCl  Dose q12h  []  > 60 ml/min 500 mcg  [x]  40-60 ml/min 250 mcg  []  20-40 ml/min 125 mcg   [x]   Physician selected initial dose within range recommended for patients level of renal function - will monitor for response.  []   Physician selected initial dose outside of range recommended for patients level of renal function - will discuss if the dose should be altered at this time.   Patient has been appropriately anticoagulated with Warfarin.  Potassium: K 3.8-3.9:  Hold Tikosyn initiation and give KCl 40 mEq po x1 then begin Tikosyn at least 2hr after KCl dose - do not need to recheck K   Magnesium: Mg 1.8-2: Give Mg 2 gm IV x1 to prevent Mg from dropping below 1.8 - do not need to  recheck Mg. Appropriate to initiate Tikosyn   Thank you for allowing pharmacy to participate in this patient's care   Daylene Posey 07/23/2023  11:51 AM

## 2023-07-23 NOTE — Progress Notes (Signed)
Lower extremity venous left study completed.   Please see CV Proc for preliminary results.   Rachel Hodge, RDMS, RVT  

## 2023-07-23 NOTE — ED Notes (Signed)
Pt is going to Vascular before going up to 4E04, Vascular was informed & 4E was called as well.

## 2023-07-23 NOTE — Assessment & Plan Note (Addendum)
Suspect this is playing as much role event as anything in his worsening pulmonary status He had negative CT angiogram but this did show bilateral mild pleural effusions as well as pericardial effusion which I suspect is related Has elevated BNP and a 10 pound weight gain this week Increase Lasix to 40 IV twice daily while here to see if this improves his feeling of shortness of breath Most recent echo was in 2021 we will repeat this

## 2023-07-23 NOTE — Assessment & Plan Note (Signed)
Truly doubt DVT given that he is on anticoagulation and therapeutic however given the discordancy and exam we will just rule this out

## 2023-07-24 ENCOUNTER — Inpatient Hospital Stay (HOSPITAL_COMMUNITY): Payer: Medicare Other

## 2023-07-24 ENCOUNTER — Other Ambulatory Visit (HOSPITAL_COMMUNITY): Payer: Self-pay

## 2023-07-24 DIAGNOSIS — I5043 Acute on chronic combined systolic (congestive) and diastolic (congestive) heart failure: Secondary | ICD-10-CM

## 2023-07-24 DIAGNOSIS — Z950 Presence of cardiac pacemaker: Secondary | ICD-10-CM | POA: Diagnosis not present

## 2023-07-24 DIAGNOSIS — I5021 Acute systolic (congestive) heart failure: Secondary | ICD-10-CM | POA: Diagnosis not present

## 2023-07-24 DIAGNOSIS — J849 Interstitial pulmonary disease, unspecified: Secondary | ICD-10-CM | POA: Diagnosis not present

## 2023-07-24 DIAGNOSIS — J84112 Idiopathic pulmonary fibrosis: Secondary | ICD-10-CM

## 2023-07-24 DIAGNOSIS — I480 Paroxysmal atrial fibrillation: Secondary | ICD-10-CM

## 2023-07-24 LAB — CBC
HCT: 48.6 % (ref 39.0–52.0)
Hemoglobin: 15.7 g/dL (ref 13.0–17.0)
MCH: 29.8 pg (ref 26.0–34.0)
MCHC: 32.3 g/dL (ref 30.0–36.0)
MCV: 92.4 fL (ref 80.0–100.0)
Platelets: 196 10*3/uL (ref 150–400)
RBC: 5.26 MIL/uL (ref 4.22–5.81)
RDW: 14.2 % (ref 11.5–15.5)
WBC: 14.4 10*3/uL — ABNORMAL HIGH (ref 4.0–10.5)
nRBC: 0 % (ref 0.0–0.2)

## 2023-07-24 LAB — COMPREHENSIVE METABOLIC PANEL
ALT: 14 U/L (ref 0–44)
AST: 21 U/L (ref 15–41)
Albumin: 3.3 g/dL — ABNORMAL LOW (ref 3.5–5.0)
Alkaline Phosphatase: 71 U/L (ref 38–126)
Anion gap: 7 (ref 5–15)
BUN: 23 mg/dL (ref 8–23)
CO2: 33 mmol/L — ABNORMAL HIGH (ref 22–32)
Calcium: 9.5 mg/dL (ref 8.9–10.3)
Chloride: 96 mmol/L — ABNORMAL LOW (ref 98–111)
Creatinine, Ser: 1.62 mg/dL — ABNORMAL HIGH (ref 0.61–1.24)
GFR, Estimated: 40 mL/min — ABNORMAL LOW (ref >60)
Glucose, Bld: 125 mg/dL — ABNORMAL HIGH (ref 70–99)
Potassium: 4.3 mmol/L (ref 3.5–5.1)
Sodium: 136 mmol/L (ref 135–145)
Total Bilirubin: 0.6 mg/dL (ref 0.3–1.2)
Total Protein: 6.2 g/dL — ABNORMAL LOW (ref 6.5–8.1)

## 2023-07-24 LAB — MAGNESIUM: Magnesium: 2.3 mg/dL (ref 1.7–2.4)

## 2023-07-24 LAB — PROTIME-INR
INR: 1.9 — ABNORMAL HIGH (ref 0.8–1.2)
Prothrombin Time: 21.6 s — ABNORMAL HIGH (ref 11.4–15.2)

## 2023-07-24 LAB — ECHOCARDIOGRAM COMPLETE
Area-P 1/2: 2.86 cm2
Height: 69 in
S' Lateral: 4 cm
Weight: 3767.22 [oz_av]

## 2023-07-24 MED ORDER — WARFARIN SODIUM 2.5 MG PO TABS: 2.5000 mg | ORAL_TABLET | Freq: Every day | ORAL | Status: DC

## 2023-07-24 MED ORDER — METHYLPREDNISOLONE SODIUM SUCC 125 MG IJ SOLR
80.0000 mg | Freq: Every day | INTRAMUSCULAR | Status: DC
  Administered 2023-07-24: 80 mg via INTRAVENOUS
  Filled 2023-07-24: qty 2

## 2023-07-24 MED ORDER — WARFARIN SODIUM 2 MG PO TABS
3.0000 mg | ORAL_TABLET | Freq: Once | ORAL | Status: AC
  Administered 2023-07-24: 3 mg via ORAL
  Filled 2023-07-24: qty 1

## 2023-07-24 MED ORDER — PERFLUTREN LIPID MICROSPHERE
1.0000 mL | INTRAVENOUS | Status: AC | PRN
  Administered 2023-07-24: 4 mL via INTRAVENOUS

## 2023-07-24 NOTE — Progress Notes (Signed)
  Echocardiogram 2D Echocardiogram has been performed.  Leda Roys RDCS 07/24/2023, 2:12 PM

## 2023-07-24 NOTE — Progress Notes (Addendum)
PROGRESS NOTE    Chad [name]  AYT:[id number] DOB: Nov 27, 1928 DOA: 07/22/2023 PCP: [facility]   Brief Narrative:  89 y.o. male with medical history significant of  idiopathic pulmonary fibrosis, chronic respiratory failure with hypoxia (supposed to be wearing supplemental oxygen but only wears as needed), A-fib on Coumadin, HTN, HFpEF, history of V. tach, OSA on CPAP presented with worsening shortness of breath along with weight gain.  On presentation, he required 2 L oxygen via nasal cannula.  CTA chest showed no PE and showed chronic interstitial lung disease without significant interval progression.  He was started on IV Solu-Medrol and Lasix.  Assessment & Plan:   Possible acute on chronic diastolic heart failure -Presented with worsening shortness of breath and weight gain. -Currently on Lasix 40 mg IV twice daily.  Also on metoprolol succinate.  Strict input and output.  Daily weights.  Fluid restriction. Follow 2D echo -I have consulted cardiology.  Follow recommendations  Possible acute exacerbation of idiopathic pulmonary fibrosis Chronic respiratory failure with hypoxia -Received IV Solu-Medrol on presentation.  Continue Solu-Medrol 80 mg daily.  Use nebs as needed.  I have consulted pulmonary.  Follow recommendations. -He is supposed to use supplemental oxygen at home but uses it only as needed  Left lower extremity swelling -Questionable cause.  Lower extremity duplex negative for DVT  Persistent A-fib -Rate controlled on Tikosyn.  Continue Tikosyn.  Continue Cardizem and metoprolol succinate.  Follow cardiology recommendations.  Continue Coumadin.  Pharmacy helping to dose Coumadin.  Monitor INR.  History of permanent pacemaker -Cardiology consulted  CKD stage IIIa -Creatinine baseline is around 1.2-1.4.  Creatinine slightly bumped up today to 1.62.  Monitor.  Essential hypertension -Continue Lasix and metoprolol  succinate  Leukocytosis -monitor  OSA -Continue CPAP at night  Goals of care -Currently listed as full code.  Consult palliative care for goals of care discussion  DVT prophylaxis: Coumadin Code Status: Full Family Communication: None at bedside Disposition Plan: Status is: Inpatient Remains inpatient appropriate because: Of severity of illness  Consultants: Palliative care/pulmonary/cardiology  Procedures: None  Antimicrobials: None   Subjective: Patient seen and examined at bedside.  Feels slightly better but still short of breath with exertion.  No fever, chest pain, vomiting reported.  Objective: Vitals:   07/23/23 2219 07/24/23 0021 07/24/23 0525 07/24/23 0746  BP:  139/75 (!) 153/71 117/67  Pulse: 65 64 65 62  Resp: 20 16 20 18   Temp:  (!) 97.4 F (36.3 C) 97.7 F (36.5 C) (!) 97.2 F (36.2 C)  TempSrc:  Axillary Axillary Oral  SpO2: 92% 93% 91% 93%  Weight:   106.8 kg     Intake/Output Summary (Last 24 hours) at 07/24/2023 1043 Last data filed at 07/23/2023 2200 Gross per 24 hour  Intake 340 ml  Output --  Net 340 ml   Filed Weights   07/24/23 0525  Weight: 106.8 kg    Examination:  General exam: Appears calm and comfortable.  Elderly male lying in bed.   Respiratory system: Bilateral decreased breath sounds at bases with scattered crackles Cardiovascular system: S1 & S2 heard, Rate controlled Gastrointestinal system: Abdomen is nondistended, soft and nontender. Normal bowel sounds heard. Extremities: No cyanosis, clubbing; left lower extremity edema present Central nervous system: Awake, slow to respond.  Poor historian.  No focal neurological deficits. Moving extremities Skin: No rashes, lesions or ulcers Psychiatry: Flat affect.  Not agitated.   Data Reviewed: I have personally reviewed following labs and imaging  studies  CBC: Recent Labs  Lab 07/22/23 2123 07/23/23 0247 07/24/23 0321  WBC 7.3  --  14.4*  HGB 15.2 15.6 15.7  HCT 48.3  46.0 48.6  MCV 92.4  --  92.4  PLT 177  --  196   Basic Metabolic Panel: Recent Labs  Lab 07/22/23 2123 07/23/23 0247 07/23/23 1039 07/23/23 1040 07/24/23 0321  NA 138 139 138  --  136  K 4.0 3.6 3.8  --  4.3  CL 100  --  96*  --  96*  CO2 31  --  26  --  33*  GLUCOSE 119*  --  239*  --  125*  BUN 17  --  15  --  23  CREATININE 1.46*  --  1.30*  --  1.62*  CALCIUM 8.8*  --  9.1  --  9.5  MG  --   --   --  2.0 2.3   GFR: Estimated Creatinine Clearance: 35 mL/min (A) (by C-G formula based on SCr of 1.62 mg/dL (H)). Liver Function Tests: Recent Labs  Lab 07/24/23 0321  AST 21  ALT 14  ALKPHOS 71  BILITOT 0.6  PROT 6.2*  ALBUMIN 3.3*   No results for input(s): "LIPASE", "AMYLASE" in the last 168 hours. No results for input(s): "AMMONIA" in the last 168 hours. Coagulation Profile: Recent Labs  Lab 07/22/23 2123 07/24/23 0321  INR 2.1* 1.9*   Cardiac Enzymes: No results for input(s): "CKTOTAL", "CKMB", "CKMBINDEX", "TROPONINI" in the last 168 hours. BNP (last 3 results) No results for input(s): "PROBNP" in the last 8760 hours. HbA1C: No results for input(s): "HGBA1C" in the last 72 hours. CBG: No results for input(s): "GLUCAP" in the last 168 hours. Lipid Profile: No results for input(s): "CHOL", "HDL", "LDLCALC", "TRIG", "CHOLHDL", "LDLDIRECT" in the last 72 hours. Thyroid Function Tests: No results for input(s): "TSH", "T4TOTAL", "FREET4", "T3FREE", "THYROIDAB" in the last 72 hours. Anemia Panel: No results for input(s): "VITAMINB12", "FOLATE", "FERRITIN", "TIBC", "IRON", "RETICCTPCT" in the last 72 hours. Sepsis Labs: No results for input(s): "PROCALCITON", "LATICACIDVEN" in the last 168 hours.  Recent Results (from the past 240 hour(s))  Respiratory (~20 pathogens) panel by PCR     Status: None   Collection Time: 07/23/23  6:40 AM   Specimen: Nasopharyngeal Swab; Respiratory  Result Value Ref Range Status   Adenovirus NOT DETECTED NOT DETECTED Final    Coronavirus 229E NOT DETECTED NOT DETECTED Final    Comment: (NOTE) The Coronavirus on the Respiratory Panel, DOES NOT test for the novel  Coronavirus (2019 nCoV)    Coronavirus HKU1 NOT DETECTED NOT DETECTED Final   Coronavirus NL63 NOT DETECTED NOT DETECTED Final   Coronavirus OC43 NOT DETECTED NOT DETECTED Final   Metapneumovirus NOT DETECTED NOT DETECTED Final   Rhinovirus / Enterovirus NOT DETECTED NOT DETECTED Final   Influenza A NOT DETECTED NOT DETECTED Final   Influenza B NOT DETECTED NOT DETECTED Final   Parainfluenza Virus 1 NOT DETECTED NOT DETECTED Final   Parainfluenza Virus 2 NOT DETECTED NOT DETECTED Final   Parainfluenza Virus 3 NOT DETECTED NOT DETECTED Final   Parainfluenza Virus 4 NOT DETECTED NOT DETECTED Final   Respiratory Syncytial Virus NOT DETECTED NOT DETECTED Final   Bordetella pertussis NOT DETECTED NOT DETECTED Final   Bordetella Parapertussis NOT DETECTED NOT DETECTED Final   Chlamydophila pneumoniae NOT DETECTED NOT DETECTED Final   Mycoplasma pneumoniae NOT DETECTED NOT DETECTED Final    Comment: Performed at [facility]  Lab, [address] [place], [place] [address]         Radiology Studies: VAS [place] LOWER EXTREMITY VENOUS (DVT)  Result Date: 07/23/2023  Lower Venous DVT Study Patient Name:  Chad [name]  Date of Exam:   07/23/2023 Medical Rec #: [id number]        Accession #:    [id number] Date of Birth: 23-Nov-1928         Patient Gender: M Patient Age:   69 years Exam Location:  [facility] Procedure:      VAS [place] LOWER EXTREMITY VENOUS (DVT) Referring Phys: Kenney Houseman PRATT --------------------------------------------------------------------------------  Indications: Swelling LT>RT in setting of acute on chronic CHF.  Anticoagulation: Coumadin. Comparison Study: 08-25-2022 Prior RT lower extremity venous negative for DVT. Performing Technologist: Jean Rosenthal RDMS, RVT  Examination Guidelines: A complete evaluation includes B-mode imaging,  spectral Doppler, color Doppler, and power Doppler as needed of all accessible portions of each vessel. Bilateral testing is considered an integral part of a complete examination. Limited examinations for reoccurring indications may be performed as noted. The reflux portion of the exam is performed with the patient in reverse Trendelenburg.  +-----+---------------+---------+-----------+----------+--------------+ RIGHTCompressibilityPhasicitySpontaneityPropertiesThrombus Aging +-----+---------------+---------+-----------+----------+--------------+ CFV  Full           Yes      Yes                                 +-----+---------------+---------+-----------+----------+--------------+   +---------+---------------+---------+-----------+----------+--------------+ LEFT     CompressibilityPhasicitySpontaneityPropertiesThrombus Aging +---------+---------------+---------+-----------+----------+--------------+ CFV      Full           Yes      Yes                                 +---------+---------------+---------+-----------+----------+--------------+ SFJ      Full                                                        +---------+---------------+---------+-----------+----------+--------------+ FV Prox  Full                                                        +---------+---------------+---------+-----------+----------+--------------+ FV Mid   Full                                                        +---------+---------------+---------+-----------+----------+--------------+ FV DistalFull                                                        +---------+---------------+---------+-----------+----------+--------------+ PFV      Full                                                        +---------+---------------+---------+-----------+----------+--------------+  POP      Full           Yes      Yes                                  +---------+---------------+---------+-----------+----------+--------------+ PTV      Full                                                        +---------+---------------+---------+-----------+----------+--------------+ PERO     Full                                                        +---------+---------------+---------+-----------+----------+--------------+     Summary: RIGHT: - No evidence of common femoral vein obstruction.  LEFT: - There is no evidence of deep vein thrombosis in the lower extremity.  - No cystic structure found in the popliteal fossa.  *See table(s) above for measurements and observations.    Preliminary    CT Angio Chest PE W and/or Wo Contrast  Result Date: 07/23/2023 CLINICAL DATA:  Syncope/presyncope. Cerebrovascular cause suspected. Evaluate shortness of breath. Also states his ankles are swelling. EXAM: CT ANGIOGRAPHY CHEST WITH CONTRAST TECHNIQUE: Multidetector CT imaging of the chest was performed using the standard protocol during bolus administration of intravenous contrast. Multiplanar CT image reconstructions and MIPs were obtained to evaluate the vascular anatomy. RADIATION DOSE REDUCTION: This exam was performed according to the departmental dose-optimization program which includes automated exposure control, adjustment of the mA and/or kV according to patient size and/or use of iterative reconstruction technique. CONTRAST:  75mL OMNIPAQUE IOHEXOL 350 MG/ML SOLN COMPARISON:  Chest, abdomen and pelvis CT with contrast 08/08/2022, chest CT without contrast 06/13/2022. FINDINGS: Cardiovascular: Interval increased prominence of the pulmonary trunk measuring 3.3 cm indicating arterial hypertension. No arterial embolism is seen. Small circumferential pericardial effusion is slightly increased in the interval. There is artifact from a left chest pacemaker and leads in the right atrium and ventricle. The heart is slightly enlarged. There are calcifications in the  coronary arteries, aorta and great vessels. No aortic aneurysm is seen. Luminal opacification is insufficient to assess for dissection. The pulmonary veins are nondistended. Mediastinum/Nodes: No enlarged mediastinal, hilar, or axillary lymph nodes. Thyroid gland, trachea, and esophagus demonstrate no significant findings. Lungs/Pleura: There is diffuse bronchial thickening again noted, with subpleural reticulation and scattered honeycombing, with patchy areas of air trapping in the upper zones and chronic faint ground-glass disease in the lower zones. The findings are again greater on the right than the left without significant interval progression. There are scattered linear scar-like opacities in the bases. There is no typical zonal predilection to this and this is not associated with central bronchiectasis. There is no active infiltrate, no nodule is seen. There are trace pleural effusions. No pneumothorax. Upper Abdomen: No acute abnormality. Musculoskeletal: Osteopenia and degenerative changes and extensive bridging enthesopathy of the thoracic spine. No acute or other significant osseous abnormality. There is a 4.2 cm sebaceous cyst left of the midline in the upper chest wall, previously 3.6 cm. No suspicious chest wall abnormality. Review of  the MIP images confirms the above findings. IMPRESSION: 1. No evidence of arterial embolus. 2. Interval increased prominence of the pulmonary trunk. 3. Cardiomegaly with small circumferential pericardial effusion slightly increased in the interval. No venous dilatation or overt edema. 4. Aortic and coronary artery atherosclerosis. 5. Chronic interstitial lung disease without significant interval progression. 6. Trace pleural effusions, new in the interval. 7. 4.2 cm sebaceous cyst left of the midline in the upper chest wall, previously 3.6 cm. 8. Osteopenia and degenerative change with extensive spinal bridging enthesopathy. Electronically Signed   By: Almira Bar  M.D.   On: 07/23/2023 05:16   DG Chest 2 View  Result Date: 07/22/2023 CLINICAL DATA:  COPD and shortness of breath.  Back pain. EXAM: CHEST - 2 VIEW COMPARISON:  Chest radiograph dated 12/04/2022 and CT dated 08/08/2022. FINDINGS: Background of emphysema and chronic interstitial coarsening. No focal consolidation, pleural effusion, or pneumothorax. Stable cardiac silhouette. Left pectoral pacemaker device. No acute osseous pathology. Degenerative changes of the spine. Old lower thoracic compression fracture with anterior wedging. IMPRESSION: 1. No active cardiopulmonary disease. 2. Emphysema. Electronically Signed   By: Elgie Collard M.D.   On: 07/22/2023 22:13        Scheduled Meds:  cholecalciferol  2,000 Units Oral BID   cyanocobalamin  500 mcg Oral BID   diltiazem  30 mg Oral Q8H   dofetilide  250 mcg Oral BID   escitalopram  10 mg Oral QHS   furosemide  40 mg Intravenous BID   irbesartan  75 mg Oral Daily   metoprolol succinate  100 mg Oral BID   multivitamin with minerals  1 tablet Oral Daily   potassium chloride  20 mEq Oral BID   sodium chloride flush  3 mL Intravenous Q12H   warfarin  2.5 mg Oral q1600   Warfarin - Pharmacist Dosing Inpatient   Does not apply q1600   Continuous Infusions:  sodium chloride            Glade Lloyd, MD [facility] 07/24/2023, 10:43 AM

## 2023-07-24 NOTE — Consult Note (Signed)
NAME:  Chad [name], MRN:  [id number], DOB:  1929/09/29, LOS: 1 ADMISSION DATE:  07/22/2023, CONSULTATION DATE:  07/24/2023 REFERRING MD:  Glade Lloyd, MD, CHIEF COMPLAINT:  respiratory failure   History of Present Illness:  Patient is a 89 year old gentleman with a past medical history, atrial fibrillation on Eliquis, interstitial lung disease(IPF) on intermittent home oxygen 2 L nasal cannula. He follows with Dr.  Delton Coombes in clinic and is not on anti-fibrotic therapy. Lives independently. Lately has not been taking daily lasix due to having doctors appointments in the afternoon and having longer car rides. Had some weight gain and worsening dyspnea on exertion. CT scan shows bilateral pleural effusions. BNP elevated. Now needing 2LNC continuously rather than prn.  Since presentation he has been diuresed and started on high dose steroids. PCCM consulted for further management.   Pertinent  Medical History  CAD HTN A. Fib on dofetilide, eliquis OSA on CPAP  Significant Hospital Events: Including procedures, antibiotic start and stop dates in addition to other pertinent events     Interim History / Subjective:    Objective   Blood pressure 117/67, pulse 62, temperature (!) 97.2 F (36.2 C), temperature source Oral, resp. rate 18, weight 106.8 kg, SpO2 93%.        Intake/Output Summary (Last 24 hours) at 07/24/2023 1115 Last data filed at 07/23/2023 2200 Gross per 24 hour  Intake 340 ml  Output --  Net 340 ml   Filed Weights   07/24/23 0525  Weight: 106.8 kg    Examination: General: elderly, well preserved, on nasal cannula HENT: mmm Lungs: bibasilar crackles, no wheeze, breathing is nonlabored. He was satting 92-99% on room air for a while Cardiovascular: RRR no mrg Abdomen: soft, nontender Extremities: no peripheral edema Neuro: HOH, normal speech, no focal asymmetry    Resolved Hospital Problem list     Assessment & Plan:   Chronic respiratory failure ILD - IPF  not in exacerbation Acute on chronic decompensated HFpEF OSA on CPAP  CT Scan personally reviewed from this admission shows trace bilateral pleural effusions and stable UIP pattern of disease without evidence of flare or pneumonia. Stop steroids. No indication for antibiotics. Suspect he just need to wear oxygen 24/7 now. I discussed at length with the patient and his daughter that he needs to keep oxygen saturations over 88%. We also broached goals of care - the reason for not treating IPF previously was due to relatively mild symptoms and desire to avoid side effects of anti-fibrotic therapy. I discussed with his daughter that in his advanced age, since we are not treating this disease, in the even of a respiratory arrest I would recommend DNI and in cardiac arrest recommend DNR. I recommend they continue to talk with Dr. Delton Coombes about this in the clinic. He has an appointment with him tomorrow and I talked with Dr. Hanley Ben the plan to stop steroids and optimize him for discharge. Agree with another day of IV diuresis and hopefully can transition him to his oral regimen tomorrow.   Ok for discharge tomorrow. Please call with additional questions.   Durel Salts, MD Pulmonary and Critical Care Medicine [facility] 07/24/2023 1:07 PM Pager: see AMION  If no response to pager, please call critical care on call (see AMION) until 7pm After 7:00 pm call Elink     Labs   CBC: Recent Labs  Lab 07/22/23 2123 07/23/23 0247 07/24/23 0321  WBC 7.3  --  14.4*  HGB 15.2 15.6 15.7  HCT 48.3 46.0 48.6  MCV 92.4  --  92.4  PLT 177  --  196    Basic Metabolic Panel: Recent Labs  Lab 07/22/23 2123 07/23/23 0247 07/23/23 1039 07/23/23 1040 07/24/23 0321  NA 138 139 138  --  136  K 4.0 3.6 3.8  --  4.3  CL 100  --  96*  --  96*  CO2 31  --  26  --  33*  GLUCOSE 119*  --  239*  --  125*  BUN 17  --  15  --  23  CREATININE 1.46*  --  1.30*  --  1.62*  CALCIUM 8.8*  --  9.1  --  9.5  MG   --   --   --  2.0 2.3   GFR: Estimated Creatinine Clearance: 35 mL/min (A) (by C-G formula based on SCr of 1.62 mg/dL (H)). Recent Labs  Lab 07/22/23 2123 07/24/23 0321  WBC 7.3 14.4*    Liver Function Tests: Recent Labs  Lab 07/24/23 0321  AST 21  ALT 14  ALKPHOS 71  BILITOT 0.6  PROT 6.2*  ALBUMIN 3.3*   No results for input(s): "LIPASE", "AMYLASE" in the last 168 hours. No results for input(s): "AMMONIA" in the last 168 hours.  ABG    Component Value Date/Time   PHART 7.365 07/23/2023 0247   PCO2ART 53.7 (H) 07/23/2023 0247   PO2ART 59 (L) 07/23/2023 0247   HCO3 30.8 (H) 07/23/2023 0247   TCO2 32 07/23/2023 0247   O2SAT 90 07/23/2023 0247     Coagulation Profile: Recent Labs  Lab 07/22/23 2123 07/24/23 0321  INR 2.1* 1.9*    Cardiac Enzymes: No results for input(s): "CKTOTAL", "CKMB", "CKMBINDEX", "TROPONINI" in the last 168 hours.  HbA1C: No results found for: "HGBA1C"  CBG: No results for input(s): "GLUCAP" in the last 168 hours.  Review of Systems:   +shortness of breath +le edema +hypoxemia - cough, wheezing  Past Medical History:  He,  has a past medical history of Bladder cancer (HCC) (2010), Chronic bronchitis (HCC), Hypertension, Hypoglycemia, On home oxygen therapy, OSA on CPAP, Persistent atrial fibrillation (HCC), Pneumonia, Presence of permanent cardiac pacemaker, and Sensory ataxia.   Surgical History:   Past Surgical History:  Procedure Laterality Date   BACK SURGERY     CARDIOVERSION N/A 04/23/2015   Procedure: CARDIOVERSION;  Surgeon: Pricilla Riffle, MD;  Location: [facility] ENDOSCOPY;  Service: Cardiovascular;  Laterality: N/A;   CARDIOVERSION N/A 07/30/2015   Procedure: CARDIOVERSION;  Surgeon: Jake Bathe, MD;  Location: [facility] ENDOSCOPY;  Service: Cardiovascular;  Laterality: N/A;   EYE SURGERY Bilateral 2009   "to lower pressure; no glaucoma" (06/21/2017)   INSERT / REPLACE / REMOVE PACEMAKER  06/21/2017   LUMBAR DISC SURGERY  1987    PACEMAKER IMPLANT N/A 06/21/2017   Procedure: Pacemaker Implant;  Surgeon: Duke Salvia, MD;  Location: [facility] INVASIVE CV LAB;  Service: Cardiovascular;  Laterality: N/A;   TRANSURETHRAL RESECTION OF BLADDER TUMOR WITH GYRUS (TURBT-GYRUS)  07/10/2009    Cystourethroscopy, gyrus TURBT (transurethral  resection of bladder tumor).Hattie Perch 03/16/2011     Social History:   reports that he quit smoking about 52 years ago. His smoking use included cigarettes. He started smoking about 72 years ago. He has a 10 pack-year smoking history. He has been exposed to tobacco smoke. He has never used smokeless tobacco. He reports that he does not drink alcohol and does not use drugs.   Family History:  His family history includes Dementia in his mother; Heart failure in his father; Hypertension in his father.   Allergies Allergies  Allergen Reactions   Doxycycline Hives   Amiodarone     Caused neuropathy   Carbapenems    Cephalexin Other (See Comments)   Cephalosporins Other (See Comments)   Chlorpheniramine Other (See Comments)    Swells prostate    Ciprofloxacin     Pt can not take with amiodarone    Decongestant [Oxymetazoline]     All decongestant - makes prostate swell   Diltiazem Swelling    Left leg swelling   Flagyl [Metronidazole] Nausea Only    Stomach ache   Hctz [Hydrochlorothiazide]    Levaquin [Levofloxacin In D5w] Other (See Comments)    unknown   Levofloxacin Nausea Only   Lidocaine     Pt unsure of   Penicillins Swelling   Teline [Tetracycline] Swelling    Hospitalization for 2 weeks, prostate swelling   Tetanus Toxoid Swelling    Swells arms, Please pre-medicate with benadryl.   Z-Pak [Azithromycin] Other (See Comments)    Pt can not take with Amiodarone    Sulfonamide Derivatives Rash   Toprol Xl [Metoprolol Tartrate] Itching and Rash    Pt states he tolerates.     Home Medications  Prior to Admission medications   Medication Sig Start Date End Date Taking? Authorizing  Provider  acetaminophen (TYLENOL) 500 MG tablet Take 500 mg by mouth daily.   Yes [provider]  acetaminophen (TYLENOL) 650 MG CR tablet Take 650 mg by mouth daily.   Yes [provider]  Cholecalciferol (VITAMIN D3) 2000 units capsule Take 2,000 Units by mouth 2 (two) times daily.   Yes [provider]  diclofenac Sodium (VOLTAREN) 1 % GEL Apply 2 g topically 2 (two) times daily as needed (Pain).   Yes [provider]  diltiazem (CARDIZEM) 30 MG tablet TAKE 1 TABLET BY MOUTH EVERY 8 HOURS AS NEEDED 07/13/23  Yes Rollene Rotunda, MD  dofetilide (TIKOSYN) 250 MCG capsule Take 1 capsule by mouth twice daily 07/18/23  Yes Hochrein, Fayrene Fearing, MD  escitalopram (LEXAPRO) 10 MG tablet Take 10 mg by mouth at bedtime.   Yes [provider]  furosemide (LASIX) 20 MG tablet Take 1 tablet by mouth twice daily 07/19/23  Yes Rollene Rotunda, MD  metoprolol succinate (TOPROL-XL) 100 MG 24 hr tablet Take 1 tablet by mouth twice daily 02/28/23  Yes Fenton, Clint R, PA  Multiple Vitamins-Minerals (CENTRUM SILVER PO) Take 1 tablet by mouth daily.   Yes [provider]  triamcinolone ointment (KENALOG) 0.1 % Apply 1 Application topically in the morning and at bedtime. 06/08/21  Yes [provider]  vitamin B-12 (CYANOCOBALAMIN) 500 MCG tablet Take 500 mcg by mouth daily.   Yes [provider]  warfarin (COUMADIN) 5 MG tablet TAKE 1/2 TO 1 (ONE-HALF TO ONE) TABLET BY MOUTH ONCE DAILY AS DIRECTED BY  COUMADIN  CLINIC Patient taking differently: Take 2.5 mg by mouth daily. 05/15/23  Yes Rollene Rotunda, MD  OXYGEN Inhale 2 L into the lungs at bedtime. Uses with CPAP machine    [provider]

## 2023-07-24 NOTE — Plan of Care (Signed)

## 2023-07-24 NOTE — Consult Note (Signed)
CARDIOLOGY CONSULT NOTE       Patient ID: Chad [name] MRN: [id number] DOB/AGE: August 24, 1929 89 y.o.  Admit date: 07/22/2023 Referring Physician: Shawnie Pons Primary Physician: [facility] Primary Cardiologist: Hochrein Reason for Consultation: Dyspnea/CHF  Principal Problem:   Acute exacerbation of idiopathic pulmonary fibrosis (HCC) Active Problems:   Essential hypertension   Atrial fibrillation, persistent (HCC)   Acute on chronic diastolic heart failure (HCC)   OSA on CPAP   Localized swelling of left lower extremity   HPI:  89 y.o. admitted with more dyspnea. History of PAF, PPM. Failed flecainide and amiodarone and on Tikosyn. Poor functional status with fall and subdural earlier this year. Chronic dyspnea with ILD Uses oxygen at night and has portable oxygen as needed. Quit smoking year ago Use to work for [employer]. 10 lb weight gain improved with lasix. Rx in ER with iv solu medrol and nebs. CXR with emphysema no CHF CTA with no PE. Progression of ILD trace pleural effusion and small pericardial effusion. No history of CAD. Troponin negative x 2 No fevere WBC elevated 14.4 post steroids Respiratory panel negative Normal myovue 09/22/20. TTE 07/27/20 normal EF poor image quality inpatient repeat pending BNP only 214 ECG with A pacing ICRBBB INR Rx 2.1   ROS All other systems reviewed and negative except as noted above  Past Medical History:  Diagnosis Date   Bladder cancer (HCC) 2010   Chronic bronchitis (HCC)    Hypertension    Hypoglycemia    On home oxygen therapy    "2L w/CPAP at night" (06/21/2017)   OSA on CPAP    "w/2L O2 at night" (06/21/2017)   Persistent atrial fibrillation (HCC)        Pneumonia    "when he was young; again in 2016" (06/21/2017)   Presence of permanent cardiac pacemaker    Sensory ataxia     Family History  Problem Relation Age of Onset   Heart failure Father    Hypertension Father    Dementia Mother     Social  History   Socioeconomic History   Marital status: Widowed    Spouse name: [name]   Number of children: 2   Years of education: 16   Highest education level: Not on file  Occupational History    Employer: RETIRED  Tobacco Use   Smoking status: Former    Current packs/day: 0.00    Average packs/day: 0.5 packs/day for 20.0 years (10.0 ttl pk-yrs)    Types: Cigarettes    Start date: 06/03/1951    Quit date: 06/03/1971    Years since quitting: 52.1    Passive exposure: Past   Smokeless tobacco: Never   Tobacco comments:    Former smoker 08/16/22  Vaping Use   Vaping status: Never Used  Substance and Sexual Activity   Alcohol use: No   Drug use: No   Sexual activity: Not on file  Other Topics Concern   Not on file  Social History Narrative   Patient is married Forensic psychologist) and lives at home with his wife.   Patient has two children.   Patient is retired.   Patient has a college education.   Patient is right-handed.   Patient does not drink any caffeine.   Social Determinants of Health   Financial Resource Strain: Not on file  Food Insecurity: Not on file  Transportation Needs: Not on file  Physical Activity: Not on file  Stress: Not on file  Social Connections: Not  on file  Intimate Partner Violence: Not on file    Past Surgical History:  Procedure Laterality Date   BACK SURGERY     CARDIOVERSION N/A 04/23/2015   Procedure: CARDIOVERSION;  Surgeon: Pricilla Riffle, MD;  Location: [facility] ENDOSCOPY;  Service: Cardiovascular;  Laterality: N/A;   CARDIOVERSION N/A 07/30/2015   Procedure: CARDIOVERSION;  Surgeon: Jake Bathe, MD;  Location: [facility] ENDOSCOPY;  Service: Cardiovascular;  Laterality: N/A;   EYE SURGERY Bilateral 2009   "to lower pressure; no glaucoma" (06/21/2017)   INSERT / REPLACE / REMOVE PACEMAKER  06/21/2017   LUMBAR DISC SURGERY  1987   PACEMAKER IMPLANT N/A 06/21/2017   Procedure: Pacemaker Implant;  Surgeon: Duke Salvia, MD;  Location: [facility] INVASIVE CV LAB;  Service:  Cardiovascular;  Laterality: N/A;   TRANSURETHRAL RESECTION OF BLADDER TUMOR WITH GYRUS (TURBT-GYRUS)  07/10/2009    Cystourethroscopy, gyrus TURBT (transurethral  resection of bladder tumor).Hattie Perch 03/16/2011      Current Facility-Administered Medications:    0.9 %  sodium chloride infusion, 250 mL, Intravenous, PRN, Reva Bores, MD   acetaminophen (TYLENOL) tablet 650 mg, 650 mg, Oral, Q6H PRN, 650 mg at 07/23/23 2200 **OR** acetaminophen (TYLENOL) suppository 650 mg, 650 mg, Rectal, Q6H PRN, Reva Bores, MD   albuterol (PROVENTIL) (2.5 MG/3ML) 0.083% nebulizer solution 2.5 mg, 2.5 mg, Nebulization, Q2H PRN, Reva Bores, MD   cholecalciferol (VITAMIN D3) 25 MCG (1000 UNIT) tablet 2,000 Units, 2,000 Units, Oral, BID, Reva Bores, MD, 2,000 Units at 07/24/23 1022   cyanocobalamin (VITAMIN B12) tablet 500 mcg, 500 mcg, Oral, BID, Reva Bores, MD, 500 mcg at 07/24/23 1021   diltiazem (CARDIZEM) tablet 30 mg, 30 mg, Oral, Q8H, Reva Bores, MD, 30 mg at 07/24/23 0548   dofetilide (TIKOSYN) capsule 250 mcg, 250 mcg, Oral, BID, Reva Bores, MD, 250 mcg at 07/24/23 0547   escitalopram (LEXAPRO) tablet 10 mg, 10 mg, Oral, QHS, Reva Bores, MD, 10 mg at 07/23/23 2200   fentaNYL (SUBLIMAZE) injection 12.5-50 mcg, 12.5-50 mcg, Intravenous, Q2H PRN, Reva Bores, MD   furosemide (LASIX) injection 40 mg, 40 mg, Intravenous, BID, Reva Bores, MD, 40 mg at 07/24/23 1028   guaiFENesin (MUCINEX) 12 hr tablet 600 mg, 600 mg, Oral, BID PRN, Reva Bores, MD   irbesartan (AVAPRO) tablet 75 mg, 75 mg, Oral, Daily, Reva Bores, MD, 75 mg at 07/24/23 1021   metoprolol succinate (TOPROL-XL) 24 hr tablet 100 mg, 100 mg, Oral, BID, Reva Bores, MD, 100 mg at 07/24/23 1022   multivitamin with minerals tablet 1 tablet, 1 tablet, Oral, Daily, Reva Bores, MD, 1 tablet at 07/24/23 1022   ondansetron (ZOFRAN) tablet 4 mg, 4 mg, Oral, Q6H PRN **OR** ondansetron (ZOFRAN) injection 4 mg, 4  mg, Intravenous, Q6H PRN, Reva Bores, MD   polyethylene glycol (MIRALAX / GLYCOLAX) packet 17 g, 17 g, Oral, Daily PRN, Reva Bores, MD   potassium chloride SA (KLOR-CON M) CR tablet 20 mEq, 20 mEq, Oral, BID, Reva Bores, MD, 20 mEq at 07/24/23 1021   sodium chloride flush (NS) 0.9 % injection 3 mL, 3 mL, Intravenous, Q12H, Reva Bores, MD, 3 mL at 07/24/23 1029   sodium chloride flush (NS) 0.9 % injection 3 mL, 3 mL, Intravenous, PRN, Reva Bores, MD   warfarin (COUMADIN) tablet 2.5 mg, 2.5 mg, Oral, q1600, Daylene Posey, [facility]   Warfarin - Pharmacist Dosing Inpatient, , Does not  apply, q1600, Reva Bores, MD  cholecalciferol  2,000 Units Oral BID   cyanocobalamin  500 mcg Oral BID   diltiazem  30 mg Oral Q8H   dofetilide  250 mcg Oral BID   escitalopram  10 mg Oral QHS   furosemide  40 mg Intravenous BID   irbesartan  75 mg Oral Daily   metoprolol succinate  100 mg Oral BID   multivitamin with minerals  1 tablet Oral Daily   potassium chloride  20 mEq Oral BID   sodium chloride flush  3 mL Intravenous Q12H   warfarin  2.5 mg Oral q1600   Warfarin - Pharmacist Dosing Inpatient   Does not apply q1600    sodium chloride      Physical Exam: Blood pressure 117/67, pulse 62, temperature (!) 97.2 F (36.2 C), temperature source Oral, resp. rate 18, weight 106.8 kg, SpO2 93%.     Chronically ill male Mild tachypnea JVP not elevated No murmur Inspiratory crackles mid/base Trace edema Abdomen benign   Labs:   Lab Results  Component Value Date   WBC 14.4 (H) 07/24/2023   HGB 15.7 07/24/2023   HCT 48.6 07/24/2023   MCV 92.4 07/24/2023   PLT 196 07/24/2023    Recent Labs  Lab 07/24/23 0321  NA 136  K 4.3  CL 96*  CO2 33*  BUN 23  CREATININE 1.62*  CALCIUM 9.5  PROT 6.2*  BILITOT 0.6  ALKPHOS 71  ALT 14  AST 21  GLUCOSE 125*   Lab Results  Component Value Date   TROPONINI <0.03 01/18/2019    Lab Results  Component Value Date   CHOL 179  06/10/2010   Lab Results  Component Value Date   HDL 46 06/10/2010   Lab Results  Component Value Date   LDLCALC 118 06/10/2010   Lab Results  Component Value Date   TRIG 75 06/10/2010   No results found for: "CHOLHDL" No results found for: "LDLDIRECT"    Radiology: VAS [place] LOWER EXTREMITY VENOUS (DVT)  Result Date: 07/23/2023  Lower Venous DVT Study Patient Name:  DONEL MORDECAI  Date of Exam:   07/23/2023 Medical Rec #: [id number]        Accession #:    [id number] Date of Birth: 02-28-1929         Patient Gender: M Patient Age:   42 years Exam Location:  [facility] Procedure:      VAS [place] LOWER EXTREMITY VENOUS (DVT) Referring Phys: Kenney Houseman PRATT --------------------------------------------------------------------------------  Indications: Swelling LT>RT in setting of acute on chronic CHF.  Anticoagulation: Coumadin. Comparison Study: 08-25-2022 Prior RT lower extremity venous negative for DVT. Performing Technologist: Jean Rosenthal RDMS, RVT  Examination Guidelines: A complete evaluation includes B-mode imaging, spectral Doppler, color Doppler, and power Doppler as needed of all accessible portions of each vessel. Bilateral testing is considered an integral part of a complete examination. Limited examinations for reoccurring indications may be performed as noted. The reflux portion of the exam is performed with the patient in reverse Trendelenburg.  +-----+---------------+---------+-----------+----------+--------------+ RIGHTCompressibilityPhasicitySpontaneityPropertiesThrombus Aging +-----+---------------+---------+-----------+----------+--------------+ CFV  Full           Yes      Yes                                 +-----+---------------+---------+-----------+----------+--------------+   +---------+---------------+---------+-----------+----------+--------------+ LEFT     CompressibilityPhasicitySpontaneityPropertiesThrombus Aging  +---------+---------------+---------+-----------+----------+--------------+ CFV      Full  Yes      Yes                                 +---------+---------------+---------+-----------+----------+--------------+ SFJ      Full                                                        +---------+---------------+---------+-----------+----------+--------------+ FV Prox  Full                                                        +---------+---------------+---------+-----------+----------+--------------+ FV Mid   Full                                                        +---------+---------------+---------+-----------+----------+--------------+ FV DistalFull                                                        +---------+---------------+---------+-----------+----------+--------------+ PFV      Full                                                        +---------+---------------+---------+-----------+----------+--------------+ POP      Full           Yes      Yes                                 +---------+---------------+---------+-----------+----------+--------------+ PTV      Full                                                        +---------+---------------+---------+-----------+----------+--------------+ PERO     Full                                                        +---------+---------------+---------+-----------+----------+--------------+     Summary: RIGHT: - No evidence of common femoral vein obstruction.  LEFT: - There is no evidence of deep vein thrombosis in the lower extremity.  - No cystic structure found in the popliteal fossa.  *See table(s) above for measurements and observations.    Preliminary    CT Angio Chest PE W and/or Wo Contrast  Result Date: 07/23/2023 CLINICAL DATA:  Syncope/presyncope. Cerebrovascular cause suspected.  Evaluate shortness of breath. Also states his ankles are swelling. EXAM: CT ANGIOGRAPHY CHEST  WITH CONTRAST TECHNIQUE: Multidetector CT imaging of the chest was performed using the standard protocol during bolus administration of intravenous contrast. Multiplanar CT image reconstructions and MIPs were obtained to evaluate the vascular anatomy. RADIATION DOSE REDUCTION: This exam was performed according to the departmental dose-optimization program which includes automated exposure control, adjustment of the mA and/or kV according to patient size and/or use of iterative reconstruction technique. CONTRAST:  75mL OMNIPAQUE IOHEXOL 350 MG/ML SOLN COMPARISON:  Chest, abdomen and pelvis CT with contrast 08/08/2022, chest CT without contrast 06/13/2022. FINDINGS: Cardiovascular: Interval increased prominence of the pulmonary trunk measuring 3.3 cm indicating arterial hypertension. No arterial embolism is seen. Small circumferential pericardial effusion is slightly increased in the interval. There is artifact from a left chest pacemaker and leads in the right atrium and ventricle. The heart is slightly enlarged. There are calcifications in the coronary arteries, aorta and great vessels. No aortic aneurysm is seen. Luminal opacification is insufficient to assess for dissection. The pulmonary veins are nondistended. Mediastinum/Nodes: No enlarged mediastinal, hilar, or axillary lymph nodes. Thyroid gland, trachea, and esophagus demonstrate no significant findings. Lungs/Pleura: There is diffuse bronchial thickening again noted, with subpleural reticulation and scattered honeycombing, with patchy areas of air trapping in the upper zones and chronic faint ground-glass disease in the lower zones. The findings are again greater on the right than the left without significant interval progression. There are scattered linear scar-like opacities in the bases. There is no typical zonal predilection to this and this is not associated with central bronchiectasis. There is no active infiltrate, no nodule is seen. There are trace  pleural effusions. No pneumothorax. Upper Abdomen: No acute abnormality. Musculoskeletal: Osteopenia and degenerative changes and extensive bridging enthesopathy of the thoracic spine. No acute or other significant osseous abnormality. There is a 4.2 cm sebaceous cyst left of the midline in the upper chest wall, previously 3.6 cm. No suspicious chest wall abnormality. Review of the MIP images confirms the above findings. IMPRESSION: 1. No evidence of arterial embolus. 2. Interval increased prominence of the pulmonary trunk. 3. Cardiomegaly with small circumferential pericardial effusion slightly increased in the interval. No venous dilatation or overt edema. 4. Aortic and coronary artery atherosclerosis. 5. Chronic interstitial lung disease without significant interval progression. 6. Trace pleural effusions, new in the interval. 7. 4.2 cm sebaceous cyst left of the midline in the upper chest wall, previously 3.6 cm. 8. Osteopenia and degenerative change with extensive spinal bridging enthesopathy. Electronically Signed   By: Almira Bar M.D.   On: 07/23/2023 05:16   DG Chest 2 View  Result Date: 07/22/2023 CLINICAL DATA:  COPD and shortness of breath.  Back pain. EXAM: CHEST - 2 VIEW COMPARISON:  Chest radiograph dated 12/04/2022 and CT dated 08/08/2022. FINDINGS: Background of emphysema and chronic interstitial coarsening. No focal consolidation, pleural effusion, or pneumothorax. Stable cardiac silhouette. Left pectoral pacemaker device. No acute osseous pathology. Degenerative changes of the spine. Old lower thoracic compression fracture with anterior wedging. IMPRESSION: 1. No active cardiopulmonary disease. 2. Emphysema. Electronically Signed   By: Elgie Collard M.D.   On: 07/22/2023 22:13    EKG: A pacing    ASSESSMENT AND PLAN:   Dyspnea:  seems to be more progression of ILD. May have some diastolic dysfunction but LE not much edema BNP minimally elevated and CXR no real CHF.  Continue iv  lasix 40 bid can likely change to PO in  am. Update TTE although images have been sub optimal in past.  ILD: progressive on CT nebs/steroids pulmonary consult Keep on dry side Oxygen as needed to keep sats > 90% At home uses CPAP and oxygen at night  PAF:  NSR continue Tikosyn and coumadin INR Rx Mg/K are in normal range  PPM:  normal function A pacing   Signed: Charlton Haws 07/24/2023, 10:44 AM

## 2023-07-24 NOTE — Progress Notes (Signed)
ANTICOAGULATION CONSULT NOTE - Follow Up Consult  Pharmacy Consult for Warfarin and Tikosyn monitoring Indication: atrial fibrillation  Allergies  Allergen Reactions   Doxycycline Hives   Amiodarone     Caused neuropathy   Carbapenems    Cephalexin Other (See Comments)   Cephalosporins Other (See Comments)   Chlorpheniramine Other (See Comments)    Swells prostate    Ciprofloxacin     Pt can not take with amiodarone    Decongestant [Oxymetazoline]     All decongestant - makes prostate swell   Diltiazem Swelling    Left leg swelling   Flagyl [Metronidazole] Nausea Only    Stomach ache   Hctz [Hydrochlorothiazide]    Levaquin [Levofloxacin In D5w] Other (See Comments)    unknown   Levofloxacin Nausea Only   Lidocaine     Pt unsure of   Penicillins Swelling   Teline [Tetracycline] Swelling    Hospitalization for 2 weeks, prostate swelling   Tetanus Toxoid Swelling    Swells arms, Please pre-medicate with benadryl.   Z-Pak [Azithromycin] Other (See Comments)    Pt can not take with Amiodarone    Sulfonamide Derivatives Rash   Toprol Xl [Metoprolol Tartrate] Itching and Rash    Pt states he tolerates.    Patient Measurements: Height: 5\' 9"  (175.3 cm) Weight: 106.8 kg (235 lb 7.2 oz) IBW/kg (Calculated) : 70.7  Vital Signs: Temp: 97.2 F (36.2 C) (09/09 0746) Temp Source: Oral (09/09 0746) BP: 117/67 (09/09 0746) Pulse Rate: 62 (09/09 0746)  Labs: Recent Labs    07/22/23 2123 07/23/23 0247 07/23/23 0821 07/23/23 1039 07/23/23 1040 07/24/23 0321  HGB 15.2 15.6  --   --   --  15.7  HCT 48.3 46.0  --   --   --  48.6  PLT 177  --   --   --   --  196  LABPROT 23.6*  --   --   --   --  21.6*  INR 2.1*  --   --   --   --  1.9*  CREATININE 1.46*  --   --  1.30*  --  1.62*  TROPONINIHS  --   --  9  --  13  --     Estimated Creatinine Clearance: 35 mL/min (A) (by C-G formula based on SCr of 1.62 mg/dL (H)).  Assessment: Chad [name] presenting with SOB, hx of Afib  on warfarin PTA with INR on admission therapeutic at 2.1, last dose taken 9/6 per pt report.  INR 1.9 today, just below target target range.   PTA warfarin dosing: 2.5 mg daily.  Tikosyn:  Creatinine trended up with diuresis.  K+ and Mag at goal.  Goal of Therapy:  INR 2-3 Monitor platelets by anticoagulation protocol: Yes Tikosyn: K+ >/= 4.0, Mag >/= 1.8   Plan:  Warfarin 3 mg x 1 today.   Resume warfarin 2.5 mg daily on 9/10 unless INR trends down. Daily PT/INR for now.  Tikosyn 250 mcg PO BID. Monitor renal function, electrolytes, QTc.  Dennie Fetters, [place] 07/24/2023,12:43 PM

## 2023-07-25 ENCOUNTER — Ambulatory Visit: Payer: Medicare Other | Admitting: Emergency Medicine

## 2023-07-25 ENCOUNTER — Encounter: Payer: Self-pay | Admitting: Emergency Medicine

## 2023-07-25 ENCOUNTER — Other Ambulatory Visit (HOSPITAL_COMMUNITY): Payer: Self-pay

## 2023-07-25 VITALS — BP 128/70 | HR 68 | Ht 69.0 in | Wt 234.8 lb

## 2023-07-25 DIAGNOSIS — I5033 Acute on chronic diastolic (congestive) heart failure: Secondary | ICD-10-CM | POA: Diagnosis not present

## 2023-07-25 DIAGNOSIS — J849 Interstitial pulmonary disease, unspecified: Secondary | ICD-10-CM

## 2023-07-25 DIAGNOSIS — G4733 Obstructive sleep apnea (adult) (pediatric): Secondary | ICD-10-CM

## 2023-07-25 DIAGNOSIS — J84112 Idiopathic pulmonary fibrosis: Secondary | ICD-10-CM | POA: Diagnosis not present

## 2023-07-25 DIAGNOSIS — G4734 Idiopathic sleep related nonobstructive alveolar hypoventilation: Secondary | ICD-10-CM

## 2023-07-25 LAB — COMPREHENSIVE METABOLIC PANEL
ALT: 15 U/L (ref 0–44)
AST: 23 U/L (ref 15–41)
Albumin: 3.2 g/dL — ABNORMAL LOW (ref 3.5–5.0)
Alkaline Phosphatase: 68 U/L (ref 38–126)
Anion gap: 10 (ref 5–15)
BUN: 32 mg/dL — ABNORMAL HIGH (ref 8–23)
CO2: 31 mmol/L (ref 22–32)
Calcium: 9.4 mg/dL (ref 8.9–10.3)
Chloride: 95 mmol/L — ABNORMAL LOW (ref 98–111)
Creatinine, Ser: 1.34 mg/dL — ABNORMAL HIGH (ref 0.61–1.24)
GFR, Estimated: 50 mL/min — ABNORMAL LOW (ref >60)
Glucose, Bld: 161 mg/dL — ABNORMAL HIGH (ref 70–99)
Potassium: 4.2 mmol/L (ref 3.5–5.1)
Sodium: 136 mmol/L (ref 135–145)
Total Bilirubin: 0.5 mg/dL (ref 0.3–1.2)
Total Protein: 6 g/dL — ABNORMAL LOW (ref 6.5–8.1)

## 2023-07-25 LAB — CBC
HCT: 48.8 % (ref 39.0–52.0)
Hemoglobin: 15.4 g/dL (ref 13.0–17.0)
MCH: 28.9 pg (ref 26.0–34.0)
MCHC: 31.6 g/dL (ref 30.0–36.0)
MCV: 91.7 fL (ref 80.0–100.0)
Platelets: 188 10*3/uL (ref 150–400)
RBC: 5.32 MIL/uL (ref 4.22–5.81)
RDW: 14.4 % (ref 11.5–15.5)
WBC: 13.7 10*3/uL — ABNORMAL HIGH (ref 4.0–10.5)
nRBC: 0 % (ref 0.0–0.2)

## 2023-07-25 LAB — PROTIME-INR
INR: 2.1 — ABNORMAL HIGH (ref 0.8–1.2)
Prothrombin Time: 23.5 s — ABNORMAL HIGH (ref 11.4–15.2)

## 2023-07-25 LAB — MAGNESIUM: Magnesium: 2.3 mg/dL (ref 1.7–2.4)

## 2023-07-25 NOTE — Assessment & Plan Note (Signed)
Decompensated acute on chronic diastolic CHF.  He is much better after aggressive diuresis on the recent admission.  I think the most important thing we can do to try and help with this is to make sure he uses his CPAP and his oxygen reliably.  I did explain to him in detail that desaturations or uncontrolled CPAP will make his heart failure harder to manage.

## 2023-07-25 NOTE — Progress Notes (Signed)
Pharmacy: Dofetilide (Tikosyn) - Follow Up Assessment and Electrolyte Replacement  Pharmacy consulted to assist in monitoring and replacing electrolytes in this 89 y.o. male admitted on 07/22/2023 undergoing dofetilide re-initiation. First dofetilide dose: 9/8 @ 1000  Labs:    Component Value Date/Time   K 4.2 07/25/2023 0338   MG 2.3 07/25/2023 9629     Plan: Potassium: K >/= 4: No additional supplementation needed  Magnesium: Mg > 2: No additional supplementation needed   As patient has required on average 40 mEq of potassium replacement every day, recommend discharging patient with prescription for:  Potassium chloride 20 mEq  twice daily  Thank you for allowing pharmacy to participate in this patient's care   Reece Leader, Colon Flattery, [facility] Clinical Pharmacist  07/25/2023 11:43 AM   [facility] pharmacy phone numbers are listed on [email or web address]

## 2023-07-25 NOTE — Assessment & Plan Note (Signed)
Discussed with him the importance of wearing his O2 reliably with exertion.

## 2023-07-25 NOTE — TOC Transition Note (Signed)
Transition of Care (TOC) - CM/SW Discharge Note Donn Pierini RN, BSN Transitions of Care Unit 4E- RN Case Manager See Treatment Team for direct phone #   Patient Details  Name: Chad [name] MRN: [id number] Date of Birth: 27-May-1929  Transition of Care [facility]) CM/SW Contact:  Darrold Span, RN Phone Number: 07/25/2023, 2:06 PM   Clinical Narrative:    Pt stable for transition home today, PT recommending HHPT (safety eval) orders have been placed.   CM spoke with pt and daughter at bedside- pt agreeable to a "few" visits. List provided for [facility] choice Per CMS guidelines from [employer] website with star ratings (copy placed in shadow chart)- per pt and daughter pt has used [facility] several years ago- name of PT person that came was [place]- they would like to see if she is still working with [employer]-   Address, phone # and PCP all confirmed- daughter # also confirmed as she request to be primary contact.   Pt has needed DME at home, no new needs noted, daughter to transport home.   Call made to [facility]- liaison- Lynette - per [facility] they are currently unable to staff for PT in pt's area and unable to accept referral at this time.  CM notified pt and daughter and asked for alternate choice- per daughter they would like to try [facility].   Call made to [facility] liaison - Tresa Endo- referral has been accepted- and anticipate start of care visit for tomorrow. Call made to daughter and VM left to update.   No further TOC needs noted.    Final next level of care: Home w Home Health Services Barriers to Discharge: No Barriers Identified   Patient Goals and CMS Choice CMS Medicare.gov Compare Post Acute Care list provided to:: Patient Choice offered to / list presented to : Patient, Adult Children  Discharge Placement                         Discharge Plan and Services Additional resources added to the After Visit Summary for     Discharge Planning  Services: CM Consult Post Acute Care Choice: Home Health          DME Arranged: N/A DME Agency: NA       HH Arranged: PT HH Agency: [facility] Date HH Agency Contacted: 07/25/23 Time HH Agency Contacted: 1247 Representative spoke with at [facility] Agency: Tresa Endo  Social Determinants of Health (SDOH) Interventions SDOH Screenings   Tobacco Use: Medium Risk (07/22/2023)     Readmission Risk Interventions    07/25/2023   12:47 PM  Readmission Risk Prevention Plan  Transportation Screening Complete  Home Care Screening Complete  Medication Review (RN CM) Complete

## 2023-07-25 NOTE — Progress Notes (Signed)
Dr. Eden Emms relayed dc recs to Dr. Hanley Ben. Post-hospital f/u arranged 9/20 with APP and put on AVS; will keep 09/2023 appt with Dr. Antoine Poche for now.

## 2023-07-25 NOTE — Discharge Summary (Addendum)
Physician Discharge Summary  WENCES DELPOZO ONG:[id number] DOB: 1929-08-19 DOA: 07/22/2023  PCP: [facility]  Admit date: 07/22/2023 Discharge date: 07/25/2023  Admitted From: Home Disposition: Home  Recommendations for Outpatient Follow-up:  Follow up with PCP in 1 week with repeat CBC/BMP Outpatient follow-up with cardiology and pulmonary.  Recommend outpatient evaluation and follow-up by palliative care Follow up in ED if symptoms worsen or new appear   Home Health: Home Health PT Equipment/Devices: None  Discharge Condition: Guarded to poor CODE STATUS: Full Diet recommendation: Heart healthy  Brief/Interim Summary: 89 y.o. male with medical history significant of  idiopathic pulmonary fibrosis, chronic respiratory failure with hypoxia (supposed to be wearing supplemental oxygen but only wears as needed), A-fib on Coumadin, HTN, HFpEF, history of V. tach, OSA on CPAP presented with worsening shortness of breath along with weight gain.  On presentation, he required 2 L oxygen via nasal cannula.  CTA chest showed no PE and showed chronic interstitial lung disease without significant interval progression.  He was started on IV Solu-Medrol and Lasix.  Cardiology, pulmonary and palliative care were consulted.  Pulmonary discontinued IV Solu-Medrol and recommended outpatient follow-up with pulmonary.  Patient feels much better after IV diuresis.  He feels okay to go home today.  Cardiology has cleared the patient for discharge on Lasix 20 mg twice a day.  Outpatient follow-up with cardiology.  Palliative care consultation pending: This can happen as an outpatient.  Discharge patient home today.  Outpatient follow-up with PCP, cardiology, pulmonary, palliative care.  Discharge Diagnoses:   Possible acute on chronic diastolic heart failure -Presented with worsening shortness of breath and weight gain. -Currently on Lasix 40 mg IV twice daily.  Also on metoprolol succinate.   Continue diet and fluid restriction.  2D echo showed grossly normal EF; left ventricular diastolic parameters are indeterminate. -Cardiology evaluation appreciated. -Cardiology has cleared the patient for discharge on Lasix 20 mg twice a day.  Outpatient follow-up with cardiology.  -Discharge patient home today.   Possible acute exacerbation of idiopathic pulmonary fibrosis Chronic respiratory failure with hypoxia -Received IV Solu-Medrol on presentation.  pulmonary evaluation appreciated: Solu-Medrol discontinued by pulmonary.  Pulmonary recommended outpatient follow-up with pulmonary: Patient has appointment with Dr. Delton Coombes today: He needs to keep this appointment. -He is supposed to use supplemental oxygen at home but uses it only as needed.  He was counseled regarding compliance with use of oxygen by myself and pulmonary team.   Left lower extremity swelling -Questionable cause.  Lower extremity duplex negative for DVT   Persistent A-fib -Rate controlled on Tikosyn.  Continue Tikosyn.  Continue  metoprolol succinate.  Continue as needed Cardizem. Continue Coumadin.  Follow INR as an outpatient.  Follow-up up with cardiology   history of permanent pacemaker -Cardiology following.  CKD stage IIIa -Creatinine baseline is around 1.2-1.4.  Creatinine 1.34 today.  Outpatient follow-up.  Essential hypertension -Continue Lasix and metoprolol succinate   Leukocytosis -Mild.  Possibly reactive from steroid use.   OSA -Continue CPAP at night   Goals of care -Currently listed as full code.  Palliative care consultation pending.  Overall prognosis is guarded to poor.  Outpatient evaluation and follow-up by palliative care.  Discharge Instructions  Discharge Instructions     Amb Referral to Palliative Care   Complete by: As directed    Ambulatory referral to Cardiology   Complete by: As directed    Ambulatory referral to Pulmonology   Complete by: As directed    [facility]  followup    Reason for referral: Other   Diet - low sodium heart healthy   Complete by: As directed    Increase activity slowly   Complete by: As directed       Allergies as of 07/25/2023       Reactions   Doxycycline Hives   Amiodarone    Caused neuropathy   Carbapenems    Cephalexin Other (See Comments)   Cephalosporins Other (See Comments)   Chlorpheniramine Other (See Comments)   Swells prostate   Ciprofloxacin    Pt can not take with amiodarone    Decongestant [oxymetazoline]    All decongestant - makes prostate swell   Diltiazem Swelling   Left leg swelling   Flagyl [metronidazole] Nausea Only   Stomach ache   Hctz [hydrochlorothiazide]    Levaquin [levofloxacin In D5w] Other (See Comments)   unknown   Levofloxacin Nausea Only   Lidocaine    Pt unsure of   Penicillins Swelling   Teline [tetracycline] Swelling   Hospitalization for 2 weeks, prostate swelling   Tetanus Toxoid Swelling   Swells arms, Please pre-medicate with benadryl.   Z-pak [azithromycin] Other (See Comments)   Pt can not take with Amiodarone    Sulfonamide Derivatives Rash   Toprol Xl [metoprolol Tartrate] Itching, Rash   Pt states he tolerates.        Medication List     TAKE these medications    acetaminophen 650 MG CR tablet Commonly known as: TYLENOL Take 650 mg by mouth daily. What changed: Another medication with the same name was removed. Continue taking this medication, and follow the directions you see here.   CENTRUM SILVER PO Take 1 tablet by mouth daily.   diclofenac Sodium 1 % Gel Commonly known as: VOLTAREN Apply 2 g topically 2 (two) times daily as needed (Pain).   diltiazem 30 MG tablet Commonly known as: CARDIZEM TAKE 1 TABLET BY MOUTH EVERY 8 HOURS AS NEEDED   dofetilide 250 MCG capsule Commonly known as: TIKOSYN Take 1 capsule by mouth twice daily   escitalopram 10 MG tablet Commonly known as: LEXAPRO Take 10 mg by mouth at bedtime.   furosemide 20 MG  tablet Commonly known as: LASIX Take 1 tablet by mouth twice daily   metoprolol succinate 100 MG 24 hr tablet Commonly known as: TOPROL-XL Take 1 tablet by mouth twice daily   OXYGEN Inhale 2 L into the lungs at bedtime. Uses with CPAP machine   triamcinolone ointment 0.1 % Commonly known as: KENALOG Apply 1 Application topically in the morning and at bedtime.   vitamin B-12 500 MCG tablet Commonly known as: CYANOCOBALAMIN Take 500 mcg by mouth daily.   Vitamin D3 50 MCG (2000 UT) capsule Take 2,000 Units by mouth 2 (two) times daily.   warfarin 5 MG tablet Commonly known as: COUMADIN Take as directed. If you are unsure how to take this medication, talk to your nurse or doctor. Original instructions: TAKE 1/2 TO 1 (ONE-HALF TO ONE) TABLET BY MOUTH ONCE DAILY AS DIRECTED BY  COUMADIN  CLINIC What changed: See the new instructions.        Follow-up Information     [facility], [facility]. Schedule an appointment as soon as possible for a visit in 1 week(s).   Specialty: Family Medicine Why: with repeat bmp Contact information: [address] [place] [address] [phone]  Allergies  Allergen Reactions   Doxycycline Hives   Amiodarone     Caused neuropathy   Carbapenems    Cephalexin Other (See Comments)   Cephalosporins Other (See Comments)   Chlorpheniramine Other (See Comments)    Swells prostate    Ciprofloxacin     Pt can not take with amiodarone    Decongestant [Oxymetazoline]     All decongestant - makes prostate swell   Diltiazem Swelling    Left leg swelling   Flagyl [Metronidazole] Nausea Only    Stomach ache   Hctz [Hydrochlorothiazide]    Levaquin [Levofloxacin In D5w] Other (See Comments)    unknown   Levofloxacin Nausea Only   Lidocaine     Pt unsure of   Penicillins Swelling   Teline [Tetracycline] Swelling    Hospitalization for 2 weeks, prostate swelling   Tetanus Toxoid Swelling     Swells arms, Please pre-medicate with benadryl.   Z-Pak [Azithromycin] Other (See Comments)    Pt can not take with Amiodarone    Sulfonamide Derivatives Rash   Toprol Xl [Metoprolol Tartrate] Itching and Rash    Pt states he tolerates.    Consultations: Cardiology/pulmonary/palliative care   Procedures/Studies: ECHOCARDIOGRAM COMPLETE  Result Date: 07/24/2023    ECHOCARDIOGRAM REPORT   Patient Name:   ASAHI SOYARS Date of Exam: 07/24/2023 Medical Rec #:  [id number]       Height:       69.0 in Accession #:    [id number]      Weight:       235.4 lb Date of Birth:  March 01, 1929        BSA:          2.214 m Patient Age:    89 years        BP:           117/67 mmHg Patient Gender: M               HR:           69 bpm. Exam Location:  Inpatient Procedure: 2D Echo, Cardiac Doppler, Color Doppler and Intracardiac            Opacification Agent Indications:     CHF- Acute Systolic I50.21  History:         Patient has prior history of Echocardiogram examinations, most                  recent 07/27/2020. Pacemaker; Risk Factors:Hypertension.  Sonographer:     Harriette Bouillon RDCS Referring Phys:  [id number] Shelbie Proctor PRATT Diagnosing Phys: [place] IMPRESSIONS  1. Poor windows. very limited study. The EF is grossly normal.. Left ventricular diastolic parameters are indeterminate.  2. Right ventricular systolic function was not well visualized. The right ventricular size is not well visualized. Tricuspid regurgitation signal is inadequate for assessing PA pressure.  3. The mitral valve was not well visualized. No evidence of mitral valve regurgitation.  4. The aortic valve was not well visualized. Conclusion(s)/Recommendation(s): Consider repeat TTE as an outpatient due to limited windows. FINDINGS  Left Ventricle: Poor windows. very limited study. The EF is grossly normal. Definity contrast agent was given IV to delineate the left ventricular endocardial borders. Left ventricular diastolic parameters are indeterminate.  Right Ventricle: The right ventricular size is not well visualized. Right ventricular systolic function was not well visualized. Tricuspid regurgitation signal is inadequate for assessing PA pressure. Left Atrium: Left atrial size was not well visualized. Right Atrium:  Right atrial size was not well visualized. Pericardium: The pericardium was not well visualized. Mitral Valve: The mitral valve was not well visualized. No evidence of mitral valve regurgitation. Tricuspid Valve: Tricuspid valve regurgitation is not demonstrated. Aortic Valve: The aortic valve was not well visualized. Pulmonic Valve: The pulmonic valve was not well visualized. Pulmonic valve regurgitation is not visualized. Aorta: The aortic root and ascending aorta are structurally normal, with no evidence of dilitation. IAS/Shunts: The interatrial septum was not well visualized.  LEFT VENTRICLE PLAX 2D LVIDd:         5.50 cm LVIDs:         4.00 cm LV PW:         0.80 cm LV IVS:        0.80 cm LVOT diam:     2.00 cm LV SV:         34 LV SV Index:   15 LVOT Area:     3.14 cm  IVC IVC diam: 3.00 cm LEFT ATRIUM         Index LA diam:    4.50 cm 2.03 cm/m  AORTIC VALVE LVOT Vmax:   53.60 cm/s LVOT Vmean:  41.700 cm/s LVOT VTI:    0.108 m  AORTA Ao Root diam: 3.50 cm Ao Asc diam:  3.00 cm MITRAL VALVE MV Area (PHT): 2.86 cm     SHUNTS MV E velocity: 77.80 cm/s   Systemic VTI:  0.11 m MV A velocity: 106.00 cm/s  Systemic Diam: 2.00 cm MV E/A ratio:  0.73 [place] signed by Carolan Clines Signature Date/Time: 07/24/2023/3:09:39 PM    Final (Updated)    VAS [place] LOWER EXTREMITY VENOUS (DVT)  Result Date: 07/24/2023  Lower Venous DVT Study Patient Name:  HARGIS NOGUERA  Date of Exam:   07/23/2023 Medical Rec #: [id number]        Accession #:    [id number] Date of Birth: 1929/07/07         Patient Gender: M Patient Age:   32 years Exam Location:  [facility] Procedure:      VAS [place] LOWER EXTREMITY VENOUS (DVT) Referring Phys: Kenney Houseman PRATT  --------------------------------------------------------------------------------  Indications: Swelling LT>RT in setting of acute on chronic CHF.  Anticoagulation: Coumadin. Comparison Study: 08-25-2022 Prior RT lower extremity venous negative for DVT. Performing Technologist: Jean Rosenthal RDMS, RVT  Examination Guidelines: A complete evaluation includes B-mode imaging, spectral Doppler, color Doppler, and power Doppler as needed of all accessible portions of each vessel. Bilateral testing is considered an integral part of a complete examination. Limited examinations for reoccurring indications may be performed as noted. The reflux portion of the exam is performed with the patient in reverse Trendelenburg.  +-----+---------------+---------+-----------+----------+--------------+ RIGHTCompressibilityPhasicitySpontaneityPropertiesThrombus Aging +-----+---------------+---------+-----------+----------+--------------+ CFV  Full           Yes      Yes                                 +-----+---------------+---------+-----------+----------+--------------+   +---------+---------------+---------+-----------+----------+--------------+ LEFT     CompressibilityPhasicitySpontaneityPropertiesThrombus Aging +---------+---------------+---------+-----------+----------+--------------+ CFV      Full           Yes      Yes                                 +---------+---------------+---------+-----------+----------+--------------+ SFJ      Full                                                        +---------+---------------+---------+-----------+----------+--------------+  FV Prox  Full                                                        +---------+---------------+---------+-----------+----------+--------------+ FV Mid   Full                                                        +---------+---------------+---------+-----------+----------+--------------+ FV DistalFull                                                         +---------+---------------+---------+-----------+----------+--------------+ PFV      Full                                                        +---------+---------------+---------+-----------+----------+--------------+ POP      Full           Yes      Yes                                 +---------+---------------+---------+-----------+----------+--------------+ PTV      Full                                                        +---------+---------------+---------+-----------+----------+--------------+ PERO     Full                                                        +---------+---------------+---------+-----------+----------+--------------+     Summary: RIGHT: - No evidence of common femoral vein obstruction.  LEFT: - There is no evidence of deep vein thrombosis in the lower extremity.  - No cystic structure found in the popliteal fossa.  *See table(s) above for measurements and observations. Electronically signed by Coral Else MD on 07/24/2023 at 12:05:16 PM.    Final    CT Angio Chest PE W and/or Wo Contrast  Result Date: 07/23/2023 CLINICAL DATA:  Syncope/presyncope. Cerebrovascular cause suspected. Evaluate shortness of breath. Also states his ankles are swelling. EXAM: CT ANGIOGRAPHY CHEST WITH CONTRAST TECHNIQUE: Multidetector CT imaging of the chest was performed using the standard protocol during bolus administration of intravenous contrast. Multiplanar CT image reconstructions and MIPs were obtained to evaluate the vascular anatomy. RADIATION DOSE REDUCTION: This exam was performed according to the departmental dose-optimization program which includes automated exposure control, adjustment of the mA and/or kV according to patient size and/or use of iterative reconstruction technique. CONTRAST:  75mL OMNIPAQUE IOHEXOL 350 MG/ML SOLN COMPARISON:  Chest, abdomen and pelvis CT with contrast 08/08/2022, chest CT without contrast  06/13/2022. FINDINGS: Cardiovascular: Interval increased prominence of the pulmonary trunk measuring 3.3 cm indicating arterial hypertension. No arterial embolism is seen. Small circumferential pericardial effusion is slightly increased in the interval. There is artifact from a left chest pacemaker and leads in the right atrium and ventricle. The heart is slightly enlarged. There are calcifications in the coronary arteries, aorta and great vessels. No aortic aneurysm is seen. Luminal opacification is insufficient to assess for dissection. The pulmonary veins are nondistended. Mediastinum/Nodes: No enlarged mediastinal, hilar, or axillary lymph nodes. Thyroid gland, trachea, and esophagus demonstrate no significant findings. Lungs/Pleura: There is diffuse bronchial thickening again noted, with subpleural reticulation and scattered honeycombing, with patchy areas of air trapping in the upper zones and chronic faint ground-glass disease in the lower zones. The findings are again greater on the right than the left without significant interval progression. There are scattered linear scar-like opacities in the bases. There is no typical zonal predilection to this and this is not associated with central bronchiectasis. There is no active infiltrate, no nodule is seen. There are trace pleural effusions. No pneumothorax. Upper Abdomen: No acute abnormality. Musculoskeletal: Osteopenia and degenerative changes and extensive bridging enthesopathy of the thoracic spine. No acute or other significant osseous abnormality. There is a 4.2 cm sebaceous cyst left of the midline in the upper chest wall, previously 3.6 cm. No suspicious chest wall abnormality. Review of the MIP images confirms the above findings. IMPRESSION: 1. No evidence of arterial embolus. 2. Interval increased prominence of the pulmonary trunk. 3. Cardiomegaly with small circumferential pericardial effusion slightly increased in the interval. No venous dilatation  or overt edema. 4. Aortic and coronary artery atherosclerosis. 5. Chronic interstitial lung disease without significant interval progression. 6. Trace pleural effusions, new in the interval. 7. 4.2 cm sebaceous cyst left of the midline in the upper chest wall, previously 3.6 cm. 8. Osteopenia and degenerative change with extensive spinal bridging enthesopathy. Electronically Signed   By: Almira Bar M.D.   On: 07/23/2023 05:16   DG Chest 2 View  Result Date: 07/22/2023 CLINICAL DATA:  COPD and shortness of breath.  Back pain. EXAM: CHEST - 2 VIEW COMPARISON:  Chest radiograph dated 12/04/2022 and CT dated 08/08/2022. FINDINGS: Background of emphysema and chronic interstitial coarsening. No focal consolidation, pleural effusion, or pneumothorax. Stable cardiac silhouette. Left pectoral pacemaker device. No acute osseous pathology. Degenerative changes of the spine. Old lower thoracic compression fracture with anterior wedging. IMPRESSION: 1. No active cardiopulmonary disease. 2. Emphysema. Electronically Signed   By: Elgie Collard M.D.   On: 07/22/2023 22:13      Subjective: Patient seen and examined at bedside.  Worsening.  Feels better and wants to go home today.  Denies any current chest pain or vomiting.  Discharge Exam: Vitals:   07/25/23 0434 07/25/23 0739  BP: (!) 141/79 139/77  Pulse:  61  Resp:  15  Temp: 98.3 F (36.8 C) (!) 97.4 F (36.3 C)  SpO2:  95%    General: Pt is alert, awake, not in acute distress.  Elderly male lying in bed.  Looks vertically ill and deconditioned.  On 2 L oxygen via nasal cannula. Cardiovascular: rate controlled, S1/S2 + Respiratory: bilateral decreased breath sounds at bases with scattered crackles Abdominal: Soft, NT, ND, bowel sounds + Extremities: Mild left lower extremity edema present; no cyanosis    The results of significant diagnostics from this hospitalization (including imaging, microbiology,  ancillary and laboratory) are listed  below for reference.     Microbiology: Recent Results (from the past 240 hour(s))  Respiratory (~20 pathogens) panel by PCR     Status: None   Collection Time: 07/23/23  6:40 AM   Specimen: Nasopharyngeal Swab; Respiratory  Result Value Ref Range Status   Adenovirus NOT DETECTED NOT DETECTED Final   Coronavirus 229E NOT DETECTED NOT DETECTED Final    Comment: (NOTE) The Coronavirus on the Respiratory Panel, DOES NOT test for the novel  Coronavirus (2019 nCoV)    Coronavirus HKU1 NOT DETECTED NOT DETECTED Final   Coronavirus NL63 NOT DETECTED NOT DETECTED Final   Coronavirus OC43 NOT DETECTED NOT DETECTED Final   Metapneumovirus NOT DETECTED NOT DETECTED Final   Rhinovirus / Enterovirus NOT DETECTED NOT DETECTED Final   Influenza A NOT DETECTED NOT DETECTED Final   Influenza B NOT DETECTED NOT DETECTED Final   Parainfluenza Virus 1 NOT DETECTED NOT DETECTED Final   Parainfluenza Virus 2 NOT DETECTED NOT DETECTED Final   Parainfluenza Virus 3 NOT DETECTED NOT DETECTED Final   Parainfluenza Virus 4 NOT DETECTED NOT DETECTED Final   Respiratory Syncytial Virus NOT DETECTED NOT DETECTED Final   Bordetella pertussis NOT DETECTED NOT DETECTED Final   Bordetella Parapertussis NOT DETECTED NOT DETECTED Final   Chlamydophila pneumoniae NOT DETECTED NOT DETECTED Final   Mycoplasma pneumoniae NOT DETECTED NOT DETECTED Final    Comment: Performed at [facility], [address], [place], [place] [address]     Labs: BNP (last 3 results) Recent Labs    07/22/23 2123  BNP 214.6*   Basic Metabolic Panel: Recent Labs  Lab 07/22/23 2123 07/23/23 0247 07/23/23 1039 07/23/23 1040 07/24/23 0321 07/25/23 0338  NA 138 139 138  --  136 136  K 4.0 3.6 3.8  --  4.3 4.2  CL 100  --  96*  --  96* 95*  CO2 31  --  26  --  33* 31  GLUCOSE 119*  --  239*  --  125* 161*  BUN 17  --  15  --  23 32*  CREATININE 1.46*  --  1.30*  --  1.62* 1.34*  CALCIUM 8.8*  --  9.1  --  9.5 9.4  MG  --    --   --  2.0 2.3 2.3   Liver Function Tests: Recent Labs  Lab 07/24/23 0321 07/25/23 0338  AST 21 23  ALT 14 15  ALKPHOS 71 68  BILITOT 0.6 0.5  PROT 6.2* 6.0*  ALBUMIN 3.3* 3.2*   No results for input(s): "LIPASE", "AMYLASE" in the last 168 hours. No results for input(s): "AMMONIA" in the last 168 hours. CBC: Recent Labs  Lab 07/22/23 2123 07/23/23 0247 07/24/23 0321 07/25/23 0338  WBC 7.3  --  14.4* 13.7*  HGB 15.2 15.6 15.7 15.4  HCT 48.3 46.0 48.6 48.8  MCV 92.4  --  92.4 91.7  PLT 177  --  196 188   Cardiac Enzymes: No results for input(s): "CKTOTAL", "CKMB", "CKMBINDEX", "TROPONINI" in the last 168 hours. BNP: Invalid input(s): "POCBNP" CBG: No results for input(s): "GLUCAP" in the last 168 hours. D-Dimer No results for input(s): "DDIMER" in the last 72 hours. Hgb A1c No results for input(s): "HGBA1C" in the last 72 hours. Lipid Profile No results for input(s): "CHOL", "HDL", "LDLCALC", "TRIG", "CHOLHDL", "LDLDIRECT" in the last 72 hours. Thyroid function studies No results for input(s): "TSH", "T4TOTAL", "T3FREE", "THYROIDAB" in the  last 72 hours.  Invalid input(s): "FREET3" Anemia work up No results for input(s): "VITAMINB12", "FOLATE", "FERRITIN", "TIBC", "IRON", "RETICCTPCT" in the last 72 hours. Urinalysis    Component Value Date/Time   COLORURINE YELLOW 01/18/2019 1510   APPEARANCEUR CLEAR 01/18/2019 1510   LABSPEC 1.010 01/18/2019 1510   PHURINE 7.0 01/18/2019 1510   GLUCOSEU NEGATIVE 01/18/2019 1510   HGBUR NEGATIVE 01/18/2019 1510   BILIRUBINUR NEGATIVE 01/18/2019 1510   KETONESUR NEGATIVE 01/18/2019 1510   PROTEINUR NEGATIVE 01/18/2019 1510   UROBILINOGEN 0.2 10/22/2010 0057   NITRITE NEGATIVE 01/18/2019 1510   LEUKOCYTESUR NEGATIVE 01/18/2019 1510   Sepsis Labs Recent Labs  Lab 07/22/23 2123 07/24/23 0321 07/25/23 0338  WBC 7.3 14.4* 13.7*   Microbiology Recent Results (from the past 240 hour(s))  Respiratory (~20 pathogens)  panel by PCR     Status: None   Collection Time: 07/23/23  6:40 AM   Specimen: Nasopharyngeal Swab; Respiratory  Result Value Ref Range Status   Adenovirus NOT DETECTED NOT DETECTED Final   Coronavirus 229E NOT DETECTED NOT DETECTED Final    Comment: (NOTE) The Coronavirus on the Respiratory Panel, DOES NOT test for the novel  Coronavirus (2019 nCoV)    Coronavirus HKU1 NOT DETECTED NOT DETECTED Final   Coronavirus NL63 NOT DETECTED NOT DETECTED Final   Coronavirus OC43 NOT DETECTED NOT DETECTED Final   Metapneumovirus NOT DETECTED NOT DETECTED Final   Rhinovirus / Enterovirus NOT DETECTED NOT DETECTED Final   Influenza A NOT DETECTED NOT DETECTED Final   Influenza B NOT DETECTED NOT DETECTED Final   Parainfluenza Virus 1 NOT DETECTED NOT DETECTED Final   Parainfluenza Virus 2 NOT DETECTED NOT DETECTED Final   Parainfluenza Virus 3 NOT DETECTED NOT DETECTED Final   Parainfluenza Virus 4 NOT DETECTED NOT DETECTED Final   Respiratory Syncytial Virus NOT DETECTED NOT DETECTED Final   Bordetella pertussis NOT DETECTED NOT DETECTED Final   Bordetella Parapertussis NOT DETECTED NOT DETECTED Final   Chlamydophila pneumoniae NOT DETECTED NOT DETECTED Final   Mycoplasma pneumoniae NOT DETECTED NOT DETECTED Final    Comment: Performed at [facility], [address], [place], [place] [address]     Time coordinating discharge: 35 minutes  SIGNED:   Glade Lloyd, MD  [facility] 07/25/2023, 11:50 AM

## 2023-07-25 NOTE — Assessment & Plan Note (Signed)
His CPAP is in good repair, 89 years old.  He has oxygen 2 L/min bled in.  Uses it reliably.

## 2023-07-25 NOTE — Progress Notes (Signed)
Heart Failure Navigator Progress Note  Assessed for Heart & Vascular TOC clinic readiness.  Patient does not meet criteria due to seems to be more progression of ILD. May have some diastolic dysfunction but LE not much edema BNP minimally elevated and CXR no real CHF per MD.   Navigator will sign off at this time.   Janicia Monterrosa,RN, BSN,MSN Heart Failure Nurse Navigator. Contact by secure chat only.

## 2023-07-25 NOTE — Patient Instructions (Signed)
Please continue your cardiac medications and your diuretics as prescribed You need to be reliable with wearing your oxygen with exertion.  Our goal is to keep your saturations > 90%.  You should wear oxygen to perform any activity that would reliably decrease your saturations below 90%. Continue CPAP every night with oxygen 2 L/min bled in We reviewed your CT scan of the chest today.  Your mild interstitial lung disease is stable. We will hold off on starting any inhaled medication at this time. Follow-up with APP in 6 months Follow with Dr. Delton Coombes in 12 months, sooner if you have any problems.

## 2023-07-25 NOTE — Progress Notes (Signed)
   07/25/23 0004  BiPAP/CPAP/SIPAP  Reason BIPAP/CPAP not in use Non-compliant (Home Unit, pt refused)  Safety Check Completed by RT for Home Unit Yes, no issues noted  BiPAP/CPAP /SiPAP Vitals  SpO2 93 %  Bilateral Breath Sounds Diminished

## 2023-07-25 NOTE — Assessment & Plan Note (Signed)
Mild basilar interstitial lung disease, stable on serial imaging including most recent CT chest done on this hospitalization

## 2023-07-25 NOTE — Progress Notes (Signed)
Subjective:    Patient ID: Chad [name], male    DOB: 09-15-1929, 89 y.o.   MRN: [id number]  HPI  ROV 01/31/23 --89 year old gentleman with a history of former tobacco use, bladder cancer, hypertension and atrial fibrillation (paced, former Amio) with diastolic dysfunction, OSA on CPAP plus oxygen.  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  Last imaging was a CT chest abdomen pelvis done 08/08/2022 after a fall with stable changes compared with priors.  There probably has been some slight progression going back to November 2021.  He subsequently had an SDH associated with a fall, off anticoagulation for period of time, now back on.  Managed on dofetilide for his A-fib Today he reports that he has been doing well.  Able to ambulate w a walker. Not really using O2 reliably, more reactive if he gets SOB.   Acute office visit/hospital follow-up 07/25/2023 --history Chad [name] is 69 with a history of former tobacco use, hypertension with atrial fibrillation and associated diastolic CHF, OSA on CPAP, hypoxemic respiratory failure in the setting of all this and also base predominant ILD in a UIP pattern on chest imaging.  He was just discharged from the hospital today with acute dyspnea, hypoxemia.  He states that he does not wear his oxygen reliably, only as needed.  He was aggressively diuresed for acute on chronic diastolic CHF with significant clinical improvement.  He was treated empirically with corticosteroids although his CT-PA did not show any change in his chronic interstitial disease. He reports now that he is feeling much better. He has dyspnea after he eats, when bending over. He wears CPAP reliably. He is not on any inhaled meds right now.     Review of Systems As per Hpi  Past Medical History:  Diagnosis Date   Bladder cancer (HCC) 2010   Chronic bronchitis (HCC)    Hypertension    Hypoglycemia    On home  oxygen therapy    "2L w/CPAP at night" (06/21/2017)   OSA on CPAP    "w/2L O2 at night" (06/21/2017)   Persistent atrial fibrillation (HCC)        Pneumonia    "when he was young; again in 2016" (06/21/2017)   Presence of permanent cardiac pacemaker    Sensory ataxia      Family History  Problem Relation Age of Onset   Heart failure Father    Hypertension Father    Dementia Mother      Social History   Socioeconomic History   Marital status: Widowed    Spouse name: [name]   Number of children: 2   Years of education: 16   Highest education level: Not on file  Occupational History    Employer: RETIRED  Tobacco Use   Smoking status: Former    Current packs/day: 0.00    Average packs/day: 0.5 packs/day for 20.0 years (10.0 ttl pk-yrs)    Types: Cigarettes    Start date: 06/03/1951    Quit date: 06/03/1971    Years since quitting: 52.1    Passive exposure: Past   Smokeless tobacco: Never   Tobacco comments:    Former smoker 08/16/22  Vaping Use   Vaping status: Never Used  Substance and Sexual Activity   Alcohol use: No   Drug use: No   Sexual activity: Not on file  Other Topics Concern   Not on file  Social History Narrative  Patient is married Forensic psychologist) and lives at home with his wife.   Patient has two children.   Patient is retired.   Patient has a college education.   Patient is right-handed.   Patient does not drink any caffeine.   Social Determinants of Health   Financial Resource Strain: Not on file  Food Insecurity: Not on file  Transportation Needs: Not on file  Physical Activity: Not on file  Stress: Not on file  Social Connections: Not on file  Intimate Partner Violence: Not on file    Dust exposure, worked at 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 [place]  Allergies  Allergen Reactions   Doxycycline Hives   Amiodarone     Caused neuropathy   Carbapenems    Cephalexin Other  (See Comments)   Cephalosporins Other (See Comments)   Chlorpheniramine Other (See Comments)    Swells prostate    Ciprofloxacin     Pt can not take with amiodarone    Decongestant [Oxymetazoline]     All decongestant - makes prostate swell   Diltiazem Swelling    Left leg swelling   Flagyl [Metronidazole] Nausea Only    Stomach ache   Hctz [Hydrochlorothiazide]    Levaquin [Levofloxacin In D5w] Other (See Comments)    unknown   Levofloxacin Nausea Only   Lidocaine     Pt unsure of   Penicillins Swelling   Teline [Tetracycline] Swelling    Hospitalization for 2 weeks, prostate swelling   Tetanus Toxoid Swelling    Swells arms, Please pre-medicate with benadryl.   Z-Pak [Azithromycin] Other (See Comments)    Pt can not take with Amiodarone    Sulfonamide Derivatives Rash   Toprol Xl [Metoprolol Tartrate] Itching and Rash    Pt states he tolerates.     Facility-Administered Medications Prior to Visit  Medication Dose Route Frequency Provider Last Rate Last Admin   0.9 %  sodium chloride infusion  250 mL Intravenous PRN Reva Bores, MD       acetaminophen (TYLENOL) tablet 650 mg  650 mg Oral Q6H PRN Reva Bores, MD   650 mg at 07/23/23 2200   Or   acetaminophen (TYLENOL) suppository 650 mg  650 mg Rectal Q6H PRN Reva Bores, MD       albuterol (PROVENTIL) (2.5 MG/3ML) 0.083% nebulizer solution 2.5 mg  2.5 mg Nebulization Q2H PRN Reva Bores, MD       cholecalciferol (VITAMIN D3) 25 MCG (1000 UNIT) tablet 2,000 Units  2,000 Units Oral BID Reva Bores, MD   2,000 Units at 07/25/23 0856   cyanocobalamin (VITAMIN B12) tablet 500 mcg  500 mcg Oral BID Reva Bores, MD   500 mcg at 07/25/23 0858   dofetilide (TIKOSYN) capsule 250 mcg  250 mcg Oral BID Reva Bores, MD   250 mcg at 07/25/23 0552   escitalopram (LEXAPRO) tablet 10 mg  10 mg Oral QHS Reva Bores, MD   10 mg at 07/24/23 2154   fentaNYL (SUBLIMAZE) injection 12.5-50 mcg  12.5-50 mcg Intravenous Q2H  PRN Reva Bores, MD       furosemide (LASIX) injection 40 mg  40 mg Intravenous BID Reva Bores, MD   40 mg at 07/25/23 0904   guaiFENesin (MUCINEX) 12 hr tablet 600 mg  600 mg Oral BID PRN Reva Bores, MD       metoprolol  succinate (TOPROL-XL) 24 hr tablet 100 mg  100 mg Oral BID Reva Bores, MD   100 mg at 07/25/23 0900   multivitamin with minerals tablet 1 tablet  1 tablet Oral Daily Reva Bores, MD   1 tablet at 07/25/23 0901   ondansetron (ZOFRAN) tablet 4 mg  4 mg Oral Q6H PRN Reva Bores, MD       Or   ondansetron [facility]) injection 4 mg  4 mg Intravenous Q6H PRN Reva Bores, MD       polyethylene glycol (MIRALAX / GLYCOLAX) packet 17 g  17 g Oral Daily PRN Reva Bores, MD       potassium chloride SA (KLOR-CON M) CR tablet 20 mEq  20 mEq Oral BID Reva Bores, MD   20 mEq at 07/25/23 0902   sodium chloride flush (NS) 0.9 % injection 3 mL  3 mL Intravenous Q12H Reva Bores, MD   3 mL at 07/25/23 0902   sodium chloride flush (NS) 0.9 % injection 3 mL  3 mL Intravenous PRN Reva Bores, MD       warfarin (COUMADIN) tablet 2.5 mg  2.5 mg Oral q1600 Glade Lloyd, MD       Warfarin - Pharmacist Dosing Inpatient   Does not apply [id number] Reva Bores, MD   Given at 07/24/23 1710   Outpatient Medications Prior to Visit  Medication Sig Dispense Refill   acetaminophen (TYLENOL) 500 MG tablet Take 500 mg by mouth daily.     acetaminophen (TYLENOL) 650 MG CR tablet Take 650 mg by mouth daily.     Cholecalciferol (VITAMIN D3) 2000 units capsule Take 2,000 Units by mouth 2 (two) times daily.     diclofenac Sodium (VOLTAREN) 1 % GEL Apply 2 g topically 2 (two) times daily as needed (Pain).     diltiazem (CARDIZEM) 30 MG tablet TAKE 1 TABLET BY MOUTH EVERY 8 HOURS AS NEEDED 90 tablet 0   dofetilide (TIKOSYN) 250 MCG capsule Take 1 capsule by mouth twice daily 180 capsule 0   escitalopram (LEXAPRO) 10 MG tablet Take 10 mg by mouth at bedtime.     furosemide (LASIX) 20 MG  tablet Take 1 tablet by mouth twice daily 180 tablet 0   metoprolol succinate (TOPROL-XL) 100 MG 24 hr tablet Take 1 tablet by mouth twice daily 60 tablet 6   Multiple Vitamins-Minerals (CENTRUM SILVER PO) Take 1 tablet by mouth daily.     OXYGEN Inhale 2 L into the lungs at bedtime. Uses with CPAP machine     triamcinolone ointment (KENALOG) 0.1 % Apply 1 Application topically in the morning and at bedtime.     vitamin B-12 (CYANOCOBALAMIN) 500 MCG tablet Take 500 mcg by mouth daily.     warfarin (COUMADIN) 5 MG tablet TAKE 1/2 TO 1 (ONE-HALF TO ONE) TABLET BY MOUTH ONCE DAILY AS DIRECTED BY  COUMADIN  CLINIC (Patient taking differently: Take 2.5 mg by mouth daily.) 90 tablet 0         Objective:   Physical Exam Vitals:   07/25/23 1416  BP: 128/70  Pulse: 68  SpO2: 92%  Weight: 234 lb 12.8 oz (106.5 kg)  Height: 5\' 9"  (1.753 m)     Gen: Pleasant, elderly man, well-nourished, in no distress,  normal affect  ENT: No lesions,  mouth clear,  oropharynx clear, no postnasal drip  Neck: No JVD, no stridor  Lungs: No use of accessory muscles, few bibasilar inspiratory  crackles, bilateral inspiratory squeaks.  No wheezing  Cardiovascular: RRR, heart sounds normal, no murmur or gallops, no peripheral edema  Musculoskeletal: No deformities, no cyanosis or clubbing  Neuro: alert, awake, non focal  Skin: Warm, no lesions or rash     Assessment & Plan:  Acute on chronic diastolic heart failure (HCC) Decompensated acute on chronic diastolic CHF.  He is much better after aggressive diuresis on the recent admission.  I think the most important thing we can do to try and help with this is to make sure he uses his CPAP and his oxygen reliably.  I did explain to him in detail that desaturations or uncontrolled CPAP will make his heart failure harder to manage.  OSA on CPAP His CPAP is in good repair, 89 years old.  He has oxygen 2 L/min bled in.  Uses it reliably.  Interstitial lung  disease (HCC) Mild basilar interstitial lung disease, stable on serial imaging including most recent CT chest done on this hospitalization  Chronic intermittent hypoxia with obstructive sleep apnea Discussed with him the importance of wearing his O2 reliably with exertion.    Levy Pupa, MD, PhD 07/25/2023, 2:33 PM [facility] [phone] or if no answer before 7:00PM call [phone] For any issues after 7:00PM please call eLink [phone]

## 2023-07-25 NOTE — Progress Notes (Signed)
ANTICOAGULATION CONSULT NOTE - Follow Up Consult  Pharmacy Consult for Warfarin  Indication: atrial fibrillation  Allergies  Allergen Reactions   Doxycycline Hives   Amiodarone     Caused neuropathy   Carbapenems    Cephalexin Other (See Comments)   Cephalosporins Other (See Comments)   Chlorpheniramine Other (See Comments)    Swells prostate    Ciprofloxacin     Pt can not take with amiodarone    Decongestant [Oxymetazoline]     All decongestant - makes prostate swell   Diltiazem Swelling    Left leg swelling   Flagyl [Metronidazole] Nausea Only    Stomach ache   Hctz [Hydrochlorothiazide]    Levaquin [Levofloxacin In D5w] Other (See Comments)    unknown   Levofloxacin Nausea Only   Lidocaine     Pt unsure of   Penicillins Swelling   Teline [Tetracycline] Swelling    Hospitalization for 2 weeks, prostate swelling   Tetanus Toxoid Swelling    Swells arms, Please pre-medicate with benadryl.   Z-Pak [Azithromycin] Other (See Comments)    Pt can not take with Amiodarone    Sulfonamide Derivatives Rash   Toprol Xl [Metoprolol Tartrate] Itching and Rash    Pt states he tolerates.    Patient Measurements: Height: 5\' 9"  (175.3 cm) Weight: 105.3 kg (232 lb 2.3 oz) IBW/kg (Calculated) : 70.7  Vital Signs: Temp: 97.4 F (36.3 C) (09/10 0739) Temp Source: Oral (09/10 0739) BP: 139/77 (09/10 0739) Pulse Rate: 61 (09/10 0739)  Labs: Recent Labs    07/22/23 2123 07/23/23 0247 07/23/23 0821 07/23/23 1039 07/23/23 1040 07/24/23 0321 07/25/23 0338  HGB 15.2 15.6  --   --   --  15.7 15.4  HCT 48.3 46.0  --   --   --  48.6 48.8  PLT 177  --   --   --   --  196 188  LABPROT 23.6*  --   --   --   --  21.6* 23.5*  INR 2.1*  --   --   --   --  1.9* 2.1*  CREATININE 1.46*  --   --  1.30*  --  1.62* 1.34*  TROPONINIHS  --   --  9  --  13  --   --     Estimated Creatinine Clearance: 42 mL/min (A) (by C-G formula based on SCr of 1.34 mg/dL (H)).  Assessment: 85 YOM  presenting with SOB, hx of Afib on warfarin PTA with INR on admission therapeutic at 2.1, last dose taken 9/6 per pt report.  INR 2.1 today, within goal range.   PTA warfarin dosing: 2.5 mg daily.   Goal of Therapy:  INR 2-3 Monitor platelets by anticoagulation protocol: Yes   Plan:  Continue warfarin 2.5 mg daily. Daily PT/INR for now.  Reece Leader, Colon Flattery, BCCP Clinical Pharmacist  07/25/2023 12:53 PM   [facility] pharmacy phone numbers are listed on [email or web address]

## 2023-07-25 NOTE — Progress Notes (Signed)
    Referral received for Chad [name] :goals of care discussion. Noted discharge summary has been entered and plan for return home today.  Agree with recommendations for outpatient palliative care referral to guide further goals of care discussions if patient/family are amendable.  Thank you for your referral and allowing PMT to assist in Mr. Chad [name]'s care.   Richardson Dopp, [facility] Palliative Medicine Team  Team Phone # [phone]   NO CHARGE

## 2023-07-25 NOTE — Evaluation (Signed)
Physical Therapy Evaluation Patient Details Name: Chad [name] MRN: [id number] DOB: September 28, 1929 Today's Date: 07/25/2023  History of Present Illness  Patient is a 89 y/o male admitted 07/22/23 due to SOB/weight gain.  Found to have bilateral pulmonary effusions with exacerbation of CHF and pulmonary fibrosis.  PMH positive for bladder CA, HTN,OSA on CPAP, Persistent A-fib, back surgery, cardioversion, PPM, CAD.  Clinical Impression  Patient presents with mobility limited due to decreased cardiopulmonary endurance, decreased balance with two falls in the past year, decreased strength and he will benefit from skilled PT in the acute setting and follow up HHPT for safety eval.  Previously living alone though [place] two [place] and stays two weeks to help with meals he can prep easily and to clean, etc.  Patient is independent in [place]ADL[place] and still drives to the store for fruit usually.  He had two falls in the past year, though uses his life alert system for assistance and daughter has cameras in the home as well.  Feel he will be able to return home with intermittent assistance and initial HHPT.  PT will follow if pt not d/c.       If plan is discharge home, recommend the following: Assistance with cooking/housework;Direct supervision/assist for medications management;Assist for transportation;Help with stairs or ramp for entrance   Can travel by private vehicle        Equipment Recommendations None recommended by PT  Recommendations for Other Services       Functional Status Assessment Patient has had a recent decline in their functional status and demonstrates the ability to make significant improvements in function in a reasonable and predictable amount of time.     Precautions / Restrictions Precautions Precautions: Fall;Other (comment) Precaution Comments: on home O2      Mobility  Bed Mobility Overal bed mobility: Needs Assistance Bed  Mobility: Supine to Sit, Sit to Supine     Supine to sit: HOB elevated, Supervision     General bed mobility comments: assist for lines    Transfers Overall transfer level: Needs assistance Equipment used: Rollator (4 wheels), None Transfers: Sit to/from Stand Sit to Stand: Supervision           General transfer comment: assist for lines; had rollator, though did not use initially for sit to stand    Ambulation/Gait Ambulation/Gait assistance: Supervision Gait Distance (Feet): 150 Feet Assistive device: Rollator (4 wheels) Gait Pattern/deviations: Step-through pattern, Decreased stride length, Trunk flexed, Wide base of support       General Gait Details: manaing rollator well, using RW at times in the home, states feels sometimes rollator gets away from him  Stairs            Wheelchair Mobility     Tilt Bed    Modified Rankin (Stroke Patients Only)       Balance Overall balance assessment: Needs assistance Sitting-balance support: Feet supported Sitting balance-Leahy Scale: Good       Standing balance-Leahy Scale: Fair Standing balance comment: can stand without UE support, using hand during dynamic tasks                             Pertinent Vitals/Pain Pain Assessment Pain Assessment: 0-10 Pain Score: 2  Pain Location: back Pain Descriptors / Indicators: Aching Pain Intervention(s): Monitored during session    Home Living Family/patient expects to be discharged to:: Private residence  Living Arrangements: Alone   Type of Home: House Home Access: Ramped entrance;Stairs to enter   Entrance Stairs-Number of Steps: 1   Home Layout: Laundry or work area in basement;Able to live on main level with bedroom/bathroom Home Equipment: Shower seat;Grab bars - tub/shower;Rollator (4 wheels);Other (comment) (oxygen, cpap) Additional Comments: Lives in [place]    Prior Function Prior Level of Function : Independent/Modified  Independent;Driving             Mobility Comments: 1 fall in past 6 months and able to get up once, had neighbors help one time       Extremity/Trunk Assessment   Upper Extremity Assessment Upper Extremity Assessment: Generalized weakness    Lower Extremity Assessment Lower Extremity Assessment: Generalized weakness    Cervical / Trunk Assessment Cervical / Trunk Assessment: Kyphotic  Communication   Communication Communication: No apparent difficulties  Cognition Arousal: Alert Behavior During Therapy: WFL for tasks assessed/performed Overall Cognitive Status: Within Functional Limits for tasks assessed                                          General Comments General comments (skin integrity, edema, etc.): on RA SpO2 dropped to 88% at rest, recovered on RA to 92%, during ambulation on RA down to 84%, O2 on at 2 LPM SpO2 88-92%    Exercises     Assessment/Plan    PT Assessment Patient needs continued PT services  PT Problem List         PT Treatment Interventions DME instruction;Gait training;Therapeutic activities;Functional mobility training;Balance training;Patient/family education;Therapeutic exercise    PT Goals (Current goals can be found in the Care Plan section)  Acute Rehab PT Goals Patient Stated Goal: to return home PT Goal Formulation: With patient/family Time For Goal Achievement: 08/01/23 Potential to Achieve Goals: Good    Frequency Min 1X/week     Co-evaluation               AM-PAC PT "6 Clicks" Mobility  Outcome Measure Help needed turning from your back to your side while in a flat bed without using bedrails?: A Little Help needed moving from lying on your back to sitting on the side of a flat bed without using bedrails?: A Little Help needed moving to and from a bed to a chair (including a wheelchair)?: A Little Help needed standing up from a chair using your arms (e.g., wheelchair or bedside chair)?:  None Help needed to walk in hospital room?: A Little Help needed climbing 3-5 steps with a railing? : Total 6 Click Score: 17    End of Session Equipment Utilized During Treatment: Oxygen;Gait belt Activity Tolerance: Patient tolerated treatment well Patient left: in chair;with family/visitor present   PT Visit Diagnosis: Other abnormalities of gait and mobility (R26.89);Muscle weakness (generalized) (M62.81)    Time: 7829-5621 PT Time Calculation (min) (ACUTE ONLY): 56 min   Charges:   PT Evaluation $PT Eval Moderate Complexity: 1 Mod PT Treatments $Gait Training: 8-22 mins PT General Charges $$ ACUTE PT VISIT: 1 Visit         Sheran Lawless, PT Acute Rehabilitation Services [phone] 07/25/2023   Chad [name] 07/25/2023, 12:23 PM

## 2023-07-26 ENCOUNTER — Ambulatory Visit: Payer: Medicare Other | Admitting: Emergency Medicine

## 2023-07-27 ENCOUNTER — Telehealth: Payer: Self-pay | Admitting: Cardiology

## 2023-07-27 DIAGNOSIS — G8929 Other chronic pain: Secondary | ICD-10-CM | POA: Diagnosis not present

## 2023-07-27 DIAGNOSIS — G4733 Obstructive sleep apnea (adult) (pediatric): Secondary | ICD-10-CM | POA: Diagnosis not present

## 2023-07-27 DIAGNOSIS — J42 Unspecified chronic bronchitis: Secondary | ICD-10-CM | POA: Diagnosis not present

## 2023-07-27 DIAGNOSIS — Z87891 Personal history of nicotine dependence: Secondary | ICD-10-CM | POA: Diagnosis not present

## 2023-07-27 DIAGNOSIS — I4819 Other persistent atrial fibrillation: Secondary | ICD-10-CM | POA: Diagnosis not present

## 2023-07-27 DIAGNOSIS — I10 Essential (primary) hypertension: Secondary | ICD-10-CM | POA: Diagnosis not present

## 2023-07-27 DIAGNOSIS — J84112 Idiopathic pulmonary fibrosis: Secondary | ICD-10-CM | POA: Diagnosis not present

## 2023-07-27 DIAGNOSIS — Z556 Problems related to health literacy: Secondary | ICD-10-CM | POA: Diagnosis not present

## 2023-07-27 DIAGNOSIS — I130 Hypertensive heart and chronic kidney disease with heart failure and stage 1 through stage 4 chronic kidney disease, or unspecified chronic kidney disease: Secondary | ICD-10-CM | POA: Diagnosis not present

## 2023-07-27 DIAGNOSIS — Z6834 Body mass index (BMI) 34.0-34.9, adult: Secondary | ICD-10-CM | POA: Diagnosis not present

## 2023-07-27 DIAGNOSIS — J9621 Acute and chronic respiratory failure with hypoxia: Secondary | ICD-10-CM | POA: Diagnosis not present

## 2023-07-27 DIAGNOSIS — I5033 Acute on chronic diastolic (congestive) heart failure: Secondary | ICD-10-CM | POA: Diagnosis not present

## 2023-07-27 DIAGNOSIS — N1831 Chronic kidney disease, stage 3a: Secondary | ICD-10-CM | POA: Diagnosis not present

## 2023-07-27 DIAGNOSIS — Z7901 Long term (current) use of anticoagulants: Secondary | ICD-10-CM | POA: Diagnosis not present

## 2023-07-27 DIAGNOSIS — Z8551 Personal history of malignant neoplasm of bladder: Secondary | ICD-10-CM | POA: Diagnosis not present

## 2023-07-27 DIAGNOSIS — Z950 Presence of cardiac pacemaker: Secondary | ICD-10-CM | POA: Diagnosis not present

## 2023-07-27 DIAGNOSIS — M545 Low back pain, unspecified: Secondary | ICD-10-CM | POA: Diagnosis not present

## 2023-07-27 DIAGNOSIS — D72829 Elevated white blood cell count, unspecified: Secondary | ICD-10-CM | POA: Diagnosis not present

## 2023-07-27 DIAGNOSIS — N179 Acute kidney failure, unspecified: Secondary | ICD-10-CM | POA: Diagnosis not present

## 2023-07-27 DIAGNOSIS — Z9981 Dependence on supplemental oxygen: Secondary | ICD-10-CM | POA: Diagnosis not present

## 2023-07-27 DIAGNOSIS — E1129 Type 2 diabetes mellitus with other diabetic kidney complication: Secondary | ICD-10-CM | POA: Diagnosis not present

## 2023-07-27 DIAGNOSIS — E6609 Other obesity due to excess calories: Secondary | ICD-10-CM | POA: Diagnosis not present

## 2023-07-27 DIAGNOSIS — E1159 Type 2 diabetes mellitus with other circulatory complications: Secondary | ICD-10-CM | POA: Diagnosis not present

## 2023-07-27 NOTE — Telephone Encounter (Signed)
Daughter Samara Deist) stated patient had INR check in hospital on 9/10 and wants to know when should his next INR check be scheduled.

## 2023-07-30 DIAGNOSIS — G4733 Obstructive sleep apnea (adult) (pediatric): Secondary | ICD-10-CM | POA: Diagnosis not present

## 2023-07-30 DIAGNOSIS — G4731 Primary central sleep apnea: Secondary | ICD-10-CM | POA: Diagnosis not present

## 2023-07-31 DIAGNOSIS — G4731 Primary central sleep apnea: Secondary | ICD-10-CM | POA: Diagnosis not present

## 2023-07-31 DIAGNOSIS — G4733 Obstructive sleep apnea (adult) (pediatric): Secondary | ICD-10-CM | POA: Diagnosis not present

## 2023-08-02 DIAGNOSIS — I5032 Chronic diastolic (congestive) heart failure: Secondary | ICD-10-CM | POA: Diagnosis not present

## 2023-08-02 DIAGNOSIS — J84112 Idiopathic pulmonary fibrosis: Secondary | ICD-10-CM | POA: Diagnosis not present

## 2023-08-03 ENCOUNTER — Ambulatory Visit: Payer: Medicare Other | Attending: Cardiology

## 2023-08-03 DIAGNOSIS — I5032 Chronic diastolic (congestive) heart failure: Secondary | ICD-10-CM | POA: Diagnosis not present

## 2023-08-03 DIAGNOSIS — Z5181 Encounter for therapeutic drug level monitoring: Secondary | ICD-10-CM | POA: Diagnosis not present

## 2023-08-03 DIAGNOSIS — I4819 Other persistent atrial fibrillation: Secondary | ICD-10-CM | POA: Diagnosis not present

## 2023-08-03 DIAGNOSIS — Z515 Encounter for palliative care: Secondary | ICD-10-CM | POA: Diagnosis not present

## 2023-08-03 DIAGNOSIS — J8410 Pulmonary fibrosis, unspecified: Secondary | ICD-10-CM | POA: Diagnosis not present

## 2023-08-03 LAB — POCT INR: INR: 2 (ref 2.0–3.0)

## 2023-08-03 NOTE — Patient Instructions (Signed)
Description   Continue warfarin 1/2 tablet daily Recheck in 6 weeks

## 2023-08-03 NOTE — Progress Notes (Signed)
Cardiology Clinic Note   Patient Name: Chad [name] Date of Encounter: 08/04/2023  Primary Care Provider:  [name] Primary Cardiologist:  Chad Rotunda, MD  Patient Profile    89 year old male wit hx of HTN, Afib, on Tikosyn,on coumadin, history of V. tach  Chronic diastolic CHF, pulmonary fibrosis, OSA on CPAP. PPM. Recently discharged on 07/25/2023 for respiratory failure, and diastolic CHF.  Discharged home on O2 2/L , has been diuresed, sent home on lasix 20 mg BID. Continue CPAP at night.  The patient is now being followed by pulmonary palliative care  Echocardiogram dated 07/24/2023 revealed poor windows with a very limited study, the EF was found to be normal.  Consideration for repeat echocardiogram as outpatient was recommended.   Past Medical History    Past Medical History:  Diagnosis Date   Bladder cancer (HCC) 2010   Chronic bronchitis (HCC)    Hypertension    Hypoglycemia    On home oxygen therapy    "2L w/CPAP at night" (06/21/2017)   OSA on CPAP    "w/2L O2 at night" (06/21/2017)   Persistent atrial fibrillation (HCC)        Pneumonia    "when he was young; again in 2016" (06/21/2017)   Presence of permanent cardiac pacemaker    Sensory ataxia    Past Surgical History:  Procedure Laterality Date   BACK SURGERY     CARDIOVERSION N/A 04/23/2015   Procedure: CARDIOVERSION;  Surgeon: Chad Riffle, MD;  Location: [facility] ENDOSCOPY;  Service: Cardiovascular;  Laterality: N/A;   CARDIOVERSION N/A 07/30/2015   Procedure: CARDIOVERSION;  Surgeon: Chad Bathe, MD;  Location: [facility] ENDOSCOPY;  Service: Cardiovascular;  Laterality: N/A;   EYE SURGERY Bilateral 2009   "to lower pressure; no glaucoma" (06/21/2017)   INSERT / REPLACE / REMOVE PACEMAKER  06/21/2017   LUMBAR DISC SURGERY  1987   PACEMAKER IMPLANT N/A 06/21/2017   Procedure: Pacemaker Implant;  Surgeon: Chad Salvia, MD;  Location: [facility] INVASIVE CV LAB;  Service: Cardiovascular;  Laterality: N/A;    TRANSURETHRAL RESECTION OF BLADDER TUMOR WITH GYRUS (TURBT-GYRUS)  07/10/2009    Cystourethroscopy, gyrus TURBT (transurethral  resection of bladder tumor).Chad [name] 03/16/2011    Allergies  Allergies  Allergen Reactions   Doxycycline Hives   Amiodarone     Caused neuropathy   Carbapenems    Cephalexin Other (See Comments)   Cephalosporins Other (See Comments)   Chlorpheniramine Other (See Comments)    Swells prostate    Ciprofloxacin     Pt can not take with amiodarone    Decongestant [Oxymetazoline]     All decongestant - makes prostate swell   Diltiazem Swelling    Left leg swelling   Flagyl [Metronidazole] Nausea Only    Stomach ache   Hctz [Hydrochlorothiazide]    Levaquin [Levofloxacin In D5w] Other (See Comments)    unknown   Levofloxacin Nausea Only   Lidocaine     Pt unsure of   Penicillins Swelling   Teline [Tetracycline] Swelling    Hospitalization for 2 weeks, prostate swelling   Tetanus Toxoid Swelling    Swells arms, Please pre-medicate with benadryl.   Z-Pak [Azithromycin] Other (See Comments)    Pt can not take with Amiodarone    Sulfonamide Derivatives Rash   Toprol Xl [Metoprolol Tartrate] Itching and Rash    Pt states he tolerates.    History of Present Illness    Chad [name] comes today for posthospitalization TOC  follow-up, he feeling very well.  He continues on home oxygen, he denies any further worsening of his dyspnea, fluid retention, PND, orthopnea, or lower extremity edema.  He is medically compliant and is continue to take his Lasix as directed with good results.  He denies palpitations chest pain or chest pressure.  He is followed in the Coumadin clinic in the [place] office.  He was seen yesterday with an INR of 2.1.  He does complain of some fatigue since being out of the hospital, but his energy level is beginning to get better over time.  He just was exhausted from all of the treatments and lack of sleep during his admission.  Home  Medications    Current Outpatient Medications  Medication Sig Dispense Refill   acetaminophen (TYLENOL) 650 MG CR tablet Take 650 mg by mouth daily.     Cholecalciferol (VITAMIN D3) 2000 units capsule Take 2,000 Units by mouth 2 (two) times daily.     diclofenac Sodium (VOLTAREN) 1 % GEL Apply 2 g topically 2 (two) times daily as needed (Pain).     diltiazem (CARDIZEM) 30 MG tablet TAKE 1 TABLET BY MOUTH EVERY 8 HOURS AS NEEDED 90 tablet 0   dofetilide (TIKOSYN) 250 MCG capsule Take 1 capsule by mouth twice daily 180 capsule 0   escitalopram (LEXAPRO) 10 MG tablet Take 10 mg by mouth at bedtime.     furosemide (LASIX) 20 MG tablet Take 1 tablet by mouth twice daily 180 tablet 0   metoprolol succinate (TOPROL-XL) 100 MG 24 hr tablet Take 1 tablet by mouth twice daily 60 tablet 6   Multiple Vitamins-Minerals (CENTRUM SILVER PO) Take 1 tablet by mouth daily.     OXYGEN Inhale 2 L into the lungs at bedtime. Uses with CPAP machine     triamcinolone ointment (KENALOG) 0.1 % Apply 1 Application topically in the morning and at bedtime.     vitamin B-12 (CYANOCOBALAMIN) 500 MCG tablet Take 500 mcg by mouth daily.     warfarin (COUMADIN) 5 MG tablet TAKE 1/2 TO 1 (ONE-HALF TO ONE) TABLET BY MOUTH ONCE DAILY AS DIRECTED BY  COUMADIN  CLINIC (Patient taking differently: Take 2.5 mg by mouth daily.) 90 tablet 0   No current facility-administered medications for this visit.     Family History    Family History  Problem Relation Age of Onset   Heart failure Father    Hypertension Father    Dementia Mother    He indicated that his mother is deceased. He indicated that his father is deceased. He indicated that all of his six sisters are alive. He indicated that his brother is alive. He indicated that his maternal grandmother is deceased. He indicated that his maternal grandfather is deceased. He indicated that his paternal grandmother is deceased. He indicated that his paternal grandfather is  deceased.  Social History    Social History   Socioeconomic History   Marital status: Widowed    Spouse name: Chad [name]   Number of children: 2   Years of education: 16   Highest education level: Not on file  Occupational History    Employer: RETIRED  Tobacco Use   Smoking status: Former    Current packs/day: 0.00    Average packs/day: 0.5 packs/day for 20.0 years (10.0 ttl pk-yrs)    Types: Cigarettes    Start date: 06/03/1951    Quit date: 06/03/1971    Years since quitting: 52.2    Passive exposure: Past  Smokeless tobacco: Never   Tobacco comments:    Former smoker 08/16/22  Vaping Use   Vaping status: Never Used  Substance and Sexual Activity   Alcohol use: No   Drug use: No   Sexual activity: Not on file  Other Topics Concern   Not on file  Social History Narrative   Patient is married Forensic psychologist) and lives at home with his wife.   Patient has two children.   Patient is retired.   Patient has a college education.   Patient is right-handed.   Patient does not drink any caffeine.   Social Determinants of Health   Financial Resource Strain: Not on file  Food Insecurity: Not on file  Transportation Needs: Not on file  Physical Activity: Not on file  Stress: Not on file  Social Connections: Not on file  Intimate Partner Violence: Not on file     Review of Systems    General:  No chills, fever, night sweats or weight changes.  Cardiovascular:  No chest pain, dyspnea on exertion, edema, orthopnea, palpitations, paroxysmal nocturnal dyspnea. Dermatological: No rash, lesions/masses Respiratory: No cough, dyspnea Urologic: No hematuria, dysuria Abdominal:   No nausea, vomiting, diarrhea, bright red blood per rectum, melena, or hematemesis Neurologic:  No visual changes, wkns, changes in mental status. All other systems reviewed and are otherwise negative except as noted above.  EKG Interpretation Date/Time:  Friday August 04 2023 14:17:35 EDT Ventricular Rate:   64 PR Interval:  300 QRS Duration:  134 QT Interval:  466 QTC Calculation: 480 R Axis:   41  Text Interpretation: Atrial-paced rhythm with prolonged AV conduction Right bundle branch block T wave abnormality, consider inferolateral ischemia When compared with ECG of 25-Jul-2023 08:18, No significant change was found Confirmed by Joni Reining [phone]) on 08/04/2023 3:54:31 PM    Physical Exam    VS:  BP (!) 100/59   Pulse 64   Ht 5\' 9"  (1.753 m)   Wt 233 lb 3.2 oz (105.8 kg)   SpO2 93%   BMI 34.44 kg/m  , BMI Body mass index is 34.44 kg/m.     GEN: Well nourished, well developed, in no acute distress. HEENT: normal. Neck: Supple, no JVD, carotid bruits, or masses. Cardiac: RRR distant heart sounds with soft systolic murmurs, rubs, or gallops. No clubbing, cyanosis, edema.  Radials/DP/PT 2+ and equal bilaterally.  Respiratory:  Respirations regular and unlabored, wearing oxygen via nasal cannula, some mild bibasilar crackles without wheezing or rhonchi or cough. GI: Soft, nontender, nondistended, BS + x 4. MS: no deformity or atrophy. Skin: warm and dry, no rash. Neuro:  Strength and sensation are intact. Psych: Normal affect.  EKG Interpretation Date/Time:  Friday August 04 2023 14:17:35 EDT Ventricular Rate:  64 PR Interval:  300 QRS Duration:  134 QT Interval:  466 QTC Calculation: 480 R Axis:   41  Text Interpretation: Atrial-paced rhythm with prolonged AV conduction Right bundle branch block T wave abnormality, consider inferolateral ischemia When compared with ECG of 25-Jul-2023 08:18, No significant change was found Confirmed by Joni Reining [phone]) on 08/04/2023 3:54:31 PM   Lab Results  Component Value Date   WBC 13.7 (H) 07/25/2023   HGB 15.4 07/25/2023   HCT 48.8 07/25/2023   MCV 91.7 07/25/2023   PLT 188 07/25/2023   Lab Results  Component Value Date   CREATININE 1.34 (H) 07/25/2023   BUN 32 (H) 07/25/2023   NA 136 07/25/2023   K 4.2  07/25/2023  CL 95 (L) 07/25/2023   CO2 31 07/25/2023   Lab Results  Component Value Date   ALT 15 07/25/2023   AST 23 07/25/2023   ALKPHOS 68 07/25/2023   BILITOT 0.5 07/25/2023   Lab Results  Component Value Date   CHOL 179 06/10/2010   HDL 46 06/10/2010   LDLCALC 118 06/10/2010   TRIG 75 06/10/2010    No results found for: "HGBA1C"   Review of Prior Studies Echocardiogram 07/24/2023 EKG Interpretation Date/Time:  Friday August 04 2023 14:17:35 EDT Ventricular Rate:  64 PR Interval:  300 QRS Duration:  134 QT Interval:  466 QTC Calculation: 480 R Axis:   41  Text Interpretation: Atrial-paced rhythm with prolonged AV conduction Right bundle branch block T wave abnormality, consider inferolateral ischemia When compared with ECG of 25-Jul-2023 08:18, No significant change was found Confirmed by Joni Reining [phone]) on 08/04/2023 3:54:31 PM    1. Poor windows. very limited study. The EF is grossly normal.. Left  ventricular diastolic parameters are indeterminate.   2. Right ventricular systolic function was not well visualized. The right  ventricular size is not well visualized. Tricuspid regurgitation signal is  inadequate for assessing PA pressure.   3. The mitral valve was not well visualized. No evidence of mitral valve  regurgitation.   4. The aortic valve was not well visualized.   Assessment & Plan   1.  Chronic diastolic CHF: The patient is doing well and is euvolemic today.  He is maintaining his weight without any evidence of volume overload or increased weight since being discharged from the hospital.  He continues on Lasix as directed without skipping any doses.  He is getting good response from this Lasix dose.  He will continue low-sodium heart healthy diet.  His daughter is helping him with the [employer].  He will continue daily weights and report any weight gain of 3 to 5 pounds in 24 to 48 hours.  Consider repeating echocardiogram when seen again by  Dr. Antoine Poche in November for better use possibly using Doximity.  2.  Paroxysmal atrial fibrillation: Heart rate is well-controlled.  EKG reveals AV pacing.  Remains on Tikosyn followed by EP.  Continue Coumadin therapy he is being followed in the [place] office.  3.  Pacemaker in situ: Patient is followed by Dr. Graciela Husbands for [facility] pacemaker with remote checks and annual follow-ups.  4.  Pulmonary fibrosis: Followed by Dr. Donell Beers with pulmonology.  He remains on O2 at 2 L.  Ongoing management through pulmonology.       Signed, Bettey Mare. Liborio Nixon, ANP, AACC   08/04/2023 3:54 PM      Office [phone] Fax [phone]  Notice: This dictation was prepared with Dragon dictation along with smaller phrase technology. Any transcriptional errors that result from this process are unintentional and [name] not be corrected upon review.

## 2023-08-04 ENCOUNTER — Ambulatory Visit: Payer: Medicare Other | Attending: Adult Health | Admitting: Adult Health

## 2023-08-04 ENCOUNTER — Encounter: Payer: Self-pay | Admitting: Adult Health

## 2023-08-04 VITALS — BP 100/59 | HR 64 | Ht 69.0 in | Wt 233.2 lb

## 2023-08-04 DIAGNOSIS — I4821 Permanent atrial fibrillation: Secondary | ICD-10-CM | POA: Diagnosis not present

## 2023-08-04 DIAGNOSIS — Z950 Presence of cardiac pacemaker: Secondary | ICD-10-CM

## 2023-08-04 DIAGNOSIS — I5032 Chronic diastolic (congestive) heart failure: Secondary | ICD-10-CM | POA: Diagnosis not present

## 2023-08-04 DIAGNOSIS — J8410 Pulmonary fibrosis, unspecified: Secondary | ICD-10-CM | POA: Diagnosis not present

## 2023-08-04 DIAGNOSIS — G4733 Obstructive sleep apnea (adult) (pediatric): Secondary | ICD-10-CM | POA: Diagnosis not present

## 2023-08-04 NOTE — Patient Instructions (Signed)
Medication Instructions:  No Changes *If you need a refill on your cardiac medications before your next appointment, please call your pharmacy*   Lab Work: No Labs If you have labs (blood work) drawn today and your tests are completely normal, you will receive your results only by: MyChart Message (if you have MyChart) OR A paper copy in the mail If you have any lab test that is abnormal or we need to change your treatment, we will call you to review the results.   Testing/Procedures: No Testing   Follow-Up: At [facility], you and your health needs are our priority.  As part of our continuing mission to provide you with exceptional heart care, we have created designated Provider Care Teams.  These Care Teams include your primary Cardiologist (physician) and Advanced Practice Providers (APPs -  Physician Assistants and Nurse Practitioners) who all work together to provide you with the care you need, when you need it.  We recommend signing up for the patient portal called "MyChart".  Sign up information is provided on this After Visit Summary.  MyChart is used to connect with patients for Virtual Visits (Telemedicine).  Patients are able to view lab/test results, encounter notes, upcoming appointments, etc.  Non-urgent messages can be sent to your provider as well.   To learn more about what you can do with MyChart, go to [email or web address].    Your next appointment:   Keep Scheduled Appointment  Provider:   Rollene Rotunda, MD

## 2023-08-05 DIAGNOSIS — G4733 Obstructive sleep apnea (adult) (pediatric): Secondary | ICD-10-CM | POA: Diagnosis not present

## 2023-08-05 DIAGNOSIS — I4891 Unspecified atrial fibrillation: Secondary | ICD-10-CM | POA: Diagnosis not present

## 2023-08-08 ENCOUNTER — Ambulatory Visit: Payer: Medicare Other | Admitting: Podiatry

## 2023-08-08 DIAGNOSIS — J84112 Idiopathic pulmonary fibrosis: Secondary | ICD-10-CM | POA: Diagnosis not present

## 2023-08-08 DIAGNOSIS — G8929 Other chronic pain: Secondary | ICD-10-CM | POA: Diagnosis not present

## 2023-08-08 DIAGNOSIS — I5033 Acute on chronic diastolic (congestive) heart failure: Secondary | ICD-10-CM | POA: Diagnosis not present

## 2023-08-08 DIAGNOSIS — Z7901 Long term (current) use of anticoagulants: Secondary | ICD-10-CM | POA: Diagnosis not present

## 2023-08-08 DIAGNOSIS — Z556 Problems related to health literacy: Secondary | ICD-10-CM | POA: Diagnosis not present

## 2023-08-08 DIAGNOSIS — J9621 Acute and chronic respiratory failure with hypoxia: Secondary | ICD-10-CM | POA: Diagnosis not present

## 2023-08-08 DIAGNOSIS — M545 Low back pain, unspecified: Secondary | ICD-10-CM | POA: Diagnosis not present

## 2023-08-08 DIAGNOSIS — Z9981 Dependence on supplemental oxygen: Secondary | ICD-10-CM | POA: Diagnosis not present

## 2023-08-08 DIAGNOSIS — N1831 Chronic kidney disease, stage 3a: Secondary | ICD-10-CM | POA: Diagnosis not present

## 2023-08-08 DIAGNOSIS — G4733 Obstructive sleep apnea (adult) (pediatric): Secondary | ICD-10-CM | POA: Diagnosis not present

## 2023-08-08 DIAGNOSIS — J42 Unspecified chronic bronchitis: Secondary | ICD-10-CM | POA: Diagnosis not present

## 2023-08-08 DIAGNOSIS — I4819 Other persistent atrial fibrillation: Secondary | ICD-10-CM | POA: Diagnosis not present

## 2023-08-08 DIAGNOSIS — D72829 Elevated white blood cell count, unspecified: Secondary | ICD-10-CM | POA: Diagnosis not present

## 2023-08-08 DIAGNOSIS — Z8551 Personal history of malignant neoplasm of bladder: Secondary | ICD-10-CM | POA: Diagnosis not present

## 2023-08-08 DIAGNOSIS — N179 Acute kidney failure, unspecified: Secondary | ICD-10-CM | POA: Diagnosis not present

## 2023-08-08 DIAGNOSIS — I130 Hypertensive heart and chronic kidney disease with heart failure and stage 1 through stage 4 chronic kidney disease, or unspecified chronic kidney disease: Secondary | ICD-10-CM | POA: Diagnosis not present

## 2023-08-15 DIAGNOSIS — J84112 Idiopathic pulmonary fibrosis: Secondary | ICD-10-CM | POA: Diagnosis not present

## 2023-08-15 DIAGNOSIS — I5033 Acute on chronic diastolic (congestive) heart failure: Secondary | ICD-10-CM | POA: Diagnosis not present

## 2023-08-15 DIAGNOSIS — Z556 Problems related to health literacy: Secondary | ICD-10-CM | POA: Diagnosis not present

## 2023-08-15 DIAGNOSIS — I4819 Other persistent atrial fibrillation: Secondary | ICD-10-CM | POA: Diagnosis not present

## 2023-08-15 DIAGNOSIS — Z7901 Long term (current) use of anticoagulants: Secondary | ICD-10-CM | POA: Diagnosis not present

## 2023-08-15 DIAGNOSIS — I130 Hypertensive heart and chronic kidney disease with heart failure and stage 1 through stage 4 chronic kidney disease, or unspecified chronic kidney disease: Secondary | ICD-10-CM | POA: Diagnosis not present

## 2023-08-15 DIAGNOSIS — M545 Low back pain, unspecified: Secondary | ICD-10-CM | POA: Diagnosis not present

## 2023-08-15 DIAGNOSIS — G8929 Other chronic pain: Secondary | ICD-10-CM | POA: Diagnosis not present

## 2023-08-15 DIAGNOSIS — N179 Acute kidney failure, unspecified: Secondary | ICD-10-CM | POA: Diagnosis not present

## 2023-08-15 DIAGNOSIS — Z8551 Personal history of malignant neoplasm of bladder: Secondary | ICD-10-CM | POA: Diagnosis not present

## 2023-08-15 DIAGNOSIS — N1831 Chronic kidney disease, stage 3a: Secondary | ICD-10-CM | POA: Diagnosis not present

## 2023-08-15 DIAGNOSIS — J9621 Acute and chronic respiratory failure with hypoxia: Secondary | ICD-10-CM | POA: Diagnosis not present

## 2023-08-15 DIAGNOSIS — Z9981 Dependence on supplemental oxygen: Secondary | ICD-10-CM | POA: Diagnosis not present

## 2023-08-15 DIAGNOSIS — D72829 Elevated white blood cell count, unspecified: Secondary | ICD-10-CM | POA: Diagnosis not present

## 2023-08-15 DIAGNOSIS — J42 Unspecified chronic bronchitis: Secondary | ICD-10-CM | POA: Diagnosis not present

## 2023-08-15 DIAGNOSIS — G4733 Obstructive sleep apnea (adult) (pediatric): Secondary | ICD-10-CM | POA: Diagnosis not present

## 2023-08-18 DIAGNOSIS — I130 Hypertensive heart and chronic kidney disease with heart failure and stage 1 through stage 4 chronic kidney disease, or unspecified chronic kidney disease: Secondary | ICD-10-CM | POA: Diagnosis not present

## 2023-08-18 DIAGNOSIS — N179 Acute kidney failure, unspecified: Secondary | ICD-10-CM | POA: Diagnosis not present

## 2023-08-18 DIAGNOSIS — I5033 Acute on chronic diastolic (congestive) heart failure: Secondary | ICD-10-CM | POA: Diagnosis not present

## 2023-08-18 DIAGNOSIS — N1831 Chronic kidney disease, stage 3a: Secondary | ICD-10-CM | POA: Diagnosis not present

## 2023-08-21 ENCOUNTER — Ambulatory Visit (INDEPENDENT_AMBULATORY_CARE_PROVIDER_SITE_OTHER): Payer: Medicare Other

## 2023-08-21 DIAGNOSIS — I4821 Permanent atrial fibrillation: Secondary | ICD-10-CM

## 2023-08-21 DIAGNOSIS — I495 Sick sinus syndrome: Secondary | ICD-10-CM | POA: Diagnosis not present

## 2023-08-21 LAB — CUP PACEART REMOTE DEVICE CHECK
Battery Remaining Longevity: 47 mo
Battery Remaining Percentage: 42 %
Battery Voltage: 2.98 V
Brady Statistic AP VP Percent: 6.5 %
Brady Statistic AP VS Percent: 77 %
Brady Statistic AS VP Percent: 1 %
Brady Statistic AS VS Percent: 15 %
Brady Statistic RA Percent Paced: 80 %
Brady Statistic RV Percent Paced: 6.9 %
Date Time Interrogation Session: 20241007020018
Implantable Lead Connection Status: 753985
Implantable Lead Connection Status: 753985
Implantable Lead Implant Date: 20180808
Implantable Lead Implant Date: 20180808
Implantable Lead Location: 753859
Implantable Lead Location: 753860
Implantable Lead Model: 5076
Implantable Lead Model: 5076
Implantable Pulse Generator Implant Date: 20180808
Lead Channel Impedance Value: 450 Ohm
Lead Channel Impedance Value: 480 Ohm
Lead Channel Pacing Threshold Amplitude: 0.75 V
Lead Channel Pacing Threshold Amplitude: 0.75 V
Lead Channel Pacing Threshold Pulse Width: 0.5 ms
Lead Channel Pacing Threshold Pulse Width: 0.5 ms
Lead Channel Sensing Intrinsic Amplitude: 12 mV
Lead Channel Sensing Intrinsic Amplitude: 4 mV
Lead Channel Setting Pacing Amplitude: 2 V
Lead Channel Setting Pacing Amplitude: 2.5 V
Lead Channel Setting Pacing Pulse Width: 0.5 ms
Lead Channel Setting Sensing Sensitivity: 2 mV
Pulse Gen Model: 2272
Pulse Gen Serial Number: 8932564

## 2023-08-22 DIAGNOSIS — J9621 Acute and chronic respiratory failure with hypoxia: Secondary | ICD-10-CM | POA: Diagnosis not present

## 2023-08-22 DIAGNOSIS — J42 Unspecified chronic bronchitis: Secondary | ICD-10-CM | POA: Diagnosis not present

## 2023-08-22 DIAGNOSIS — I5033 Acute on chronic diastolic (congestive) heart failure: Secondary | ICD-10-CM | POA: Diagnosis not present

## 2023-08-22 DIAGNOSIS — M545 Low back pain, unspecified: Secondary | ICD-10-CM | POA: Diagnosis not present

## 2023-08-22 DIAGNOSIS — I4819 Other persistent atrial fibrillation: Secondary | ICD-10-CM | POA: Diagnosis not present

## 2023-08-22 DIAGNOSIS — Z8551 Personal history of malignant neoplasm of bladder: Secondary | ICD-10-CM | POA: Diagnosis not present

## 2023-08-22 DIAGNOSIS — D72829 Elevated white blood cell count, unspecified: Secondary | ICD-10-CM | POA: Diagnosis not present

## 2023-08-22 DIAGNOSIS — Z556 Problems related to health literacy: Secondary | ICD-10-CM | POA: Diagnosis not present

## 2023-08-22 DIAGNOSIS — G8929 Other chronic pain: Secondary | ICD-10-CM | POA: Diagnosis not present

## 2023-08-22 DIAGNOSIS — N179 Acute kidney failure, unspecified: Secondary | ICD-10-CM | POA: Diagnosis not present

## 2023-08-22 DIAGNOSIS — J84112 Idiopathic pulmonary fibrosis: Secondary | ICD-10-CM | POA: Diagnosis not present

## 2023-08-22 DIAGNOSIS — Z7901 Long term (current) use of anticoagulants: Secondary | ICD-10-CM | POA: Diagnosis not present

## 2023-08-22 DIAGNOSIS — Z9981 Dependence on supplemental oxygen: Secondary | ICD-10-CM | POA: Diagnosis not present

## 2023-08-22 DIAGNOSIS — N1831 Chronic kidney disease, stage 3a: Secondary | ICD-10-CM | POA: Diagnosis not present

## 2023-08-22 DIAGNOSIS — G4733 Obstructive sleep apnea (adult) (pediatric): Secondary | ICD-10-CM | POA: Diagnosis not present

## 2023-08-22 DIAGNOSIS — I130 Hypertensive heart and chronic kidney disease with heart failure and stage 1 through stage 4 chronic kidney disease, or unspecified chronic kidney disease: Secondary | ICD-10-CM | POA: Diagnosis not present

## 2023-08-26 DIAGNOSIS — Z950 Presence of cardiac pacemaker: Secondary | ICD-10-CM | POA: Diagnosis not present

## 2023-08-26 DIAGNOSIS — Z9981 Dependence on supplemental oxygen: Secondary | ICD-10-CM | POA: Diagnosis not present

## 2023-08-26 DIAGNOSIS — I130 Hypertensive heart and chronic kidney disease with heart failure and stage 1 through stage 4 chronic kidney disease, or unspecified chronic kidney disease: Secondary | ICD-10-CM | POA: Diagnosis not present

## 2023-08-26 DIAGNOSIS — Z7901 Long term (current) use of anticoagulants: Secondary | ICD-10-CM | POA: Diagnosis not present

## 2023-08-26 DIAGNOSIS — J42 Unspecified chronic bronchitis: Secondary | ICD-10-CM | POA: Diagnosis not present

## 2023-08-26 DIAGNOSIS — J9621 Acute and chronic respiratory failure with hypoxia: Secondary | ICD-10-CM | POA: Diagnosis not present

## 2023-08-26 DIAGNOSIS — I5033 Acute on chronic diastolic (congestive) heart failure: Secondary | ICD-10-CM | POA: Diagnosis not present

## 2023-08-26 DIAGNOSIS — M545 Low back pain, unspecified: Secondary | ICD-10-CM | POA: Diagnosis not present

## 2023-08-26 DIAGNOSIS — J84112 Idiopathic pulmonary fibrosis: Secondary | ICD-10-CM | POA: Diagnosis not present

## 2023-08-26 DIAGNOSIS — Z556 Problems related to health literacy: Secondary | ICD-10-CM | POA: Diagnosis not present

## 2023-08-26 DIAGNOSIS — Z8551 Personal history of malignant neoplasm of bladder: Secondary | ICD-10-CM | POA: Diagnosis not present

## 2023-08-26 DIAGNOSIS — Z87891 Personal history of nicotine dependence: Secondary | ICD-10-CM | POA: Diagnosis not present

## 2023-08-26 DIAGNOSIS — I4819 Other persistent atrial fibrillation: Secondary | ICD-10-CM | POA: Diagnosis not present

## 2023-08-26 DIAGNOSIS — N179 Acute kidney failure, unspecified: Secondary | ICD-10-CM | POA: Diagnosis not present

## 2023-08-26 DIAGNOSIS — N1831 Chronic kidney disease, stage 3a: Secondary | ICD-10-CM | POA: Diagnosis not present

## 2023-08-26 DIAGNOSIS — G8929 Other chronic pain: Secondary | ICD-10-CM | POA: Diagnosis not present

## 2023-08-26 DIAGNOSIS — G4733 Obstructive sleep apnea (adult) (pediatric): Secondary | ICD-10-CM | POA: Diagnosis not present

## 2023-08-26 DIAGNOSIS — D72829 Elevated white blood cell count, unspecified: Secondary | ICD-10-CM | POA: Diagnosis not present

## 2023-08-29 DIAGNOSIS — Z9981 Dependence on supplemental oxygen: Secondary | ICD-10-CM | POA: Diagnosis not present

## 2023-08-29 DIAGNOSIS — J42 Unspecified chronic bronchitis: Secondary | ICD-10-CM | POA: Diagnosis not present

## 2023-08-29 DIAGNOSIS — J84112 Idiopathic pulmonary fibrosis: Secondary | ICD-10-CM | POA: Diagnosis not present

## 2023-08-29 DIAGNOSIS — M545 Low back pain, unspecified: Secondary | ICD-10-CM | POA: Diagnosis not present

## 2023-08-29 DIAGNOSIS — N1831 Chronic kidney disease, stage 3a: Secondary | ICD-10-CM | POA: Diagnosis not present

## 2023-08-29 DIAGNOSIS — I130 Hypertensive heart and chronic kidney disease with heart failure and stage 1 through stage 4 chronic kidney disease, or unspecified chronic kidney disease: Secondary | ICD-10-CM | POA: Diagnosis not present

## 2023-08-29 DIAGNOSIS — I5033 Acute on chronic diastolic (congestive) heart failure: Secondary | ICD-10-CM | POA: Diagnosis not present

## 2023-08-29 DIAGNOSIS — G8929 Other chronic pain: Secondary | ICD-10-CM | POA: Diagnosis not present

## 2023-08-29 DIAGNOSIS — Z556 Problems related to health literacy: Secondary | ICD-10-CM | POA: Diagnosis not present

## 2023-08-29 DIAGNOSIS — I4819 Other persistent atrial fibrillation: Secondary | ICD-10-CM | POA: Diagnosis not present

## 2023-08-29 DIAGNOSIS — D72829 Elevated white blood cell count, unspecified: Secondary | ICD-10-CM | POA: Diagnosis not present

## 2023-08-29 DIAGNOSIS — G4733 Obstructive sleep apnea (adult) (pediatric): Secondary | ICD-10-CM | POA: Diagnosis not present

## 2023-08-29 DIAGNOSIS — J9621 Acute and chronic respiratory failure with hypoxia: Secondary | ICD-10-CM | POA: Diagnosis not present

## 2023-08-29 DIAGNOSIS — Z8551 Personal history of malignant neoplasm of bladder: Secondary | ICD-10-CM | POA: Diagnosis not present

## 2023-08-29 DIAGNOSIS — N179 Acute kidney failure, unspecified: Secondary | ICD-10-CM | POA: Diagnosis not present

## 2023-08-29 DIAGNOSIS — Z7901 Long term (current) use of anticoagulants: Secondary | ICD-10-CM | POA: Diagnosis not present

## 2023-09-01 NOTE — Progress Notes (Signed)
Remote pacemaker transmission.   

## 2023-09-03 DIAGNOSIS — G4733 Obstructive sleep apnea (adult) (pediatric): Secondary | ICD-10-CM | POA: Diagnosis not present

## 2023-09-04 DIAGNOSIS — G4733 Obstructive sleep apnea (adult) (pediatric): Secondary | ICD-10-CM | POA: Diagnosis not present

## 2023-09-04 DIAGNOSIS — I4891 Unspecified atrial fibrillation: Secondary | ICD-10-CM | POA: Diagnosis not present

## 2023-09-05 DIAGNOSIS — J84112 Idiopathic pulmonary fibrosis: Secondary | ICD-10-CM | POA: Diagnosis not present

## 2023-09-05 DIAGNOSIS — J42 Unspecified chronic bronchitis: Secondary | ICD-10-CM | POA: Diagnosis not present

## 2023-09-05 DIAGNOSIS — M545 Low back pain, unspecified: Secondary | ICD-10-CM | POA: Diagnosis not present

## 2023-09-05 DIAGNOSIS — D72829 Elevated white blood cell count, unspecified: Secondary | ICD-10-CM | POA: Diagnosis not present

## 2023-09-05 DIAGNOSIS — N1831 Chronic kidney disease, stage 3a: Secondary | ICD-10-CM | POA: Diagnosis not present

## 2023-09-05 DIAGNOSIS — Z7901 Long term (current) use of anticoagulants: Secondary | ICD-10-CM | POA: Diagnosis not present

## 2023-09-05 DIAGNOSIS — Z8551 Personal history of malignant neoplasm of bladder: Secondary | ICD-10-CM | POA: Diagnosis not present

## 2023-09-05 DIAGNOSIS — Z556 Problems related to health literacy: Secondary | ICD-10-CM | POA: Diagnosis not present

## 2023-09-05 DIAGNOSIS — Z9981 Dependence on supplemental oxygen: Secondary | ICD-10-CM | POA: Diagnosis not present

## 2023-09-05 DIAGNOSIS — I4819 Other persistent atrial fibrillation: Secondary | ICD-10-CM | POA: Diagnosis not present

## 2023-09-05 DIAGNOSIS — G4733 Obstructive sleep apnea (adult) (pediatric): Secondary | ICD-10-CM | POA: Diagnosis not present

## 2023-09-05 DIAGNOSIS — N179 Acute kidney failure, unspecified: Secondary | ICD-10-CM | POA: Diagnosis not present

## 2023-09-05 DIAGNOSIS — I5033 Acute on chronic diastolic (congestive) heart failure: Secondary | ICD-10-CM | POA: Diagnosis not present

## 2023-09-05 DIAGNOSIS — J9621 Acute and chronic respiratory failure with hypoxia: Secondary | ICD-10-CM | POA: Diagnosis not present

## 2023-09-05 DIAGNOSIS — G8929 Other chronic pain: Secondary | ICD-10-CM | POA: Diagnosis not present

## 2023-09-05 DIAGNOSIS — I130 Hypertensive heart and chronic kidney disease with heart failure and stage 1 through stage 4 chronic kidney disease, or unspecified chronic kidney disease: Secondary | ICD-10-CM | POA: Diagnosis not present

## 2023-09-07 ENCOUNTER — Ambulatory Visit: Payer: Medicare Other | Attending: Cardiology | Admitting: *Deleted

## 2023-09-07 DIAGNOSIS — Z5181 Encounter for therapeutic drug level monitoring: Secondary | ICD-10-CM | POA: Diagnosis not present

## 2023-09-07 DIAGNOSIS — I4819 Other persistent atrial fibrillation: Secondary | ICD-10-CM

## 2023-09-07 LAB — POCT INR: INR: 2.1 (ref 2.0–3.0)

## 2023-09-07 NOTE — Patient Instructions (Signed)
Continue warfarin 1/2 tablet daily  Recheck in 6 weeks  

## 2023-09-08 DIAGNOSIS — Z515 Encounter for palliative care: Secondary | ICD-10-CM | POA: Diagnosis not present

## 2023-09-08 DIAGNOSIS — J8410 Pulmonary fibrosis, unspecified: Secondary | ICD-10-CM | POA: Diagnosis not present

## 2023-09-08 DIAGNOSIS — I5032 Chronic diastolic (congestive) heart failure: Secondary | ICD-10-CM | POA: Diagnosis not present

## 2023-09-13 DIAGNOSIS — N1831 Chronic kidney disease, stage 3a: Secondary | ICD-10-CM | POA: Diagnosis not present

## 2023-09-13 DIAGNOSIS — G4733 Obstructive sleep apnea (adult) (pediatric): Secondary | ICD-10-CM | POA: Diagnosis not present

## 2023-09-13 DIAGNOSIS — G8929 Other chronic pain: Secondary | ICD-10-CM | POA: Diagnosis not present

## 2023-09-13 DIAGNOSIS — M545 Low back pain, unspecified: Secondary | ICD-10-CM | POA: Diagnosis not present

## 2023-09-13 DIAGNOSIS — Z556 Problems related to health literacy: Secondary | ICD-10-CM | POA: Diagnosis not present

## 2023-09-13 DIAGNOSIS — Z9981 Dependence on supplemental oxygen: Secondary | ICD-10-CM | POA: Diagnosis not present

## 2023-09-13 DIAGNOSIS — D72829 Elevated white blood cell count, unspecified: Secondary | ICD-10-CM | POA: Diagnosis not present

## 2023-09-13 DIAGNOSIS — J9621 Acute and chronic respiratory failure with hypoxia: Secondary | ICD-10-CM | POA: Diagnosis not present

## 2023-09-13 DIAGNOSIS — J42 Unspecified chronic bronchitis: Secondary | ICD-10-CM | POA: Diagnosis not present

## 2023-09-13 DIAGNOSIS — N179 Acute kidney failure, unspecified: Secondary | ICD-10-CM | POA: Diagnosis not present

## 2023-09-13 DIAGNOSIS — I4819 Other persistent atrial fibrillation: Secondary | ICD-10-CM | POA: Diagnosis not present

## 2023-09-13 DIAGNOSIS — I5033 Acute on chronic diastolic (congestive) heart failure: Secondary | ICD-10-CM | POA: Diagnosis not present

## 2023-09-13 DIAGNOSIS — J84112 Idiopathic pulmonary fibrosis: Secondary | ICD-10-CM | POA: Diagnosis not present

## 2023-09-13 DIAGNOSIS — I130 Hypertensive heart and chronic kidney disease with heart failure and stage 1 through stage 4 chronic kidney disease, or unspecified chronic kidney disease: Secondary | ICD-10-CM | POA: Diagnosis not present

## 2023-09-13 DIAGNOSIS — Z8551 Personal history of malignant neoplasm of bladder: Secondary | ICD-10-CM | POA: Diagnosis not present

## 2023-09-13 DIAGNOSIS — Z7901 Long term (current) use of anticoagulants: Secondary | ICD-10-CM | POA: Diagnosis not present

## 2023-09-20 DIAGNOSIS — Z8551 Personal history of malignant neoplasm of bladder: Secondary | ICD-10-CM | POA: Diagnosis not present

## 2023-09-20 DIAGNOSIS — D72829 Elevated white blood cell count, unspecified: Secondary | ICD-10-CM | POA: Diagnosis not present

## 2023-09-20 DIAGNOSIS — N179 Acute kidney failure, unspecified: Secondary | ICD-10-CM | POA: Diagnosis not present

## 2023-09-20 DIAGNOSIS — J9621 Acute and chronic respiratory failure with hypoxia: Secondary | ICD-10-CM | POA: Diagnosis not present

## 2023-09-20 DIAGNOSIS — I5033 Acute on chronic diastolic (congestive) heart failure: Secondary | ICD-10-CM | POA: Diagnosis not present

## 2023-09-20 DIAGNOSIS — G4733 Obstructive sleep apnea (adult) (pediatric): Secondary | ICD-10-CM | POA: Diagnosis not present

## 2023-09-20 DIAGNOSIS — I4819 Other persistent atrial fibrillation: Secondary | ICD-10-CM | POA: Diagnosis not present

## 2023-09-20 DIAGNOSIS — Z556 Problems related to health literacy: Secondary | ICD-10-CM | POA: Diagnosis not present

## 2023-09-20 DIAGNOSIS — N1831 Chronic kidney disease, stage 3a: Secondary | ICD-10-CM | POA: Diagnosis not present

## 2023-09-20 DIAGNOSIS — G8929 Other chronic pain: Secondary | ICD-10-CM | POA: Diagnosis not present

## 2023-09-20 DIAGNOSIS — Z7901 Long term (current) use of anticoagulants: Secondary | ICD-10-CM | POA: Diagnosis not present

## 2023-09-20 DIAGNOSIS — J84112 Idiopathic pulmonary fibrosis: Secondary | ICD-10-CM | POA: Diagnosis not present

## 2023-09-20 DIAGNOSIS — I130 Hypertensive heart and chronic kidney disease with heart failure and stage 1 through stage 4 chronic kidney disease, or unspecified chronic kidney disease: Secondary | ICD-10-CM | POA: Diagnosis not present

## 2023-09-20 DIAGNOSIS — Z9981 Dependence on supplemental oxygen: Secondary | ICD-10-CM | POA: Diagnosis not present

## 2023-09-20 DIAGNOSIS — J42 Unspecified chronic bronchitis: Secondary | ICD-10-CM | POA: Diagnosis not present

## 2023-09-20 DIAGNOSIS — M545 Low back pain, unspecified: Secondary | ICD-10-CM | POA: Diagnosis not present

## 2023-10-04 DIAGNOSIS — G4733 Obstructive sleep apnea (adult) (pediatric): Secondary | ICD-10-CM | POA: Diagnosis not present

## 2023-10-05 DIAGNOSIS — G4733 Obstructive sleep apnea (adult) (pediatric): Secondary | ICD-10-CM | POA: Diagnosis not present

## 2023-10-05 DIAGNOSIS — I4891 Unspecified atrial fibrillation: Secondary | ICD-10-CM | POA: Diagnosis not present

## 2023-10-05 NOTE — Progress Notes (Signed)
Cardiology Office Note:   Date:  10/06/2023  ID:  Chad [name], DOB 1929-01-18, MRN [id number] PCP: [name]  [facility] Providers Cardiologist:  Rollene Rotunda, MD {  History of Present Illness:   Chad [name] is a 89 y.o. male who presents for follow up of atrial fib.   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  He has been treated for SVT as well by Dr. Graciela Husbands.    He is followed in the Atrial Fib clinic.  He is being treated with Tikosyn.   He has had subdural hematoma from a fall earlier in 2024.  He has done well since I last saw him.  The last head CT and this seems to be improved with smaller hematomas.    Recently discharged on 07/25/2023 for respiratory failure, and diastolic CHF.  Discharged home on O2 2/L , has been diuresed, sent home on lasix 20 mg BID. Continue CPAP at night.  The patient is now being followed by pulmonary palliative care.   Echocardiogram dated 07/24/2023 revealed poor windows with a very limited study, the EF was found to be normal.   Since going home he is actually done relatively well.  He walks with a walker.  He lives by himself.  He is actually still driving.  He went to the grocery store today.  He is denying any new shortness of breath, PND or orthopnea.  He is not having any new palpitations, presyncope or syncope.  He has as needed Cardizem if he has any episodes of palpitations and he has not had to use this.  He denies any chest pressure, neck or arm discomfort.  He has an oxygen concentrator which he uses as needed.  He is watching his salt.  He is weighing himself daily.  He is not having any falls.  He said no problems with his blood thinner and gets this regulated routinely.  He did have hospice or palliative care at 1 point in time it does not sound like he has this now.  ROS: As stated in the HPI and negative for all other systems.  Studies Reviewed:    EKG:   NA   Risk  Assessment/Calculations:    CHA2DS2-VASc Score = 5   This indicates a 7.2% annual risk of stroke. The patient's score is based upon: CHF History: 1 HTN History: 1 Diabetes History: 1 Stroke History: 0 Vascular Disease History: 0 Age Score: 2 Gender Score: 0    Physical Exam:   VS:  BP (!) 100/54 (BP Location: Left Arm, Patient Position: Sitting, Cuff Size: Large)   Pulse 64   Ht 5\' 9"  (1.753 m)   Wt 229 lb (103.9 kg)   SpO2 95% Comment: With Intranasal oxygen.  BMI 33.82 kg/m    Wt Readings from Last 3 Encounters:  10/06/23 229 lb (103.9 kg)  08/04/23 233 lb 3.2 oz (105.8 kg)  07/25/23 234 lb 12.8 oz (106.5 kg)     GEN: Well nourished, well developed in no acute distress NECK: No JVD; No carotid bruits CARDIAC: RRR, no murmurs, rubs, gallops RESPIRATORY:  Clear to auscultation without rales, wheezing or rhonchi  ABDOMEN: Soft, non-tender, non-distended EXTREMITIES:  No edema; No deformity   ASSESSMENT AND PLAN:   SOB:   This seems much improved.  No change in therapy.    ATRIAL FIB: He tolerates anticoagulation.  I think he is rhythm is fairly well-controlled on the Tikosyn and I am  going to continue this.  He sees Dr. Graciela Husbands soon after his birthday and can have follow-up blood work at that point.  He has 3 ventricular high rates that I have seen on his last device interrogation lasting longest 7 minutes.  Is not having any symptoms.  No change in therapy.   HTN: Blood pressure is at target.  No change in therapy.  HFpEF:   He seems to be euvolemic.  He can continue the meds as listed.   SLEEP APNEA:     Follow up with me in six months.   Signed, Rollene Rotunda, MD

## 2023-10-06 ENCOUNTER — Ambulatory Visit: Payer: Medicare Other | Admitting: Cardiology

## 2023-10-06 ENCOUNTER — Ambulatory Visit: Payer: Medicare Other | Attending: Cardiology | Admitting: Cardiology

## 2023-10-06 ENCOUNTER — Encounter: Payer: Self-pay | Admitting: Cardiology

## 2023-10-06 DIAGNOSIS — I480 Paroxysmal atrial fibrillation: Secondary | ICD-10-CM | POA: Diagnosis not present

## 2023-10-06 MED ORDER — DILTIAZEM HCL 30 MG PO TABS
ORAL_TABLET | ORAL | 3 refills | Status: AC
Start: 2023-10-06 — End: ?

## 2023-10-06 NOTE — Patient Instructions (Signed)
Medication Instructions:  Your physician recommends that you continue on your current medications as directed. Please refer to the Current Medication list given to you today.  *If you need a refill on your cardiac medications before your next appointment, please call your pharmacy*  Follow-Up: At [facility], you and your health needs are our priority.  As part of our continuing mission to provide you with exceptional heart care, we have created designated Provider Care Teams.  These Care Teams include your primary Cardiologist (physician) and Advanced Practice Providers (APPs -  Physician Assistants and Nurse Practitioners) who all work together to provide you with the care you need, when you need it.  We recommend signing up for the patient portal called "MyChart".  Sign up information is provided on this After Visit Summary.  MyChart is used to connect with patients for Virtual Visits (Telemedicine).  Patients are able to view lab/test results, encounter notes, upcoming appointments, etc.  Non-urgent messages can be sent to your provider as well.   To learn more about what you can do with MyChart, go to [email or web address].    Your next appointment:   6 month(s)  Provider:   Joni Reining, NP (or other APP if no availability)

## 2023-10-11 ENCOUNTER — Other Ambulatory Visit: Payer: Self-pay | Admitting: Cardiology

## 2023-10-13 ENCOUNTER — Other Ambulatory Visit: Payer: Self-pay | Admitting: Cardiology

## 2023-10-16 ENCOUNTER — Encounter: Payer: Self-pay | Admitting: Podiatry

## 2023-10-16 ENCOUNTER — Ambulatory Visit: Payer: Medicare Other | Admitting: Podiatry

## 2023-10-16 ENCOUNTER — Telehealth: Payer: Self-pay | Admitting: Cardiology

## 2023-10-16 DIAGNOSIS — B351 Tinea unguium: Secondary | ICD-10-CM | POA: Diagnosis not present

## 2023-10-16 DIAGNOSIS — D1801 Hemangioma of skin and subcutaneous tissue: Secondary | ICD-10-CM | POA: Diagnosis not present

## 2023-10-16 DIAGNOSIS — L814 Other melanin hyperpigmentation: Secondary | ICD-10-CM | POA: Diagnosis not present

## 2023-10-16 DIAGNOSIS — D044 Carcinoma in situ of skin of scalp and neck: Secondary | ICD-10-CM | POA: Diagnosis not present

## 2023-10-16 DIAGNOSIS — Z7901 Long term (current) use of anticoagulants: Secondary | ICD-10-CM

## 2023-10-16 DIAGNOSIS — M79674 Pain in right toe(s): Secondary | ICD-10-CM | POA: Diagnosis not present

## 2023-10-16 DIAGNOSIS — N481 Balanitis: Secondary | ICD-10-CM | POA: Diagnosis not present

## 2023-10-16 DIAGNOSIS — L578 Other skin changes due to chronic exposure to nonionizing radiation: Secondary | ICD-10-CM | POA: Diagnosis not present

## 2023-10-16 DIAGNOSIS — M79675 Pain in left toe(s): Secondary | ICD-10-CM

## 2023-10-16 MED ORDER — FUROSEMIDE 20 MG PO TABS: 20.0000 mg | ORAL_TABLET | Freq: Two times a day (BID) | ORAL | 3 refills | Status: AC

## 2023-10-16 NOTE — Progress Notes (Signed)
Subjective: Chief Complaint  Patient presents with   RFC    RM#[id number] rfc    89 y.o. returns the office today for painful, elongated, thickened toenails which he cannot trim himself.  No swelling redness or injury to the toenail sites.  No other concerns.  No open lesions.  He is on Coumadin  PCP: Assunta Found, MD  Objective: AAO 3, NAD DP/PT pulses palpable, CRT less than 3 seconds Nails hypertrophic, dystrophic, elongated, brittle, discolored 10.  Subungual debris present.  Incurvation present the hallux toenail left side worse than right.  No edema, erythema or signs of infection of the toenail sites. No open lesions or pre-ulcerative lesions are identified. No pain with calf compression, swelling, warmth, erythema.  Assessment: Patient presents with symptomatic onychomycosis; chronic anticoagulation  Plan: -Treatment options including alternatives, risks, complications were discussed -Nails sharply debrided 10 without complication/bleeding.   -Discussed daily foot inspection. If there are any changes, to call the office immediately.  -Follow-up in 3 months or sooner if any problems are to arise. In the meantime, encouraged to call the office with any questions, concerns, changes symptoms.  Ovid Curd, DPM

## 2023-10-16 NOTE — Telephone Encounter (Signed)
Patient's daughter stated she was unable to make it to the appointment on 10/06/23. Patient's daughter wanted Dr. Antoine Poche to be aware that she is with the patient for at least two weeks during the month and she has a sitter that will come clean and cook for the patient 5x during the week.

## 2023-10-16 NOTE — Telephone Encounter (Signed)
*  STAT* If patient is at the pharmacy, call can be transferred to refill team.   1. Which medications need to be refilled? (please list name of each medication and dose if known)   furosemide (LASIX) 20 MG tablet     2. Would you like to learn more about the convenience, safety, & potential cost savings by using the [facility]?No   3. Are you open to using the [facility] No   4. Which pharmacy/location (including street and city if local pharmacy) is medication to be sent to? [facility] - [place], [place] - [address]    5. Do they need a 30 day or 90 day supply?  90 Day Supply

## 2023-10-16 NOTE — Telephone Encounter (Signed)
Rx sent to [facility]

## 2023-10-17 ENCOUNTER — Ambulatory Visit: Payer: Medicare Other | Attending: Cardiology | Admitting: *Deleted

## 2023-10-17 DIAGNOSIS — Z5181 Encounter for therapeutic drug level monitoring: Secondary | ICD-10-CM | POA: Diagnosis not present

## 2023-10-17 DIAGNOSIS — Z515 Encounter for palliative care: Secondary | ICD-10-CM | POA: Diagnosis not present

## 2023-10-17 DIAGNOSIS — J8410 Pulmonary fibrosis, unspecified: Secondary | ICD-10-CM | POA: Diagnosis not present

## 2023-10-17 DIAGNOSIS — I5032 Chronic diastolic (congestive) heart failure: Secondary | ICD-10-CM | POA: Diagnosis not present

## 2023-10-17 DIAGNOSIS — I4819 Other persistent atrial fibrillation: Secondary | ICD-10-CM | POA: Diagnosis not present

## 2023-10-17 LAB — POCT INR: INR: 1.8 — AB (ref 2.0–3.0)

## 2023-10-17 NOTE — Patient Instructions (Signed)
Increase warfarin to 1/2 tablet daily except 1 tablet on Tuesdays Recheck in 6 weeks

## 2023-11-04 DIAGNOSIS — G4733 Obstructive sleep apnea (adult) (pediatric): Secondary | ICD-10-CM | POA: Diagnosis not present

## 2023-11-04 DIAGNOSIS — I4891 Unspecified atrial fibrillation: Secondary | ICD-10-CM | POA: Diagnosis not present

## 2023-11-09 ENCOUNTER — Other Ambulatory Visit: Payer: Self-pay | Admitting: Cardiology

## 2023-11-09 ENCOUNTER — Other Ambulatory Visit (HOSPITAL_COMMUNITY): Payer: Self-pay | Admitting: Physician Assistant

## 2023-11-09 DIAGNOSIS — H0015 Chalazion left lower eyelid: Secondary | ICD-10-CM | POA: Diagnosis not present

## 2023-11-09 DIAGNOSIS — C672 Malignant neoplasm of lateral wall of bladder: Secondary | ICD-10-CM | POA: Diagnosis not present

## 2023-11-09 DIAGNOSIS — H40013 Open angle with borderline findings, low risk, bilateral: Secondary | ICD-10-CM | POA: Diagnosis not present

## 2023-11-09 DIAGNOSIS — Z961 Presence of intraocular lens: Secondary | ICD-10-CM | POA: Diagnosis not present

## 2023-11-09 DIAGNOSIS — H0012 Chalazion right lower eyelid: Secondary | ICD-10-CM | POA: Diagnosis not present

## 2023-11-09 DIAGNOSIS — I4819 Other persistent atrial fibrillation: Secondary | ICD-10-CM

## 2023-11-20 ENCOUNTER — Ambulatory Visit: Payer: Medicare Other | Attending: Cardiology | Admitting: *Deleted

## 2023-11-20 ENCOUNTER — Ambulatory Visit (INDEPENDENT_AMBULATORY_CARE_PROVIDER_SITE_OTHER): Payer: Medicare Other

## 2023-11-20 DIAGNOSIS — I495 Sick sinus syndrome: Secondary | ICD-10-CM

## 2023-11-20 DIAGNOSIS — I4819 Other persistent atrial fibrillation: Secondary | ICD-10-CM | POA: Diagnosis not present

## 2023-11-20 DIAGNOSIS — Z5181 Encounter for therapeutic drug level monitoring: Secondary | ICD-10-CM | POA: Diagnosis not present

## 2023-11-20 LAB — CUP PACEART REMOTE DEVICE CHECK
Battery Remaining Longevity: 44 mo
Battery Remaining Percentage: 40 %
Battery Voltage: 2.96 V
Brady Statistic AP VP Percent: 11 %
Brady Statistic AP VS Percent: 74 %
Brady Statistic AS VP Percent: 1 %
Brady Statistic AS VS Percent: 13 %
Brady Statistic RA Percent Paced: 81 %
Brady Statistic RV Percent Paced: 12 %
Date Time Interrogation Session: 20250106025223
Implantable Lead Connection Status: 753985
Implantable Lead Connection Status: 753985
Implantable Lead Implant Date: 20180808
Implantable Lead Implant Date: 20180808
Implantable Lead Location: 753859
Implantable Lead Location: 753860
Implantable Lead Model: 5076
Implantable Lead Model: 5076
Implantable Pulse Generator Implant Date: 20180808
Lead Channel Impedance Value: 430 Ohm
Lead Channel Impedance Value: 450 Ohm
Lead Channel Pacing Threshold Amplitude: 0.75 V
Lead Channel Pacing Threshold Amplitude: 0.75 V
Lead Channel Pacing Threshold Pulse Width: 0.5 ms
Lead Channel Pacing Threshold Pulse Width: 0.5 ms
Lead Channel Sensing Intrinsic Amplitude: 12 mV
Lead Channel Sensing Intrinsic Amplitude: 2.9 mV
Lead Channel Setting Pacing Amplitude: 2 V
Lead Channel Setting Pacing Amplitude: 2.5 V
Lead Channel Setting Pacing Pulse Width: 0.5 ms
Lead Channel Setting Sensing Sensitivity: 2 mV
Pulse Gen Model: 2272
Pulse Gen Serial Number: 8932564

## 2023-11-20 LAB — POCT INR: INR: 2.6 (ref 2.0–3.0)

## 2023-11-20 NOTE — Patient Instructions (Signed)
Continue warfarin 1/2 tablet daily except 1 tablet on Tuesdays Recheck in 6 weeks

## 2023-11-28 DIAGNOSIS — Z515 Encounter for palliative care: Secondary | ICD-10-CM | POA: Diagnosis not present

## 2023-11-28 DIAGNOSIS — I5032 Chronic diastolic (congestive) heart failure: Secondary | ICD-10-CM | POA: Diagnosis not present

## 2023-11-28 DIAGNOSIS — J8410 Pulmonary fibrosis, unspecified: Secondary | ICD-10-CM | POA: Diagnosis not present

## 2023-12-05 DIAGNOSIS — G4733 Obstructive sleep apnea (adult) (pediatric): Secondary | ICD-10-CM | POA: Diagnosis not present

## 2023-12-05 DIAGNOSIS — I4891 Unspecified atrial fibrillation: Secondary | ICD-10-CM | POA: Diagnosis not present

## 2023-12-13 ENCOUNTER — Telehealth: Payer: Self-pay | Admitting: Cardiology

## 2023-12-13 NOTE — Telephone Encounter (Signed)
Called and spoke to patient's daughter. Below message relayed per Dr Antoine Poche. No questions at this time.   Rollene Rotunda, MD  Yes.  I would suggest the RSV vaccine.  Also, keep away from crowds and make sure anybody with cold or flu symptoms stays away

## 2023-12-13 NOTE — Telephone Encounter (Signed)
Pt's daughter wanting to know if pt should get the RSV injection for protection. No issues currently just asking bc he only got the flu shot

## 2023-12-19 ENCOUNTER — Ambulatory Visit: Payer: Medicare Other | Attending: Internal Medicine | Admitting: Internal Medicine

## 2023-12-19 ENCOUNTER — Encounter: Payer: Self-pay | Admitting: Internal Medicine

## 2023-12-19 VITALS — BP 116/70 | HR 63 | Ht 69.0 in | Wt 229.8 lb

## 2023-12-19 DIAGNOSIS — Z950 Presence of cardiac pacemaker: Secondary | ICD-10-CM | POA: Diagnosis not present

## 2023-12-19 DIAGNOSIS — Z79899 Other long term (current) drug therapy: Secondary | ICD-10-CM | POA: Diagnosis not present

## 2023-12-19 DIAGNOSIS — I4892 Unspecified atrial flutter: Secondary | ICD-10-CM

## 2023-12-19 DIAGNOSIS — I4819 Other persistent atrial fibrillation: Secondary | ICD-10-CM | POA: Diagnosis not present

## 2023-12-19 DIAGNOSIS — I4729 Other ventricular tachycardia: Secondary | ICD-10-CM

## 2023-12-19 NOTE — Progress Notes (Signed)
 Patient ID: Chad [name], male   DOB: 1929/08/28, 89 y.o.   MRN: [id number]      Patient Care Team: [facility], [facility] as PCP - General (Family Medicine) Lavona Agent, MD as PCP - Cardiology (Cardiology)     Chad [name] is a 89 y.o. male Seen in follow-up for pacing St Jude undertaken 2018 for sinus node dysfunction.  He also has atrial fibrillation, nonsustained ventricular tachycardia; anticoagulated with warfarin, (See Below)  significant bleeding.  Currently taking dofetilide -- no  interval atrial fibrillation    Fall  9/23 ECG read (I think in wrongly) to be in atrial fibrillation--atrial pacing with interpolated PACs; converted spontaneously, repeat fall   1/24 this time  subdural hematoma.  Given vitamin K .  Repeat scanning showed mild enlargement.  Coumadin  was held and tehn resumed per neurosurgery  Admitted 9/24 with worsening shortness of breath in the context of his pulmonary fibrosis, sleep apnea on CPAP.  Felt ultimately to be related to heart failure.  Being followed by palliative care for pulmonary  The patient denies chest pain, nocturnal dyspnea, orthopnea or peripheral edema.  There have been no palpitations, lightheadedness or syncope.  Complains of chronic dyspnea.  On oxygen .  His weight has been stable at his discharge weight from the hospital since September.  Chad [name]           DATE TEST EF   1/15 Echo   60-65 %   9/21 Echo  55-60%   11/21 Myoview   No ischemia  9/24 Echo  Grossly normal     Date Cr K Mg Hgb  7/18  1.16 4.5 2.2 15.2  11/18  1.06 4.5 2.3   3/20 1.12 4.4  14.0   10/20 1.11 4.4 2.3   8/21 1.07 5.0    9/21 1.34 4.2 2.2   5/22 1.31 4.2 2.5   1/24 1.27 4.0  15.1  9/24 1.22 4.5   16.7     Past Medical History:  Diagnosis Date   Bladder cancer (HCC) 2010   Chronic bronchitis (HCC)    Hypertension    Hypoglycemia    On home oxygen  therapy    2L w/CPAP at night (06/21/2017)   OSA on CPAP    w/2L O2 at night  (06/21/2017)   Persistent atrial fibrillation (HCC)        Pneumonia    when he was young; again in 2016 (06/21/2017)   Presence of permanent cardiac pacemaker    Sensory ataxia     Past Surgical History:  Procedure Laterality Date   BACK SURGERY     CARDIOVERSION N/A 04/23/2015   Procedure: CARDIOVERSION;  Surgeon: Vina Okey GAILS, MD;  Location: [facility] ENDOSCOPY;  Service: Cardiovascular;  Laterality: N/A;   CARDIOVERSION N/A 07/30/2015   Procedure: CARDIOVERSION;  Surgeon: Oneil JAYSON Parchment, MD;  Location: [facility] ENDOSCOPY;  Service: Cardiovascular;  Laterality: N/A;   EYE SURGERY Bilateral 2009   to lower pressure; no glaucoma (06/21/2017)   INSERT / REPLACE / REMOVE PACEMAKER  06/21/2017   LUMBAR DISC SURGERY  1987   PACEMAKER IMPLANT N/A 06/21/2017   Procedure: Pacemaker Implant;  Surgeon: Fernande Elspeth JAYSON, MD;  Location: [facility] INVASIVE CV LAB;  Service: Cardiovascular;  Laterality: N/A;   TRANSURETHRAL RESECTION OF BLADDER TUMOR WITH GYRUS (TURBT-GYRUS)  07/10/2009    Cystourethroscopy, gyrus TURBT (transurethral  resection of bladder tumor).thelbert 03/16/2011    Current Outpatient Medications  Medication Sig Dispense Refill   acetaminophen  (TYLENOL ) 650 MG CR tablet  Take 650 mg by mouth daily.     Cholecalciferol  (VITAMIN D3) 2000 units capsule Take 2,000 Units by mouth 2 (two) times daily.     diltiazem  (CARDIZEM ) 30 MG tablet TAKE 1 TABLET BY MOUTH EVERY 8 HOURS AS NEEDED 90 tablet 3   dofetilide  (TIKOSYN ) 250 MCG capsule Take 1 capsule by mouth twice daily 180 capsule 0   escitalopram  (LEXAPRO ) 10 MG tablet Take 10 mg by mouth at bedtime.     furosemide  (LASIX ) 20 MG tablet Take 1 tablet (20 mg total) by mouth 2 (two) times daily. 180 tablet 3   metoprolol  succinate (TOPROL -XL) 100 MG 24 hr tablet Take 1 tablet by mouth twice daily 180 tablet 0   Multiple Vitamins-Minerals (CENTRUM SILVER  PO) Take 1 tablet by mouth daily.     OXYGEN  Inhale 2 L into the lungs at bedtime. Uses with CPAP machine      triamcinolone ointment (KENALOG) 0.1 % Apply 1 Application topically in the morning and at bedtime.     vitamin B-12 (CYANOCOBALAMIN ) 500 MCG tablet Take 500 mcg by mouth daily.     warfarin (COUMADIN ) 5 MG tablet TAKE 1/2 TO 1 (ONE-HALF TO ONE) TABLET BY MOUTH ONCE DAILY AS  DIRECTED  BY  COUMADIN   CLINIC. 100 tablet 0   No current facility-administered medications for this visit.    Allergies  Allergen Reactions   Doxycycline  Hives   Amiodarone      Caused neuropathy   Carbapenems    Cephalexin  Other (See Comments)   Cephalosporins Other (See Comments)   Chlorpheniramine Other (See Comments)    Swells prostate    Ciprofloxacin     Pt can not take with amiodarone     Decongestant [Oxymetazoline]     All decongestant - makes prostate swell   Diltiazem  Swelling    Left leg swelling   Flagyl [Metronidazole] Nausea Only    Stomach ache   Hctz [Hydrochlorothiazide]    Levaquin [Levofloxacin In D5w] Other (See Comments)    unknown   Levofloxacin Nausea Only   Lidocaine      Pt unsure of   Penicillins Swelling   Teline [Tetracycline] Swelling    Hospitalization for 2 weeks, prostate swelling   Tetanus Toxoid Swelling    Swells arms, Please pre-medicate with benadryl.   Z-Pak [Azithromycin] Other (See Comments)    Pt can not take with Amiodarone     Sulfonamide Derivatives Rash   Toprol  Xl [Metoprolol  Tartrate] Itching and Rash    Pt states he tolerates.      Review of Systems negative except from HPI and PMH  Physical Exam: BP 116/70   Pulse 63   Ht 5' 9 (1.753 m)   Wt 229 lb 12.8 oz (104.2 kg)   SpO2 91%   BMI 33.94 kg/m  Well developed and well nourished in no acute distress HENT normal Neck supple with JVP-flat Clear Device pocket well healed; without hematoma or erythema.  There is no tethering  Regular rate and rhythm, no  gallop No  murmur Abd-soft with active BS No Clubbing cyanosis  edema Skin-warm and dry A & Oriented  Grossly normal sensory and motor  function  ECG atrial pacing with intrinsic conduction with right bundle branch block. Intervals 31/13/45 Device function is normal. Programming changes   See Paceart for details    Assessment and  Plan  VT-NS   Atrial fib  RVR  Atrial tachycardia-slow   Sinus bradycardia/1 AVB/RBBB   Pacemaker /St Jude   Morbidly obese  HFpEFchronic    Pulmonary fibrosis  Infrequent atrial fibrillation.  Continue dofetilide .  Will check a surveillance laboratories.  No bleeding.  Continue warfarin.  Volume status is stable.  Continue on the furosemide .  Conduction parameters are stable  No interval nonsustained VT; High VR episodes are atach

## 2023-12-19 NOTE — Patient Instructions (Signed)
Medication Instructions:  Your physician recommends that you continue on your current medications as directed. Please refer to the Current Medication list given to you today.  *If you need a refill on your cardiac medications before your next appointment, please call your pharmacy*   Lab Work:  BMET and Mg today If you have labs (blood work) drawn today and your tests are completely normal, you will receive your results only by: MyChart Message (if you have MyChart) OR A paper copy in the mail If you have any lab test that is abnormal or we need to change your treatment, we will call you to review the results.   Testing/Procedures: None ordered.    Follow-Up: At [facility], you and your health needs are our priority.  As part of our continuing mission to provide you with exceptional heart care, we have created designated Provider Care Teams.  These Care Teams include your primary Cardiologist (physician) and Advanced Practice Providers (APPs -  Physician Assistants and Nurse Practitioners) who all work together to provide you with the care you need, when you need it.  We recommend signing up for the patient portal called "MyChart".  Sign up information is provided on this After Visit Summary.  MyChart is used to connect with patients for Virtual Visits (Telemedicine).  Patients are able to view lab/test results, encounter notes, upcoming appointments, etc.  Non-urgent messages can be sent to your provider as well.   To learn more about what you can do with MyChart, go to [email or web address].    Your next appointment:   6 months with Dr Graciela Husbands

## 2023-12-20 LAB — BASIC METABOLIC PANEL
BUN/Creatinine Ratio: 16 (ref 10–24)
BUN: 19 mg/dL (ref 10–36)
CO2: 30 mmol/L — ABNORMAL HIGH (ref 20–29)
Calcium: 9.5 mg/dL (ref 8.6–10.2)
Chloride: 103 mmol/L (ref 96–106)
Creatinine, Ser: 1.16 mg/dL (ref 0.76–1.27)
Glucose: 88 mg/dL (ref 70–99)
Potassium: 4.5 mmol/L (ref 3.5–5.2)
Sodium: 143 mmol/L (ref 134–144)
eGFR: 59 mL/min/{1.73_m2} — ABNORMAL LOW (ref >59)

## 2023-12-20 LAB — MAGNESIUM: Magnesium: 2.3 mg/dL (ref 1.6–2.3)

## 2023-12-27 NOTE — Progress Notes (Signed)
Remote pacemaker transmission.

## 2023-12-31 LAB — CUP PACEART INCLINIC DEVICE CHECK
Battery Remaining Longevity: 43 mo
Brady Statistic RA Percent Paced: 0 %
Brady Statistic RV Percent Paced: 99.69 %
Date Time Interrogation Session: 20250204145403
HighPow Impedance: 66.375
Implantable Lead Connection Status: 753985
Implantable Lead Connection Status: 753985
Implantable Lead Implant Date: 20180808
Implantable Lead Implant Date: 20180808
Implantable Lead Location: 753859
Implantable Lead Location: 753860
Implantable Lead Model: 5076
Implantable Lead Model: 5076
Implantable Pulse Generator Implant Date: 20180808
Lead Channel Impedance Value: 387.5 Ohm
Lead Channel Impedance Value: 450 Ohm
Lead Channel Impedance Value: 950 Ohm
Lead Channel Pacing Threshold Amplitude: 0.75 V
Lead Channel Pacing Threshold Amplitude: 0.75 V
Lead Channel Pacing Threshold Amplitude: 1.25 V
Lead Channel Pacing Threshold Amplitude: 1.25 V
Lead Channel Pacing Threshold Pulse Width: 0.5 ms
Lead Channel Pacing Threshold Pulse Width: 0.5 ms
Lead Channel Pacing Threshold Pulse Width: 1 ms
Lead Channel Pacing Threshold Pulse Width: 1 ms
Lead Channel Sensing Intrinsic Amplitude: 0.3 mV
Lead Channel Sensing Intrinsic Amplitude: 11.9 mV
Lead Channel Setting Pacing Amplitude: 2.5 V
Lead Channel Setting Pacing Amplitude: 2.5 V
Lead Channel Setting Pacing Pulse Width: 0.5 ms
Lead Channel Setting Pacing Pulse Width: 1 ms
Lead Channel Setting Sensing Sensitivity: 0.5 mV
Pulse Gen Model: 2272
Pulse Gen Serial Number: 8932564

## 2024-01-01 ENCOUNTER — Ambulatory Visit: Payer: Medicare Other | Attending: Cardiology | Admitting: *Deleted

## 2024-01-01 DIAGNOSIS — Z5181 Encounter for therapeutic drug level monitoring: Secondary | ICD-10-CM | POA: Diagnosis not present

## 2024-01-01 DIAGNOSIS — I4819 Other persistent atrial fibrillation: Secondary | ICD-10-CM | POA: Diagnosis not present

## 2024-01-01 LAB — POCT INR: INR: 1.9 — AB (ref 2.0–3.0)

## 2024-01-01 NOTE — Patient Instructions (Signed)
 Take warfarin 1 tablet tonight then resume 1/2 tablet daily except 1 tablet on Tuesdays Recheck in 4 weeks

## 2024-01-05 DIAGNOSIS — G4733 Obstructive sleep apnea (adult) (pediatric): Secondary | ICD-10-CM | POA: Diagnosis not present

## 2024-01-05 DIAGNOSIS — I4891 Unspecified atrial fibrillation: Secondary | ICD-10-CM | POA: Diagnosis not present

## 2024-01-12 ENCOUNTER — Other Ambulatory Visit: Payer: Self-pay | Admitting: Cardiology

## 2024-01-15 ENCOUNTER — Ambulatory Visit: Payer: Medicare Other | Admitting: Podiatry

## 2024-01-22 ENCOUNTER — Encounter: Payer: Self-pay | Admitting: Primary Care

## 2024-01-22 ENCOUNTER — Ambulatory Visit: Payer: Medicare Other | Admitting: Primary Care

## 2024-01-22 ENCOUNTER — Telehealth: Payer: Self-pay

## 2024-01-22 VITALS — BP 128/60 | HR 63 | Temp 97.5°F | Ht 69.0 in | Wt 228.0 lb

## 2024-01-22 DIAGNOSIS — I509 Heart failure, unspecified: Secondary | ICD-10-CM | POA: Diagnosis not present

## 2024-01-22 DIAGNOSIS — J849 Interstitial pulmonary disease, unspecified: Secondary | ICD-10-CM | POA: Diagnosis not present

## 2024-01-22 DIAGNOSIS — G4733 Obstructive sleep apnea (adult) (pediatric): Secondary | ICD-10-CM | POA: Diagnosis not present

## 2024-01-22 DIAGNOSIS — J9611 Chronic respiratory failure with hypoxia: Secondary | ICD-10-CM

## 2024-01-22 DIAGNOSIS — Z87891 Personal history of nicotine dependence: Secondary | ICD-10-CM

## 2024-01-22 DIAGNOSIS — Z9981 Dependence on supplemental oxygen: Secondary | ICD-10-CM

## 2024-01-22 NOTE — Patient Instructions (Incomplete)
Please continue using your ASV regularly. While your insurance requires that you use ASV at least 4 hours each night on 70% of the nights, I recommend, that you not skip any nights and use it throughout the night if you can. Getting used to ASV and staying with the treatment long term does take time and patience and discipline. Untreated obstructive sleep apnea when it is moderate to severe can have an adverse impact on cardiovascular health and raise her risk for heart disease, arrhythmias, hypertension, congestive heart failure, stroke and diabetes. Untreated obstructive sleep apnea causes sleep disruption, nonrestorative sleep, and sleep deprivation. This can have an impact on your day to day functioning and cause daytime sleepiness and impairment of cognitive function, memory loss, mood disturbance, and problems focussing. Using ASV regularly can improve these symptoms.  We will update supply orders, today.    Follow up in 1 year

## 2024-01-22 NOTE — Progress Notes (Unsigned)
 PATIENT: Chad [name] DOB: Aug 27, 1929  REASON FOR VISIT: follow up HISTORY FROM: patient  No chief complaint on file.    HISTORY OF PRESENT ILLNESS:  01/22/24 ALL:  Annette Stable returns for follow up for complex sleep apnea on ASV. He was last seen by Dr Vickey Huger 01/2023 and doing well. AHI remained elevated at 10/h but 80% improvement from baseline. Annual follow up advised. Since, he reports doing     07/20/2022 ALL:  Chad [name] returns for follow up for complex sleep apnea on ASV. He continues to do well on therapy. He is using ASV nightly for about 6-7 hours. He does have an elevated air leak. He is usually able to move his nasal pillow around a little and this helps. He continues to use an chin strap. He has grown a beard/mustache due to skin irritation from chin strap. He reports feeling great from a sleep standpoint. He is having more trouble with back pain and shortness of breath. He continues supplemental oxygen through Dr Delton Coombes, pulmonology.     01/12/2022 ALL: Chad [name] is a 89 y.o. male here today for follow up for complex sleep apnea on ASV. He continues to have difficulty with OSA. His download below shows excellent compliance with 100% for days worn and 93% for >4 hours. His reports have been improving. His report demonstrates that his AHI is now 6.3, previously 8.8. His leak has also improved since having a mask refitting, his leak in the 95th percentile is 46.5 L/min, previously 56 L/min. He reports that he is doing better since he started using the chin strap, but he reports he hasn't changed his nasal pillows out each month. On supplemental O2 reportedly managed by cardiology.     07/14/21 ALL: Chad [name] is a 89 y.o. male here today for follow up for complex sleep apnea on ASV. He was last seen by Dr Vickey Huger 03/2021. She recommended he switch back to nasal pillow with chin strap and made adjustments to pressure settings. Since, he feels that he is doing great. He  likes the nasal pillow. He has not noticed any leaking from mask or mouth. He is tolerating chin strap. He reports that humidity level will not adjust below 71% and he does not like the heated tubing. He states that the water chamber is always empty when he wakes.   Chad [name] contnues to have a large air leak with ASV therapy. It has improved significant from >100l/min, now 56l/min. AHI was 22/hr, now 8.8/hr. Compliance report reveals excellent daily and acceptable four hour compliance. We have adjusted humidity level to 2 (from 4) on [place]. I have ordered mask refitting and reeducation regarding humidity settings at home. He was encouraged to continue using ASV nightly and for greater than 4 hours each night. I will recheck report to assess leak in 3 months. Risks of untreated sleep apnea review and education materials provided. Healthy lifestyle habits encouraged. He will follow up in 6 months, sooner if needed. He verbalizes understanding and agreement with this plan.   REVIEW OF SYSTEMS: Out of a complete 14 system review of symptoms, the patient complains only of the following symptoms, fatigue, back pain and all other reviewed systems are negative.  ESS: 6/24 FSS: 18/63   ALLERGIES: Allergies  Allergen Reactions   Doxycycline Hives   Amiodarone     Caused neuropathy   Carbapenems    Cephalexin Other (See Comments)   Cephalosporins Other (See Comments)  Chlorpheniramine Other (See Comments)    Swells prostate    Ciprofloxacin     Pt can not take with amiodarone    Decongestant [Oxymetazoline]     All decongestant - makes prostate swell   Diltiazem Swelling    Left leg swelling   Flagyl [Metronidazole] Nausea Only    Stomach ache   Hctz [Hydrochlorothiazide]    Levaquin [Levofloxacin In D5w] Other (See Comments)    unknown   Levofloxacin Nausea Only   Lidocaine     Pt unsure of   Penicillins Swelling   Teline [Tetracycline] Swelling    Hospitalization for 2 weeks,  prostate swelling   Tetanus Toxoid Swelling    Swells arms, Please pre-medicate with benadryl.   Z-Pak [Azithromycin] Other (See Comments)    Pt can not take with Amiodarone    Sulfonamide Derivatives Rash   Toprol Xl [Metoprolol Tartrate] Itching and Rash    Pt states he tolerates.    HOME MEDICATIONS: Outpatient Medications Prior to Visit  Medication Sig Dispense Refill   acetaminophen (TYLENOL) 650 MG CR tablet Take 650 mg by mouth daily.     Cholecalciferol (VITAMIN D3) 2000 units capsule Take 2,000 Units by mouth 2 (two) times daily.     diltiazem (CARDIZEM) 30 MG tablet TAKE 1 TABLET BY MOUTH EVERY 8 HOURS AS NEEDED 90 tablet 3   dofetilide (TIKOSYN) 250 MCG capsule Take 1 capsule by mouth twice daily 180 capsule 2   escitalopram (LEXAPRO) 10 MG tablet Take 10 mg by mouth at bedtime.     furosemide (LASIX) 20 MG tablet Take 1 tablet (20 mg total) by mouth 2 (two) times daily. 180 tablet 3   metoprolol succinate (TOPROL-XL) 100 MG 24 hr tablet Take 1 tablet by mouth twice daily 180 tablet 0   Multiple Vitamins-Minerals (CENTRUM SILVER PO) Take 1 tablet by mouth daily.     OXYGEN Inhale 2 L into the lungs at bedtime. Uses with CPAP machine     triamcinolone ointment (KENALOG) 0.1 % Apply 1 Application topically in the morning and at bedtime.     vitamin B-12 (CYANOCOBALAMIN) 500 MCG tablet Take 500 mcg by mouth daily.     warfarin (COUMADIN) 5 MG tablet TAKE 1/2 TO 1 (ONE-HALF TO ONE) TABLET BY MOUTH ONCE DAILY AS  DIRECTED  BY  COUMADIN  CLINIC. 100 tablet 0   No facility-administered medications prior to visit.    PAST MEDICAL HISTORY: Past Medical History:  Diagnosis Date   Bladder cancer (HCC) 2010   Chronic bronchitis (HCC)    Hypertension    Hypoglycemia    On home oxygen therapy    "2L w/CPAP at night" (06/21/2017)   OSA on CPAP    "w/2L O2 at night" (06/21/2017)   Persistent atrial fibrillation (HCC)        Pneumonia    "when he was young; again in 2016" (06/21/2017)    Presence of permanent cardiac pacemaker    Sensory ataxia     PAST SURGICAL HISTORY: Past Surgical History:  Procedure Laterality Date   BACK SURGERY     CARDIOVERSION N/A 04/23/2015   Procedure: CARDIOVERSION;  Surgeon: Pricilla Riffle, MD;  Location: [facility] ENDOSCOPY;  Service: Cardiovascular;  Laterality: N/A;   CARDIOVERSION N/A 07/30/2015   Procedure: CARDIOVERSION;  Surgeon: Jake Bathe, MD;  Location: [facility] ENDOSCOPY;  Service: Cardiovascular;  Laterality: N/A;   EYE SURGERY Bilateral 2009   "to lower pressure; no glaucoma" (06/21/2017)   INSERT / REPLACE /  REMOVE PACEMAKER  06/21/2017   LUMBAR DISC SURGERY  1987   PACEMAKER IMPLANT N/A 06/21/2017   Procedure: Pacemaker Implant;  Surgeon: Duke Salvia, MD;  Location: [facility] INVASIVE CV LAB;  Service: Cardiovascular;  Laterality: N/A;   TRANSURETHRAL RESECTION OF BLADDER TUMOR WITH GYRUS (TURBT-GYRUS)  07/10/2009    Cystourethroscopy, gyrus TURBT (transurethral  resection of bladder tumor).Hattie Perch 03/16/2011    FAMILY HISTORY: Family History  Problem Relation Age of Onset   Heart failure Father    Hypertension Father    Dementia Mother     SOCIAL HISTORY: Social History   Socioeconomic History   Marital status: Widowed    Spouse name: [name]   Number of children: 2   Years of education: 16   Highest education level: Not on file  Occupational History    Employer: RETIRED  Tobacco Use   Smoking status: Former    Current packs/day: 0.00    Average packs/day: 0.5 packs/day for 20.0 years (10.0 ttl pk-yrs)    Types: Cigarettes    Start date: 06/03/1951    Quit date: 06/03/1971    Years since quitting: 52.6    Passive exposure: Past   Smokeless tobacco: Never   Tobacco comments:    Former smoker 08/16/22  Vaping Use   Vaping status: Never Used  Substance and Sexual Activity   Alcohol use: No   Drug use: No   Sexual activity: Not on file  Other Topics Concern   Not on file  Social History Narrative   Patient is married Forensic psychologist)  and lives at home with his wife.   Patient has two children.   Patient is retired.   Patient has a college education.   Patient is right-handed.   Patient does not drink any caffeine.   Social Drivers of Corporate investment banker Strain: Not on file  Food Insecurity: Not on file  Transportation Needs: Not on file  Physical Activity: Not on file  Stress: Not on file  Social Connections: Not on file  Intimate Partner Violence: Not on file     PHYSICAL EXAM  There were no vitals filed for this visit.   There is no height or weight on file to calculate BMI.  Generalized: Well developed, in no acute distress  Cardiology: normal rate and rhythm, no murmur noted Respiratory: clear to auscultation bilaterally  Neurological examination  Mentation: Alert oriented to time, place, history taking. Follows all commands speech and language fluent Cranial nerve II-XII: Pupils were equal round reactive to light. Extraocular movements were full, visual field were full  Motor: The motor testing reveals 5 over 5 strength of all 4 extremities. Good symmetric motor tone is noted throughout.  Gait and station: Gait is arthritic, stable     DIAGNOSTIC DATA (LABS, IMAGING, TESTING) - I reviewed patient records, labs, notes, testing and imaging myself where available.      No data to display           Lab Results  Component Value Date   WBC 13.7 (H) 07/25/2023   HGB 15.4 07/25/2023   HCT 48.8 07/25/2023   MCV 91.7 07/25/2023   PLT 188 07/25/2023      Component Value Date/Time   NA 143 12/19/2023 1536   K 4.5 12/19/2023 1536   CL 103 12/19/2023 1536   CO2 30 (H) 12/19/2023 1536   GLUCOSE 88 12/19/2023 1536   GLUCOSE 161 (H) 07/25/2023 0338   BUN 19 12/19/2023 1536   CREATININE 1.16  12/19/2023 1536   CREATININE 1.16 (H) 06/12/2017 1635   CALCIUM 9.5 12/19/2023 1536   PROT 6.0 (L) 07/25/2023 0338   ALBUMIN 3.2 (L) 07/25/2023 0338   AST 23 07/25/2023 0338   ALT 15 07/25/2023  0338   ALKPHOS 68 07/25/2023 0338   BILITOT 0.5 07/25/2023 0338   GFRNONAA 50 (L) 07/25/2023 0338   GFRAA 54 (L) 08/04/2020 1626   Lab Results  Component Value Date   CHOL 179 06/10/2010   HDL 46 06/10/2010   LDLCALC 118 06/10/2010   TRIG 75 06/10/2010   No results found for: "HGBA1C" No results found for: "VITAMINB12" Lab Results  Component Value Date   TSH 2.062 04/17/2015     ASSESSMENT AND PLAN 89 y.o. year old male  has a past medical history of Bladder cancer (HCC) (2010), Chronic bronchitis (HCC), Hypertension, Hypoglycemia, On home oxygen therapy, OSA on CPAP, Persistent atrial fibrillation (HCC), Pneumonia, Presence of permanent cardiac pacemaker, and Sensory ataxia. here with   No diagnosis found.   Chad [name] is doing well on ASV therapy. Compliance report reveals excellent compliance. AHI remains elevated and correlates with higher leak. I encouraged him to change his mask out monthly to help with the leak and to monitor for leak at home. Facial hair also contributes. He does not wish to try a different mask. He was encouraged to continue using ASV nightly and for greater than 4 hours each night. We will update supply orders as indicated.  Risks of untreated sleep apnea review and education materials provided. Healthy lifestyle habits encouraged. He will follow up in 6 months with Dr. Vickey Huger to assess any potential changes in settings. He verbalizes understanding and agreement with this plan.    No orders of the defined types were placed in this encounter.    No orders of the defined types were placed in this encounter.     Shawnie Dapper, FNP-C 01/22/2024, 10:14 AM [facility] [address] [place], [place] [address] [phone]

## 2024-01-22 NOTE — Patient Instructions (Addendum)
-  PULMONARY FIBROSIS: Pulmonary fibrosis is a condition where the lung tissue becomes scarred and stiff, making it difficult to breathe. Your condition has been stable since 2021, and we decided not to start antifibrotic therapy due to potential side effects and your age. No changes to your current management plan are needed at this time.  -SLEEP APNEA: Sleep apnea is a condition where breathing repeatedly stops and starts during sleep. You are using your CPAP machine consistently at night, which is good. Please continue using it nightly.  -OXYGEN THERAPY: You are using 2 liters of oxygen during the day to help with your breathing. Your oxygen levels at home have been normal, but you might consider using oxygen during showers if feasible. Continue with your current oxygen therapy plan.  -CONGESTIVE HEART FAILURE: Congestive heart failure is a condition where the heart doesn't pump blood as well as it should. You are managing this with daily weights and Lasix. Please continue this regimen and monitor for any signs of fluid overload.  -GENERAL HEALTH MAINTENANCE: Continue monitoring your oxygen levels at home and consider using antacids if you experience bloating after meals.  INSTRUCTIONS:  Please continue with your current treatments and monitoring routines. If you experience any new or worsening symptoms, contact our office. Consider using oxygen during showers if it is feasible for you.  Follow-up: 6 months with Dr. Delton Coombes with CT chest prior

## 2024-01-22 NOTE — Progress Notes (Signed)
 @Patient  ID: Chad [name], male    DOB: 06/06/1929, 89 y.o.   MRN: [id number]  Chief Complaint  Patient presents with   Follow-up    Referring provider: [facility]  HPI: 89 year old male, former smoker quit 1972.  Past medical history significant for a A-fib/flutter, SVT, hypertension, idiopathic pulmonary fibrosis, chronic respiratory failure, obstructive sleep apnea on CPAP, amiodarone use, neuropathy, bladder cancer.  Patient of Dr. Lenor Derrick, last seen in office July 25, 2023.   Previous [facility] pulmonary encounter: 07/25/23- Dr. Delton Coombes  Assessment & Plan:  Acute on chronic diastolic heart failure (HCC) Decompensated acute on chronic diastolic CHF.  He is much better after aggressive diuresis on the recent admission.  I think the most important thing we can do to try and help with this is to make sure he uses his CPAP and his oxygen reliably.  I did explain to him in detail that desaturations or uncontrolled CPAP will make his heart failure harder to manage.   OSA on CPAP His CPAP is in good repair, 89 years old.  He has oxygen 2 L/min bled in.  Uses it reliably.    Interstitial lung disease (HCC) Mild basilar interstitial lung disease, stable on serial imaging including most recent CT chest done on this hospitalization   Chronic intermittent hypoxia with obstructive sleep apnea Discussed with him the importance of wearing his O2 reliably with exertion.   01/22/2024- interim hx  Discussed the use of AI scribe software for clinical note transcription with the patient, who gave verbal consent to proceed.  History of Present Illness   Patient presents today for 6 month follow-up/ CHF, OSA, ILD and chronic respiratory failure. Wears CPAP at night and oxygen with exertion.   He has a history of respiratory failure and lung fibrosis due to long-term exposure to radioisotopes and radar over a 40-year career. The fibrosis, noted as mild on recent CT imaging, has been stable  since 2021 with no significant progression. He is on oxygen therapy and is not currently on antifibrotic medications due to potential side effects and his age. No significant symptoms related to the fibrosis are reported.  He uses a CPAP machine at night for sleep apnea, with oxygen supplementation. Sleep quality is variable, with some nights being good and others less restful. No episodes of waking up gasping or short of breath are noted, though he has difficulty returning to sleep after waking. He does not use the CPAP during daytime naps.  He consistently uses two liters of oxygen during the day and monitors his oxygen levels at home, reporting no abnormal readings. Oxygen levels can drop into the 80s during showers, as he does not wear oxygen in the shower due to logistical challenges.  He monitors his weight daily and reports no significant changes or leg swelling, as he keeps his legs elevated most of the time. He is on Lasix twice a day but sometimes skips the second dose if he is going out. Breathing is generally good, with occasional shortness of breath during exertion or certain movements. Eating large meals can cause bloating and affect his breathing, so he tries to eat smaller portions.      Allergies  Allergen Reactions   Doxycycline Hives   Amiodarone     Caused neuropathy   Carbapenems    Cephalexin Other (See Comments)   Cephalosporins Other (See Comments)   Chlorpheniramine Other (See Comments)    Swells prostate    Ciprofloxacin  Pt can not take with amiodarone    Decongestant [Oxymetazoline]     All decongestant - makes prostate swell   Diltiazem Swelling    Left leg swelling   Flagyl [Metronidazole] Nausea Only    Stomach ache   Hctz [Hydrochlorothiazide]    Levaquin [Levofloxacin In D5w] Other (See Comments)    unknown   Levofloxacin Nausea Only   Lidocaine     Pt unsure of   Penicillins Swelling   Teline [Tetracycline] Swelling    Hospitalization for 2  weeks, prostate swelling   Tetanus Toxoid Swelling    Swells arms, Please pre-medicate with benadryl.   Z-Pak [Azithromycin] Other (See Comments)    Pt can not take with Amiodarone    Sulfonamide Derivatives Rash   Toprol Xl [Metoprolol Tartrate] Itching and Rash    Pt states he tolerates.    Immunization History  Administered Date(s) Administered   Fluad Quad(high Dose 65+) 10/22/2020, 09/16/2021    Past Medical History:  Diagnosis Date   Bladder cancer (HCC) 2010   Chronic bronchitis (HCC)    Hypertension    Hypoglycemia    On home oxygen therapy    "2L w/CPAP at night" (06/21/2017)   OSA on CPAP    "w/2L O2 at night" (06/21/2017)   Persistent atrial fibrillation (HCC)        Pneumonia    "when he was young; again in 2016" (06/21/2017)   Presence of permanent cardiac pacemaker    Sensory ataxia     Tobacco History: Social History   Tobacco Use  Smoking Status Former   Current packs/day: 0.00   Average packs/day: 0.5 packs/day for 20.0 years (10.0 ttl pk-yrs)   Types: Cigarettes   Start date: 06/03/1951   Quit date: 06/03/1971   Years since quitting: 52.6   Passive exposure: Past  Smokeless Tobacco Never  Tobacco Comments   Former smoker 08/16/22   Counseling given: Not Answered Tobacco comments: Former smoker 08/16/22   Outpatient Medications Prior to Visit  Medication Sig Dispense Refill   acetaminophen (TYLENOL) 650 MG CR tablet Take 650 mg by mouth daily.     Cholecalciferol (VITAMIN D3) 2000 units capsule Take 2,000 Units by mouth 2 (two) times daily.     diltiazem (CARDIZEM) 30 MG tablet TAKE 1 TABLET BY MOUTH EVERY 8 HOURS AS NEEDED 90 tablet 3   dofetilide (TIKOSYN) 250 MCG capsule Take 1 capsule by mouth twice daily 180 capsule 2   escitalopram (LEXAPRO) 10 MG tablet Take 10 mg by mouth at bedtime.     furosemide (LASIX) 20 MG tablet Take 1 tablet (20 mg total) by mouth 2 (two) times daily. 180 tablet 3   metoprolol succinate (TOPROL-XL) 100 MG 24 hr  tablet Take 1 tablet by mouth twice daily 180 tablet 0   Multiple Vitamins-Minerals (CENTRUM SILVER PO) Take 1 tablet by mouth daily.     OXYGEN Inhale 2 L into the lungs at bedtime. Uses with CPAP machine     triamcinolone ointment (KENALOG) 0.1 % Apply 1 Application topically in the morning and at bedtime.     vitamin B-12 (CYANOCOBALAMIN) 500 MCG tablet Take 500 mcg by mouth daily.     warfarin (COUMADIN) 5 MG tablet TAKE 1/2 TO 1 (ONE-HALF TO ONE) TABLET BY MOUTH ONCE DAILY AS  DIRECTED  BY  COUMADIN  CLINIC. 100 tablet 0   No facility-administered medications prior to visit.      Review of Systems  Review of Systems  Constitutional:  Negative.  Negative for unexpected weight change.  Respiratory: Negative.       Physical Exam  BP 128/60 (BP Location: Right Arm, Patient Position: Sitting, Cuff Size: Normal)   Pulse 63   Temp (!) 97.5 F (36.4 C) (Temporal)   Ht 5\' 9"  (1.753 m)   Wt 228 lb (103.4 kg)   SpO2 93%   BMI 33.67 kg/m  Physical Exam Constitutional:      Appearance: Normal appearance.  Cardiovascular:     Rate and Rhythm: Normal rate and regular rhythm.  Pulmonary:     Effort: Pulmonary effort is normal.     Breath sounds: Normal breath sounds.  Musculoskeletal:        General: Normal range of motion.  Skin:    General: Skin is warm and dry.  Neurological:     General: No focal deficit present.     Mental Status: He is alert and oriented to person, place, and time.  Psychiatric:        Mood and Affect: Mood normal.        Behavior: Behavior normal.        Thought Content: Thought content normal.        Judgment: Judgment normal.      Lab Results:  CBC    Component Value Date/Time   WBC 13.7 (H) 07/25/2023 0338   RBC 5.32 07/25/2023 0338   HGB 15.4 07/25/2023 0338   HGB 15.8 04/22/2021 1421   HCT 48.8 07/25/2023 0338   HCT 46.6 04/22/2021 1421   PLT 188 07/25/2023 0338   PLT 187 04/22/2021 1421   MCV 91.7 07/25/2023 0338   MCV 89  04/22/2021 1421   MCH 28.9 07/25/2023 0338   MCHC 31.6 07/25/2023 0338   RDW 14.4 07/25/2023 0338   RDW 13.8 04/22/2021 1421   LYMPHSABS 1.1 12/03/2022 2355   MONOABS 0.6 12/03/2022 2355   EOSABS 0.2 12/03/2022 2355   BASOSABS 0.0 12/03/2022 2355    BMET    Component Value Date/Time   NA 143 12/19/2023 1536   K 4.5 12/19/2023 1536   CL 103 12/19/2023 1536   CO2 30 (H) 12/19/2023 1536   GLUCOSE 88 12/19/2023 1536   GLUCOSE 161 (H) 07/25/2023 0338   BUN 19 12/19/2023 1536   CREATININE 1.16 12/19/2023 1536   CREATININE 1.16 (H) 06/12/2017 1635   CALCIUM 9.5 12/19/2023 1536   GFRNONAA 50 (L) 07/25/2023 0338   GFRAA 54 (L) 08/04/2020 1626    BNP    Component Value Date/Time   BNP 214.6 (H) 07/22/2023 2123    ProBNP No results found for: "PROBNP"  Imaging: No results found.   Assessment & Plan:      Pulmonary Fibrosis Mild progression since 2021, stable on most recent imaging. Discussed potential antifibrotic therapy, but due to age, comorbidities, and potential side effects, it was deemed not beneficial at this time. -No changes to current management plan.  Sleep Apnea Using CPAP machine consistently at night. No reported issues with the machine. -Continue using CPAP machine nightly.  Oxygen Therapy Using 2 liters of oxygen consistently during the day. No reported abnormal readings at home. Not using oxygen during showering. -Consider using oxygen during showering if feasible. -Continue using 2 liters of oxygen consistently during the day.  Congestive Heart Failure Managing with daily weights and diuretic (Lasix). Sometimes skipping second dose of Lasix due to outings. -Continue daily weights and Lasix as tolerated. -Continue monitoring for signs of fluid overload.  General Health Maintenance -  Continue monitoring oxygen levels at home. -Consider use of antacids for bloating after meals.        Glenford Bayley, NP 01/22/2024

## 2024-01-22 NOTE — Telephone Encounter (Signed)
 I called radiology and told them to have the pts CT read ASAP, since the pt was scheduled to see Beth at 1:30 pm. I was unable to reach pt to inform him we would need to reschedule. Pt did not answer. Pt came in office and I verbally explained to pt that he could reschedule for next week and we would have results by then. NFN

## 2024-01-22 NOTE — Progress Notes (Unsigned)
 Marland Kitchen

## 2024-01-22 NOTE — Telephone Encounter (Signed)
 Wrong pt!!!!!  Please disregard this note!!

## 2024-01-23 ENCOUNTER — Encounter: Payer: Self-pay | Admitting: Family Medicine

## 2024-01-23 ENCOUNTER — Encounter: Payer: Self-pay | Admitting: Podiatry

## 2024-01-23 ENCOUNTER — Ambulatory Visit: Payer: Medicare Other | Admitting: Podiatry

## 2024-01-23 ENCOUNTER — Ambulatory Visit: Payer: Medicare Other | Admitting: Family Medicine

## 2024-01-23 VITALS — BP 127/79 | HR 76 | Ht 69.0 in | Wt 230.0 lb

## 2024-01-23 DIAGNOSIS — Z7189 Other specified counseling: Secondary | ICD-10-CM | POA: Diagnosis not present

## 2024-01-23 DIAGNOSIS — M79675 Pain in left toe(s): Secondary | ICD-10-CM | POA: Diagnosis not present

## 2024-01-23 DIAGNOSIS — M79674 Pain in right toe(s): Secondary | ICD-10-CM

## 2024-01-23 DIAGNOSIS — B351 Tinea unguium: Secondary | ICD-10-CM | POA: Diagnosis not present

## 2024-01-23 DIAGNOSIS — G4739 Other sleep apnea: Secondary | ICD-10-CM

## 2024-01-23 DIAGNOSIS — Z7901 Long term (current) use of anticoagulants: Secondary | ICD-10-CM

## 2024-01-24 ENCOUNTER — Ambulatory Visit: Attending: Cardiology | Admitting: *Deleted

## 2024-01-24 ENCOUNTER — Other Ambulatory Visit: Payer: Self-pay

## 2024-01-24 DIAGNOSIS — Z5181 Encounter for therapeutic drug level monitoring: Secondary | ICD-10-CM | POA: Diagnosis not present

## 2024-01-24 DIAGNOSIS — I4819 Other persistent atrial fibrillation: Secondary | ICD-10-CM | POA: Diagnosis not present

## 2024-01-24 LAB — POCT INR: INR: 2.2 (ref 2.0–3.0)

## 2024-01-24 MED ORDER — METOPROLOL SUCCINATE ER 100 MG PO TB24: 100.0000 mg | ORAL_TABLET | Freq: Two times a day (BID) | ORAL | 3 refills | Status: AC

## 2024-01-24 NOTE — Patient Instructions (Signed)
 Continue 1/2 tablet daily except 1 tablet on Tuesdays Recheck in 4 weeks

## 2024-01-24 NOTE — Progress Notes (Signed)
 Subjective: Chief Complaint  Patient presents with   RFC    RM#11 RFC patient want his fungus reevaluated as well today. Patient is taking a blood thinner.    89 y.o. returns the office today for painful, elongated, thickened toenails which he cannot trim himself.  No swelling redness or injury to the toenail sites.  Denies any ulcerations or new concerns today.  He is on Coumadin  PCP: Assunta Found, MD  Objective: AAO 3, NAD DP/PT pulses palpable, CRT less than 3 seconds Nails hypertrophic, dystrophic, elongated, brittle, discolored 10.  Subungual debris present.  Incurvation present the hallux toenail left side worse than right.  No edema, erythema or signs of infection of the toenail sites. No open lesions or pre-ulcerative lesions are identified. No pain with calf compression, swelling, warmth, erythema.  Assessment: Patient presents with symptomatic onychomycosis; chronic anticoagulation  Plan: -Treatment options including alternatives, risks, complications were discussed -Nails sharply debrided 10 without complication/bleeding.   -Discussed daily foot inspection. If there are any changes, to call the office immediately.   Return in about 3 months (around 04/24/2024).   Ovid Curd, DPM

## 2024-02-01 ENCOUNTER — Telehealth: Payer: Self-pay

## 2024-02-01 NOTE — Telephone Encounter (Signed)
 Bipap order faxed to [facility]

## 2024-02-02 DIAGNOSIS — J8410 Pulmonary fibrosis, unspecified: Secondary | ICD-10-CM | POA: Diagnosis not present

## 2024-02-02 DIAGNOSIS — I4891 Unspecified atrial fibrillation: Secondary | ICD-10-CM | POA: Diagnosis not present

## 2024-02-02 DIAGNOSIS — Z515 Encounter for palliative care: Secondary | ICD-10-CM | POA: Diagnosis not present

## 2024-02-02 DIAGNOSIS — G4733 Obstructive sleep apnea (adult) (pediatric): Secondary | ICD-10-CM | POA: Diagnosis not present

## 2024-02-02 DIAGNOSIS — I5032 Chronic diastolic (congestive) heart failure: Secondary | ICD-10-CM | POA: Diagnosis not present

## 2024-02-19 ENCOUNTER — Ambulatory Visit (INDEPENDENT_AMBULATORY_CARE_PROVIDER_SITE_OTHER): Payer: Medicare Other

## 2024-02-19 DIAGNOSIS — I495 Sick sinus syndrome: Secondary | ICD-10-CM | POA: Diagnosis not present

## 2024-02-20 LAB — CUP PACEART REMOTE DEVICE CHECK
Battery Remaining Longevity: 40 mo
Battery Remaining Percentage: 37 %
Battery Voltage: 2.96 V
Brady Statistic AP VP Percent: 19 %
Brady Statistic AP VS Percent: 67 %
Brady Statistic AS VP Percent: 1.4 %
Brady Statistic AS VS Percent: 12 %
Brady Statistic RA Percent Paced: 82 %
Brady Statistic RV Percent Paced: 20 %
Date Time Interrogation Session: 20250407042821
Implantable Lead Connection Status: 753985
Implantable Lead Connection Status: 753985
Implantable Lead Implant Date: 20180808
Implantable Lead Implant Date: 20180808
Implantable Lead Location: 753859
Implantable Lead Location: 753860
Implantable Lead Model: 5076
Implantable Lead Model: 5076
Implantable Pulse Generator Implant Date: 20180808
Lead Channel Impedance Value: 450 Ohm
Lead Channel Impedance Value: 450 Ohm
Lead Channel Pacing Threshold Amplitude: 0.75 V
Lead Channel Pacing Threshold Amplitude: 0.75 V
Lead Channel Pacing Threshold Pulse Width: 0.5 ms
Lead Channel Pacing Threshold Pulse Width: 0.5 ms
Lead Channel Sensing Intrinsic Amplitude: 12 mV
Lead Channel Sensing Intrinsic Amplitude: 2.8 mV
Lead Channel Setting Pacing Amplitude: 2 V
Lead Channel Setting Pacing Amplitude: 2.5 V
Lead Channel Setting Pacing Pulse Width: 0.5 ms
Lead Channel Setting Sensing Sensitivity: 2 mV
Pulse Gen Model: 2272
Pulse Gen Serial Number: 8932564

## 2024-02-21 ENCOUNTER — Ambulatory Visit: Attending: Cardiology | Admitting: *Deleted

## 2024-02-21 DIAGNOSIS — Z5181 Encounter for therapeutic drug level monitoring: Secondary | ICD-10-CM | POA: Diagnosis not present

## 2024-02-21 DIAGNOSIS — I4819 Other persistent atrial fibrillation: Secondary | ICD-10-CM | POA: Diagnosis not present

## 2024-02-21 LAB — POCT INR: INR: 2.1 (ref 2.0–3.0)

## 2024-02-21 NOTE — Patient Instructions (Signed)
 Continue 1/2 tablet daily except 1 tablet on Tuesdays Recheck in 5 weeks

## 2024-02-23 ENCOUNTER — Other Ambulatory Visit: Payer: Self-pay | Admitting: Cardiology

## 2024-02-23 DIAGNOSIS — I4819 Other persistent atrial fibrillation: Secondary | ICD-10-CM

## 2024-03-04 DIAGNOSIS — I4891 Unspecified atrial fibrillation: Secondary | ICD-10-CM | POA: Diagnosis not present

## 2024-03-12 ENCOUNTER — Telehealth: Payer: Self-pay | Admitting: Cardiology

## 2024-03-12 NOTE — Telephone Encounter (Signed)
 Daughter would like to know if INR can be moved up to soonest available because they will be out of town.

## 2024-03-12 NOTE — Telephone Encounter (Signed)
 Spoke with daughter per DPR and she states patient has been having muscle spasms and she would like to know if its ok for patient to try OTC pain patch or pain cream. She would like to know with his cardiac concerns what you recommend.

## 2024-03-12 NOTE — Telephone Encounter (Signed)
 Daughter says since 4/24 patient has been having muscle spasms in back. Lasts a few minutes at a time. Denies chest pain. She just wants to know if he can use over the counter patches to relieve pain in lower back.

## 2024-03-12 NOTE — Telephone Encounter (Signed)
 Returned call to daughter.  She would like to move INR appt up to tomorrow if possible due to illness in the family that might interfere with appt on 5/14.  Gladly moved appt to 4/30 at 11:45.  She appreciated return call.  Pt is having bad muscle spasms in back.  She is going to get him a pain patch without lidocaine  to try.

## 2024-03-13 ENCOUNTER — Ambulatory Visit: Attending: Cardiology

## 2024-03-13 DIAGNOSIS — N1831 Chronic kidney disease, stage 3a: Secondary | ICD-10-CM | POA: Diagnosis not present

## 2024-03-13 DIAGNOSIS — E1159 Type 2 diabetes mellitus with other circulatory complications: Secondary | ICD-10-CM | POA: Diagnosis not present

## 2024-03-13 DIAGNOSIS — G894 Chronic pain syndrome: Secondary | ICD-10-CM | POA: Diagnosis not present

## 2024-03-13 DIAGNOSIS — J84112 Idiopathic pulmonary fibrosis: Secondary | ICD-10-CM | POA: Diagnosis not present

## 2024-03-13 DIAGNOSIS — M545 Low back pain, unspecified: Secondary | ICD-10-CM | POA: Diagnosis not present

## 2024-03-13 DIAGNOSIS — I130 Hypertensive heart and chronic kidney disease with heart failure and stage 1 through stage 4 chronic kidney disease, or unspecified chronic kidney disease: Secondary | ICD-10-CM | POA: Diagnosis not present

## 2024-03-13 DIAGNOSIS — Z5181 Encounter for therapeutic drug level monitoring: Secondary | ICD-10-CM

## 2024-03-13 DIAGNOSIS — Z9981 Dependence on supplemental oxygen: Secondary | ICD-10-CM | POA: Diagnosis not present

## 2024-03-13 DIAGNOSIS — N1832 Chronic kidney disease, stage 3b: Secondary | ICD-10-CM | POA: Diagnosis not present

## 2024-03-13 DIAGNOSIS — Z6833 Body mass index (BMI) 33.0-33.9, adult: Secondary | ICD-10-CM | POA: Diagnosis not present

## 2024-03-13 DIAGNOSIS — I4892 Unspecified atrial flutter: Secondary | ICD-10-CM | POA: Diagnosis not present

## 2024-03-13 DIAGNOSIS — I4819 Other persistent atrial fibrillation: Secondary | ICD-10-CM

## 2024-03-13 DIAGNOSIS — E6609 Other obesity due to excess calories: Secondary | ICD-10-CM | POA: Diagnosis not present

## 2024-03-13 DIAGNOSIS — E1129 Type 2 diabetes mellitus with other diabetic kidney complication: Secondary | ICD-10-CM | POA: Diagnosis not present

## 2024-03-13 LAB — POCT INR: INR: 2.6 (ref 2.0–3.0)

## 2024-03-13 NOTE — Patient Instructions (Signed)
 Description   Continue 1/2 tablet daily except 1 tablet on Tuesdays Recheck in 6 weeks

## 2024-03-13 NOTE — Telephone Encounter (Signed)
 Spoke with daughter per DPR and she is aware of provider message. See message below  I don't see any issues with his primary provider using any meds that they would suggest.  No cardiac contraindication to therapy.

## 2024-03-13 NOTE — Telephone Encounter (Signed)
 Left voicemail to return call to office

## 2024-03-13 NOTE — Telephone Encounter (Signed)
 Daughter Judeen Nose) returned RN's call.

## 2024-03-21 DIAGNOSIS — Z515 Encounter for palliative care: Secondary | ICD-10-CM | POA: Diagnosis not present

## 2024-03-21 DIAGNOSIS — I5032 Chronic diastolic (congestive) heart failure: Secondary | ICD-10-CM | POA: Diagnosis not present

## 2024-03-21 DIAGNOSIS — J8410 Pulmonary fibrosis, unspecified: Secondary | ICD-10-CM | POA: Diagnosis not present

## 2024-03-27 ENCOUNTER — Encounter

## 2024-03-28 ENCOUNTER — Encounter: Payer: Self-pay | Admitting: Neurology

## 2024-03-28 ENCOUNTER — Telehealth: Payer: Self-pay | Admitting: Neurology

## 2024-03-28 NOTE — Telephone Encounter (Signed)
 LVM and sent letter in mail informing pt of appt change on 01/22/25 appt - MD schedule change

## 2024-04-01 NOTE — Progress Notes (Signed)
 Remote pacemaker transmission.

## 2024-04-01 NOTE — Addendum Note (Signed)
 Addended by: Edra Govern D on: 04/01/2024 01:24 PM   Modules accepted: Orders

## 2024-04-02 ENCOUNTER — Encounter (HOSPITAL_COMMUNITY): Payer: Self-pay | Admitting: Physician Assistant

## 2024-04-02 ENCOUNTER — Ambulatory Visit (HOSPITAL_COMMUNITY)
Admission: RE | Admit: 2024-04-02 | Discharge: 2024-04-02 | Disposition: A | Discharge status: Home / Self Care | Source: Ambulatory Visit | Attending: Physician Assistant | Admitting: Physician Assistant

## 2024-04-02 VITALS — BP 140/90 | HR 64 | Ht 69.0 in | Wt 226.2 lb

## 2024-04-02 DIAGNOSIS — I480 Paroxysmal atrial fibrillation: Secondary | ICD-10-CM

## 2024-04-02 DIAGNOSIS — I4819 Other persistent atrial fibrillation: Secondary | ICD-10-CM | POA: Diagnosis not present

## 2024-04-02 DIAGNOSIS — Z79899 Other long term (current) drug therapy: Secondary | ICD-10-CM | POA: Diagnosis not present

## 2024-04-02 DIAGNOSIS — D6869 Other thrombophilia: Secondary | ICD-10-CM

## 2024-04-02 DIAGNOSIS — Z5181 Encounter for therapeutic drug level monitoring: Secondary | ICD-10-CM | POA: Diagnosis not present

## 2024-04-02 NOTE — Progress Notes (Signed)
 Primary Care Physician: [name] Referring Physician: [facility] f/u   Chad [name] is a 89 y.o. male with a h/o persistent afib in past failing flecainide  and amiodarone  , latter was stopped due to neuropathy. He was  started on tikosyn  9/16 and was seen in the afib clinc for f/u in January. He was in SR, no complaints. He had prolonged qtc that day, reviewed with Dr. Rodolfo [name] and he felt QTC was ok. EKG repeated one week and qtc acceptable.Pt was not having any  afib that pt was aware of. He had been feeling well. He denies any new drugs or change in previous drugs. No change in general health.  He was seen in the afib clinic today, 3/16 due to complaints of having vision dim for a few seconds at a time. He feels liks he could pass out. No syncope. Started a couple of weeks ago. He can be sitting or standing. He is not aware of any sensation of heart racing or skipping with episodes. He was aware of heart racing, for short period of time last night but did not have any episodes of vision dimming with it. He has been instructed not to drive until the origin of these episodes can be further determined.30 day monitor was placed. Within a few days of wearing the monitor, an episode of elevated heart beat was noted, afib with RVR. Metoprolol  was added at 25 mg a day and for the remainder of the monitor, there was no further elevated heart rate or episodes of feeling like he would pass out. No significant brady or pauses.   Returns 8/8 and feels well. No episodes of afib that he is aware of and no further episodes of RVR causing presyncopal rhythms, since starting back on metoprolol  ER 25 mg a day. HR is office is 52 bpm, but he is not symptomatic with this. Has some issues with peripheral neuropathy but no falls. Continues on tikosyn  bid and is compliant with warfarin.  F/u 12/21. He reports that he has had some lightheaded spells but has not had any syncope. He just finished  wearing an event monitor for these symptoms, placed by Chad [name], and pt will mail back today. Last time pt had these symptoms, monitor showed brief episodes of rapid afib and rate control was increased. He continues on Tikosyn  with warfarin.  F/u 3/22. He has felt well. He had one episode of tachycardia but was associated with climbing 8 flights of stairs. No further dizziness spells. Last time he saw Dr. Rodolfo [name], he talked to Dr. Lavonne [name] re possibly stopping dofetilide  and possibility of a PPM. Pt does f/u with Dr. Rodolfo [name] 4/9 at which time this will be further discussed. Pt was leaving for [facility] right after that appointment.He is slow today at 48 bpm.  F/u in afib clinic 08/04/20. I have not seen pt since 2018. He is here as he had some elevated HR last week. He saw his PCP who increased his metoprolol  succinate from  50 mg bid to 100 mg bid last Wednesday. He has not seen any elevated HR since then. He appears to be tolerating this increased dose. He continues on dofetilide  250 mcg bid. He saw Dr. Lavonne [name] in August with increased shortness of breath and pedal edema. His lasix  was increased. He was found toan URI at that time.  He was not having any  arrhythmia then. Recent echo showed normal EF, but technically challenging study. Today, he feels well.  He is in an a paced rhythm. He remains on warfarin  for a CHA2DS2VASc of at least 4.   F/u in afib clinic, 03/04/21. He was referred by Dr. Atlas [name] office for sensation of palpitations and feeling unwell for a few days this week, seeing HR's up to 155 bpm at night.. He states that he notices this when he lies down to sleep and will make his left arm hurt if it is sustained. He states for several hours last night,  he felt his heart racing but today, his ekg  shows atrial paced with PAC's. Device interrogated and interrupted by [facility] and Chad [name]  can only see a HR of 155 bpm x 44 sec's last night. The last sustained elevate HR was atrial tach at 150-160  April 11th. Possibly afib at 150 bpm for 8-9 hours March 22 nd. They will lower the detection rate to 120 as they feel the current  detection limit at 145 bpm is possibly not capturing a lot of what pt is feeling. His BB was reduced back in December as it was felt that he was doing well, so plan to increase BB  today. He continues on dofetilide  250 mcg bid with stable qt.   F/u in afib clinic, as pt noted symptoms of afib since Monday and device clinic confirmed he was in afib and referred to afib clinic. Fortunately, today, he is back in SR. No recent trigger identified. He is complaint with tikosyn  250 mcg bid. He is now a paced by EKG. Otherwise, no complaints. He is on warfarin.   F/u in afib clinic, 01/30/23. He had a fall with a subdural hematoma, 01/24, warfarin was held for a period of time, but now is back on it since early March. EKG shows he is in a paced rhythm. He continues on dofetilide  250 mcg bid. Labs drawn for Tikosyn  surveillance and qt is stable. He saw Dr. Rodolfo [name] in January with no changes other than increase of lasix  for several days.he is here with his daughter today.   Patient returns for follow up for atrial fibrillation and dofetilide  monitoring. He reports that he has had 2-3 brief episodes of afib. Last device interrogation showed <1% afib burden. He denies any bleeding issues on anticoagulation.   Today, he  denies symptoms of palpitations, chest pain, shortness of breath, orthopnea, PND, lower extremity edema, dizziness, presyncope, syncope, snoring, daytime somnolence, bleeding, or neurologic sequela. The patient is tolerating medications without difficulties and is otherwise without complaint today.    Past Medical History:  Diagnosis Date   Bladder cancer (HCC) 2010   CHF (congestive heart failure) (HCC)    sept 2024   Chronic bronchitis (HCC)    Hypertension    Hypoglycemia    On home oxygen  therapy    "2L w/CPAP at night" (06/21/2017)   OSA on CPAP    "w/2L O2 at  night" (06/21/2017)   Persistent atrial fibrillation (HCC)        Pneumonia    "when he was young; again in 2016" (06/21/2017)   Presence of permanent cardiac pacemaker    Sensory ataxia     Current Outpatient Medications  Medication Sig Dispense Refill   acetaminophen  (TYLENOL ) 650 MG CR tablet Take 650 mg by mouth daily.     Cholecalciferol  (VITAMIN D3) 2000 units capsule Take 2,000 Units by mouth 2 (two) times daily.     diltiazem  (CARDIZEM ) 30 MG tablet TAKE 1 TABLET BY MOUTH EVERY 8 HOURS AS NEEDED  90 tablet 3   dofetilide  (TIKOSYN ) 250 MCG capsule Take 1 capsule by mouth twice daily 180 capsule 2   escitalopram  (LEXAPRO ) 10 MG tablet Take 10 mg by mouth at bedtime.     furosemide  (LASIX ) 20 MG tablet Take 1 tablet (20 mg total) by mouth 2 (two) times daily. 180 tablet 3   metoprolol  succinate (TOPROL -XL) 100 MG 24 hr tablet Take 1 tablet (100 mg total) by mouth 2 (two) times daily. 180 tablet 3   Multiple Vitamins-Minerals (CENTRUM SILVER  PO) Take 1 tablet by mouth daily.     OXYGEN  Inhale 2 L into the lungs at bedtime. Uses with CPAP machine     triamcinolone ointment (KENALOG) 0.1 % Apply 1 Application topically in the morning and at bedtime.     vitamin B-12 (CYANOCOBALAMIN ) 500 MCG tablet Take 500 mcg by mouth daily.     warfarin (COUMADIN ) 5 MG tablet TAKE 1/2 TO 1 (ONE-HALF TO ONE) TABLET BY MOUTH ONCE DAILY AS  DIRECTED  BY  COUMADIN   CLINIC. 100 tablet 0   No current facility-administered medications for this encounter.    ROS- All systems are reviewed and negative except as per the HPI above  Physical Exam: Vitals:   04/02/24 1513  BP: (!) 140/90  Pulse: 64  Weight: 102.6 kg  Height: 5\' 9"  (1.753 m)     GEN: Well nourished, well developed in no acute distress CARDIAC: Regular rate and rhythm, no murmurs, rubs, gallops RESPIRATORY:  Clear to auscultation without rales, wheezing or rhonchi  ABDOMEN: Soft, non-tender, non-distended EXTREMITIES:  No edema; No  deformity    ECG today demonstrates A paced rhythm, RBBB, T wave inv inferior and lateral Vent. rate 64 BPM PR interval 308 ms QRS duration 132 ms QT/QTcB 466/480 ms   Echo 07/24/23  1. Poor windows. very limited study. The EF is grossly normal.. Left  ventricular diastolic parameters are indeterminate.   2. Right ventricular systolic function was not well visualized. The right  ventricular size is not well visualized. Tricuspid regurgitation signal is  inadequate for assessing PA pressure.   3. The mitral valve was not well visualized. No evidence of mitral valve  regurgitation.   4. The aortic valve was not well visualized.    CHA2DS2-VASc Score = 5  The patient's score is based upon: CHF History: 1 HTN History: 1 Diabetes History: 1 Stroke History: 0 Vascular Disease History: 0 Age Score: 2 Gender Score: 0       ASSESSMENT AND PLAN: Persistent Atrial Fibrillation/atrial flutter The patient's CHA2DS2-VASc score is 5, indicating a 7.2% annual risk of stroke.   Patient appears to be maintaining SR Continue dofetilide  250 mcg BID Continue Toprol  100 mg BID Continue warfarin Continue diltiazem  30 mg PRN q 8 hours for heart racing.   Secondary Hypercoagulable State (ICD10:  D68.69) The patient is at significant risk for stroke/thromboembolism based upon his CHA2DS2-VASc Score of 5.  Continue Warfarin (Coumadin ). No bleeding issues.   High Risk Medication Monitoring (ICD 10: Z79.899) QT interval on ECG acceptable for dofetilide  monitoring. Check bmet/mag today.     HTN Stable on current regimen  OSA  Encouraged nightly CPAP  Sinus node dysfunction S/p PPM, followed by Dr Chad [name]   Follow up with EP APP in 3 months. AF clinic in 9 months.    Myrtha Ates PA-C Afib Clinic [facility] [address] [place], [place] [address] [phone]

## 2024-04-02 NOTE — Patient Instructions (Addendum)
 Follow up with Dr Doyle Generous office in 3 months  Follow up in AFib Clinic in 9 months

## 2024-04-03 ENCOUNTER — Ambulatory Visit (HOSPITAL_COMMUNITY): Payer: Self-pay | Admitting: Physician Assistant

## 2024-04-03 DIAGNOSIS — G4733 Obstructive sleep apnea (adult) (pediatric): Secondary | ICD-10-CM | POA: Diagnosis not present

## 2024-04-03 DIAGNOSIS — I4891 Unspecified atrial fibrillation: Secondary | ICD-10-CM | POA: Diagnosis not present

## 2024-04-03 LAB — BASIC METABOLIC PANEL WITH GFR
BUN/Creatinine Ratio: 12 (ref 10–24)
BUN: 14 mg/dL (ref 10–36)
CO2: 25 mmol/L (ref 20–29)
Calcium: 9.5 mg/dL (ref 8.6–10.2)
Chloride: 101 mmol/L (ref 96–106)
Creatinine, Ser: 1.13 mg/dL (ref 0.76–1.27)
Glucose: 92 mg/dL (ref 70–99)
Potassium: 4.2 mmol/L (ref 3.5–5.2)
Sodium: 140 mmol/L (ref 134–144)
eGFR: 61 mL/min/{1.73_m2} (ref >59)

## 2024-04-03 LAB — MAGNESIUM: Magnesium: 2.4 mg/dL — ABNORMAL HIGH (ref 1.6–2.3)

## 2024-04-17 ENCOUNTER — Ambulatory Visit: Attending: Cardiology | Admitting: *Deleted

## 2024-04-17 DIAGNOSIS — I4819 Other persistent atrial fibrillation: Secondary | ICD-10-CM

## 2024-04-17 DIAGNOSIS — Z5181 Encounter for therapeutic drug level monitoring: Secondary | ICD-10-CM

## 2024-04-17 LAB — POCT INR: INR: 2.6 (ref 2.0–3.0)

## 2024-04-17 NOTE — Patient Instructions (Signed)
 Continue 1/2 tablet daily except 1 tablet on Tuesdays Recheck in 6 weeks

## 2024-04-22 DIAGNOSIS — G4733 Obstructive sleep apnea (adult) (pediatric): Secondary | ICD-10-CM | POA: Diagnosis not present

## 2024-04-24 ENCOUNTER — Encounter

## 2024-04-24 ENCOUNTER — Telehealth: Payer: Self-pay | Admitting: Podiatry

## 2024-04-24 NOTE — Telephone Encounter (Signed)
 Patient's daughter called to cancel the [facility] appointment scheduled for 6/12 with Dr. Clydia Dart. Since the next available appointment with the doctor is on 7/17, the daughter requested to be added to the waitlist to maintain an earlier slot.  Staff explained multiple times (with confirmation from lead) that patients cannot be added to the waitlist without having an active appointment. Once the 6/12 appointment is canceled, there is no active appointment to qualify for waitlist placement. This was communicated to the daughter. She then said good by w/o scheduling. Lead was update of the call.

## 2024-04-25 ENCOUNTER — Telehealth: Payer: Self-pay | Admitting: Family Medicine

## 2024-04-25 ENCOUNTER — Ambulatory Visit: Admitting: Podiatry

## 2024-04-25 DIAGNOSIS — Z7189 Other specified counseling: Secondary | ICD-10-CM

## 2024-04-25 DIAGNOSIS — G4739 Other sleep apnea: Secondary | ICD-10-CM

## 2024-04-25 DIAGNOSIS — Z9981 Dependence on supplemental oxygen: Secondary | ICD-10-CM

## 2024-04-25 DIAGNOSIS — I4819 Other persistent atrial fibrillation: Secondary | ICD-10-CM

## 2024-04-25 NOTE — Telephone Encounter (Signed)
 Chad [name]

## 2024-04-25 NOTE — Telephone Encounter (Signed)
 Please attach 90 day report. TY!

## 2024-04-26 NOTE — Telephone Encounter (Signed)
 Cld Pt, No answer, LVM for call back.

## 2024-04-29 ENCOUNTER — Ambulatory Visit: Admitting: Podiatry

## 2024-04-29 DIAGNOSIS — B351 Tinea unguium: Secondary | ICD-10-CM

## 2024-04-29 DIAGNOSIS — M79675 Pain in left toe(s): Secondary | ICD-10-CM

## 2024-04-29 DIAGNOSIS — Z7901 Long term (current) use of anticoagulants: Secondary | ICD-10-CM | POA: Diagnosis not present

## 2024-04-29 DIAGNOSIS — M79674 Pain in right toe(s): Secondary | ICD-10-CM

## 2024-04-29 NOTE — Telephone Encounter (Signed)
 Called daughter Judeen Nose [facility]) on [place] at [phone]. LVM for her to call office.

## 2024-04-29 NOTE — Addendum Note (Signed)
 Addended by: Oneita Bihari on: 04/29/2024 02:51 PM   Modules accepted: Orders

## 2024-04-29 NOTE — Telephone Encounter (Addendum)
 Called and spoke with daughter/pt.  He would like to try and do mask refit via [facility] first. Aware we will fax orders to them today to set up with him. They will call them if they do not hear anything in the next week. Order printed, waiting on signature from Amy.

## 2024-04-29 NOTE — Telephone Encounter (Signed)
 Pt retuning call from Nurse . Pt would like call back

## 2024-04-29 NOTE — Telephone Encounter (Signed)
 Faxed signed order to [facility] at [phone]. Received fax confirmation.

## 2024-04-29 NOTE — Progress Notes (Signed)
 Subjective: Chief Complaint  Patient presents with   RFC    RM#11 RFC     89 y.o. returns the office today for painful, elongated, thickened toenails which he cannot trim himself.  No swelling redness or injury to the toenail sites.  Denies any ulcerations or new concerns today.  He is on Coumadin   PCP: Marvine Rush, MD  Objective: AAO 3, NAD DP/PT pulses palpable, CRT less than 3 seconds Nails hypertrophic, dystrophic, elongated, brittle, discolored 10.  Subungual debris present.  Incurvation present the hallux toenail left side worse than right.  No edema, erythema or signs of infection of the toenail sites. No open lesions or pre-ulcerative lesions are identified. Irritation right 4th interspace without any drainage or purulence noted. No pain with calf compression, swelling, warmth, erythema.  Assessment: Patient presents with symptomatic onychomycosis; chronic anticoagulation  Plan: -Treatment options including alternatives, risks, complications were discussed -Nails sharply debrided 10 without complication/bleeding.   -Discussed drying well between his toes.  Monitor for any signs or symptoms of infection. -Discussed daily foot inspection. If there are any changes, to call the office immediately.   Return in about 3 months (around 07/30/2024).   Donnice Fees, DPM

## 2024-05-02 ENCOUNTER — Telehealth: Payer: Self-pay

## 2024-05-02 NOTE — Telephone Encounter (Signed)
 Transmission received. I let him know the nurse will review it and send it to the A-fib clinic. Someone will give him a call back.

## 2024-05-02 NOTE — Telephone Encounter (Signed)
 Report given to Rondel Coddington, RN in AF clinic. PAtient noted 23 hours of AF although presenting rhythm is now NSR.   Called family to discuss further and states she spoke to Anthony, Charity fundraiser in AF clinic and will keep monitoring at this time.   Aware to send a transmission and call in the future if symptoms arise.

## 2024-05-03 DIAGNOSIS — G4733 Obstructive sleep apnea (adult) (pediatric): Secondary | ICD-10-CM | POA: Diagnosis not present

## 2024-05-04 DIAGNOSIS — I4891 Unspecified atrial fibrillation: Secondary | ICD-10-CM | POA: Diagnosis not present

## 2024-05-04 DIAGNOSIS — G4733 Obstructive sleep apnea (adult) (pediatric): Secondary | ICD-10-CM | POA: Diagnosis not present

## 2024-05-13 DIAGNOSIS — I4891 Unspecified atrial fibrillation: Secondary | ICD-10-CM | POA: Diagnosis not present

## 2024-05-13 DIAGNOSIS — D6869 Other thrombophilia: Secondary | ICD-10-CM | POA: Diagnosis not present

## 2024-05-13 DIAGNOSIS — I130 Hypertensive heart and chronic kidney disease with heart failure and stage 1 through stage 4 chronic kidney disease, or unspecified chronic kidney disease: Secondary | ICD-10-CM | POA: Diagnosis not present

## 2024-05-20 ENCOUNTER — Ambulatory Visit (INDEPENDENT_AMBULATORY_CARE_PROVIDER_SITE_OTHER): Payer: Medicare Other

## 2024-05-20 DIAGNOSIS — I495 Sick sinus syndrome: Secondary | ICD-10-CM

## 2024-05-20 LAB — CUP PACEART REMOTE DEVICE CHECK
Battery Remaining Longevity: 37 mo
Battery Remaining Percentage: 34 %
Battery Voltage: 2.95 V
Brady Statistic AP VP Percent: 24 %
Brady Statistic AP VS Percent: 61 %
Brady Statistic AS VP Percent: 2.5 %
Brady Statistic AS VS Percent: 11 %
Brady Statistic RA Percent Paced: 81 %
Brady Statistic RV Percent Paced: 26 %
Date Time Interrogation Session: 20250707020018
Implantable Lead Connection Status: 753985
Implantable Lead Connection Status: 753985
Implantable Lead Implant Date: 20180808
Implantable Lead Implant Date: 20180808
Implantable Lead Location: 753859
Implantable Lead Location: 753860
Implantable Lead Model: 5076
Implantable Lead Model: 5076
Implantable Pulse Generator Implant Date: 20180808
Lead Channel Impedance Value: 430 Ohm
Lead Channel Impedance Value: 450 Ohm
Lead Channel Pacing Threshold Amplitude: 0.75 V
Lead Channel Pacing Threshold Amplitude: 0.75 V
Lead Channel Pacing Threshold Pulse Width: 0.5 ms
Lead Channel Pacing Threshold Pulse Width: 0.5 ms
Lead Channel Sensing Intrinsic Amplitude: 12 mV
Lead Channel Sensing Intrinsic Amplitude: 3.3 mV
Lead Channel Setting Pacing Amplitude: 2 V
Lead Channel Setting Pacing Amplitude: 2.5 V
Lead Channel Setting Pacing Pulse Width: 0.5 ms
Lead Channel Setting Sensing Sensitivity: 2 mV
Pulse Gen Model: 2272
Pulse Gen Serial Number: 8932564

## 2024-05-21 DIAGNOSIS — M179 Osteoarthritis of knee, unspecified: Secondary | ICD-10-CM | POA: Diagnosis not present

## 2024-05-21 DIAGNOSIS — M159 Polyosteoarthritis, unspecified: Secondary | ICD-10-CM | POA: Diagnosis not present

## 2024-05-21 DIAGNOSIS — E6609 Other obesity due to excess calories: Secondary | ICD-10-CM | POA: Diagnosis not present

## 2024-05-21 DIAGNOSIS — E1159 Type 2 diabetes mellitus with other circulatory complications: Secondary | ICD-10-CM | POA: Diagnosis not present

## 2024-05-21 DIAGNOSIS — Z1331 Encounter for screening for depression: Secondary | ICD-10-CM | POA: Diagnosis not present

## 2024-05-21 DIAGNOSIS — E559 Vitamin D deficiency, unspecified: Secondary | ICD-10-CM | POA: Diagnosis not present

## 2024-05-21 DIAGNOSIS — D518 Other vitamin B12 deficiency anemias: Secondary | ICD-10-CM | POA: Diagnosis not present

## 2024-05-21 DIAGNOSIS — G9332 Myalgic encephalomyelitis/chronic fatigue syndrome: Secondary | ICD-10-CM | POA: Diagnosis not present

## 2024-05-21 DIAGNOSIS — R2681 Unsteadiness on feet: Secondary | ICD-10-CM | POA: Diagnosis not present

## 2024-05-21 DIAGNOSIS — Z6833 Body mass index (BMI) 33.0-33.9, adult: Secondary | ICD-10-CM | POA: Diagnosis not present

## 2024-05-21 DIAGNOSIS — Z0001 Encounter for general adult medical examination with abnormal findings: Secondary | ICD-10-CM | POA: Diagnosis not present

## 2024-05-21 DIAGNOSIS — Z125 Encounter for screening for malignant neoplasm of prostate: Secondary | ICD-10-CM | POA: Diagnosis not present

## 2024-05-22 DIAGNOSIS — G4733 Obstructive sleep apnea (adult) (pediatric): Secondary | ICD-10-CM | POA: Diagnosis not present

## 2024-05-24 ENCOUNTER — Encounter: Payer: Self-pay | Admitting: *Deleted

## 2024-05-29 ENCOUNTER — Encounter

## 2024-05-31 DIAGNOSIS — J8410 Pulmonary fibrosis, unspecified: Secondary | ICD-10-CM | POA: Diagnosis not present

## 2024-05-31 DIAGNOSIS — I5032 Chronic diastolic (congestive) heart failure: Secondary | ICD-10-CM | POA: Diagnosis not present

## 2024-05-31 DIAGNOSIS — Z515 Encounter for palliative care: Secondary | ICD-10-CM | POA: Diagnosis not present

## 2024-06-03 ENCOUNTER — Encounter

## 2024-06-03 DIAGNOSIS — G4733 Obstructive sleep apnea (adult) (pediatric): Secondary | ICD-10-CM | POA: Diagnosis not present

## 2024-06-03 DIAGNOSIS — I4891 Unspecified atrial fibrillation: Secondary | ICD-10-CM | POA: Diagnosis not present

## 2024-06-05 ENCOUNTER — Ambulatory Visit: Attending: Cardiology | Admitting: *Deleted

## 2024-06-05 DIAGNOSIS — I4819 Other persistent atrial fibrillation: Secondary | ICD-10-CM | POA: Diagnosis not present

## 2024-06-05 DIAGNOSIS — Z5181 Encounter for therapeutic drug level monitoring: Secondary | ICD-10-CM

## 2024-06-05 LAB — POCT INR: INR: 1.7 — AB (ref 2.0–3.0)

## 2024-06-05 NOTE — Progress Notes (Signed)
 Please see anticoagulation encounter 1.7

## 2024-06-05 NOTE — Patient Instructions (Addendum)
 Take warfarin 1 1/2 tablets tonight then resume 1/2 tablet daily except 1 tablet on Tuesdays Recheck in 3 weeks

## 2024-06-14 ENCOUNTER — Other Ambulatory Visit: Payer: Self-pay | Admitting: *Deleted

## 2024-06-14 DIAGNOSIS — R222 Localized swelling, mass and lump, trunk: Secondary | ICD-10-CM

## 2024-06-15 ENCOUNTER — Ambulatory Visit: Payer: Self-pay | Admitting: Cardiology

## 2024-06-18 ENCOUNTER — Encounter: Payer: Self-pay | Admitting: General Surgery

## 2024-06-18 ENCOUNTER — Ambulatory Visit: Admitting: General Surgery

## 2024-06-18 VITALS — BP 126/67 | HR 64 | Temp 97.8°F | Resp 18 | Ht 69.0 in | Wt 221.0 lb

## 2024-06-18 DIAGNOSIS — L723 Sebaceous cyst: Secondary | ICD-10-CM | POA: Insufficient documentation

## 2024-06-18 NOTE — Patient Instructions (Signed)
 Call if changes or signs of infection. We can always prescribe antibiotics and plan for incision if needed.

## 2024-06-18 NOTE — Progress Notes (Unsigned)
 [facility] History and Physical  Reason for Referral:*** Referring Physician: ***  Chief Complaint   New Patient (Initial Visit)     Chad [name] is a 89 y.o. male.  HPI:   Discussed the use of AI scribe software for clinical note transcription with the patient, who gave verbal consent to proceed.  History of Present Illness      ***.  The *** started *** and has had a duration of ***.  It is associated with ***.  The *** is improved with ***, and is made worse with ***.    Quality*** Context***  Past Medical History:  Diagnosis Date   Bladder cancer (HCC) 2010   CHF (congestive heart failure) (HCC)    sept 2024   Chronic bronchitis (HCC)    Hypertension    Hypoglycemia    On home oxygen  therapy    2L w/CPAP at night (06/21/2017)   OSA on CPAP    w/2L O2 at night (06/21/2017)   Persistent atrial fibrillation (HCC)        Pneumonia    when he was young; again in 2016 (06/21/2017)   Presence of permanent cardiac pacemaker    Sensory ataxia     Past Surgical History:  Procedure Laterality Date   BACK SURGERY     CARDIOVERSION N/A 04/23/2015   Procedure: CARDIOVERSION;  Surgeon: Vina Okey GAILS, MD;  Location: [facility] ENDOSCOPY;  Service: Cardiovascular;  Laterality: N/A;   CARDIOVERSION N/A 07/30/2015   Procedure: CARDIOVERSION;  Surgeon: Oneil JAYSON Parchment, MD;  Location: [facility] ENDOSCOPY;  Service: Cardiovascular;  Laterality: N/A;   EYE SURGERY Bilateral 2009   to lower pressure; no glaucoma (06/21/2017)   INSERT / REPLACE / REMOVE PACEMAKER  06/21/2017   LUMBAR DISC SURGERY  1987   PACEMAKER IMPLANT N/A 06/21/2017   Procedure: Pacemaker Implant;  Surgeon: Fernande Elspeth JAYSON, MD;  Location: [facility] INVASIVE CV LAB;  Service: Cardiovascular;  Laterality: N/A;   TRANSURETHRAL RESECTION OF BLADDER TUMOR WITH GYRUS (TURBT-GYRUS)  07/10/2009    Cystourethroscopy, gyrus TURBT (transurethral  resection of bladder tumor).thelbert 03/16/2011    Family History  Problem  Relation Age of Onset   Dementia Mother    Heart failure Father    Hypertension Father    Sleep apnea Brother     Social History   Tobacco Use   Smoking status: Former    Current packs/day: 0.00    Average packs/day: 0.5 packs/day for 20.0 years (10.0 ttl pk-yrs)    Types: Cigarettes    Start date: 06/03/1951    Quit date: 06/03/1971    Years since quitting: 53.0    Passive exposure: Past   Smokeless tobacco: Never   Tobacco comments:    Former smoker 08/16/22  Vaping Use   Vaping status: Never Used  Substance Use Topics   Alcohol use: Yes    Comment: occ   Drug use: No    Medications: {medication reviewed/display:3041432} Allergies as of 06/18/2024       Reactions   Doxycycline  Hives   Amiodarone     Caused neuropathy   Carbapenems    Cephalexin  Other (See Comments)   Cephalosporins Other (See Comments)   Chlorpheniramine Other (See Comments)   Swells prostate   Ciprofloxacin    Pt can not take with amiodarone     Decongestant [oxymetazoline]    All decongestant - makes prostate swell   Diltiazem  Swelling   Left leg swelling   Flagyl [metronidazole] Nausea Only   Stomach ache  Hctz [hydrochlorothiazide]    Levaquin [levofloxacin In D5w] Other (See Comments)   unknown   Levofloxacin Nausea Only   Lidocaine     Pt unsure of   Penicillins Swelling   Teline [tetracycline] Swelling   Hospitalization for 2 weeks, prostate swelling   Tetanus Toxoid Swelling   Swells arms, Please pre-medicate with benadryl.   Z-pak [azithromycin] Other (See Comments)   Pt can not take with Amiodarone     Sulfonamide Derivatives Rash   Toprol  Xl [metoprolol  Tartrate] Itching, Rash   Pt states he tolerates.        Medication List        Accurate as of June 18, 2024 10:38 AM. If you have any questions, ask your nurse or doctor.          acetaminophen  650 MG CR tablet Commonly known as: TYLENOL  Take 650 mg by mouth daily.   CENTRUM SILVER  PO Take 1 tablet by mouth  daily.   diltiazem  30 MG tablet Commonly known as: CARDIZEM  TAKE 1 TABLET BY MOUTH EVERY 8 HOURS AS NEEDED   dofetilide  250 MCG capsule Commonly known as: TIKOSYN  Take 1 capsule by mouth twice daily   escitalopram  10 MG tablet Commonly known as: LEXAPRO  Take 10 mg by mouth at bedtime.   furosemide  20 MG tablet Commonly known as: LASIX  Take 1 tablet (20 mg total) by mouth 2 (two) times daily.   metoprolol  succinate 100 MG 24 hr tablet Commonly known as: TOPROL -XL Take 1 tablet (100 mg total) by mouth 2 (two) times daily.   OXYGEN  Inhale 2 L into the lungs at bedtime. Uses with CPAP machine   triamcinolone ointment 0.1 % Commonly known as: KENALOG Apply 1 Application topically in the morning and at bedtime.   vitamin B-12 500 MCG tablet Commonly known as: CYANOCOBALAMIN  Take 500 mcg by mouth daily.   Vitamin D3 50 MCG (2000 UT) capsule Take 2,000 Units by mouth 2 (two) times daily.   warfarin 5 MG tablet Commonly known as: COUMADIN  Take as directed by the anticoagulation clinic. If you are unsure how to take this medication, talk to your nurse or doctor. Original instructions: TAKE 1/2 TO 1 (ONE-HALF TO ONE) TABLET BY MOUTH ONCE DAILY AS  DIRECTED  BY  COUMADIN   CLINIC.         ROS:  {Review of Systems:30496}  Blood pressure 126/67, pulse 64, temperature 97.8 F (36.6 C), temperature source Oral, resp. rate 18, height 5' 9 (1.753 m), weight 221 lb (100.2 kg). Physical Exam Physical Exam   Results: No results found for this or any previous visit (from the past 48 hours).  No results found.   Assessment and Plan: Assessment and Plan Assessment & Plan      Chad [name] is a 89 y.o. male with *** -*** -*** -Follow up ***  All questions were answered to the satisfaction of the patient and family***.  The risk and benefits of *** were discussed including but not limited to ***.  After careful consideration, Chad [name] has decided to ***.     Manuelita JAYSON Pander 06/18/2024, 10:38 AM

## 2024-06-20 NOTE — Progress Notes (Unsigned)
 Cardiology Office Note:   Date:  06/21/2024  ID:  Chad [name], DOB [birth year], MRN [id number] PCP: [facility]  [facility] Providers Cardiologist:  Lynwood Schilling, MD {  History of Present Illness:   Chad [name] is a 89 y.o. male who presents for follow up of atrial fib.   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  He has been treated for SVT as well by Dr. Fernande.    He is followed in the Atrial Fib clinic.  He is being treated with Tikosyn .   He has had subdural hematoma from a fall earlier in 2024.  He has done well since I last saw him.  The last head CT and this seems to be improved with smaller hematomas.     Recently discharged on 07/25/2023 for respiratory failure, and diastolic CHF.  Discharged home on O2 2/L , has been diuresed, sent home on lasix  20 mg BID. Continue CPAP at night.  The patient is now being followed by pulmonary palliative care.   Echocardiogram dated 07/24/2023 revealed poor windows with a very limited study, the EF was found to be normal.     Since I last saw him he has done relatively well.  He wears his oxygen  most of the time particularly if he is out being active.  He wears O2 through CPAP at night.  He uses a walker. The patient denies any new symptoms such as chest discomfort, neck or arm discomfort. There has been no new shortness of breath, PND or orthopnea. There have been no reported palpitations, presyncope or syncope.  He has not had any symptomatic tachypalpitations.    ROS: As stated in the HPI and negative for all other systems.  Studies Reviewed:    EKG:     NA  Risk Assessment/Calculations:    CHA2DS2-VASc Score = 5   This indicates a 7.2% annual risk of stroke. The patient's score is based upon: CHF History: 1 HTN History: 1 Diabetes History: 1 Stroke History: 0 Vascular Disease History: 0 Age Score: 2 Gender Score: 0  Physical Exam:   VS:  BP 134/70 (BP Location: Left Arm, Patient  Position: Sitting, Cuff Size: Large)   Pulse 65   Ht 5' 9 (1.753 m)   Wt 221 lb (100.2 kg)   SpO2 94%   BMI 32.64 kg/m    Wt Readings from Last 3 Encounters:  06/21/24 221 lb (100.2 kg)  06/18/24 221 lb (100.2 kg)  04/02/24 226 lb 3.2 oz (102.6 kg)     GEN: Well nourished, well developed in no acute distress NECK: No JVD; No carotid bruits CARDIAC: RRR, no murmurs, rubs, gallops RESPIRATORY:  Clear to auscultation without rales, wheezing or rhonchi  ABDOMEN: Soft, non-tender, non-distended EXTREMITIES:  No edema; No deformity   ASSESSMENT AND PLAN:   SOB: The patient's shortness of breath seems to be baseline and well compensated.  No change in therapy.  ATRIAL FIB: He tolerates anticoagulation.  I reviewed his device interrogation and he has very little atrial fibrillation burden.  He is up-to-date with blood work for his Tikosyn  including a recent basic metabolic profile and magnesium  level.  No change in therapy.    HTN: Blood pressure is at target.  No change in therapy.   HFpEF:   He seems to be euvolemic.  No change in therapy.  He is up-to-date with blood work.    SLEEP APNEA:    He tolerates CPAP.  Follow up with me in 6 months  Signed, Lynwood Schilling, MD

## 2024-06-21 ENCOUNTER — Encounter: Payer: Self-pay | Admitting: Cardiology

## 2024-06-21 ENCOUNTER — Ambulatory Visit: Attending: Cardiology | Admitting: Cardiology

## 2024-06-21 VITALS — BP 134/70 | HR 65 | Ht 69.0 in | Wt 221.0 lb

## 2024-06-21 DIAGNOSIS — I10 Essential (primary) hypertension: Secondary | ICD-10-CM

## 2024-06-21 DIAGNOSIS — I4819 Other persistent atrial fibrillation: Secondary | ICD-10-CM

## 2024-06-21 NOTE — Patient Instructions (Signed)

## 2024-06-22 DIAGNOSIS — G4733 Obstructive sleep apnea (adult) (pediatric): Secondary | ICD-10-CM | POA: Diagnosis not present

## 2024-07-01 ENCOUNTER — Ambulatory Visit (INDEPENDENT_AMBULATORY_CARE_PROVIDER_SITE_OTHER): Admitting: Orthopedic Surgery

## 2024-07-01 ENCOUNTER — Ambulatory Visit (INDEPENDENT_AMBULATORY_CARE_PROVIDER_SITE_OTHER): Payer: Self-pay

## 2024-07-01 VITALS — BP 116/71 | HR 64 | Ht 69.0 in | Wt 224.6 lb

## 2024-07-01 DIAGNOSIS — L409 Psoriasis, unspecified: Secondary | ICD-10-CM | POA: Insufficient documentation

## 2024-07-01 DIAGNOSIS — G8929 Other chronic pain: Secondary | ICD-10-CM

## 2024-07-01 DIAGNOSIS — M238X1 Other internal derangements of right knee: Secondary | ICD-10-CM | POA: Diagnosis not present

## 2024-07-01 DIAGNOSIS — M1711 Unilateral primary osteoarthritis, right knee: Secondary | ICD-10-CM

## 2024-07-01 NOTE — Progress Notes (Signed)
  Intake history:  Ht 5' 9 (1.753 m)   Wt 224 lb 9.6 oz (101.9 kg)   BMI 33.17 kg/m  Body mass index is 33.17 kg/m.    WHAT ARE WE SEEING YOU FOR TODAY?   right knee(s)  How Chad [name] has this bothered you? (DOI?DOS?WS?)  1 year(s) ago  Was there an injury? Yes- had an injury many years ago but fell  yr ago and bothering me more since then  Anticoag.  Yes  Diabetes No  Heart disease Yes- afib pacemaker  Hypertension NO  SMOKING HX No  Kidney disease No  Any ALLERGIES ______________________________________________   Treatment:  Have you taken:  Tylenol  Yes  Advil No  Had PT No  Had injection No  Other  _________________________

## 2024-07-01 NOTE — Progress Notes (Unsigned)
 Electrophysiology Office Note:   Date:  07/03/2024  ID:  [name], DOB 04/15/1929, MRN [id number]  Primary Cardiologist: Lynwood Schilling, MD Primary Heart Failure: None Electrophysiologist: Will Gladis Norton, MD       History of Present Illness:   Chad [name] is a 89 y.o. male with h/o SND s/p PPM, AF, HTN, NSVT, falls (SDH), OSA on CPAP with O2 seen today for routine electrophysiology followup.   Since last being seen in our clinic the patient reports doing well. His daughter accompanies him. He does not drive anymore.  He lives independently otherwise. No device related concerns.  Asks about how long he has on his battery and what that process involves.     He denies chest pain, palpitations, dyspnea, PND, orthopnea, nausea, vomiting, dizziness, syncope, edema, weight gain, or early satiety.   Review of systems complete and found to be negative unless listed in HPI.   EP Information / Studies Reviewed:    EKG is ordered today. Personal review as below.  EKG Interpretation Date/Time:  Wednesday July 03 2024 15:11:46 EDT Ventricular Rate:  61 PR Interval:  332 QRS Duration:  134 QT Interval:  464 QTC Calculation: 467 R Axis:   80  Text Interpretation: Atrial-paced rhythm with prolonged AV conduction Right bundle branch block Confirmed by Aniceto Jarvis ([id number]) on 07/03/2024 3:25:09 PM   PPM Interrogation-  reviewed in detail today,  See PACEART report.  Device History: Abbott Dual Chamber PPM implanted 06/21/17 for Sinus Node Dysfunction  Risk Assessment/Calculations:    CHA2DS2-VASc Score = 5   This indicates a 7.2% annual risk of stroke. The patient's score is based upon: CHF History: 1 HTN History: 1 Diabetes History: 1 Stroke History: 0 Vascular Disease History: 0 Age Score: 2 Gender Score: 0             Physical Exam:   VS:  BP 128/70   Pulse 61   Ht 5' 9 (1.753 m)   Wt 226 lb (102.5 kg)   SpO2 92%   BMI 33.37 kg/m    Wt Readings from Last  3 Encounters:  07/03/24 226 lb (102.5 kg)  07/01/24 224 lb 9.6 oz (101.9 kg)  06/21/24 221 lb (100.2 kg)     GEN: Well nourished, well developed in no acute distress NECK: No JVD; No carotid bruits CARDIAC: Regular rate and rhythm, no murmurs, rubs, gallops, device site wnl / no tethering  RESPIRATORY:  Clear to auscultation without rales, wheezing or rhonchi  ABDOMEN: Soft, non-tender, non-distended EXTREMITIES:  No edema; No deformity   ASSESSMENT AND PLAN:    SND s/p Abbott PPM  Sinus Bradycardia with 1st Degree AVB Atrial Tachycardia  -Normal PPM function -See Pace Art report -No changes today  Atrial Fibrillation  High Risk Medication Monitoring: Tikosyn  CHA2DS2-VASc 5 -<1% AF burden on device > EGM's reviewed show AT all <30 seconds  -EKG with NSR, QTC 467 ms -update Tikosyn  labs > BMP, Mg+ -continue Tikosyn  250 mcg BID   Secondary Hypercoagulable State  -continue Coumadin  -follows in [place] [place] [facility]  -last INR 2 on 07/02/24  HFpEF  -euvolemic on exam    Hypertension  -well controlled on current regimen    NSVT  -no burden on device   Disposition:   Follow up with Dr. Norton 4 months to meet Dr. Norton (from [facility]) for Tikosyn  follow up > will not need device check   Signed, Jarvis Aniceto, NP-C, AGACNP-BC [facility] - Electrophysiology  07/03/2024, 5:48 PM

## 2024-07-01 NOTE — Patient Instructions (Signed)
 Wear brace

## 2024-07-01 NOTE — Progress Notes (Signed)
 Office Visit Note   Patient: Chad [name]           Date of Birth: [birth year]           MRN: [id number] Visit Date: 07/01/2024 Requested by: Bertell Satterfield, MD [address] [place],  [place] [address] PCP: [facility]   Assessment & Plan:   Encounter Diagnoses  Name Primary?   Chronic pain of right knee Yes   Knee joint laxity, right    Primary osteoarthritis of right knee     No orders of the defined types were placed in this encounter.   89 year old male on oxygen  with atrial fibrillation complains of his right knee gives way.  He is not a surgical candidate there is not much I can do in this situation I recommend he use his brace more often follow-up as needed   Subjective: Chief Complaint  Patient presents with   Knee Pain    Right- feels like it is going out has to use walker    HPI: 89 year old male presents for evaluation and management of ongoing symptoms in his right knee.  It feels like his going to give out.  He feels like it is slipping inside.  No recent trauma symptoms worse over the last year remote trauma also noted              ROS: Shortness of breath atrial fibrillation   Images personally read and my interpretation : Internal imaging shows mild varus mild to moderate narrowing medial compartment minimal secondary bone changes consistent with osteoarthritis  Visit Diagnoses:  1. Chronic pain of right knee   2. Knee joint laxity, right   3. Primary osteoarthritis of right knee      Follow-Up Instructions: Return for NO FU SCHEDULED.    Objective: Vital Signs: BP 116/71   Pulse 64   Ht 5' 9 (1.753 m)   Wt 224 lb 9.6 oz (101.9 kg)   BMI 33.17 kg/m   Physical Exam 89 year old male who is awake and alert he is got his oxygen  on he is got his walker.  Grooming hygiene normal.  Weight height BMI 33.    Ortho Exam Right knee  Skin no erythema or open wounds Tenderness medial compartment No  effusion No laxity Normal extensor mechanism Functional range of motion  Specialty Comments:  No specialty comments available.  Imaging: No results found.   PMFS History: Patient Active Problem List   Diagnosis Date Noted   Psoriasis 07/01/2024   Sebaceous cyst 06/18/2024   Acute exacerbation of idiopathic pulmonary fibrosis (HCC) 07/23/2023   Localized swelling of left lower extremity 07/23/2023   Chronic atrial fibrillation (HCC) 03/29/2023   Chronic respiratory failure with hypoxia (HCC) 01/31/2023   NSVT (nonsustained ventricular tachycardia) (HCC) 12/08/2022   Atrial tachycardia (HCC) 12/08/2022   Secondary hypercoagulable state (HCC) 08/16/2022   SOB (shortness of breath) 04/21/2021   Difficulty with adaptive servo-ventilation (ASV) use 04/07/2021   Supplemental oxygen  dependent 04/07/2021   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   RBD (REM behavioral disorder) 04/07/2021   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   Interstitial lung disease (HCC) 12/02/2020   Chronic intermittent hypoxia with obstructive sleep apnea 12/02/2020   Excessive daytime sleepiness 12/02/2020   Cardiac pacemaker in situ -STJ 09/03/2020   Educated about COVID-19 virus infection 07/05/2020   Bradycardia 07/05/2020   Renal cyst 05/04/2020   PAF (paroxysmal atrial fibrillation) (HCC) 02/24/2020   SVT (supraventricular  tachycardia) (HCC) 02/24/2020   Flank pain 01/26/2020   Tachycardia-bradycardia syndrome (HCC) 06/21/2017   Right-sided epistaxis 03/16/2017   Warfarin anticoagulation 03/16/2017   Atrial extrasystoles 01/07/2016   Sleep related hypoventilation/hypoxemia in other disease 01/07/2016   OSA on CPAP 01/07/2016   CIS (carcinoma in situ of penis) 01/04/2016   Malignant neoplasm of lateral wall of urinary bladder (HCC) 01/04/2016   Persistent atrial fibrillation (HCC) 07/28/2015   Acute on chronic diastolic heart failure (HCC) 05/05/2015   Atrial flutter  (HCC)    Atrial flutter (HCC) 03/13/2015   Encounter for therapeutic drug monitoring 12/25/2013   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   Tinnitus of both ears 09/25/2013   Nystagmus, end-position 09/25/2013   Neuropathy 09/25/2013   Hypersomnia with sleep apnea 09/25/2013   Tinnitus aurium 09/25/2013   Long term current use of anticoagulant 02/01/2011   HYPOGLYCEMIA 07/02/2009   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   Essential hypertension 07/02/2009   Atrial fibrillation, persistent (HCC) 07/02/2009   Past Medical History:  Diagnosis Date   Bladder cancer (HCC) 2010   CHF (congestive heart failure) (HCC)    sept 2024   Chronic bronchitis (HCC)    Hypertension    Hypoglycemia    On home oxygen  therapy    2L w/CPAP at night (06/21/2017)   OSA on CPAP    w/2L O2 at night (06/21/2017)   Persistent atrial fibrillation (HCC)        Pneumonia    when he was young; again in 2016 (06/21/2017)   Presence of permanent cardiac pacemaker    Sensory ataxia     Family History  Problem Relation Age of Onset   Dementia Mother    Heart failure Father    Hypertension Father    Sleep apnea Brother     Past Surgical History:  Procedure Laterality Date   BACK SURGERY     CARDIOVERSION N/A 04/23/2015   Procedure: CARDIOVERSION;  Surgeon: Vina Okey GAILS, MD;  Location: [facility] ENDOSCOPY;  Service: Cardiovascular;  Laterality: N/A;   CARDIOVERSION N/A 07/30/2015   Procedure: CARDIOVERSION;  Surgeon: Oneil JAYSON Parchment, MD;  Location: [facility] ENDOSCOPY;  Service: Cardiovascular;  Laterality: N/A;   EYE SURGERY Bilateral 2009   to lower pressure; no glaucoma (06/21/2017)   INSERT / REPLACE / REMOVE PACEMAKER  06/21/2017   LUMBAR DISC SURGERY  1987   PACEMAKER IMPLANT N/A 06/21/2017   Procedure: Pacemaker Implant;  Surgeon: Fernande Elspeth JAYSON, MD;  Location: [facility] INVASIVE CV LAB;  Service: Cardiovascular;  Laterality: N/A;   TRANSURETHRAL RESECTION OF BLADDER TUMOR WITH GYRUS (TURBT-GYRUS)  07/10/2009    Cystourethroscopy, gyrus  TURBT (transurethral  resection of bladder tumor).thelbert 03/16/2011   Social History   Occupational History    Employer: RETIRED  Tobacco Use   Smoking status: Former    Current packs/day: 0.00    Average packs/day: 0.5 packs/day for 20.0 years (10.0 ttl pk-yrs)    Types: Cigarettes    Start date: 06/03/1951    Quit date: 06/03/1971    Years since quitting: 53.1    Passive exposure: Past   Smokeless tobacco: Never   Tobacco comments:    Former smoker 08/16/22  Vaping Use   Vaping status: Never Used  Substance and Sexual Activity   Alcohol use: Yes    Comment: occ   Drug use: No   Sexual activity: Not on file

## 2024-07-02 ENCOUNTER — Ambulatory Visit: Attending: Cardiology | Admitting: *Deleted

## 2024-07-02 DIAGNOSIS — I4819 Other persistent atrial fibrillation: Secondary | ICD-10-CM

## 2024-07-02 DIAGNOSIS — Z5181 Encounter for therapeutic drug level monitoring: Secondary | ICD-10-CM | POA: Diagnosis not present

## 2024-07-02 LAB — POCT INR: INR: 2 (ref 2.0–3.0)

## 2024-07-02 NOTE — Progress Notes (Signed)
 INR 2.0 Please see anticoagulation encounter.

## 2024-07-02 NOTE — Patient Instructions (Signed)
Continue warfarin 1/2 tablet daily except 1 tablet on Tuesdays ?Recheck in 4 weeks ?

## 2024-07-03 ENCOUNTER — Encounter: Payer: Self-pay | Admitting: Pulmonary Disease

## 2024-07-03 ENCOUNTER — Ambulatory Visit: Attending: Cardiology | Admitting: Pulmonary Disease

## 2024-07-03 VITALS — BP 128/70 | HR 61 | Ht 69.0 in | Wt 226.0 lb

## 2024-07-03 DIAGNOSIS — Z79899 Other long term (current) drug therapy: Secondary | ICD-10-CM | POA: Diagnosis not present

## 2024-07-03 DIAGNOSIS — I4819 Other persistent atrial fibrillation: Secondary | ICD-10-CM | POA: Diagnosis not present

## 2024-07-03 DIAGNOSIS — D6869 Other thrombophilia: Secondary | ICD-10-CM

## 2024-07-03 DIAGNOSIS — Z950 Presence of cardiac pacemaker: Secondary | ICD-10-CM | POA: Diagnosis not present

## 2024-07-03 DIAGNOSIS — I495 Sick sinus syndrome: Secondary | ICD-10-CM | POA: Diagnosis not present

## 2024-07-03 DIAGNOSIS — Z5181 Encounter for therapeutic drug level monitoring: Secondary | ICD-10-CM

## 2024-07-03 LAB — CUP PACEART INCLINIC DEVICE CHECK
Date Time Interrogation Session: 20250820174910
Implantable Lead Connection Status: 753985
Implantable Lead Connection Status: 753985
Implantable Lead Implant Date: 20180808
Implantable Lead Implant Date: 20180808
Implantable Lead Location: 753859
Implantable Lead Location: 753860
Implantable Lead Model: 5076
Implantable Lead Model: 5076
Implantable Pulse Generator Implant Date: 20180808
Pulse Gen Model: 2272
Pulse Gen Serial Number: 8932564

## 2024-07-03 MED ORDER — DOFETILIDE 250 MCG PO CAPS: 250.0000 ug | ORAL_CAPSULE | Freq: Two times a day (BID) | ORAL | 3 refills | Status: AC

## 2024-07-03 NOTE — Patient Instructions (Addendum)
 Medication Instructions:  Your physician recommends that you continue on your current medications as directed. Please refer to the Current Medication list given to you today.  *If you need a refill on your cardiac medications before your next appointment, please call your pharmacy*  Lab Work: BMET, MAG-TODAY If you have labs (blood work) drawn today and your tests are completely normal, you will receive your results only by: MyChart Message (if you have MyChart) OR A paper copy in the mail If you have any lab test that is abnormal or we need to change your treatment, we will call you to review the results.  Follow-Up: At [facility], you and your health needs are our priority.  As part of our continuing mission to provide you with exceptional heart care, our providers are all part of one team.  This team includes your primary Cardiologist (physician) and Advanced Practice Providers or APPs (Physician Assistants and Nurse Practitioners) who all work together to provide you with the care you need, when you need it.  Your next appointment:   4 month(s)  Provider:   Soyla Norton, MD

## 2024-07-04 ENCOUNTER — Ambulatory Visit: Payer: Self-pay | Admitting: Pulmonary Disease

## 2024-07-04 DIAGNOSIS — I4891 Unspecified atrial fibrillation: Secondary | ICD-10-CM | POA: Diagnosis not present

## 2024-07-04 LAB — BASIC METABOLIC PANEL WITH GFR
BUN/Creatinine Ratio: 13 (ref 10–24)
BUN: 16 mg/dL (ref 10–36)
CO2: 28 mmol/L (ref 20–29)
Calcium: 9.3 mg/dL (ref 8.6–10.2)
Chloride: 101 mmol/L (ref 96–106)
Creatinine, Ser: 1.2 mg/dL (ref 0.76–1.27)
Glucose: 88 mg/dL (ref 70–99)
Potassium: 4.8 mmol/L (ref 3.5–5.2)
Sodium: 141 mmol/L (ref 134–144)
eGFR: 56 mL/min/1.73 — ABNORMAL LOW (ref >59)

## 2024-07-04 LAB — MAGNESIUM: Magnesium: 2.3 mg/dL (ref 1.6–2.3)

## 2024-07-05 ENCOUNTER — Ambulatory Visit
Admission: RE | Admit: 2024-07-05 | Discharge: 2024-07-05 | Disposition: A | Discharge status: Home / Self Care | Attending: Family Medicine | Admitting: Family Medicine

## 2024-07-05 VITALS — BP 133/70 | HR 64 | Resp 16

## 2024-07-05 DIAGNOSIS — H6991 Unspecified Eustachian tube disorder, right ear: Secondary | ICD-10-CM

## 2024-07-05 DIAGNOSIS — J3489 Other specified disorders of nose and nasal sinuses: Secondary | ICD-10-CM | POA: Diagnosis not present

## 2024-07-05 MED ORDER — AZELASTINE HCL 0.1 % NA SOLN: 1.0000 | Freq: Two times a day (BID) | NASAL | 0 refills | Status: AC

## 2024-07-05 NOTE — ED Triage Notes (Signed)
 Pt states right ear pain for the past week.

## 2024-07-05 NOTE — ED Provider Notes (Signed)
 [facility]    CSN: [id number] Arrival date & time: 07/05/24  9040      History   Chief Complaint Chief Complaint  Patient presents with   Ear Fullness    HPI Chad [name] is a 89 y.o. male.   Patient presenting today with 1 week history of right ear pain that waxes and wanes.  Also has some sinus pressure and drainage.  Denies fever, chills, cough, bleeding or drainage from the ear.  So far not trying anything over-the-counter for symptoms.    Past Medical History:  Diagnosis Date   Bladder cancer (HCC) 2010   CHF (congestive heart failure) (HCC)    sept 2024   Chronic bronchitis (HCC)    Hypertension    Hypoglycemia    On home oxygen  therapy    2L w/CPAP at night (06/21/2017)   OSA on CPAP    w/2L O2 at night (06/21/2017)   Persistent atrial fibrillation (HCC)        Pneumonia    when he was young; again in 2016 (06/21/2017)   Presence of permanent cardiac pacemaker    Sensory ataxia     Patient Active Problem List   Diagnosis Date Noted   Psoriasis 07/01/2024   Sebaceous cyst 06/18/2024   Acute exacerbation of idiopathic pulmonary fibrosis (HCC) 07/23/2023   Localized swelling of left lower extremity 07/23/2023   Chronic atrial fibrillation (HCC) 03/29/2023   Chronic respiratory failure with hypoxia (HCC) 01/31/2023   NSVT (nonsustained ventricular tachycardia) (HCC) 12/08/2022   Atrial tachycardia (HCC) 12/08/2022   Secondary hypercoagulable state (HCC) 08/16/2022   SOB (shortness of breath) 04/21/2021   Difficulty with adaptive servo-ventilation (ASV) use 04/07/2021   Supplemental oxygen  dependent 04/07/2021   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   RBD (REM behavioral disorder) 04/07/2021   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   Interstitial lung disease (HCC) 12/02/2020   Chronic intermittent hypoxia with obstructive sleep apnea 12/02/2020   Excessive daytime sleepiness 12/02/2020   Cardiac pacemaker in  situ -STJ 09/03/2020   Educated about COVID-19 virus infection 07/05/2020   Bradycardia 07/05/2020   Renal cyst 05/04/2020   PAF (paroxysmal atrial fibrillation) (HCC) 02/24/2020   SVT (supraventricular tachycardia) (HCC) 02/24/2020   Flank pain 01/26/2020   Tachycardia-bradycardia syndrome (HCC) 06/21/2017   Right-sided epistaxis 03/16/2017   Warfarin anticoagulation 03/16/2017   Atrial extrasystoles 01/07/2016   Sleep related hypoventilation/hypoxemia in other disease 01/07/2016   OSA on CPAP 01/07/2016   CIS (carcinoma in situ of penis) 01/04/2016   Malignant neoplasm of lateral wall of urinary bladder (HCC) 01/04/2016   Persistent atrial fibrillation (HCC) 07/28/2015   Acute on chronic diastolic heart failure (HCC) 05/05/2015   Atrial flutter (HCC)    Atrial flutter (HCC) 03/13/2015   Encounter for therapeutic drug monitoring 12/25/2013   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   Tinnitus of both ears 09/25/2013   Nystagmus, end-position 09/25/2013   Neuropathy 09/25/2013   Hypersomnia with sleep apnea 09/25/2013   Tinnitus aurium 09/25/2013   Long term current use of anticoagulant 02/01/2011   HYPOGLYCEMIA 07/02/2009   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   Essential hypertension 07/02/2009   Atrial fibrillation, persistent (HCC) 07/02/2009    Past Surgical History:  Procedure Laterality Date   BACK SURGERY     CARDIOVERSION N/A 04/23/2015   Procedure: CARDIOVERSION;  Surgeon: Vina Okey GAILS, MD;  Location: [facility] ENDOSCOPY;  Service: Cardiovascular;  Laterality: N/A;   CARDIOVERSION N/A 07/30/2015   Procedure: CARDIOVERSION;  Surgeon: Oneil BROCKS  Jeffrie, MD;  Location: [facility] ENDOSCOPY;  Service: Cardiovascular;  Laterality: N/A;   EYE SURGERY Bilateral 2009   to lower pressure; no glaucoma (06/21/2017)   INSERT / REPLACE / REMOVE PACEMAKER  06/21/2017   LUMBAR DISC SURGERY  1987   PACEMAKER IMPLANT N/A 06/21/2017   Procedure: Pacemaker Implant;  Surgeon: Fernande Elspeth BROCKS, MD;  Location: [facility] INVASIVE CV  LAB;  Service: Cardiovascular;  Laterality: N/A;   TRANSURETHRAL RESECTION OF BLADDER TUMOR WITH GYRUS (TURBT-GYRUS)  07/10/2009    Cystourethroscopy, gyrus TURBT (transurethral  resection of bladder tumor).thelbert 03/16/2011       Home Medications    Prior to Admission medications   Medication Sig Start Date End Date Taking? Authorizing Provider  azelastine  (ASTELIN ) 0.1 % nasal spray Place 1 spray into both nostrils 2 (two) times daily. Use in each nostril as directed 07/05/24  Yes Stuart Vernell Norris, PA-C  acetaminophen  (TYLENOL ) 650 MG CR tablet Take 650 mg by mouth daily. Patient taking differently: Take 650 mg by mouth as needed.    [provider]  Cholecalciferol  (VITAMIN D3) 2000 units capsule Take 2,000 Units by mouth 2 (two) times daily.    [provider]  Coenzyme Q10 200 MG capsule Take 200 mg by mouth as needed.    [provider]  diltiazem  (CARDIZEM ) 30 MG tablet TAKE 1 TABLET BY MOUTH EVERY 8 HOURS AS NEEDED 10/06/23   Lavona Agent, MD  dofetilide  (TIKOSYN ) 250 MCG capsule Take 1 capsule (250 mcg total) by mouth 2 (two) times daily. 07/03/24   Aniceto Daphne CROME, NP  escitalopram  (LEXAPRO ) 10 MG tablet Take 10 mg by mouth at bedtime.    [provider]  furosemide  (LASIX ) 20 MG tablet Take 1 tablet (20 mg total) by mouth 2 (two) times daily. 10/16/23   Lavona Agent, MD  metoprolol  succinate (TOPROL -XL) 100 MG 24 hr tablet Take 1 tablet (100 mg total) by mouth 2 (two) times daily. 01/24/24   Fernande Elspeth BROCKS, MD  Multiple Vitamins-Minerals (CENTRUM SILVER  PO) Take 1 tablet by mouth daily.    [provider]  OXYGEN  Inhale 2 L into the lungs at bedtime. Uses with CPAP machine    [provider]  triamcinolone ointment (KENALOG) 0.1 % Apply 1 Application topically in the morning and at bedtime. 06/08/21   [provider]  vitamin B-12 (CYANOCOBALAMIN ) 500 MCG tablet Take 500 mcg by mouth daily.    [provider]  warfarin (COUMADIN ) 5 MG tablet TAKE 1/2 TO 1 (ONE-HALF TO ONE) TABLET BY MOUTH ONCE DAILY AS  DIRECTED  BY  COUMADIN   CLINIC. 02/23/24   Lavona Agent, MD    Family History Family History  Problem Relation Age of Onset   Dementia Mother    Heart failure Father    Hypertension Father    Sleep apnea Brother     Social History Social History   Tobacco Use   Smoking status: Former    Current packs/day: 0.00    Average packs/day: 0.5 packs/day for 20.0 years (10.0 ttl pk-yrs)    Types: Cigarettes    Start date: 06/03/1951    Quit date: 06/03/1971    Years since quitting: 53.1    Passive exposure: Past   Smokeless tobacco: Never   Tobacco comments:    Former smoker 08/16/22  Vaping Use   Vaping status: Never Used  Substance Use Topics   Alcohol use: Yes    Comment: occ   Drug use: No  Allergies   Doxycycline , Amiodarone , Carbapenems, Cephalexin , Cephalosporins, Chlorpheniramine, Ciprofloxacin, Decongestant [oxymetazoline], Diltiazem , Flagyl [metronidazole], Hctz [hydrochlorothiazide], Levaquin [levofloxacin in d5w], Levofloxacin, Lidocaine , Penicillins, Teline [tetracycline], Tetanus toxoid, Z-pak [azithromycin], Sulfonamide derivatives, and Toprol  xl [metoprolol  tartrate]   Review of Systems Review of Systems PER HPI  Physical Exam Triage Vital Signs ED Triage Vitals  Encounter Vitals Group     BP 07/05/24 1011 133/70     Girls Systolic BP Percentile --      Girls Diastolic BP Percentile --      Boys Systolic BP Percentile --      Boys Diastolic BP Percentile --      Pulse Rate 07/05/24 1011 64     Resp 07/05/24 1011 16     Temp --      Temp src --      SpO2 07/05/24 1016 92 %     Weight --      Height --      Head Circumference --      Peak Flow --      Pain Score 07/05/24 1014 5     Pain Loc --      Pain Education --      Exclude from Growth Chart --    No data found.  Updated Vital Signs BP 133/70 (BP Location: Left Arm)   Pulse  64   Resp 16   SpO2 92%   Visual Acuity Right Eye Distance:   Left Eye Distance:   Bilateral Distance:    Right Eye Near:   Left Eye Near:    Bilateral Near:     Physical Exam Vitals and nursing note reviewed.  Constitutional:      Appearance: Normal appearance.  HENT:     Head: Atraumatic.     Ears:     Comments: Right middle ear effusion    Nose: Nose normal.     Mouth/Throat:     Mouth: Mucous membranes are moist.  Eyes:     Extraocular Movements: Extraocular movements intact.     Conjunctiva/sclera: Conjunctivae normal.  Cardiovascular:     Rate and Rhythm: Normal rate and regular rhythm.  Pulmonary:     Effort: Pulmonary effort is normal.     Breath sounds: Normal breath sounds. No stridor.  Musculoskeletal:        General: Normal range of motion.     Cervical back: Normal range of motion and neck supple.  Skin:    General: Skin is warm and dry.  Neurological:     Mental Status: Mental status is at baseline.  Psychiatric:        Mood and Affect: Mood normal.        Thought Content: Thought content normal.        Judgment: Judgment normal.      UC Treatments / Results  Labs (all labs ordered are listed, but only abnormal results are displayed) Labs Reviewed - No data to display  EKG   Radiology [facility] [employer] [facility] [employer] DEVICE CHECK Result Date: 07/03/2024 Normal in-clinic dual chamber pacemaker check. Presenting Rhythm: APVP . Routine testing of thresholds, sensing, and impedance demonstrate stable parameters. Auto capture turned on. PVAB reduced to 70ms by Industry to allow for accurate recording of AMS episodes.  AMS episodes reviewed show AT, lasting less than 30 seconds. Estimated longevity 3.3 years.  Pt enrolled in remote follow-up. bo   Procedures Procedures (including critical care time)  Medications Ordered in UC Medications - No data to display  Initial Impression / Assessment and Plan / UC Course  I have reviewed the triage vital signs and  the nursing notes.  Pertinent labs & imaging results that were available during my care of the patient were reviewed by me and considered in my medical decision making (see chart for details).     Consistent with right eustachian tube dysfunction.  Treat with Astelin , saline sinus rinses, Tylenol  as needed.  He states he is allergic to antihistamines.  Return for worsening or unresolving symptoms.  Final Clinical Impressions(s) / UC Diagnoses   Final diagnoses:  Acute dysfunction of right eustachian tube  Sinus drainage   Discharge Instructions   None    ED Prescriptions     Medication Sig Dispense Auth. Provider   azelastine  (ASTELIN ) 0.1 % nasal spray Place 1 spray into both nostrils 2 (two) times daily. Use in each nostril as directed 30 mL Stuart Vernell Norris, PA-C      PDMP not reviewed this encounter.   Stuart Vernell Norris, [place] 07/05/24 1557

## 2024-07-11 DIAGNOSIS — J8410 Pulmonary fibrosis, unspecified: Secondary | ICD-10-CM | POA: Diagnosis not present

## 2024-07-11 DIAGNOSIS — I5032 Chronic diastolic (congestive) heart failure: Secondary | ICD-10-CM | POA: Diagnosis not present

## 2024-07-11 DIAGNOSIS — Z515 Encounter for palliative care: Secondary | ICD-10-CM | POA: Diagnosis not present

## 2024-07-17 ENCOUNTER — Telehealth (HOSPITAL_COMMUNITY): Payer: Self-pay | Admitting: *Deleted

## 2024-07-17 NOTE — Telephone Encounter (Signed)
 Patients daughter, Lamarr, called in stating her dads heart rates have been intermittently elevated since Monday requiring PRN cardizem  use. She does report he has not felt well since he went to urgent care on 8/22 for ear pain and is now congested had fever over the weekend and no energy. Encouraged follow up regarding continued illness as this is probable driver of afib trigger. Pt reported his heart rates do respond to the PRN cardizem .  They will try to send transmission to see what rate/rhythm trends are and daughter will have pt assessed regarding illness.

## 2024-07-18 NOTE — Telephone Encounter (Signed)
 Transmission received.  Pt having increased episodes of atrial tachycardia with rapid ventricular response.  Presenting rhythm NSR.  Longest AT episode 36 minutes.  Overall AT burden <1%.

## 2024-07-18 NOTE — Telephone Encounter (Signed)
 Patient daughter notified of transmission results. No medication changes per Daril Kicks PA. Recommended follow up with PCP regarding continued illness. Daughter verbalized agreement.

## 2024-07-18 NOTE — Telephone Encounter (Signed)
 Left message to discuss

## 2024-07-19 ENCOUNTER — Encounter (HOSPITAL_BASED_OUTPATIENT_CLINIC_OR_DEPARTMENT_OTHER): Payer: Self-pay

## 2024-07-19 ENCOUNTER — Emergency Department (HOSPITAL_BASED_OUTPATIENT_CLINIC_OR_DEPARTMENT_OTHER)

## 2024-07-19 ENCOUNTER — Other Ambulatory Visit: Payer: Self-pay

## 2024-07-19 ENCOUNTER — Emergency Department (HOSPITAL_BASED_OUTPATIENT_CLINIC_OR_DEPARTMENT_OTHER): Admission: EM | Admit: 2024-07-19 | Discharge: 2024-07-19 | Disposition: A | Discharge status: Home / Self Care

## 2024-07-19 DIAGNOSIS — Z7901 Long term (current) use of anticoagulants: Secondary | ICD-10-CM | POA: Diagnosis not present

## 2024-07-19 DIAGNOSIS — I10 Essential (primary) hypertension: Secondary | ICD-10-CM | POA: Diagnosis not present

## 2024-07-19 DIAGNOSIS — R000 Tachycardia, unspecified: Secondary | ICD-10-CM | POA: Diagnosis not present

## 2024-07-19 DIAGNOSIS — I482 Chronic atrial fibrillation, unspecified: Secondary | ICD-10-CM | POA: Diagnosis not present

## 2024-07-19 DIAGNOSIS — R059 Cough, unspecified: Secondary | ICD-10-CM | POA: Diagnosis not present

## 2024-07-19 DIAGNOSIS — R0602 Shortness of breath: Secondary | ICD-10-CM | POA: Diagnosis not present

## 2024-07-19 DIAGNOSIS — I4891 Unspecified atrial fibrillation: Secondary | ICD-10-CM | POA: Diagnosis not present

## 2024-07-19 DIAGNOSIS — U071 COVID-19: Secondary | ICD-10-CM | POA: Insufficient documentation

## 2024-07-19 DIAGNOSIS — R002 Palpitations: Secondary | ICD-10-CM | POA: Diagnosis not present

## 2024-07-19 DIAGNOSIS — R052 Subacute cough: Secondary | ICD-10-CM

## 2024-07-19 LAB — BASIC METABOLIC PANEL WITH GFR
Anion gap: 10 (ref 5–15)
BUN: 15 mg/dL (ref 8–23)
CO2: 29 mmol/L (ref 22–32)
Calcium: 9.8 mg/dL (ref 8.9–10.3)
Chloride: 98 mmol/L (ref 98–111)
Creatinine, Ser: 1.13 mg/dL (ref 0.61–1.24)
GFR, Estimated: >60 mL/min (ref >60)
Glucose, Bld: 102 mg/dL — ABNORMAL HIGH (ref 70–99)
Potassium: 4.2 mmol/L (ref 3.5–5.1)
Sodium: 137 mmol/L (ref 135–145)

## 2024-07-19 LAB — RESP PANEL BY RT-PCR (RSV, FLU A&B, COVID)  RVPGX2
Influenza A by PCR: NEGATIVE
Influenza B by PCR: NEGATIVE
Resp Syncytial Virus by PCR: NEGATIVE
SARS Coronavirus 2 by RT PCR: POSITIVE — AB

## 2024-07-19 LAB — CBC
HCT: 45.1 % (ref 39.0–52.0)
Hemoglobin: 14.7 g/dL (ref 13.0–17.0)
MCH: 29.1 pg (ref 26.0–34.0)
MCHC: 32.6 g/dL (ref 30.0–36.0)
MCV: 89.3 fL (ref 80.0–100.0)
Platelets: 166 K/uL (ref 150–400)
RBC: 5.05 MIL/uL (ref 4.22–5.81)
RDW: 14.6 % (ref 11.5–15.5)
WBC: 5.6 K/uL (ref 4.0–10.5)
nRBC: 0 % (ref 0.0–0.2)

## 2024-07-19 LAB — TROPONIN T, HIGH SENSITIVITY
Troponin T High Sensitivity: 18 ng/L (ref 0–19)
Troponin T High Sensitivity: 18 ng/L (ref 0–19)

## 2024-07-19 LAB — PROTIME-INR
INR: 1.9 — ABNORMAL HIGH (ref 0.8–1.2)
Prothrombin Time: 22.7 s — ABNORMAL HIGH (ref 11.4–15.2)

## 2024-07-19 LAB — MAGNESIUM: Magnesium: 2.2 mg/dL (ref 1.7–2.4)

## 2024-07-19 NOTE — ED Triage Notes (Signed)
 Pt POV reporting intermittent palpitations and SOB x3 days, hx afib, states it feels like same. Not feeling sx currently, in between PCPs so wanted to be evaluated in ED.

## 2024-07-19 NOTE — ED Provider Notes (Signed)
 [place] EMERGENCY DEPARTMENT AT [facility] Provider Note   CSN: [id number] Arrival date & time: 07/19/24  1707     Patient presents with: Tachycardia   Chad [name] is a 89 y.o. male.   HPI    Presents because of tachycardia.  Episodes of shortness of breath.  Cough.  Patient states he did have a cough as well as a runny nose and congestion over the past couple weeks.  Currently, not experiencing, shortness of breath.  No exertional chest pain.  No recent chest pain.  No hemoptysis.  Patient states he been compliant with his Coumadin .  Patient states that he is currently on Tikosyn  and has not missed any doses.  Otherwise denies all complaints.  His biggest reason for coming to the ED today is to rule out any kind of pneumonia given his ongoing cough has been present for the past couple weeks.  No sick contacts that he is aware of   Previous medical history reviewed : h/o SND s/p PPM, AF, HTN, NSVT, falls (SDH), OSA on CPAP with O 2.  Follows with cardiology.  Status post Abbott PPM.  Sinus bradycardia with first-degree AV block.  Atrial tachycardia.  History of A-fib.  [place] VASc of 5.  Continue Tikosyn . On coumadin .    Prior to Admission medications   Medication Sig Start Date End Date Taking? Authorizing Provider  acetaminophen  (TYLENOL ) 650 MG CR tablet Take 650 mg by mouth daily. Patient taking differently: Take 650 mg by mouth as needed.    [provider]  azelastine  (ASTELIN ) 0.1 % nasal spray Place 1 spray into both nostrils 2 (two) times daily. Use in each nostril as directed 07/05/24   Stuart Vernell Norris, PA-C  Cholecalciferol  (VITAMIN D3) 2000 units capsule Take 2,000 Units by mouth 2 (two) times daily.    [provider]  Coenzyme Q10 200 MG capsule Take 200 mg by mouth as needed.    [provider]  diltiazem  (CARDIZEM ) 30 MG tablet TAKE 1 TABLET BY MOUTH EVERY 8 HOURS AS NEEDED 10/06/23   Lavona Agent, MD  dofetilide  (TIKOSYN )  250 MCG capsule Take 1 capsule (250 mcg total) by mouth 2 (two) times daily. 07/03/24   Aniceto Daphne CROME, NP  escitalopram  (LEXAPRO ) 10 MG tablet Take 10 mg by mouth at bedtime.    [provider]  furosemide  (LASIX ) 20 MG tablet Take 1 tablet (20 mg total) by mouth 2 (two) times daily. 10/16/23   Lavona Agent, MD  metoprolol  succinate (TOPROL -XL) 100 MG 24 hr tablet Take 1 tablet (100 mg total) by mouth 2 (two) times daily. 01/24/24   Fernande Elspeth BROCKS, MD  Multiple Vitamins-Minerals (CENTRUM SILVER  PO) Take 1 tablet by mouth daily.    [provider]  OXYGEN  Inhale 2 L into the lungs at bedtime. Uses with CPAP machine    [provider]  triamcinolone ointment (KENALOG) 0.1 % Apply 1 Application topically in the morning and at bedtime. 06/08/21   [provider]  vitamin B-12 (CYANOCOBALAMIN ) 500 MCG tablet Take 500 mcg by mouth daily.    [provider]  warfarin (COUMADIN ) 5 MG tablet TAKE 1/2 TO 1 (ONE-HALF TO ONE) TABLET BY MOUTH ONCE DAILY AS  DIRECTED  BY  COUMADIN   CLINIC. 02/23/24   Lavona Agent, MD    Allergies: Doxycycline , Amiodarone , Carbapenems, Cephalexin , Cephalosporins, Chlorpheniramine, Ciprofloxacin, Decongestant [oxymetazoline], Diltiazem , Flagyl [metronidazole], Hctz [hydrochlorothiazide], Levaquin [levofloxacin in d5w], Levofloxacin, Lidocaine , Penicillins, Teline [tetracycline], Tetanus toxoid, Z-pak [azithromycin],  Sulfonamide derivatives, and Toprol  xl [metoprolol  tartrate]    Review of Systems  Constitutional:  Negative for chills and fever.  HENT:  Negative for ear pain and sore throat.   Eyes:  Negative for pain and visual disturbance.  Respiratory:  Negative for cough and shortness of breath.   Cardiovascular:  Negative for chest pain and palpitations.  Gastrointestinal:  Negative for abdominal pain and vomiting.  Genitourinary:  Negative for dysuria and hematuria.  Musculoskeletal:  Negative for arthralgias and back pain.   Skin:  Negative for color change and rash.  Neurological:  Negative for seizures and syncope.  All other systems reviewed and are negative.   Updated Vital Signs BP (!) 142/83   Pulse 67   Temp 97.9 F (36.6 C)   Resp 17   SpO2 97%   Physical Exam Vitals and nursing note reviewed.  Constitutional:      General: He is not in acute distress.    Appearance: He is well-developed.  HENT:     Head: Normocephalic and atraumatic.  Eyes:     Conjunctiva/sclera: Conjunctivae normal.  Cardiovascular:     Rate and Rhythm: Normal rate and regular rhythm.     Heart sounds: No murmur heard. Pulmonary:     Effort: Pulmonary effort is normal. No respiratory distress.     Breath sounds: Normal breath sounds.  Abdominal:     Palpations: Abdomen is soft.     Tenderness: There is no abdominal tenderness.  Musculoskeletal:        General: No swelling.     Cervical back: Neck supple.  Skin:    General: Skin is warm and dry.     Capillary Refill: Capillary refill takes less than 2 seconds.  Neurological:     Mental Status: He is alert.  Psychiatric:        Mood and Affect: Mood normal.     (all labs ordered are listed, but only abnormal results are displayed) Labs Reviewed  RESP PANEL BY RT-PCR (RSV, FLU A&B, COVID)  RVPGX2 - Abnormal; Notable for the following components:      Result Value   SARS Coronavirus 2 by RT PCR POSITIVE (*)    All other components within normal limits  BASIC METABOLIC PANEL WITH GFR - Abnormal; Notable for the following components:   Glucose, Bld 102 (*)    All other components within normal limits  PROTIME-INR - Abnormal; Notable for the following components:   Prothrombin Time 22.7 (*)    INR 1.9 (*)    All other components within normal limits  RESP PANEL BY RT-PCR (RSV, FLU A&B, COVID)  RVPGX2  CBC  MAGNESIUM   TROPONIN T, HIGH SENSITIVITY  TROPONIN T, HIGH SENSITIVITY    EKG: EKG Interpretation Date/Time:  Friday July 19 2024 17:35:27  EDT Ventricular Rate:  63 PR Interval:  270 QRS Duration:  102 QT Interval:  460 QTC Calculation: 470 R Axis:   -68  Text Interpretation: AV dual-paced rhythm with prolonged AV conduction Abnormal ECG When compared with ECG of 03-Jul-2024 15:11, Nu acute changes Confirmed by Simon Rea [phone]) on 07/19/2024 8:24:55 PM  Radiology: [facility] Chest Port 1 View Result Date: 07/19/2024 EXAM: 1 VIEW(S) XRAY OF THE CHEST 07/19/2024 08:08:00 PM COMPARISON: Comparison with chest radiograph 07/22/2023 and CTA chest 07/23/2023. CLINICAL HISTORY: Tachycardia, intermittent palpitations and shortness of breath (SOB) x3 days, history of atrial fibrillation (AFIB), states it feels like the same, not feeling symptoms currently. FINDINGS: LUNGS AND PLEURA: Bibasilar atelectasis  or scarring, background of emphysema and chronic interstitial disease. No pleural effusion or pneumothorax. HEART AND MEDIASTINUM: Stable cardiomediastinal silhouette. BONES AND SOFT TISSUES: Left chest wall pacemaker, aortic atherosclerotic calcification. IMPRESSION: 1. No acute process. Chronic interstitial coarsening. Electronically signed by: Norman Gatlin MD 07/19/2024 08:14 PM EDT RP Workstation: [id number]     Procedures   Medications Ordered in the ED - No data to display                                  Medical Decision Making Amount and/or Complexity of Data Reviewed Labs: ordered. Radiology: ordered.     Presents because of tachycardia.  Episodes of shortness of breath.  Cough.  Patient states he did have a cough as well as a runny nose and congestion over the past couple weeks.  Currently, not experiencing, shortness of breath.  No exertional chest pain.  No recent chest pain.  No hemoptysis.  Patient states he been compliant with his Coumadin .  Patient states that he is currently on Tikosyn  and has not missed any doses.  Otherwise denies all complaints.  His biggest reason for coming to the ED today is to rule out any kind  of pneumonia given his ongoing cough has been present for the past couple weeks.  No sick contacts that he is aware of   Previous medical history reviewed : h/o SND s/p PPM, AF, HTN, NSVT, falls (SDH), OSA on CPAP with O 2.  Follows with cardiology.  Status post Abbott PPM.  Sinus bradycardia with first-degree AV block.  Atrial tachycardia.  History of A-fib.  [place] VASc of 5.  Continue Tikosyn . On coumadin .    Upon exam, patient hemodynamically stable.  Initial EKG showed paced rhythm.  When I was in the room, patient was in A-fib rates in the 60s to 70s.  Positive rate control   Remainder cardiac telemetry.  Patient was had episodes of A-fib.  All rate controlled.  No evidence of any kind of tachycardia.  Did try and interpreted the patient's pacemaker but fortunately, cannot interpret [facility] pacemaker here at [facility].  Therefore, did watch patient on cardiac telemetry without count of evidence of significant tachycardia.   Laboratory workup unremarkable.  Potassium 4.2.  Mag 2.2.  No large electrolyte derangements.  Cardiac workup unremarkable.  Troponin x 2 unremarkable.  No chest pain.  No exertional chest pain.  Initial troponin was ordered by triage.  Repeated troponin given initial order.  My concerns for ACS pathology is very low.   No history of DVT or PE.  Also on warfarin.  No concern for PE.  No obvious superimposed infection.  No concerns significant of bacterial pneumonia this point time.   Patient did test positive for COVID.  Patient has been symptomatic for at least 4 to 5 days.  No indication to start Paxlovid given chronicity of symptoms.  Risk would outweigh benefits.  Patient remained on room air as well as 2 L which is his baseline.  No respiratory distress.  Cleared for discharge home.         Final diagnoses:  Subacute cough  Tachycardia  COVID-19    ED Discharge Orders     None          Simon Lavonia SAILOR, MD 07/19/24 2345

## 2024-07-19 NOTE — Discharge Instructions (Addendum)
 Your INR is 1.9.  Make sure you take your warfarin as prescribed.  Please follow-up with your coagulation clinic to make sure your INR is therapeutic in nature.   You do have COVID.  This is likely causing your cough.  This could also make you have worsening episodes of A-fib.  You remained mostly in sinus rhythm here in the ED.  You did have a few episodes of atrial fibrillation.  There was no fast rates.  Please follow-up with cardiologist.  They will likely want to interpret your pacemaker.  If you have any, chest pain or shortness of breath and come back to the ED

## 2024-07-29 ENCOUNTER — Ambulatory Visit: Attending: Cardiology | Admitting: *Deleted

## 2024-07-29 DIAGNOSIS — Z5181 Encounter for therapeutic drug level monitoring: Secondary | ICD-10-CM | POA: Diagnosis not present

## 2024-07-29 DIAGNOSIS — I4819 Other persistent atrial fibrillation: Secondary | ICD-10-CM | POA: Diagnosis not present

## 2024-07-29 LAB — POCT INR: INR: 3.1 — AB (ref 2.0–3.0)

## 2024-07-29 NOTE — Progress Notes (Signed)
 INR 3.1  Please see anticoagulation encounter

## 2024-07-29 NOTE — Patient Instructions (Signed)
Hold warfarin tonight then resume 1/2 tablet daily except 1 tablet on Tuesdays Recheck in 4 weeks

## 2024-07-30 ENCOUNTER — Encounter

## 2024-07-30 ENCOUNTER — Ambulatory Visit: Admitting: Podiatry

## 2024-07-30 ENCOUNTER — Encounter: Payer: Self-pay | Admitting: Podiatry

## 2024-07-30 DIAGNOSIS — M79674 Pain in right toe(s): Secondary | ICD-10-CM | POA: Diagnosis not present

## 2024-07-30 DIAGNOSIS — B351 Tinea unguium: Secondary | ICD-10-CM

## 2024-07-30 DIAGNOSIS — M79675 Pain in left toe(s): Secondary | ICD-10-CM | POA: Diagnosis not present

## 2024-07-30 DIAGNOSIS — Z7901 Long term (current) use of anticoagulants: Secondary | ICD-10-CM

## 2024-07-30 NOTE — Progress Notes (Signed)
 Subjective: Chief Complaint  Patient presents with   rfc    Rm11 routine foot care     89 y.o. returns the office today for painful, elongated, thickened toenails which he cannot trim himself.  No swelling redness or injury to the toenail sites.  Denies any ulcerations or new concerns today.  He is on Coumadin   PCP: Marvine Rush, MD  Objective: AAO 3, NAD DP/PT pulses palpable, CRT less than 3 seconds Nails hypertrophic, dystrophic, elongated, brittle, discolored 10.  Subungual debris present.  Incurvation present the hallux toenail left side worse than right.  No edema, erythema or signs of infection of the toenail sites. No open lesions or pre-ulcerative lesions are identified. No pain with calf compression, swelling, warmth, erythema.  Assessment: Patient presents with symptomatic onychomycosis; chronic anticoagulation  Plan: -Treatment options including alternatives, risks, complications were discussed -Nails sharply debrided 10 without complication/bleeding.   -Discussed daily foot inspection. If there are any changes, to call the office immediately.    Return in about 3 months (around 10/29/2024) for nail trim .   Donnice Fees, DPM

## 2024-08-04 DIAGNOSIS — I4891 Unspecified atrial fibrillation: Secondary | ICD-10-CM | POA: Diagnosis not present

## 2024-08-19 ENCOUNTER — Other Ambulatory Visit: Payer: Self-pay

## 2024-08-19 ENCOUNTER — Ambulatory Visit (INDEPENDENT_AMBULATORY_CARE_PROVIDER_SITE_OTHER): Payer: Medicare Other

## 2024-08-19 DIAGNOSIS — I4819 Other persistent atrial fibrillation: Secondary | ICD-10-CM | POA: Diagnosis not present

## 2024-08-21 ENCOUNTER — Telehealth: Payer: Self-pay

## 2024-08-21 LAB — CUP PACEART REMOTE DEVICE CHECK
Battery Remaining Longevity: 28 mo
Battery Remaining Percentage: 30 %
Battery Voltage: 2.9 V
Brady Statistic AP VP Percent: 28 %
Brady Statistic AP VS Percent: 56 %
Brady Statistic AS VP Percent: 3.8 %
Brady Statistic AS VS Percent: 10 %
Brady Statistic RA Percent Paced: 80 %
Brady Statistic RV Percent Paced: 32 %
Date Time Interrogation Session: 20251006020013
Implantable Lead Connection Status: 753985
Implantable Lead Connection Status: 753985
Implantable Lead Implant Date: 20180808
Implantable Lead Implant Date: 20180808
Implantable Lead Location: 753859
Implantable Lead Location: 753860
Implantable Lead Model: 5076
Implantable Lead Model: 5076
Implantable Pulse Generator Implant Date: 20180808
Lead Channel Impedance Value: 430 Ohm
Lead Channel Impedance Value: 450 Ohm
Lead Channel Pacing Threshold Amplitude: 0.5 V
Lead Channel Pacing Threshold Amplitude: 0.75 V
Lead Channel Pacing Threshold Pulse Width: 0.5 ms
Lead Channel Pacing Threshold Pulse Width: 0.5 ms
Lead Channel Sensing Intrinsic Amplitude: 12 mV
Lead Channel Sensing Intrinsic Amplitude: 2.7 mV
Lead Channel Setting Pacing Amplitude: 1 V
Lead Channel Setting Pacing Amplitude: 5 V
Lead Channel Setting Pacing Pulse Width: 0.5 ms
Lead Channel Setting Sensing Sensitivity: 2 mV
Pulse Gen Model: 2272
Pulse Gen Serial Number: 8932564

## 2024-08-21 NOTE — Telephone Encounter (Signed)
 Alert received:  Scheduled transmission.  Alert d/t presenting rhythm with V-rates 120-140 bpm.  Pt atrial lead in high output.  Presenting rhythm appears to be SVT.    Outreach made to Pt's daughter.  Per daughter Pt is just starting to feel a little better.  She states he was very sick with covid.  He was unable to take any medications d/t being on tikosyn .  She said it's been a long recovery process.  Daughter is going to stay with Pt tomorrow.  She will send a transmission upon arrival to recheck presenting rhythm and histograms.  She will call when she sends the transmission so we are aware to review.

## 2024-08-21 NOTE — Progress Notes (Signed)
 Remote PPM Transmission

## 2024-08-23 ENCOUNTER — Ambulatory Visit: Payer: Self-pay | Admitting: Cardiology

## 2024-08-26 ENCOUNTER — Ambulatory Visit: Attending: Cardiology | Admitting: *Deleted

## 2024-08-26 DIAGNOSIS — Z5181 Encounter for therapeutic drug level monitoring: Secondary | ICD-10-CM

## 2024-08-26 DIAGNOSIS — I4819 Other persistent atrial fibrillation: Secondary | ICD-10-CM | POA: Diagnosis not present

## 2024-08-26 LAB — POCT INR: INR: 2.9 (ref 2.0–3.0)

## 2024-08-26 NOTE — Progress Notes (Signed)
 INR 2.9; Please see anticoagulation encounter

## 2024-08-26 NOTE — Progress Notes (Signed)
 Remote PPM Transmission

## 2024-08-26 NOTE — Patient Instructions (Signed)
Continue warfarin 1/2 tablet daily except 1 tablet on Tuesdays Recheck in 6 weeks

## 2024-08-27 DIAGNOSIS — I5032 Chronic diastolic (congestive) heart failure: Secondary | ICD-10-CM | POA: Diagnosis not present

## 2024-08-27 DIAGNOSIS — Z515 Encounter for palliative care: Secondary | ICD-10-CM | POA: Diagnosis not present

## 2024-08-27 DIAGNOSIS — J8410 Pulmonary fibrosis, unspecified: Secondary | ICD-10-CM | POA: Diagnosis not present

## 2024-08-27 NOTE — Telephone Encounter (Signed)
 Transmission received.    Atrial output remains at high output.  Left message for daughter.  Pt has follow up with Dr. Inocencio in 6 weeks.  Or if she is able to bring Pt sooner could schedule device clinic for atrial lead reprogramming.  Will follow up.

## 2024-08-27 NOTE — Telephone Encounter (Signed)
 Voicemail received from Pt's daughter advising remote transmission attempted last night 08/26/2024.  Per review of Merlin, no transmission received.  Daughter states they are going to an appointment right now, but they will try to send a transmission later today when they get home.  Per daughter Pt is doing well.  Will continue to monitor.

## 2024-08-27 NOTE — Telephone Encounter (Signed)
 Call back received from daughter.  Explained that atrial lead is currently using too much energy.  Offered to see Pt in device clinic sooner than his scheduled follow up.  Daughter understands education.  She states at this point they will keep scheduled follow up.  She will discuss further with Pt and call device clinic if Pt feels like he can come sooner.  Pt has recently been ill and is recovering.    Will continue to monitor.

## 2024-10-08 DIAGNOSIS — I5032 Chronic diastolic (congestive) heart failure: Secondary | ICD-10-CM | POA: Diagnosis not present

## 2024-10-08 DIAGNOSIS — Z515 Encounter for palliative care: Secondary | ICD-10-CM | POA: Diagnosis not present

## 2024-10-09 ENCOUNTER — Ambulatory Visit: Attending: Internal Medicine | Admitting: Cardiology

## 2024-10-09 ENCOUNTER — Ambulatory Visit: Admitting: Pharmacist

## 2024-10-09 ENCOUNTER — Encounter: Payer: Self-pay | Admitting: Cardiology

## 2024-10-09 ENCOUNTER — Telehealth: Payer: Self-pay | Admitting: Cardiology

## 2024-10-09 ENCOUNTER — Ambulatory Visit

## 2024-10-09 VITALS — BP 135/83 | HR 72 | Ht 69.0 in | Wt 224.0 lb

## 2024-10-09 DIAGNOSIS — Z5181 Encounter for therapeutic drug level monitoring: Secondary | ICD-10-CM | POA: Diagnosis not present

## 2024-10-09 DIAGNOSIS — I10 Essential (primary) hypertension: Secondary | ICD-10-CM

## 2024-10-09 DIAGNOSIS — I4819 Other persistent atrial fibrillation: Secondary | ICD-10-CM

## 2024-10-09 DIAGNOSIS — I495 Sick sinus syndrome: Secondary | ICD-10-CM | POA: Diagnosis not present

## 2024-10-09 DIAGNOSIS — D6869 Other thrombophilia: Secondary | ICD-10-CM | POA: Diagnosis not present

## 2024-10-09 LAB — CUP PACEART INCLINIC DEVICE CHECK
Battery Remaining Longevity: 31 mo
Battery Voltage: 2.9 V
Brady Statistic RA Percent Paced: 79 %
Brady Statistic RV Percent Paced: 35 %
Date Time Interrogation Session: 20251126172844
Implantable Lead Connection Status: 753985
Implantable Lead Connection Status: 753985
Implantable Lead Implant Date: 20180808
Implantable Lead Implant Date: 20180808
Implantable Lead Location: 753859
Implantable Lead Location: 753860
Implantable Lead Model: 5076
Implantable Lead Model: 5076
Implantable Pulse Generator Implant Date: 20180808
Lead Channel Impedance Value: 425 Ohm
Lead Channel Impedance Value: 450 Ohm
Lead Channel Pacing Threshold Amplitude: 0.625 V
Lead Channel Pacing Threshold Amplitude: 0.875 V
Lead Channel Pacing Threshold Pulse Width: 0.5 ms
Lead Channel Pacing Threshold Pulse Width: 0.5 ms
Lead Channel Sensing Intrinsic Amplitude: 12 mV
Lead Channel Sensing Intrinsic Amplitude: 2.1 mV
Lead Channel Setting Pacing Amplitude: 1.125
Lead Channel Setting Pacing Amplitude: 1.625
Lead Channel Setting Pacing Pulse Width: 0.5 ms
Lead Channel Setting Sensing Sensitivity: 2 mV
Pulse Gen Model: 2272
Pulse Gen Serial Number: 8932564

## 2024-10-09 LAB — POCT INR: INR: 2.3 (ref 2.0–3.0)

## 2024-10-09 NOTE — Telephone Encounter (Signed)
 Pts daughter calling ot reschedule INR check. She would like to know if Olam is available at 12:30 on 12/1. Please advise.

## 2024-10-09 NOTE — Progress Notes (Signed)
 Description   INR 2.3 Continue warfarin 1/2 tablet daily except 1 tablet on Thursdays Recheck in 6 weeks

## 2024-10-09 NOTE — Patient Instructions (Signed)
 Description   INR 2.3 Continue warfarin 1/2 tablet daily except 1 tablet on Thursdays Recheck in 6 weeks

## 2024-10-09 NOTE — Progress Notes (Signed)
  Electrophysiology Office Note:   Date:  10/09/2024  ID:  Chad [name], DOB Mar 13, 1929, MRN [id number]  Primary Cardiologist: Lynwood Schilling, MD Primary Heart Failure: None Electrophysiologist: Shanyah Gattuso Gladis Norton, MD      History of Present Illness:   Chad [name] is a 89 y.o. male with h/o sinus node dysfunction, atrial fibrillation, hypertension, nonsustained VT, sleep apnea seen today for routine electrophysiology followup.   Since last being seen in our clinic the patient reports doing overall well.  He has noted minimal episodes of atrial fibrillation.  He has been able to do his daily activities without much restriction.  He is happy with his control.  he denies chest pain, palpitations, dyspnea, PND, orthopnea, nausea, vomiting, dizziness, syncope, edema, weight gain, or early satiety.   Review of systems complete and found to be negative unless listed in HPI.      EP Information / Studies Reviewed:    EKG is not ordered today. EKG from 07/23/2024 reviewed which showed AV paced      PPM Interrogation-  reviewed in detail today,  See PACEART report.  Device History: Abbott Dual Chamber PPM implanted 06/21/2017 for Sinus Node Dysfunction  Risk Assessment/Calculations:    CHA2DS2-VASc Score =     This indicates a  % annual risk of stroke. The patient's score is based upon:              Physical Exam:   VS:  BP 135/83 (BP Location: Left Arm, Patient Position: Sitting, Cuff Size: Large)   Pulse 72   Ht 5' 9 (1.753 m)   Wt 224 lb (101.6 kg)   SpO2 (!) 88%   BMI 33.08 kg/m    Wt Readings from Last 3 Encounters:  10/09/24 224 lb (101.6 kg)  07/03/24 226 lb (102.5 kg)  07/01/24 224 lb 9.6 oz (101.9 kg)     GEN: Well nourished, well developed in no acute distress NECK: No JVD; No carotid bruits CARDIAC: Regular rate and rhythm, no murmurs, rubs, gallops RESPIRATORY:  Clear to auscultation without rales, wheezing or rhonchi  ABDOMEN: Soft, non-tender,  non-distended EXTREMITIES:  No edema; No deformity   ASSESSMENT AND PLAN:    SND s/p Abbott PPM  Normal PPM function See Pace Art report PVARP adjusted for PMT  2.  Atrial fibrillation: Low burden on device interrogation.  On dofetilide .  Continue with current management.  3.  Secondary hypercoagulable state: Continue warfarin  4.  Hypertension: Well-controlled  6.  Nonsustained VT: Low burden on device interrogation  Disposition:   Follow up with EP Team in 6 months  Signed, Callia Swim Gladis Norton, MD

## 2024-10-09 NOTE — Telephone Encounter (Signed)
 Spoke with daughter Lamarr.  INR appt rescheduled for 10/14/24 at 10am.

## 2024-10-14 ENCOUNTER — Ambulatory Visit

## 2024-10-14 DIAGNOSIS — L0889 Other specified local infections of the skin and subcutaneous tissue: Secondary | ICD-10-CM | POA: Diagnosis not present

## 2024-10-14 DIAGNOSIS — L720 Epidermal cyst: Secondary | ICD-10-CM | POA: Diagnosis not present

## 2024-10-14 DIAGNOSIS — L570 Actinic keratosis: Secondary | ICD-10-CM | POA: Diagnosis not present

## 2024-10-14 DIAGNOSIS — L2089 Other atopic dermatitis: Secondary | ICD-10-CM | POA: Diagnosis not present

## 2024-10-17 ENCOUNTER — Telehealth: Payer: Self-pay | Admitting: *Deleted

## 2024-10-17 ENCOUNTER — Ambulatory Visit: Payer: Self-pay | Admitting: Cardiology

## 2024-10-17 NOTE — Telephone Encounter (Signed)
 Pt 's daughter called and stated that pt saw the doctor today and was started on a abx cream to put inside his nose and was started on doxy 100mg  bid x 2 weeks. Informed daughter that doxy can have an affect on the INR. Daughter stated that pt would start the abx tomorrow. Scheduled pt to come in on Monday 12/8 to have INR checked. Instructed for pt to try to get an extra serving of  [facility] in over the weekend. Daughter verbalized understanding.

## 2024-10-21 ENCOUNTER — Other Ambulatory Visit: Payer: Self-pay | Admitting: Cardiology

## 2024-10-21 ENCOUNTER — Ambulatory Visit: Attending: Cardiology

## 2024-10-21 DIAGNOSIS — Z5181 Encounter for therapeutic drug level monitoring: Secondary | ICD-10-CM | POA: Diagnosis not present

## 2024-10-21 DIAGNOSIS — I4819 Other persistent atrial fibrillation: Secondary | ICD-10-CM | POA: Diagnosis not present

## 2024-10-21 LAB — POCT INR: INR: 2 (ref 2.0–3.0)

## 2024-10-21 NOTE — Patient Instructions (Signed)
 On Doxycycline  100mg  twice daily x 2 weeks. Continue warfarin 1/2 tablet daily except 1 tablet on Thursdays Recheck in 2 weeks

## 2024-10-21 NOTE — Telephone Encounter (Signed)
 Refill request for warfarin:  Last INR was 2.0 on 10/21/24 Next INR due 11/04/24 LOV was 10/09/24  Refill approved.

## 2024-10-21 NOTE — Progress Notes (Signed)
 INR 2.0 Please see anticoagulation encounter.

## 2024-10-31 ENCOUNTER — Ambulatory Visit: Admitting: Podiatry

## 2024-10-31 DIAGNOSIS — Z7901 Long term (current) use of anticoagulants: Secondary | ICD-10-CM

## 2024-10-31 DIAGNOSIS — M79674 Pain in right toe(s): Secondary | ICD-10-CM

## 2024-10-31 DIAGNOSIS — M79675 Pain in left toe(s): Secondary | ICD-10-CM | POA: Diagnosis not present

## 2024-10-31 DIAGNOSIS — B351 Tinea unguium: Secondary | ICD-10-CM | POA: Diagnosis not present

## 2024-10-31 NOTE — Progress Notes (Signed)
 Subjective: No chief complaint on file.   89 y.o. returns the office today for painful, elongated, thickened toenails which he cannot trim himself.  No open lesions or any changes.  He is on Coumadin   PCP: Marvine Rush, MD  Objective: AAO 3, NAD DP/PT pulses palpable, CRT less than 3 seconds Nails hypertrophic, dystrophic, elongated, brittle, discolored 10.  Subungual debris present.  Incurvation present the hallux toenail left side worse than right.  No edema, erythema or signs of infection of the toenail sites. No open lesions or pre-ulcerative lesions are identified. No pain with calf compression, swelling, warmth, erythema.  Assessment: Patient presents with symptomatic onychomycosis; chronic anticoagulation  Plan: -Treatment options including alternatives, risks, complications were discussed -Nails sharply debrided 10 without complication/bleeding.   -Discussed daily foot inspection. If there are any changes, to call the office immediately.    Return in about 3 months (around 01/29/2025).   Donnice Fees, DPM

## 2024-11-04 ENCOUNTER — Ambulatory Visit: Attending: Cardiology | Admitting: *Deleted

## 2024-11-04 DIAGNOSIS — I4819 Other persistent atrial fibrillation: Secondary | ICD-10-CM | POA: Diagnosis not present

## 2024-11-04 DIAGNOSIS — Z5181 Encounter for therapeutic drug level monitoring: Secondary | ICD-10-CM

## 2024-11-04 LAB — POCT INR: INR: 2.8 (ref 2.0–3.0)

## 2024-11-04 NOTE — Patient Instructions (Signed)
 Finishes Doxycycline  today Continue warfarin 1/2 tablet daily except 1 tablet on Thursdays Recheck in 4 weeks

## 2024-11-04 NOTE — Progress Notes (Signed)
 INR-2.8 Please see anticoagulation encounter

## 2024-11-13 ENCOUNTER — Ambulatory Visit: Admitting: Cardiology

## 2024-11-13 ENCOUNTER — Encounter: Admitting: Cardiology

## 2024-11-18 ENCOUNTER — Ambulatory Visit: Payer: Medicare Other

## 2024-11-18 ENCOUNTER — Ambulatory Visit: Payer: Self-pay | Admitting: Cardiology

## 2024-11-18 DIAGNOSIS — I4819 Other persistent atrial fibrillation: Secondary | ICD-10-CM | POA: Diagnosis not present

## 2024-11-18 LAB — CUP PACEART REMOTE DEVICE CHECK
Battery Remaining Longevity: 29 mo
Battery Remaining Percentage: 26 %
Battery Voltage: 2.92 V
Brady Statistic AP VP Percent: 25 %
Brady Statistic AP VS Percent: 56 %
Brady Statistic AS VP Percent: 6.4 %
Brady Statistic AS VS Percent: 11 %
Brady Statistic RA Percent Paced: 77 %
Brady Statistic RV Percent Paced: 31 %
Date Time Interrogation Session: 20260104052413
Implantable Lead Connection Status: 753985
Implantable Lead Connection Status: 753985
Implantable Lead Implant Date: 20180808
Implantable Lead Implant Date: 20180808
Implantable Lead Location: 753859
Implantable Lead Location: 753860
Implantable Lead Model: 5076
Implantable Lead Model: 5076
Implantable Pulse Generator Implant Date: 20180808
Lead Channel Impedance Value: 400 Ohm
Lead Channel Impedance Value: 430 Ohm
Lead Channel Pacing Threshold Amplitude: 0.5 V
Lead Channel Pacing Threshold Amplitude: 0.625 V
Lead Channel Pacing Threshold Pulse Width: 0.5 ms
Lead Channel Pacing Threshold Pulse Width: 0.5 ms
Lead Channel Sensing Intrinsic Amplitude: 12 mV
Lead Channel Sensing Intrinsic Amplitude: 2.1 mV
Lead Channel Setting Pacing Amplitude: 0.875
Lead Channel Setting Pacing Amplitude: 1.5 V
Lead Channel Setting Pacing Pulse Width: 0.5 ms
Lead Channel Setting Sensing Sensitivity: 2 mV
Pulse Gen Model: 2272
Pulse Gen Serial Number: 8932564

## 2024-11-21 NOTE — Progress Notes (Signed)
 Remote PPM Transmission

## 2024-11-27 ENCOUNTER — Ambulatory Visit: Admitting: Emergency Medicine

## 2024-11-27 ENCOUNTER — Encounter: Payer: Self-pay | Admitting: Emergency Medicine

## 2024-11-27 ENCOUNTER — Ambulatory Visit: Attending: Cardiology | Admitting: *Deleted

## 2024-11-27 VITALS — BP 132/82 | HR 61 | Temp 97.4°F | Ht 69.0 in | Wt 226.0 lb

## 2024-11-27 DIAGNOSIS — J849 Interstitial pulmonary disease, unspecified: Secondary | ICD-10-CM

## 2024-11-27 DIAGNOSIS — Z87891 Personal history of nicotine dependence: Secondary | ICD-10-CM | POA: Diagnosis not present

## 2024-11-27 DIAGNOSIS — G4733 Obstructive sleep apnea (adult) (pediatric): Secondary | ICD-10-CM

## 2024-11-27 DIAGNOSIS — Z5181 Encounter for therapeutic drug level monitoring: Secondary | ICD-10-CM

## 2024-11-27 DIAGNOSIS — I4819 Other persistent atrial fibrillation: Secondary | ICD-10-CM

## 2024-11-27 DIAGNOSIS — J9611 Chronic respiratory failure with hypoxia: Secondary | ICD-10-CM | POA: Diagnosis not present

## 2024-11-27 LAB — POCT INR: INR: 1.8 — AB (ref 2.0–3.0)

## 2024-11-27 NOTE — Assessment & Plan Note (Signed)
 With some associated restriction and chronic hypoxemia.  He has increased work of breathing on a full stomach and there could be a component of hyperinflation that contributes.  I have considered starting him on BD therapy but we have voiced deferred because of the potential impact on his rate control and atrial fibrillation.  We will continue to hold off.  Follow him clinically, follow chest x-ray intermittently.  I did recommend that he try using smaller more frequent meals

## 2024-11-27 NOTE — Progress Notes (Signed)
 "  Subjective:    Patient ID: Chad [name], male    DOB: 03-17-1929, 89 y.o.   MRN: [id number]  HPI   ROV 11/27/2024 --89 year old man with a history of former tobacco use, diastolic CHF in the setting of hypertension and atrial fibrillation, OSA on CPAP, bladder cancer, some base predominant ILD and a UIP pattern on his chest imaging.  He has never been on antifibrotic therapy, considered but decided to defer based on his age.  He has chronic hypoxemic respiratory failure, has a POC and prescribed 2 L/min. He states that he has been using reliably. He gets SOB with minimal exertion. He is reliable with his CPAP every night, feels good benefit. He is experiencing dyspnea after eating. He is taking lasix  20mg  every day (his notes say that his dose is bid).     Review of Systems As per Hpi  Past Medical History:  Diagnosis Date   Bladder cancer (HCC) 2010   CHF (congestive heart failure) (HCC)    sept 2024   Chronic bronchitis (HCC)    Hypertension    Hypoglycemia    On home oxygen  therapy    2L w/CPAP at night (06/21/2017)   OSA on CPAP    w/2L O2 at night (06/21/2017)   Persistent atrial fibrillation (HCC)        Pneumonia    when he was young; again in 2016 (06/21/2017)   Presence of permanent cardiac pacemaker    Sensory ataxia      Family History  Problem Relation Age of Onset   Dementia Mother    Heart failure Father    Hypertension Father    Sleep apnea Brother      Social History   Socioeconomic History   Marital status: Widowed    Spouse name: [name]   Number of children: 2   Years of education: 16   Highest education level: Not on file  Occupational History    Employer: RETIRED  Tobacco Use   Smoking status: Former    Current packs/day: 0.00    Average packs/day: 0.5 packs/day for 20.0 years (10.0 ttl pk-yrs)    Types: Cigarettes    Start date: 06/03/1951    Quit date: 06/03/1971    Years since quitting: 53.5    Passive exposure: Past   Smokeless  tobacco: Never   Tobacco comments:    Former smoker 08/16/22  Vaping Use   Vaping status: Never Used  Substance and Sexual Activity   Alcohol use: Yes    Comment: occ   Drug use: No   Sexual activity: Not on file  Other Topics Concern   Not on file  Social History Narrative   Patient is married Forensic Psychologist) and lives at home with his wife.   Patient has two children.   Patient is retired.   Patient has a college education.   Patient is right-handed.   Patient does not drink any caffeine.   Social Drivers of Health   Tobacco Use: Medium Risk (11/27/2024)   Patient History    Smoking Tobacco Use: Former    Smokeless Tobacco Use: Never    Passive Exposure: Past  Programmer, Applications: Not on [employer] Insecurity: Not on file  Transportation Needs: Not on file  Physical Activity: Not on file  Stress: Not on file  Social Connections: Not on file  Intimate Partner Violence: Not on file  Depression (EYV7-0): Not on file  Alcohol Screen: Not on file  Housing: Not  on file  Utilities: Not on file  Health Literacy: Not on file    Dust exposure, worked at 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 [place]  Allergies  Allergen Reactions   Doxycycline  Hives   Amiodarone      Caused neuropathy   Carbapenems    Cephalexin  Other (See Comments)   Cephalosporins Other (See Comments)   Chlorpheniramine Other (See Comments)    Swells prostate    Ciprofloxacin     Pt can not take with amiodarone     Decongestant [Oxymetazoline]     All decongestant - makes prostate swell   Diltiazem  Swelling    Left leg swelling   Flagyl [Metronidazole] Nausea Only    Stomach ache   Hctz [Hydrochlorothiazide]    Levaquin [Levofloxacin In D5w] Other (See Comments)    unknown   Levofloxacin Nausea Only   Lidocaine      Pt unsure of   Penicillins Swelling   Teline [Tetracycline] Swelling    Hospitalization for 2 weeks,  prostate swelling   Tetanus Toxoid Swelling    Swells arms, Please pre-medicate with benadryl.   Z-Pak [Azithromycin] Other (See Comments)    Pt can not take with Amiodarone     Sulfonamide Derivatives Rash   Toprol  Xl [Metoprolol  Tartrate] Itching and Rash    Pt states he tolerates.     Outpatient Medications Prior to Visit  Medication Sig Dispense Refill   acetaminophen  (TYLENOL ) 650 MG CR tablet Take 650 mg by mouth daily.     azelastine  (ASTELIN ) 0.1 % nasal spray Place 1 spray into both nostrils 2 (two) times daily. Use in each nostril as directed 30 mL 0   Cholecalciferol  (VITAMIN D3) 2000 units capsule Take 2,000 Units by mouth 2 (two) times daily.     Coenzyme Q10 200 MG capsule Take 200 mg by mouth as needed.     diltiazem  (CARDIZEM ) 30 MG tablet TAKE 1 TABLET BY MOUTH EVERY 8 HOURS AS NEEDED 90 tablet 3   dofetilide  (TIKOSYN ) 250 MCG capsule Take 1 capsule (250 mcg total) by mouth 2 (two) times daily. 180 capsule 3   escitalopram  (LEXAPRO ) 10 MG tablet Take 10 mg by mouth at bedtime.     furosemide  (LASIX ) 20 MG tablet Take 1 tablet (20 mg total) by mouth 2 (two) times daily. 180 tablet 3   metoprolol  succinate (TOPROL -XL) 100 MG 24 hr tablet Take 1 tablet (100 mg total) by mouth 2 (two) times daily. 180 tablet 3   Multiple Vitamins-Minerals (CENTRUM SILVER  PO) Take 1 tablet by mouth daily.     OXYGEN  Inhale 2 L into the lungs at bedtime. Uses with CPAP machine     triamcinolone ointment (KENALOG) 0.1 % Apply 1 Application topically in the morning and at bedtime.     vitamin B-12 (CYANOCOBALAMIN ) 500 MCG tablet Take 500 mcg by mouth daily.     warfarin (COUMADIN ) 5 MG tablet TAKE 1/2 TO 1 (ONE-HALF TO ONE) TABLET BY MOUTH ONCE DAILY AS  DIRECTED  BY  COUMADIN   CLINIC. 100 tablet 1   No facility-administered medications prior to visit.         Objective:   Physical Exam Vitals:   11/27/24 1424  BP: 132/82  Pulse: 61  Temp: (!) 97.4 F (36.3 C)  TempSrc: Oral  SpO2:  (!) 89%  Weight: 226 lb (102.5 kg)  Height: 5' 9 (1.753 m)     Gen: Pleasant, elderly  man, well-nourished, in no distress,  normal affect  ENT: No lesions,  mouth clear,  oropharynx clear, no postnasal drip  Neck: No JVD, no stridor  Lungs: No use of accessory muscles, few bibasilar inspiratory crackles, bilateral inspiratory squeaks.  No wheezing  Cardiovascular: RRR, heart sounds normal, no murmur or gallops, no peripheral edema  Musculoskeletal: No deformities, no cyanosis or clubbing  Neuro: alert, awake, non focal  Skin: Warm, no lesions or rash     Assessment & Plan:  Interstitial lung disease (HCC) With some associated restriction and chronic hypoxemia.  He has increased work of breathing on a full stomach and there could be a component of hyperinflation that contributes.  I have considered starting him on BD therapy but we have voiced deferred because of the potential impact on his rate control and atrial fibrillation.  We will continue to hold off.  Follow him clinically, follow chest x-ray intermittently.  I did recommend that he try using smaller more frequent meals  Chronic respiratory failure with hypoxia (HCC) Multifactorial, principally related to ILD and his underlying cardiac disease which appears to be stable at this time although he does have lower extremity edema.  Continue his oxygen  at 2 L/min  OSA on CPAP Confirmed good compliance and good clinical benefit from his CPAP.  Plan to continue same with oxygen  bled in.  I personally spent a total of 27 minutes in the care of the patient today including preparing to see the patient, getting/reviewing separately obtained history, performing a medically appropriate exam/evaluation, counseling and educating, documenting clinical information in the EHR, independently interpreting results, and communicating results.    Lamar Chris, MD, PhD 11/27/2024, 2:45 PM [facility] [phone] or if  no answer before 7:00PM call [phone] For any issues after 7:00PM please call eLink [phone]   "

## 2024-11-27 NOTE — Assessment & Plan Note (Signed)
 Multifactorial, principally related to ILD and his underlying cardiac disease which appears to be stable at this time although he does have lower extremity edema.  Continue his oxygen  at 2 L/min

## 2024-11-27 NOTE — Progress Notes (Signed)
 INR 1.8. Please see anticoagulation encounter

## 2024-11-27 NOTE — Assessment & Plan Note (Signed)
 Confirmed good compliance and good clinical benefit from his CPAP.  Plan to continue same with oxygen  bled in.

## 2024-11-27 NOTE — Patient Instructions (Signed)
 Take warfarin 1 tablet tonight then resume 1/2 tablet daily except 1 tablet on Thursdays Recheck in 4 weeks

## 2024-11-27 NOTE — Patient Instructions (Signed)
 Please continue to use your oxygen  at 2 L/min at all times. Please continue to use your CPAP with oxygen  bled in at night while sleeping. Continue your Lasix  and other cardiac medications as directed by Dr. Lavona and Dr. Inocencio. We will hold off on starting any inhaled medication at this time. You might benefit from trying small more frequent meals to avoid the impact of a full stomach on your diaphragm movement, lung function and breathing. Follow in our office in 6 months or sooner if you have any problems

## 2024-12-02 ENCOUNTER — Ambulatory Visit

## 2024-12-05 NOTE — Telephone Encounter (Signed)
 Transmission reviewed.  Patient in Aflutter 10 hours on 11/17/24, only short bursts recorded since. Presenting AS/VP rhythm with some PAC's noted, rate approx 60-70bpm.  Atrial burden is low:  1.1%  Can consider adjusting PVARP for earlier mode switching which may improve our atrial diagnostic data. Suspect may be having longer periods of atrial events than what the device is currently reporting.   Test for v-a conduction first, if does not V-A conduct, bring in PVARP at device check.   He sees general cardiology on 2/6 -  will coordinate a device clinic check with adjustments at that time.  Patient's daughter aware, he is not continuing in Aflutter.  Patient is doing well without any concerning symptoms.  Will continue to monitor for AF/Flutter and plan AF clinic follow up if needed in future.

## 2024-12-05 NOTE — Telephone Encounter (Signed)
 Patient contacted and is sending updated transmission. Waiting to review and determine next steps regarding AF/Flutter.

## 2024-12-06 ENCOUNTER — Other Ambulatory Visit (HOSPITAL_COMMUNITY): Payer: Self-pay | Admitting: Internal Medicine

## 2024-12-06 ENCOUNTER — Telehealth: Payer: Self-pay

## 2024-12-06 ENCOUNTER — Ambulatory Visit (HOSPITAL_BASED_OUTPATIENT_CLINIC_OR_DEPARTMENT_OTHER)
Admission: RE | Admit: 2024-12-06 | Discharge: 2024-12-06 | Disposition: A | Source: Ambulatory Visit | Attending: Internal Medicine

## 2024-12-06 ENCOUNTER — Other Ambulatory Visit (HOSPITAL_BASED_OUTPATIENT_CLINIC_OR_DEPARTMENT_OTHER): Payer: Self-pay | Admitting: Internal Medicine

## 2024-12-06 DIAGNOSIS — R600 Localized edema: Secondary | ICD-10-CM | POA: Diagnosis present

## 2024-12-06 NOTE — Telephone Encounter (Signed)
 Returned back to NSR.

## 2024-12-06 NOTE — Telephone Encounter (Signed)
 Alert received from [employer] for   Spoke to pts daughter Chad [name]) who states patient has been very anxious about the winter storm possibly coming. Patient asymptomatic with any symptoms. Patient will send an updated transmission today to review presenting. Daughter is also coming to pick pt up from [facility] today to take back home with her since he lives alone and on oxygen . Will await transmission. Was appreciative of call.

## 2024-12-19 DIAGNOSIS — I5032 Chronic diastolic (congestive) heart failure: Secondary | ICD-10-CM | POA: Insufficient documentation

## 2024-12-19 NOTE — Progress Notes (Unsigned)
 " Cardiology Office Note:   Date:  12/20/2024  ID:  Chad [name], DOB May 02, 1929, MRN [id number] PCP: Valentin Skates, DO  [facility] Providers Cardiologist:  Lynwood Schilling, MD Electrophysiologist:  Will Gladis Norton, MD {  History of Present Illness:   Chad [name] is a 89 y.o. male who presents for follow up of atrial fib.   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  He has been treated for SVT as well by Dr. Fernande.    He is followed in the Atrial Fib clinic.  He is being treated with Tikosyn .   He has had subdural hematoma from a fall earlier in 2024.  He has done well since I last saw him.  The last head CT and this seems to be improved with smaller hematomas.     Recently discharged on 07/25/2023 for respiratory failure, and diastolic CHF.  Discharged home on O2 2/L , has been diuresed, sent home on lasix  20 mg BID. Continue CPAP at night.  The patient is now being followed by pulmonary palliative care.   Echocardiogram dated 07/24/2023 revealed poor windows with a very limited study, the EF was found to be normal.      Since I last saw him he actually in the last few days has had breathlessness and panic with this.  [facility].  Today when he was at a primary care appointment he realized that his concentrator was not working the entire time he was away.  He has an oxygen  tank today and with this he is back to baseline.  He has had some increased lower extremity swelling because he was not taking his Lasix  with travel.  He has not been having any PND or orthopnea.  He is not having any cough fevers or chills.  He is not having any chest pressure, neck or arm discomfort.  ROS: As stated in the HPI and negative for all other systems.  Studies Reviewed:    EKG:     ***  Risk Assessment/Calculations:    CHA2DS2-VASc Score = 3  {Confirm score is correct.  If not, click here to update score.  REFRESH note.  :1} This indicates  a 3.2% annual risk of stroke. The patient's score is based upon: CHF History: 0 HTN History: 1 Diabetes History: 0 Stroke History: 0 Vascular Disease History: 0 Age Score: 2 Gender Score: 0   Physical Exam:   VS:  BP 112/68   Pulse 66   Ht 5' 9 (1.753 m)   Wt 224 lb (101.6 kg)   SpO2 90%   BMI 33.08 kg/m    Wt Readings from Last 3 Encounters:  12/20/24 224 lb (101.6 kg)  11/27/24 226 lb (102.5 kg)  10/09/24 224 lb (101.6 kg)     GEN: Well nourished, well developed in no acute distress NECK: No JVD; No carotid bruits CARDIAC: RRR, no murmurs, rubs, gallops RESPIRATORY:  Clear to auscultation without rales, wheezing or rhonchi  ABDOMEN: Soft, non-tender, non-distended EXTREMITIES:  Moderate bilateral leg edema; No deformity   ASSESSMENT AND PLAN:   SOB:  This seems to be at baseline.  He does have some increased lower extremity swelling and this will be managed as below.    ATRIAL FIB: He tolerates Coumadin .  He seems to be in sinus rhythm.  I am going to check a magnesium  and his potassium.  He will continue on the Tikosyn .  HTN: Blood  pressure is at target.  No change in therapy.   HFpEF:   He seems to have a little excess volume and so I am going to give him an extra 20 mg of Lasix  for 2 days and then back to his twice daily 20 mg  SLEEP APNEA:    He tolerates CPAP.   DEVICE: The patient had his transmission reviewed the other day.  He had a flutter on January 4 with some short bursts since then prior to 122 interrogation.  He is going to be evaluated today to consider adjusting PVR for earlier mode switching.     Follow up ***  Signed, Lynwood Schilling, MD   "

## 2024-12-20 ENCOUNTER — Ambulatory Visit

## 2024-12-20 ENCOUNTER — Ambulatory Visit: Admitting: Cardiology

## 2024-12-20 VITALS — BP 112/68 | HR 66 | Ht 69.0 in | Wt 224.0 lb

## 2024-12-20 DIAGNOSIS — I4819 Other persistent atrial fibrillation: Secondary | ICD-10-CM

## 2024-12-20 DIAGNOSIS — I480 Paroxysmal atrial fibrillation: Secondary | ICD-10-CM

## 2024-12-20 DIAGNOSIS — J42 Unspecified chronic bronchitis: Secondary | ICD-10-CM | POA: Insufficient documentation

## 2024-12-20 DIAGNOSIS — J189 Pneumonia, unspecified organism: Secondary | ICD-10-CM | POA: Insufficient documentation

## 2024-12-20 DIAGNOSIS — Z5181 Encounter for therapeutic drug level monitoring: Secondary | ICD-10-CM

## 2024-12-20 DIAGNOSIS — I509 Heart failure, unspecified: Secondary | ICD-10-CM | POA: Insufficient documentation

## 2024-12-20 DIAGNOSIS — I5032 Chronic diastolic (congestive) heart failure: Secondary | ICD-10-CM

## 2024-12-20 DIAGNOSIS — I10 Essential (primary) hypertension: Secondary | ICD-10-CM

## 2024-12-20 LAB — POCT INR: INR: 2.5 (ref 2.0–3.0)

## 2024-12-20 NOTE — Patient Instructions (Signed)
Patient will follow up as scheduled.

## 2024-12-20 NOTE — Patient Instructions (Signed)
 Medication Instructions:  Take Lasix  20 mg 3 times daily for 2 days, then take 20 mg twice daily *If you need a refill on your cardiac medications before your next appointment, please call your pharmacy*  Lab Work: Potassium, Magnesium  at [facility] today If you have labs (blood work) drawn today and your tests are completely normal, you will receive your results only by: MyChart Message (if you have MyChart) OR A paper copy in the mail If you have any lab test that is abnormal or we need to change your treatment, we will call you to review the results.  Testing/Procedures: NONE  Follow-Up: At [facility], you and your health needs are our priority.  As part of our continuing mission to provide you with exceptional heart care, our providers are all part of one team.  This team includes your primary Cardiologist (physician) and Advanced Practice Providers or APPs (Physician Assistants and Nurse Practitioners) who all work together to provide you with the care you need, when you need it.  Your next appointment:   6 month(s)  Provider:   Lynwood Schilling, MD    We recommend signing up for the patient portal called MyChart.  Sign up information is provided on this After Visit Summary.  MyChart is used to connect with patients for Virtual Visits (Telemedicine).  Patients are able to view lab/test results, encounter notes, upcoming appointments, etc.  Non-urgent messages can be sent to your provider as well.   To learn more about what you can do with MyChart, go to [email or web address].

## 2024-12-20 NOTE — Progress Notes (Signed)
 Patient seen in device clinic today to test and determine if V to A retrograde conduction is present and adjust PVARP if necessary to allow for more accurate atrial diagnostics (AT/AF Burden)  Retrograde Conduction test performed with positive V to A conduction   VA interval measured at 237 mS today  PVARP currently programmed at 325 mS   PVARP re-programmed to nominal PVARP setting of 275 mS  Patient will follow up as scheduled  No charge for visit

## 2024-12-20 NOTE — Patient Instructions (Signed)
 Continue 1/2 tablet daily except 1 tablet on Thursdays Recheck in 5 weeks

## 2024-12-20 NOTE — Progress Notes (Signed)
 INR 2.5  Continue 1/2 tablet daily except 1 tablet on Thursdays Recheck in 5 weeks

## 2024-12-25 ENCOUNTER — Ambulatory Visit

## 2025-01-20 ENCOUNTER — Ambulatory Visit

## 2025-01-22 ENCOUNTER — Ambulatory Visit: Admitting: Neurology

## 2025-01-30 ENCOUNTER — Ambulatory Visit: Admitting: Podiatry

## 2025-05-19 ENCOUNTER — Ambulatory Visit: Admitting: Neurology
